# Patient Record
Sex: Female | Born: 1950
Health system: Southern US, Community
[De-identification: ages and names within clinical notes are randomized; demographics above are authoritative.]

## PROBLEM LIST (undated history)

## (undated) DIAGNOSIS — Z9289 Personal history of other medical treatment: Secondary | ICD-10-CM

## (undated) DIAGNOSIS — I10 Essential (primary) hypertension: Secondary | ICD-10-CM

## (undated) DIAGNOSIS — I82409 Acute embolism and thrombosis of unspecified deep veins of unspecified lower extremity: Principal | ICD-10-CM

## (undated) DIAGNOSIS — D6851 Activated protein C resistance: Secondary | ICD-10-CM

## (undated) DIAGNOSIS — K6389 Other specified diseases of intestine: Secondary | ICD-10-CM

## (undated) DIAGNOSIS — L500 Allergic urticaria: Secondary | ICD-10-CM

## (undated) DIAGNOSIS — IMO0002 Reserved for concepts with insufficient information to code with codable children: Secondary | ICD-10-CM

## (undated) DIAGNOSIS — L03116 Cellulitis of left lower limb: Secondary | ICD-10-CM

## (undated) DIAGNOSIS — D689 Coagulation defect, unspecified: Secondary | ICD-10-CM

## (undated) DIAGNOSIS — C49A0 Gastrointestinal stromal tumor, unspecified site: Secondary | ICD-10-CM

## (undated) DIAGNOSIS — E538 Deficiency of other specified B group vitamins: Secondary | ICD-10-CM

## (undated) DIAGNOSIS — R0600 Dyspnea, unspecified: Secondary | ICD-10-CM

## (undated) DIAGNOSIS — F419 Anxiety disorder, unspecified: Secondary | ICD-10-CM

## (undated) DIAGNOSIS — D510 Vitamin B12 deficiency anemia due to intrinsic factor deficiency: Secondary | ICD-10-CM

## (undated) DIAGNOSIS — I472 Ventricular tachycardia: Secondary | ICD-10-CM

## (undated) DIAGNOSIS — K219 Gastro-esophageal reflux disease without esophagitis: Secondary | ICD-10-CM

## (undated) HISTORY — DX: Coagulation defect, unspecified: D68.9

## (undated) HISTORY — DX: Deficiency of other specified B group vitamins: E53.8

## (undated) HISTORY — DX: Activated protein C resistance: D68.51

## (undated) HISTORY — DX: Gastrointestinal stromal tumor, unspecified site: C49.A0

## (undated) HISTORY — DX: Vitamin B12 deficiency anemia due to intrinsic factor deficiency: D51.0

## (undated) HISTORY — DX: Cellulitis of left lower limb: L03.116

## (undated) HISTORY — DX: Acute embolism and thrombosis of unspecified deep veins of unspecified lower extremity: I82.409

## (undated) HISTORY — DX: Reserved for concepts with insufficient information to code with codable children: IMO0002

---

## 1987-01-27 HISTORY — PX: ABDOMINAL HYSTERECTOMY: SHX81

## 2004-01-27 DIAGNOSIS — L03116 Cellulitis of left lower limb: Secondary | ICD-10-CM

## 2004-01-27 HISTORY — DX: Cellulitis of left lower limb: L03.116

## 2004-04-08 ENCOUNTER — Encounter: Admission: RE | Admit: 2004-04-08 | Discharge: 2004-04-08 | Payer: Self-pay | Admitting: Oncology

## 2004-04-08 ENCOUNTER — Encounter (HOSPITAL_COMMUNITY): Admission: RE | Admit: 2004-04-08 | Discharge: 2004-05-08 | Payer: Self-pay | Admitting: Oncology

## 2004-04-08 ENCOUNTER — Ambulatory Visit (HOSPITAL_COMMUNITY): Payer: Self-pay | Admitting: Oncology

## 2004-05-01 ENCOUNTER — Observation Stay (HOSPITAL_COMMUNITY): Admission: RE | Admit: 2004-05-01 | Discharge: 2004-05-02 | Payer: Self-pay | Admitting: Family Medicine

## 2004-05-16 ENCOUNTER — Encounter (HOSPITAL_COMMUNITY): Admission: RE | Admit: 2004-05-16 | Discharge: 2004-06-15 | Payer: Self-pay | Admitting: Oncology

## 2004-05-16 ENCOUNTER — Encounter: Admission: RE | Admit: 2004-05-16 | Discharge: 2004-05-16 | Payer: Self-pay | Admitting: Oncology

## 2004-05-30 ENCOUNTER — Ambulatory Visit (HOSPITAL_COMMUNITY): Payer: Self-pay | Admitting: Oncology

## 2004-06-19 ENCOUNTER — Encounter (HOSPITAL_COMMUNITY): Admission: RE | Admit: 2004-06-19 | Discharge: 2004-07-19 | Payer: Self-pay | Admitting: Oncology

## 2004-06-19 ENCOUNTER — Encounter: Admission: RE | Admit: 2004-06-19 | Discharge: 2004-06-19 | Payer: Self-pay | Admitting: Oncology

## 2004-07-15 ENCOUNTER — Emergency Department (HOSPITAL_COMMUNITY): Admission: EM | Admit: 2004-07-15 | Discharge: 2004-07-16 | Payer: Self-pay | Admitting: *Deleted

## 2004-07-23 ENCOUNTER — Encounter (HOSPITAL_COMMUNITY): Admission: RE | Admit: 2004-07-23 | Discharge: 2004-08-22 | Payer: Self-pay | Admitting: Oncology

## 2004-07-23 ENCOUNTER — Ambulatory Visit (HOSPITAL_COMMUNITY): Payer: Self-pay | Admitting: Oncology

## 2004-07-23 ENCOUNTER — Encounter: Admission: RE | Admit: 2004-07-23 | Discharge: 2004-07-23 | Payer: Self-pay | Admitting: Oncology

## 2004-08-27 ENCOUNTER — Encounter (HOSPITAL_COMMUNITY): Admission: RE | Admit: 2004-08-27 | Discharge: 2004-09-26 | Payer: Self-pay | Admitting: Oncology

## 2004-08-27 ENCOUNTER — Encounter: Admission: RE | Admit: 2004-08-27 | Discharge: 2004-08-27 | Payer: Self-pay | Admitting: Oncology

## 2004-09-09 ENCOUNTER — Ambulatory Visit (HOSPITAL_COMMUNITY): Payer: Self-pay | Admitting: Oncology

## 2004-10-01 ENCOUNTER — Encounter: Admission: RE | Admit: 2004-10-01 | Discharge: 2004-10-25 | Payer: Self-pay | Admitting: Oncology

## 2004-10-01 ENCOUNTER — Encounter (HOSPITAL_COMMUNITY): Admission: RE | Admit: 2004-10-01 | Discharge: 2004-10-25 | Payer: Self-pay | Admitting: Oncology

## 2004-10-27 ENCOUNTER — Emergency Department (HOSPITAL_COMMUNITY): Admission: EM | Admit: 2004-10-27 | Discharge: 2004-10-27 | Payer: Self-pay | Admitting: Emergency Medicine

## 2004-10-28 ENCOUNTER — Inpatient Hospital Stay (HOSPITAL_COMMUNITY): Admission: EM | Admit: 2004-10-28 | Discharge: 2004-11-01 | Payer: Self-pay | Admitting: Emergency Medicine

## 2004-11-07 ENCOUNTER — Ambulatory Visit (HOSPITAL_COMMUNITY): Payer: Self-pay | Admitting: Oncology

## 2004-11-07 ENCOUNTER — Encounter (HOSPITAL_COMMUNITY): Admission: RE | Admit: 2004-11-07 | Discharge: 2004-12-07 | Payer: Self-pay | Admitting: Oncology

## 2004-11-07 ENCOUNTER — Encounter: Admission: RE | Admit: 2004-11-07 | Discharge: 2004-11-07 | Payer: Self-pay | Admitting: Oncology

## 2004-12-08 ENCOUNTER — Encounter (HOSPITAL_COMMUNITY): Admission: RE | Admit: 2004-12-08 | Discharge: 2005-01-07 | Payer: Self-pay | Admitting: Oncology

## 2004-12-08 ENCOUNTER — Encounter: Admission: RE | Admit: 2004-12-08 | Discharge: 2004-12-08 | Payer: Self-pay | Admitting: Oncology

## 2004-12-31 ENCOUNTER — Ambulatory Visit (HOSPITAL_COMMUNITY): Payer: Self-pay | Admitting: Oncology

## 2005-01-30 ENCOUNTER — Encounter (HOSPITAL_COMMUNITY): Admission: RE | Admit: 2005-01-30 | Discharge: 2005-03-01 | Payer: Self-pay | Admitting: Oncology

## 2005-01-30 ENCOUNTER — Encounter: Admission: RE | Admit: 2005-01-30 | Discharge: 2005-01-30 | Payer: Self-pay | Admitting: Oncology

## 2005-03-05 ENCOUNTER — Ambulatory Visit (HOSPITAL_COMMUNITY): Payer: Self-pay | Admitting: Oncology

## 2005-03-05 ENCOUNTER — Encounter: Admission: RE | Admit: 2005-03-05 | Discharge: 2005-03-05 | Payer: Self-pay | Admitting: Oncology

## 2005-03-05 ENCOUNTER — Encounter (HOSPITAL_COMMUNITY): Admission: RE | Admit: 2005-03-05 | Discharge: 2005-04-04 | Payer: Self-pay | Admitting: Oncology

## 2005-05-18 ENCOUNTER — Encounter (HOSPITAL_COMMUNITY): Admission: RE | Admit: 2005-05-18 | Discharge: 2005-06-17 | Payer: Self-pay | Admitting: Oncology

## 2005-05-18 ENCOUNTER — Encounter: Admission: RE | Admit: 2005-05-18 | Discharge: 2005-05-18 | Payer: Self-pay | Admitting: Oncology

## 2005-05-25 ENCOUNTER — Ambulatory Visit (HOSPITAL_COMMUNITY): Payer: Self-pay | Admitting: Oncology

## 2005-07-06 ENCOUNTER — Encounter: Admission: RE | Admit: 2005-07-06 | Discharge: 2005-07-06 | Payer: Self-pay | Admitting: Oncology

## 2005-07-06 ENCOUNTER — Encounter (HOSPITAL_COMMUNITY): Admission: RE | Admit: 2005-07-06 | Discharge: 2005-08-05 | Payer: Self-pay | Admitting: Oncology

## 2005-08-07 ENCOUNTER — Encounter (HOSPITAL_COMMUNITY): Admission: RE | Admit: 2005-08-07 | Discharge: 2005-09-06 | Payer: Self-pay | Admitting: Oncology

## 2005-08-07 ENCOUNTER — Encounter: Admission: RE | Admit: 2005-08-07 | Discharge: 2005-08-07 | Payer: Self-pay | Admitting: Oncology

## 2005-08-07 ENCOUNTER — Ambulatory Visit (HOSPITAL_COMMUNITY): Payer: Self-pay | Admitting: Oncology

## 2005-09-04 ENCOUNTER — Ambulatory Visit (HOSPITAL_COMMUNITY): Payer: Self-pay | Admitting: Oncology

## 2005-09-08 ENCOUNTER — Encounter: Admission: RE | Admit: 2005-09-08 | Discharge: 2005-09-08 | Payer: Self-pay | Admitting: Oncology

## 2005-09-08 ENCOUNTER — Encounter (HOSPITAL_COMMUNITY): Admission: RE | Admit: 2005-09-08 | Discharge: 2005-10-08 | Payer: Self-pay | Admitting: Oncology

## 2005-11-04 ENCOUNTER — Encounter: Admission: RE | Admit: 2005-11-04 | Discharge: 2005-11-04 | Payer: Self-pay | Admitting: Oncology

## 2005-11-04 ENCOUNTER — Ambulatory Visit (HOSPITAL_COMMUNITY): Payer: Self-pay | Admitting: Oncology

## 2005-11-04 ENCOUNTER — Encounter (HOSPITAL_COMMUNITY): Admission: RE | Admit: 2005-11-04 | Discharge: 2005-12-04 | Payer: Self-pay | Admitting: Oncology

## 2006-02-26 ENCOUNTER — Ambulatory Visit (HOSPITAL_COMMUNITY): Payer: Self-pay | Admitting: Oncology

## 2006-02-26 ENCOUNTER — Encounter (HOSPITAL_COMMUNITY): Admission: RE | Admit: 2006-02-26 | Discharge: 2006-03-28 | Payer: Self-pay | Admitting: Oncology

## 2006-08-25 ENCOUNTER — Ambulatory Visit (HOSPITAL_COMMUNITY): Payer: Self-pay | Admitting: Oncology

## 2006-08-25 ENCOUNTER — Encounter (HOSPITAL_COMMUNITY): Admission: RE | Admit: 2006-08-25 | Discharge: 2006-09-24 | Payer: Self-pay | Admitting: Oncology

## 2006-10-05 ENCOUNTER — Encounter (HOSPITAL_COMMUNITY): Admission: RE | Admit: 2006-10-05 | Discharge: 2006-10-26 | Payer: Self-pay | Admitting: Oncology

## 2006-10-18 ENCOUNTER — Ambulatory Visit (HOSPITAL_COMMUNITY): Payer: Self-pay | Admitting: Oncology

## 2006-11-05 ENCOUNTER — Encounter (HOSPITAL_COMMUNITY): Admission: RE | Admit: 2006-11-05 | Discharge: 2006-12-05 | Payer: Self-pay | Admitting: Oncology

## 2006-12-07 ENCOUNTER — Ambulatory Visit (HOSPITAL_COMMUNITY): Payer: Self-pay | Admitting: Oncology

## 2006-12-07 ENCOUNTER — Encounter (HOSPITAL_COMMUNITY): Admission: RE | Admit: 2006-12-07 | Discharge: 2007-01-06 | Payer: Self-pay | Admitting: Oncology

## 2007-05-20 ENCOUNTER — Emergency Department (HOSPITAL_COMMUNITY): Admission: EM | Admit: 2007-05-20 | Discharge: 2007-05-20 | Payer: Self-pay | Admitting: Emergency Medicine

## 2009-05-20 ENCOUNTER — Encounter (HOSPITAL_COMMUNITY): Admission: RE | Admit: 2009-05-20 | Discharge: 2009-06-19 | Payer: Self-pay | Admitting: Oncology

## 2009-05-20 ENCOUNTER — Ambulatory Visit (HOSPITAL_COMMUNITY): Payer: Self-pay | Admitting: Oncology

## 2009-05-26 DIAGNOSIS — IMO0002 Reserved for concepts with insufficient information to code with codable children: Secondary | ICD-10-CM

## 2009-05-26 HISTORY — DX: Reserved for concepts with insufficient information to code with codable children: IMO0002

## 2009-06-07 ENCOUNTER — Ambulatory Visit: Payer: Self-pay | Admitting: Internal Medicine

## 2009-06-07 ENCOUNTER — Inpatient Hospital Stay (HOSPITAL_COMMUNITY): Admission: EM | Admit: 2009-06-07 | Discharge: 2009-06-10 | Payer: Self-pay | Admitting: Emergency Medicine

## 2009-06-08 ENCOUNTER — Ambulatory Visit: Payer: Self-pay | Admitting: Internal Medicine

## 2009-06-11 ENCOUNTER — Telehealth (INDEPENDENT_AMBULATORY_CARE_PROVIDER_SITE_OTHER): Payer: Self-pay

## 2009-06-11 ENCOUNTER — Encounter: Payer: Self-pay | Admitting: Internal Medicine

## 2009-06-11 DIAGNOSIS — K922 Gastrointestinal hemorrhage, unspecified: Secondary | ICD-10-CM | POA: Insufficient documentation

## 2009-06-12 ENCOUNTER — Encounter: Payer: Self-pay | Admitting: Internal Medicine

## 2009-06-12 ENCOUNTER — Telehealth (INDEPENDENT_AMBULATORY_CARE_PROVIDER_SITE_OTHER): Payer: Self-pay

## 2009-06-13 ENCOUNTER — Encounter (INDEPENDENT_AMBULATORY_CARE_PROVIDER_SITE_OTHER): Payer: Self-pay

## 2009-06-17 ENCOUNTER — Telehealth (INDEPENDENT_AMBULATORY_CARE_PROVIDER_SITE_OTHER): Payer: Self-pay

## 2009-06-18 ENCOUNTER — Encounter: Payer: Self-pay | Admitting: Internal Medicine

## 2009-06-18 ENCOUNTER — Telehealth (INDEPENDENT_AMBULATORY_CARE_PROVIDER_SITE_OTHER): Payer: Self-pay

## 2009-06-19 ENCOUNTER — Encounter: Payer: Self-pay | Admitting: Internal Medicine

## 2009-06-20 ENCOUNTER — Encounter: Payer: Self-pay | Admitting: Internal Medicine

## 2009-06-20 LAB — CONVERTED CEMR LAB
Basophils Absolute: 0 10*3/uL (ref 0.0–0.1)
Basophils Relative: 0 % (ref 0–1)
Eosinophils Absolute: 0.2 10*3/uL (ref 0.0–0.7)
Eosinophils Relative: 3 % (ref 0–5)
HCT: 31.3 % — ABNORMAL LOW (ref 36.0–46.0)
Hemoglobin: 9.5 g/dL — ABNORMAL LOW (ref 12.0–15.0)
Lymphocytes Relative: 34 % (ref 12–46)
Lymphs Abs: 2.2 10*3/uL (ref 0.7–4.0)
MCHC: 30.4 g/dL (ref 30.0–36.0)
MCV: 86.7 fL (ref 78.0–100.0)
Monocytes Absolute: 0.6 10*3/uL (ref 0.1–1.0)
Monocytes Relative: 9 % (ref 3–12)
Neutro Abs: 3.4 10*3/uL (ref 1.7–7.7)
Neutrophils Relative %: 53 % (ref 43–77)
Platelets: 407 10*3/uL — ABNORMAL HIGH (ref 150–400)
RBC: 3.61 M/uL — ABNORMAL LOW (ref 3.87–5.11)
RDW: 14.1 % (ref 11.5–15.5)
WBC: 6.3 10*3/uL (ref 4.0–10.5)

## 2009-06-27 ENCOUNTER — Encounter (INDEPENDENT_AMBULATORY_CARE_PROVIDER_SITE_OTHER): Payer: Self-pay

## 2009-07-02 ENCOUNTER — Encounter (HOSPITAL_COMMUNITY): Admission: RE | Admit: 2009-07-02 | Discharge: 2009-08-01 | Payer: Self-pay | Admitting: Oncology

## 2009-07-05 ENCOUNTER — Ambulatory Visit: Payer: Self-pay | Admitting: Internal Medicine

## 2009-07-05 DIAGNOSIS — K210 Gastro-esophageal reflux disease with esophagitis: Secondary | ICD-10-CM

## 2009-07-08 ENCOUNTER — Ambulatory Visit: Payer: Self-pay | Admitting: Internal Medicine

## 2009-07-12 ENCOUNTER — Ambulatory Visit (HOSPITAL_COMMUNITY): Payer: Self-pay | Admitting: Oncology

## 2009-07-15 ENCOUNTER — Encounter: Payer: Self-pay | Admitting: Internal Medicine

## 2009-07-16 ENCOUNTER — Encounter: Payer: Self-pay | Admitting: Internal Medicine

## 2009-07-18 ENCOUNTER — Encounter (INDEPENDENT_AMBULATORY_CARE_PROVIDER_SITE_OTHER): Payer: Self-pay

## 2009-08-06 ENCOUNTER — Encounter: Payer: Self-pay | Admitting: Internal Medicine

## 2009-08-15 ENCOUNTER — Encounter (HOSPITAL_COMMUNITY): Admission: RE | Admit: 2009-08-15 | Discharge: 2009-09-14 | Payer: Self-pay | Admitting: Oncology

## 2009-08-27 ENCOUNTER — Ambulatory Visit: Payer: Self-pay | Admitting: Internal Medicine

## 2009-08-27 DIAGNOSIS — R635 Abnormal weight gain: Secondary | ICD-10-CM | POA: Insufficient documentation

## 2009-08-27 DIAGNOSIS — D682 Hereditary deficiency of other clotting factors: Secondary | ICD-10-CM

## 2009-08-27 DIAGNOSIS — D509 Iron deficiency anemia, unspecified: Secondary | ICD-10-CM

## 2009-09-02 ENCOUNTER — Ambulatory Visit (HOSPITAL_COMMUNITY): Payer: Self-pay | Admitting: Oncology

## 2009-09-02 LAB — CONVERTED CEMR LAB
HCT: 37.8 % (ref 36.0–46.0)
Hemoglobin: 11.5 g/dL — ABNORMAL LOW (ref 12.0–15.0)
TSH: 1.138 microintl units/mL (ref 0.350–4.500)
Tissue Transglutaminase Ab, IgA: 23.6 units — ABNORMAL HIGH (ref <20)

## 2009-09-16 ENCOUNTER — Encounter (HOSPITAL_COMMUNITY): Admission: RE | Admit: 2009-09-16 | Discharge: 2009-10-16 | Payer: Self-pay | Admitting: Oncology

## 2009-09-20 ENCOUNTER — Telehealth: Payer: Self-pay | Admitting: Internal Medicine

## 2009-09-20 ENCOUNTER — Encounter: Payer: Self-pay | Admitting: Internal Medicine

## 2009-10-17 ENCOUNTER — Encounter (HOSPITAL_COMMUNITY): Admission: RE | Admit: 2009-10-17 | Discharge: 2009-10-25 | Payer: Self-pay | Admitting: Oncology

## 2009-10-30 ENCOUNTER — Ambulatory Visit (HOSPITAL_COMMUNITY): Payer: Self-pay | Admitting: Oncology

## 2009-10-30 ENCOUNTER — Encounter (HOSPITAL_COMMUNITY)
Admission: RE | Admit: 2009-10-30 | Discharge: 2009-11-29 | Payer: Self-pay | Source: Home / Self Care | Admitting: Oncology

## 2009-12-10 ENCOUNTER — Encounter (HOSPITAL_COMMUNITY)
Admission: RE | Admit: 2009-12-10 | Discharge: 2010-01-09 | Payer: Self-pay | Source: Home / Self Care | Attending: Oncology | Admitting: Oncology

## 2009-12-24 ENCOUNTER — Ambulatory Visit (HOSPITAL_COMMUNITY): Payer: Self-pay | Admitting: Oncology

## 2010-01-15 ENCOUNTER — Encounter (HOSPITAL_COMMUNITY)
Admission: RE | Admit: 2010-01-15 | Discharge: 2010-02-14 | Payer: Self-pay | Source: Home / Self Care | Attending: Oncology | Admitting: Oncology

## 2010-02-06 ENCOUNTER — Encounter: Payer: Self-pay | Admitting: Orthopedic Surgery

## 2010-02-10 LAB — PROTIME-INR
INR: 2.1 — ABNORMAL HIGH (ref 0.00–1.49)
Prothrombin Time: 23.7 seconds — ABNORMAL HIGH (ref 11.6–15.2)

## 2010-02-16 ENCOUNTER — Encounter (HOSPITAL_COMMUNITY): Payer: Self-pay | Admitting: Oncology

## 2010-02-19 ENCOUNTER — Emergency Department (HOSPITAL_COMMUNITY)
Admission: EM | Admit: 2010-02-19 | Discharge: 2010-02-19 | Payer: Self-pay | Source: Home / Self Care | Admitting: Emergency Medicine

## 2010-02-25 ENCOUNTER — Encounter: Payer: Self-pay | Admitting: Orthopedic Surgery

## 2010-02-25 ENCOUNTER — Ambulatory Visit (HOSPITAL_COMMUNITY)
Admission: RE | Admit: 2010-02-25 | Discharge: 2010-02-25 | Payer: Self-pay | Source: Home / Self Care | Attending: Oncology | Admitting: Oncology

## 2010-02-25 ENCOUNTER — Ambulatory Visit
Admission: RE | Admit: 2010-02-25 | Discharge: 2010-02-25 | Payer: Self-pay | Source: Home / Self Care | Attending: Orthopedic Surgery | Admitting: Orthopedic Surgery

## 2010-02-25 DIAGNOSIS — S40029A Contusion of unspecified upper arm, initial encounter: Secondary | ICD-10-CM | POA: Insufficient documentation

## 2010-02-25 LAB — PROTIME-INR
INR: 2.62 — ABNORMAL HIGH (ref 0.00–1.49)
Prothrombin Time: 28.1 seconds — ABNORMAL HIGH (ref 11.6–15.2)

## 2010-02-25 NOTE — Miscellaneous (Signed)
Summary: Orders Update  Clinical Lists Changes  Orders: Added new Test order of T-CBC w/Diff ([id number]) - Signed 

## 2010-02-25 NOTE — Progress Notes (Signed)
----   Converted from flag ---- ---- 09/20/2009 2:58 PM, Diana Eves wrote: Dr Thornton Papas office had [facility] for [place] to cancel Naja Trentham's TCS for Monday and that Dr Mariel Sleet needed to talk with you ASAP regarding patient. You can reach him at [phone] ------------------------------

## 2010-02-25 NOTE — Progress Notes (Signed)
Summary: pt weak and tired  Phone Note Call from Patient Call back at [facility] Phone [phone]   Caller: Patient Summary of Call: call from Dr. Arnell Asal office- pt called them c/o feeling weak and tired. Called pt- she restarted coumadin on Saturday. She has not noticed any blood or black stools, no pain, no fever. pt stated she gets up and after about 2 hours has to lay down again. pt is worried that hemoglobin is low. please advise. ( FYI-So far she is doing great with pepcid, no allergic reaction) Initial call taken by: Hendricks Limes LPN,  Jun 18, 2009 11:06 AM     Appended Document: pt weak and tired symptoms nonspecific; would be reasonable to do a cbc today  Appended Document: pt weak and tired tried to call pt- #busy. lab order faxed to lab  Appended Document: pt weak and tired called pt- LMOM  Appended Document: pt weak and tired tried to call pt- #busy  Appended Document: pt weak and tired pt aware. cbc in emr
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
## 2010-02-25 NOTE — Assessment & Plan Note (Signed)
Summary: DROPPED OFF STOOL/SS  pt returned ifobt and it was negative  Allergies: 1)  ! Sulfa 2)  ! Penicillin 3)  ! Keflex 4)  ! * Latex 5)  ! * Adhesive 6)  ! Pantoprazole Sodium 7)  ! * Dexilant  Other Orders: Immuno-chemical Fecal Occult (16109)

## 2010-02-25 NOTE — Consult Note (Signed)
Summary: Consultation Report  Consultation Report   Imported By: Peggyann Shoals 06/12/2009 11:17:39  _____________________________________________________________________  External Attachment:    Type:   Image     Comment:   External Document

## 2010-02-25 NOTE — Letter (Signed)
Summary: TCS/EGD ORDERS  TCS/EGD ORDERS   Imported By: Rosine Beat 09/20/2009 12:45:03  _____________________________________________________________________  External Attachment:    Type:   Image     Comment:   External Document

## 2010-02-25 NOTE — Assessment & Plan Note (Signed)
Summary: follow up from EGD done in hospital- cdg   Visit Type:  Follow-up Visit Primary Care Provider:  Belva Agee  Chief Complaint:  F/U EGD.  History of Present Illness: 60 year old lady recently admitted with a GI bleed manifested as melena. She had reflux esophagitis with an ulcer in distal esophagus felt to be at least, in part, responsible for the bleed. Hemoglobin has been slow to rebound on Niferex. I understand was 9.4 through  Dr. Thornton Papas office recently. She is not having any more melena or hematochezia; she is chronically fatigued. She had a 6 unit bleed in [place], [place] 2006 underwent multiple colonoscopies without a lesion being found. She is back on Coumadin.  She is now craving ice.  Interestingly, she has developed an allergic reaction to multiple PPI  given to treat reflux. PPIs are basically off limits now. She is on Pepcid 20 mg orally twice daily; she is not having any reflux symptoms. She had a prominent Schatzki's ring which warrantts dilation at some point in the future although she's not having any dysphagia currently.    Of note, her serum IgA level came back normal while she was in the hospital. I do not have the celiac panel available for review at this time.    Current Medications (verified): 1)  Niferex 150 Mg .... Take 1 Tablet By Mouth Two Times A Day 2)  Coumadin 7.5 Mg Tabs (Warfarin Sodium) .... One Tablet Daily Mon-Fri 3)  Coumadin 5 Mg Tabs (Warfarin Sodium) .... One Tablet On Sat and Sun 4)  Folic Acid 1 Mg Tabs (Folic Acid) .... Take 1 Tablet By Mouth Once A Day 5)  Alprazolam 0.25 Mg Tabs (Alprazolam) .... Take 1 Tablet By Mouth Once A Day 6)  B12 Injection .... Once Monthly 7)  Pepcid 20 Mg Tabs (Famotidine) .... Take 1 Tablet By Mouth Two Times A Day  Allergies (verified): 1)  ! Sulfa 2)  ! Penicillin 3)  ! Keflex 4)  ! * Latex 5)  ! * Adhesive 6)  ! Pantoprazole Sodium 7)  ! * Dexilant  Past History:  Family History: Last  updated: 07/05/2009 Father: Living age 30  healthy Mother: Deceased age 10  Parkinson's Siblings: 4      healthy  Social History: Last updated: 07/05/2009 Marital Status: Married Children: 3 Occupation: Art gallery manager  Past Medical History: Blood clots in left leg (when had hysterectomy) Factor Five Anxiety Reflux Hiatal Hernia  Past Surgical History: Hysterectomy  Family History: Father: Living age 70  healthy Mother: Deceased age 8  Parkinson's Siblings: 4      healthy  Social History: Marital Status: Married Children: 3 Occupation: Art gallery manager  Vital Signs:  Patient profile:   59 year old female Height:      67.5 inches Weight:      242 pounds BMI:     37.48 Temp:     97.8 degrees F oral Pulse rate:   72 / minute BP sitting:   142 / 82  (left arm) Cuff size:   regular  Vitals Entered By: Cloria Spring LPN (July 05, 2009 2:00 PM)  Physical Exam  General:  alert pleasant lady in no acute distress Eyes:  and gingiva paleness no scleral icterus Lungs:  clear to auscultation Heart:  regular rate and rhythm without murmur gallop rub Abdomen:  non-distended, soft, nontender without mass or hepatosplenomegaly  Impression & Recommendations: Impression: Pleasant 60 year old lady chronically anticoagulated because of factor 5 Leiden deficiency  admitted with GI bleed recently. She had ulcerative reflux esophagitis. Her hemoglobin has been slow to rebound. It's been 5 years since she had her colon imaged. She's never had her small bowel imaged. Of note, she was taking nonsteroidal agents up until the time of her bleed recently which may have been a contributing factor. She could have a lesion in her colon or small bowel contributing to the clinical picture.  Recommendations: Continue Pepcid 20 mg orally twice daily indefinitely  We'll send her home with one immunofecal occult blood test kit; if stool is  positive, we will pursue colonoscopy right away. It's  negative we'll see her back and see how her hemoglobin is doing over the next one month. Frankly, I have a low threshold for checking her colon since his been 5 years since that was done. She may ultimately end of the capsule study of her small bowel as well.  Review celiac panel.  Appended Document: follow up from EGD done in hospital- cdg stool negative for blood now; pt needs cbc and ov w extender in about 1 month from now to set up tcs  Appended Document: follow up from EGD done in hospital- cdg pt aware, lab order on file  Appended Document: Orders Update    Clinical Lists Changes  Problems: Added new problem of REFLUX ESOPHAGITIS (ICD-530.11) Orders: Added new Service order of Est. Patient Level III (16109) - Signed      Appended Document: follow up from EGD done in hospital- cdg reminder in computer

## 2010-02-25 NOTE — Miscellaneous (Signed)
Summary: op note  Clinical Lists Changes NAMESHLOKA, [name]               ACCOUNT NO.:  [id number]      MEDICAL RECORD NO.:  [id number]          PATIENT TYPE:  INP      LOCATION:  A304                          FACILITY:  [facility]      PHYSICIAN:  R. Roetta Sessions, M.D. DATE OF BIRTH:  08-Jul-1950      DATE OF PROCEDURE:  06/07/2009   DATE OF DISCHARGE:                                  OPERATIVE REPORT      PROCEDURE:  Diagnostic esophagogastroduodenoscopy.      INDICATIONS FOR PROCEDURE:  This is a very pleasant 60 year old lady   admitted to the hospital with melena and a precipitous drop in   hemoglobin in the setting of Advil use and anticoagulation with   Coumadin.  Overnight, she has remained hemodynamically stable.      Hemoglobin dropped from 10.8 to 9.4 overnight; it was 13.1 on May 20, 2009.  She has remained hemodynamically stable.  She was given FFP last   night and developed some problems felt to be a transfusion reaction and   the transfusion was stopped after approximately one-half of 1 unit was   administered this morning.  However, INR is at 1.48.  She remains   hemodynamically stable.  It is notable that this lady has a long history   of intermittent esophageal dysphagia to solids.  I told this nice lady I   may or may not be able to dilate her esophagus today.  The risks,   benefits, alternatives, limitations and imponderables have been   discussed and questions answered.  Please see the documentation in the   medical record.      PROCEDURE NOTE:  O2 saturation, blood pressure, pulse and respirations   were monitored throughout the entire procedure.      CONSCIOUS SEDATION:  Versed 3 mg IV and Demerol 50 mg IV in divided   doses.      INSTRUMENT:  Pentax video chip system.      Cetacaine spray for topical pharyngeal anesthesia.      FINDINGS:  Examination of the tubular esophagus revealed a single,   large, V-shaped erosion coming up from the GE junction.   This was   superimposed on a beefy Schatzki's ring.      In the central portion of the area of erosion, there was ulceration and   excavation of the mucosa, consistent with a small ulcer straddling the   GE junction directly at the level of the ring.  The ulcer had a clean   base.  Please see photos.  The EG junction was easily traversed, noting   the stomach .  Gastric cavity was empty and insufflated well with air.   A thorough examination of the gastric mucosa including retroflexion of   the proximal stomach and esophagogastric junction demonstrated a   moderate-sized hiatal hernia.  Again, the stomach was empty.  The mucosa   was well seen and it appeared otherwise normal.  The pylorus was patent   and easily traversed .  Examination of the bulb second and third portion   revealed no abnormalities.      Therapeutic/diagnostic maneuver performed:  None.      The patient tolerated the procedure well and was reactive after   endoscopy.      IMPRESSION:   1. A relatively large area of distal esophageal erosion with a central       area of ulceration, consistent with ulcerative/erosive reflux       esophagitis (this area could have bled to the point of producing       melena recently).   2. A thick, beefy Schatzki's ring, not manipulated today.   3. A moderate-sized hiatal hernia, empty stomach, normal gastric       mucosa patent pylorus, normal D1-D3.      RECOMMENDATIONS:   1. A clear liquid diet, advance to a soft diet over the next 24 hours.   2. Continue on a b.i.d. proton pump inhibitor.   3. I would avoid all NSAIDs from here on out.   4. Hold Coumadin for 7 days and then after 7 days, I would cautiously       resume this agent as the benefits would outweigh the risk at that       point.   5. We will follow H and H over the next 24 hours.   6. I have recommended that this nice lady come back at a later date       for an elective EGD with dilation of her Schatzki's ring  (with       Coumadin therapy interrupted at that time).               Jonathon Bellows, M.D.            RMR/MEDQ  D:  06/08/2009  T:  06/08/2009  Job:  [id number]      cc:   [facility]   [place], [place]      Minerva Areola S. Mariel Sleet, MD   Fax: [phone]      Hospitalist Service

## 2010-02-25 NOTE — Progress Notes (Signed)
----   Converted from flag ---- ---- 06/11/2009 10:02 AM, Diana Eves wrote:   ---- 06/11/2009 9:43 AM, Jonathon Bellows MD, Caleen Essex wrote: lets get a cbc end of next week - and if getting better, can cancel 6/10 and go w 3 mos  ---- 06/11/2009 9:30 AM, Diana Eves wrote: pt has appt on 6/10 @3pm  w/RMR...should I cancel this and make it in 3 months w/extender?  ---- 06/11/2009 7:35 AM, R. Roetta Sessions MD, Caleen Essex wrote: needs 3 mo f/u w extender to f/u dysphagia ------------------------------  Appended Document:  Pt informed. Lab order mailed.

## 2010-02-25 NOTE — Miscellaneous (Signed)
Summary: Orders Update  Clinical Lists Changes  Orders: Added new Test order of T-Celiac Disease Ab Evaluation (8002) - Signed 

## 2010-02-25 NOTE — Miscellaneous (Signed)
Summary: Orders Update  Clinical Lists Changes  Problems: Added new problem of GI BLEEDING (ICD-578.9) Orders: Added new Test order of T-CBC w/Diff [phone]) - Signed

## 2010-02-25 NOTE — Letter (Signed)
Summary: Recall, Labs Needed  [facility]  [address]   [place], [place] [address]   Phone: [phone]  Fax: [phone]    July 18, 2009  [name] [address] [place], [place]  [address] [birth year]   Dear [name],   Our records indicate it is time to repeat your blood work.  You can take the enclosed form to the lab on or near the date indicated.  Please make note of the new location of the lab:   [address], 2nd floor   [facility]  Our office will call you within a week to ten business days with the results.  If you do not hear from [place] in 10 business days, you should call the office.  If you have any questions regarding this, call the office at [phone], and ask for the nurse.  Labs are due on 08/21/2009.   Sincerely,    Hendricks Limes LPN  [facility] Ph: [phone]   Fax: [phone]

## 2010-02-25 NOTE — Progress Notes (Signed)
Summary: phone note/ ??allergic reaction to Pantoprazole  Phone Note Call from Patient   Caller: PT'S DAUGHTER IN LAW/ AMY Summary of Call: Pt's daughter-in-law called and said Dr. Jena Gauss saw pt in hospital and prescribed Pantoprazole 40 mg two times a day. She feels pt is having allergic reaction to the medication, she is itching all over and red above her eye lids. This is the only new medication she is on. Please advise. (Uses [facility] in [place]). Initial call taken by: Cloria Spring LPN,  Jun 12, 2009 3:32 PM     Appended Document: phone note/ ??allergic reaction to Pantoprazole stop pantoprazole and begin Prevacid 30 mg daily (disp 30) w 3 refills; use benadryl as needed itching.; call if no better in next 24 hours  Appended Document: phone note/ ??allergic reaction to Pantoprazole Pt informed.  Appended Document: phone note/ ??allergic reaction to Pantoprazole Rx called to [facility], to [facility].  Appended Document: phone note/ ??allergic reaction to Pantoprazole Pt called back and said the pharmacist said the Prevacid is in the same family as the  Pantoprazole, and she is afraid to take it since she had such a bad time last night  with itching. Please advise!  Appended Document: phone note/ ??allergic reaction to Pantoprazole Per Crystal, pt called and was very upset that she has not received a call back about her medication, and that she has an appt here in June but why would she want to come and  see someone who cares nothing for her health, that she has told us what she is allergic to.  Crystal was going to transfer call to me and pt hung up. Please advise on the above info.  Appended Document: phone note/ ??allergic reaction to Pantoprazole spoke w our pharmacist, benny, allergic reacions can occur w ppi's; however, changing to another agent in same class often ok; he recommended Dexilant 60 mg once daily - let give her 2 weeks worth of free samples - warn she  may have a reaction but may well be Ok - this calass of meds is best optiion for her problem=- lets call her in 2 days to check on her progress.  Appended Document: phone note/ ??allergic reaction to Pantoprazole Pt informed. Said she could not get here to pick up samples, she would like it called in to [facility] in [place]. Rx called to C-C at the pharmacy. (Pt did say that her itching is some better, her eyes are swollen but not quiet as bad, she said  she is almost afriad to take anything, but she wanted Rx called in. She should be at work on Monday if she is not at home, work number is [phone].)

## 2010-02-25 NOTE — Letter (Signed)
Summary: labs/dr. Mariel Sleet  labs/dr. neijstrom   Imported By: Rosine Beat 07/15/2009 10:50:24  _____________________________________________________________________  External Attachment:    Type:   Image     Comment:   External Document

## 2010-02-25 NOTE — Progress Notes (Signed)
Summary: pt reacted to the Dexilant also  Phone Note Other Incoming   Caller: Doris Summary of Call: Called pt to see how she is doing today. She picked up  Dexilant on Friday evening. Took it [place] and Sat. She woke  up Sunday AM with swollen eyes and itching all over, so she didn't take it Sunday or today. Please advise! Initial call taken by: Cloria Spring LPN,  Jun 17, 2009 1:58 PM

## 2010-02-25 NOTE — Progress Notes (Signed)
----   Converted from flag ---- ---- 09/20/2009 3:57 PM, R. Roetta Sessions MD, Caleen Essex wrote: I spoke with Dr. Mariel Sleet. Patient has recurrent phlebitis and is on Lovenox for now. He wants to hold off on her colonoscopy. I told him okay but he would give [place] the greenlight when ready to proceed. Will need an EGD as well because of a positive celiac antibody she will need a small bowel biopsy.  ---- 09/20/2009 2:58 PM, Diana Eves wrote: Dr Thornton Papas office had [facility] for [place] to cancel Maxie Maiers's TCS for Monday and that Dr Mariel Sleet needed to talk with you ASAP regarding patient. You can reach him at [phone] ------------------------------

## 2010-02-25 NOTE — Letter (Signed)
Summary: Recall, Labs Needed  [facility]  [address]   [place], [place] [address]   Phone: [phone]  Fax: [phone]    June 27, 2009  [name] [address] [place], [place]  [address] 1950-12-14   Dear [name],   Our records indicate it is time to repeat your blood work.  You can take the enclosed form to the lab on or near the date indicated.  Please make note of the new location of the lab:   [address], 2nd floor   [facility]  Our office will call you within a week to ten business days with the results.  If you do not hear from [place] in 10 business days, you should call the office.  If you have any questions regarding this, call the office at [phone], and ask for the nurse.  Labs are due on 07/22/2009  Sincerely,    Hendricks Limes LPN  [facility] Ph: [phone]   Fax: [phone]

## 2010-02-26 ENCOUNTER — Encounter (HOSPITAL_COMMUNITY): Admission: RE | Admit: 2010-02-26 | Payer: Self-pay | Source: Home / Self Care | Admitting: Oncology

## 2010-03-05 NOTE — Letter (Signed)
Summary: History form  History form   Imported By: Jacklynn Ganong 02/27/2010 10:06:08  _____________________________________________________________________  External Attachment:    Type:   Image     Comment:   External Document

## 2010-03-05 NOTE — Letter (Signed)
Summary: Out of Work  [facility]  [address]  [place], [place] [address]   Phone: [phone]  Fax: [phone]    February 25, 2010   Employee:  DAWNITA MOLNER    To Whom It May Concern:   For Medical reasons, please excuse the above named employee from work for the following dates:  Start:   02-24-2010  End:   May return 03-03-2010  If you need additional information, please feel free to contact our office.         Sincerely,    Dr. Terrance Mass
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
## 2010-03-25 ENCOUNTER — Other Ambulatory Visit (HOSPITAL_COMMUNITY): Payer: Medicare Other

## 2010-03-25 ENCOUNTER — Encounter (HOSPITAL_COMMUNITY): Payer: Medicare Other | Attending: Oncology

## 2010-03-25 DIAGNOSIS — Z86718 Personal history of other venous thrombosis and embolism: Secondary | ICD-10-CM | POA: Insufficient documentation

## 2010-03-25 DIAGNOSIS — I82409 Acute embolism and thrombosis of unspecified deep veins of unspecified lower extremity: Secondary | ICD-10-CM

## 2010-03-25 DIAGNOSIS — D6859 Other primary thrombophilia: Secondary | ICD-10-CM | POA: Insufficient documentation

## 2010-03-25 DIAGNOSIS — Z7901 Long term (current) use of anticoagulants: Secondary | ICD-10-CM | POA: Insufficient documentation

## 2010-04-07 LAB — PROTIME-INR
INR: 2.08 — ABNORMAL HIGH (ref 0.00–1.49)
INR: 2.59 — ABNORMAL HIGH (ref 0.00–1.49)
INR: 1.59 — ABNORMAL HIGH (ref 0.00–1.49)
Prothrombin Time: 23.5 seconds — ABNORMAL HIGH (ref 11.6–15.2)
Prothrombin Time: 27.9 seconds — ABNORMAL HIGH (ref 11.6–15.2)
Prothrombin Time: 19.1 seconds — ABNORMAL HIGH (ref 11.6–15.2)

## 2010-04-08 LAB — PROTIME-INR
INR: 1.7 — ABNORMAL HIGH (ref 0.00–1.49)
INR: 2.16 — ABNORMAL HIGH (ref 0.00–1.49)
Prothrombin Time: 20.2 seconds — ABNORMAL HIGH (ref 11.6–15.2)
Prothrombin Time: 24.2 seconds — ABNORMAL HIGH (ref 11.6–15.2)

## 2010-04-09 LAB — CBC
HCT: 35.3 % — ABNORMAL LOW (ref 36.0–46.0)
Hemoglobin: 11.5 g/dL — ABNORMAL LOW (ref 12.0–15.0)
MCH: 25.3 pg — ABNORMAL LOW (ref 26.0–34.0)
MCHC: 32.4 g/dL (ref 30.0–36.0)
MCV: 78 fL (ref 78.0–100.0)
Platelets: 303 10*3/uL (ref 150–400)
RBC: 4.53 MIL/uL (ref 3.87–5.11)
RDW: 21.2 % — ABNORMAL HIGH (ref 11.5–15.5)
WBC: 5.1 10*3/uL (ref 4.0–10.5)

## 2010-04-09 LAB — PROTIME-INR
INR: 2 — ABNORMAL HIGH (ref 0.00–1.49)
INR: 2.57 — ABNORMAL HIGH (ref 0.00–1.49)
Prothrombin Time: 22.8 seconds — ABNORMAL HIGH (ref 11.6–15.2)
Prothrombin Time: 27.7 seconds — ABNORMAL HIGH (ref 11.6–15.2)

## 2010-04-09 LAB — FERRITIN: Ferritin: 16 ng/mL (ref 10–291)

## 2010-04-09 LAB — IRON AND TIBC
Iron: 57 ug/dL (ref 42–135)
Saturation Ratios: 16 % — ABNORMAL LOW (ref 20–55)
TIBC: 348 ug/dL (ref 250–470)
UIBC: 291 ug/dL

## 2010-04-10 LAB — PROTIME-INR
INR: 2.18 — ABNORMAL HIGH (ref 0.00–1.49)
INR: 2.45 — ABNORMAL HIGH (ref 0.00–1.49)
Prothrombin Time: 24.4 seconds — ABNORMAL HIGH (ref 11.6–15.2)
Prothrombin Time: 26.7 seconds — ABNORMAL HIGH (ref 11.6–15.2)

## 2010-04-10 LAB — CBC
HCT: 35.7 % — ABNORMAL LOW (ref 36.0–46.0)
Hemoglobin: 11.3 g/dL — ABNORMAL LOW (ref 12.0–15.0)
MCV: 75.5 fL — ABNORMAL LOW (ref 78.0–100.0)
RBC: 4.73 MIL/uL (ref 3.87–5.11)
RDW: 19.7 % — ABNORMAL HIGH (ref 11.5–15.5)
WBC: 4.4 10*3/uL (ref 4.0–10.5)

## 2010-04-11 LAB — PROTIME-INR
INR: 1.87 — ABNORMAL HIGH (ref 0.00–1.49)
Prothrombin Time: 21.7 seconds — ABNORMAL HIGH (ref 11.6–15.2)

## 2010-04-12 LAB — IRON AND TIBC
Iron: 38 ug/dL — ABNORMAL LOW (ref 42–135)
Saturation Ratios: 10 % — ABNORMAL LOW (ref 20–55)
TIBC: 394 ug/dL (ref 250–470)

## 2010-04-13 LAB — CBC
HCT: 32.8 % — ABNORMAL LOW (ref 36.0–46.0)
Hemoglobin: 10.4 g/dL — ABNORMAL LOW (ref 12.0–15.0)
MCH: 24 pg — ABNORMAL LOW (ref 26.0–34.0)
MCHC: 31.6 g/dL (ref 30.0–36.0)
MCV: 76.1 fL — ABNORMAL LOW (ref 78.0–100.0)
RBC: 4.32 MIL/uL (ref 3.87–5.11)

## 2010-04-13 LAB — PROTIME-INR
INR: 2.23 — ABNORMAL HIGH (ref 0.00–1.49)
INR: 2.37 — ABNORMAL HIGH (ref 0.00–1.49)
Prothrombin Time: 25.7 seconds — ABNORMAL HIGH (ref 11.6–15.2)

## 2010-04-14 LAB — CARDIAC PANEL(CRET KIN+CKTOT+MB+TROPI)
CK, MB: 0.8 ng/mL (ref 0.3–4.0)
Relative Index: INVALID (ref 0.0–2.5)
Total CK: 72 U/L (ref 7–177)
Troponin I: 0.01 ng/mL (ref 0.00–0.06)

## 2010-04-14 LAB — DIFFERENTIAL
Basophils Absolute: 0 10*3/uL (ref 0.0–0.1)
Basophils Absolute: 0 10*3/uL (ref 0.0–0.1)
Basophils Relative: 0 % (ref 0–1)
Basophils Relative: 0 % (ref 0–1)
Eosinophils Absolute: 0.2 10*3/uL (ref 0.0–0.7)
Eosinophils Absolute: 0.2 10*3/uL (ref 0.0–0.7)
Eosinophils Relative: 4 % (ref 0–5)
Eosinophils Relative: 5 % (ref 0–5)
Lymphocytes Relative: 34 % (ref 12–46)
Lymphocytes Relative: 44 % (ref 12–46)
Lymphs Abs: 1.8 10*3/uL (ref 0.7–4.0)
Lymphs Abs: 1.9 10*3/uL (ref 0.7–4.0)
Monocytes Absolute: 0.4 10*3/uL (ref 0.1–1.0)
Monocytes Absolute: 0.6 10*3/uL (ref 0.1–1.0)
Monocytes Relative: 10 % (ref 3–12)
Monocytes Relative: 11 % (ref 3–12)
Neutro Abs: 1.6 10*3/uL — ABNORMAL LOW (ref 1.7–7.7)
Neutro Abs: 2.8 10*3/uL (ref 1.7–7.7)
Neutrophils Relative %: 40 % — ABNORMAL LOW (ref 43–77)
Neutrophils Relative %: 50 % (ref 43–77)

## 2010-04-14 LAB — CBC
HCT: 24.9 % — ABNORMAL LOW (ref 36.0–46.0)
HCT: 26.1 % — ABNORMAL LOW (ref 36.0–46.0)
Hemoglobin: 9.4 g/dL — ABNORMAL LOW (ref 12.0–15.0)
Hemoglobin: 8.6 g/dL — ABNORMAL LOW (ref 12.0–15.0)
Hemoglobin: 8.9 g/dL — ABNORMAL LOW (ref 12.0–15.0)
MCHC: 34.1 g/dL (ref 30.0–36.0)
MCHC: 34.5 g/dL (ref 30.0–36.0)
MCV: 82.8 fL (ref 78.0–100.0)
MCV: 82.8 fL (ref 78.0–100.0)
Platelets: 255 10*3/uL (ref 150–400)
Platelets: 306 10*3/uL (ref 150–400)
RBC: 3.7 MIL/uL — ABNORMAL LOW (ref 3.87–5.11)
RBC: 3.01 MIL/uL — ABNORMAL LOW (ref 3.87–5.11)
RBC: 3.16 MIL/uL — ABNORMAL LOW (ref 3.87–5.11)
RDW: 15.8 % — ABNORMAL HIGH (ref 11.5–15.5)
RDW: 14 % (ref 11.5–15.5)
RDW: 14.2 % (ref 11.5–15.5)
WBC: 4 10*3/uL (ref 4.0–10.5)
WBC: 5.6 10*3/uL (ref 4.0–10.5)

## 2010-04-14 LAB — BASIC METABOLIC PANEL
BUN: 9 mg/dL (ref 6–23)
CO2: 25 mEq/L (ref 19–32)
Calcium: 8.2 mg/dL — ABNORMAL LOW (ref 8.4–10.5)
Chloride: 112 mEq/L (ref 96–112)
Creatinine, Ser: 0.67 mg/dL (ref 0.4–1.2)
GFR calc Af Amer: >60 mL/min (ref >60)
GFR calc non Af Amer: >60 mL/min (ref >60)
Glucose, Bld: 100 mg/dL — ABNORMAL HIGH (ref 70–99)
Potassium: 3.6 mEq/L (ref 3.5–5.1)
Sodium: 140 mEq/L (ref 135–145)

## 2010-04-14 LAB — HEMOGLOBIN AND HEMATOCRIT, BLOOD
HCT: 26.1 % — ABNORMAL LOW (ref 36.0–46.0)
Hemoglobin: 8.8 g/dL — ABNORMAL LOW (ref 12.0–15.0)

## 2010-04-14 LAB — PROTIME-INR
INR: 2.01 — ABNORMAL HIGH (ref 0.00–1.49)
Prothrombin Time: 22.6 seconds — ABNORMAL HIGH (ref 11.6–15.2)

## 2010-04-15 LAB — PROTIME-INR
INR: 1.55 — ABNORMAL HIGH (ref 0.00–1.49)
Prothrombin Time: 17.8 seconds — ABNORMAL HIGH (ref 11.6–15.2)
Prothrombin Time: 18.9 seconds — ABNORMAL HIGH (ref 11.6–15.2)
Prothrombin Time: 28.8 seconds — ABNORMAL HIGH (ref 11.6–15.2)
Prothrombin Time: 20.7 seconds — ABNORMAL HIGH (ref 11.6–15.2)

## 2010-04-15 LAB — CROSSMATCH: ABO/RH(D): A POS

## 2010-04-15 LAB — BASIC METABOLIC PANEL
BUN: 13 mg/dL (ref 6–23)
BUN: 10 mg/dL (ref 6–23)
CO2: 23 mEq/L (ref 19–32)
CO2: 24 mEq/L (ref 19–32)
Calcium: 9 mg/dL (ref 8.4–10.5)
Chloride: 113 mEq/L — ABNORMAL HIGH (ref 96–112)
Creatinine, Ser: 0.58 mg/dL (ref 0.4–1.2)
Creatinine, Ser: 0.65 mg/dL (ref 0.4–1.2)
Creatinine, Ser: 0.71 mg/dL (ref 0.4–1.2)
GFR calc Af Amer: >60 mL/min (ref >60)
GFR calc non Af Amer: >60 mL/min (ref >60)

## 2010-04-15 LAB — CBC
HCT: 31.7 % — ABNORMAL LOW (ref 36.0–46.0)
Platelets: 290 10*3/uL (ref 150–400)
Platelets: 298 10*3/uL (ref 150–400)
RDW: 14.2 % (ref 11.5–15.5)
WBC: 5.4 10*3/uL (ref 4.0–10.5)
WBC: 5.9 10*3/uL (ref 4.0–10.5)

## 2010-04-15 LAB — VITAMIN B12: Vitamin B-12: 313 pg/mL (ref 211–911)

## 2010-04-15 LAB — IRON AND TIBC: UIBC: 336 ug/dL

## 2010-04-15 LAB — RETICULOCYTES
RBC.: 3.81 MIL/uL — ABNORMAL LOW (ref 3.87–5.11)
Retic Ct Pct: 2.3 % (ref 0.4–3.1)

## 2010-04-15 LAB — HEMOGLOBIN AND HEMATOCRIT, BLOOD
HCT: 24.1 % — ABNORMAL LOW (ref 36.0–46.0)
HCT: 24.1 % — ABNORMAL LOW (ref 36.0–46.0)
HCT: 26.5 % — ABNORMAL LOW (ref 36.0–46.0)
HCT: 27.6 % — ABNORMAL LOW (ref 36.0–46.0)
Hemoglobin: 8.3 g/dL — ABNORMAL LOW (ref 12.0–15.0)
Hemoglobin: 8.3 g/dL — ABNORMAL LOW (ref 12.0–15.0)

## 2010-04-15 LAB — PREPARE FRESH FROZEN PLASMA

## 2010-04-22 ENCOUNTER — Encounter (HOSPITAL_COMMUNITY): Payer: Medicare Other | Attending: Oncology

## 2010-04-22 ENCOUNTER — Other Ambulatory Visit (HOSPITAL_COMMUNITY): Payer: Medicare Other

## 2010-04-22 DIAGNOSIS — Z7901 Long term (current) use of anticoagulants: Secondary | ICD-10-CM | POA: Insufficient documentation

## 2010-04-22 DIAGNOSIS — Z86718 Personal history of other venous thrombosis and embolism: Secondary | ICD-10-CM | POA: Insufficient documentation

## 2010-04-22 DIAGNOSIS — D6859 Other primary thrombophilia: Secondary | ICD-10-CM | POA: Insufficient documentation

## 2010-04-22 DIAGNOSIS — I82409 Acute embolism and thrombosis of unspecified deep veins of unspecified lower extremity: Secondary | ICD-10-CM

## 2010-05-20 ENCOUNTER — Encounter (HOSPITAL_COMMUNITY): Payer: Medicare Other | Attending: Oncology

## 2010-05-20 DIAGNOSIS — I82409 Acute embolism and thrombosis of unspecified deep veins of unspecified lower extremity: Secondary | ICD-10-CM

## 2010-05-20 DIAGNOSIS — Z7901 Long term (current) use of anticoagulants: Secondary | ICD-10-CM | POA: Insufficient documentation

## 2010-05-20 DIAGNOSIS — Z86718 Personal history of other venous thrombosis and embolism: Secondary | ICD-10-CM | POA: Insufficient documentation

## 2010-05-20 DIAGNOSIS — D6859 Other primary thrombophilia: Secondary | ICD-10-CM | POA: Insufficient documentation

## 2010-06-13 NOTE — H&P (Signed)
[name], [name]               ACCOUNT NO.:  [id number]   MEDICAL RECORD NO.:  [id number]          PATIENT TYPE:  INP   LOCATION:  A311                          FACILITY:  [facility]   PHYSICIAN:  Vania Rea, M.D. DATE OF BIRTH:  [birth year]   DATE OF ADMISSION:  10/28/2004  DATE OF DISCHARGE:  LH                                HISTORY & PHYSICAL   PRIMARY CARE PHYSICIAN:  Molly Maduro Day, M.D.   CHIEF COMPLAINT:  Pain and swelling of the left leg since 3 days.   HISTORY OF PRESENT ILLNESS:  This is a 60 year old, Caucasian lady with a  history of factor V Leiden chronic thrombophlebitis, on chronic Coumadin  therapy, and B12 deficiency who was treated at this facility 6 months ago  for cellulitis of the left lower extremity.  This cellulitis cleared but for  no clear reason, she started to develop pain and swelling again in the left  lower extremity 3 days ago.  It has gotten progressively worse and has  spread to involve her groin. Her leg is extremely painful.  Her pain is  described as 10/10.  She has not been having fevers.  There is no history of  trauma to the leg.   PAST MEDICAL HISTORY:  1.  Recent cellulitis to the left lower extremity.  2.  Chronic venous stasis of the left lower extremity.  3.  Extensive superficial phlebitis of the left lower extremity.  4.  History of DVT of the left lower extremity about 17 years ago.  5.  History of severe B12 deficiency.  6.  History of factor V Leiden.  7.  History of adrenal incidentaloma in January 2006 with no biochemical      abnormalities found by investigation.   MEDICATIONS:  1.  Vitamin B12 injections twice weekly.  2.  Folic acid daily.  3.  Coumadin 7.5 mg alternating with 8.5 mg last night.   ALLERGIES:  Keflex and sulfa.   SOCIAL HISTORY:  Married for 35 years, denies tobacco or alcohol or illicit  drug use.   FAMILY HISTORY:  Significant for an 83 year old father with no medical  problems.  Her mother had  mental problems and Parkinson's, and also a  history of recurrent blood clots.  She birthed 3 grown children, 1 son has  kidney stones.  She has a brother with thyroid disease.   REVIEW OF SYSTEMS:  Other than noted in the patient review of systems is  negative.   PHYSICAL EXAMINATION:  This is a pleasant, middle-aged, Caucasian lady lying  in bed.  VITAL SIGNS:  Temperature 97, pulse 77, respirations 20, blood pressure  105/67.  Her pain is described as 10/10 before receiving pain medications.  HEENT:  Her pupils are round, equal, and reactive.  Mucous membranes are  pink and anicteric.  She has no cervical or lymphadenopathy.  CHEST:  Is clear to auscultation bilaterally.  CARDIOVASCULAR:  Regular rhythm.  ABDOMEN:  Obese, soft, and non-tender.  EXTREMITIES:  She has extensive cellulitis from the instep up to the calf.  The area is very tender and  tense but because of vascular disturbance, it is  not very swollen and although the inflamed areas are warm, the toes are  cold.  She has a tender inguinal and femoral lymphadenopathy on the left  side.  Her right lower extremity is unremarkable.  CENTRAL NERVOUS SYSTEM:  No focal neurologic deficit.   LABORATORY DATA:  Her white count is 13.2.  It was 15 in the ER yesterday.  Hemoglobin is 12.6, platelets 240.  Absolute neutrophil count is 10.6.  Her  INR is 4.1.  Her sodium is 133, potassium 3.1, chloride 101, CO2 24, glucose  109, BUN 7, creatinine 0.8, calcium 8.4.  Her liver function tests yesterday  were significant for AST of 85, ALT 80, total protein 7.6, and albumin 3.4.   ASSESSMENT:  1.  Acute left lower extremity cellulitis.  2.  Hypokalemia.  3.  Factor V Leiden with chronic superficial phlebitis on chronic      anticoagulation with supra-therapeutic Coumadin level.  4.  Abnormal liver function tests.   PLAN:  As per orders.      Vania Rea, M.D.  Electronically Signed     LC/MEDQ  D:  10/28/2004  T:   10/28/2004  Job:  [id number]   cc:   Alfredia Client, M.D.

## 2010-06-13 NOTE — Discharge Summary (Signed)
[name], [name]               ACCOUNT NO.:  [id number]   MEDICAL RECORD NO.:  [id number]          PATIENT TYPE:  INP   LOCATION:  A311                          FACILITY:  [facility]   PHYSICIAN:  Vania Rea, M.D. DATE OF BIRTH:  1950/03/05   DATE OF ADMISSION:  10/28/2004  DATE OF DISCHARGE:  10/07/2006LH                                 DISCHARGE SUMMARY   Primary care physician, Dr. Molly Maduro Day.  Hematologist, Ladona Horns. Mariel Sleet,  M.D.   DISCHARGE DIAGNOSES:  1.  Severe cellulitis of the left lower extremity, mildly improved.  2.  Chronic venous stasis of the left lower extremity.  3.  History of extensive superficial thrombophlebitis of the left lower      extremity.  4.  History of heterozygous factor V Leiden on chronic Coumadin therapy.  5.  Supertherapeutic INR.  6.  History of adrenal adenoma, stable.  7.  History of severe B12 deficiency.   DISPOSITION:  Discharged to home.   DISCHARGE CONDITION:  Stable.   DISCHARGE MEDICATIONS:  1.  Vancomycin 1500 mg IV twice daily.  2.  Levaquin 500 mg IV daily.  Duration to be determined by primary      physicians.  3.  Flora-Q one capsule daily for 10 days.  4.  Folic acid 1 mg daily.  5.  Cyanocobalamin 2000 mg p.o. daily.  6.  Percocet 5/325 mg one to two tablets every six hours as needed, 30      tablets prescribed.   SPECIAL INVESTIGATIONS THIS ADMISSION:  1.  Ultrasound of the left lower extremity revealed:  (1) No acute      thrombosis, suspect chronic changes of the left common femoral and      superficial femoral veins from previous DVT.  (2) Left lower extremity      varicosities without active thrombophlebitis.  (3) Multiple lymph nodes      in the thigh and popliteal region related to ongoing left lower      extremity cellulitis.  2.  CT scan of the abdomen revealed left adrenal adenoma, now 3.0 x 2.9 cm      size, slightly larger than the previous study done at [facility], where it      was measured at 2.7 x  2.4 cm.  It remains low-attenuation and most      consistent with an adenoma.  Small hiatal hernia.  Nonspecific haziness      of the small bowel mesentery.   HOSPITAL COURSE:  Please refer to admission history and physical.  This is a  60 year old Caucasian lady with a history of chronic thrombophlebitis, a  remote history of DVT related to a factor V Leiden heterozygosity, who  presented with a history of worsening pain and swelling in the left leg and  groin for three days prior to admission and was found to have severe  cellulitis of the left lower extremity with associated lymphadenitis.  The  patient was admitted, started on intravenous antibiotics but got worse over  the succeeding days before she started to get better.  Blood cultures have  been  consistently negative, but the patient has responded slowly to  intravenous antibiotics.  Because of her slow response, it is considered  best to send her off on a combination of vancomycin and Levaquin since her  blood cultures have been persistently negative.   The patient's admission INR was 4 and without Coumadin, it rose to 5.3 and  then later 6.3.  The patient required a PICC line for discharge, and she  received one unit of FFP at my discretion for placement of a PICC line.  Her  INR this morning is 3.0 but is expected to rise.  She has been advised not  to restart Coumadin until her INR is checked on Monday.   The patient has a history of severe B12 deficiency anemia and she has been  recommended to take daily oral high-dose B12 to avoid frequent injections  and a state of anticoagulation because of chronic Coumadin therapy.   The patient has a history of adrenal tumor and a repeat CT was done to  assess the progress, and there has been no significant enlargement.   LABS:  wbc: 7.7, Hb: 11.4, Plts: 382,  INR: 3.0, Na 138, K 3.9, Bun 3, Creat 0.6,  Vanc trough 11.1   FOLLOW-UP:  The patient is to follow up with her primary  care physician, Dr.  Molly Maduro Day, to monitor her cellulitis and determine when she can be switched  to oral medication.  We recommend at least 10 days of intravenous therapy.  She is to follow up with Dr. Mariel Sleet for monitoring of her INR and B12  deficiency.  We have asked the home health nurse to check her INR Monday and  forward the results to Dr. Mariel Sleet.   SPECIAL INSTRUCTIONS:  In view of ongoing severe pain in the left lower  extremity, she has been prescribed Percocet but has been advised to take  fall precautions since she is anticoagulated.      Vania Rea, M.D.  Electronically Signed     LC/MEDQ  D:  11/01/2004  T:  11/01/2004  Job:  [id number]   cc:   [name], M.D.   Ladona Horns. Mariel Sleet, MD  Fax: [phone]

## 2010-06-17 ENCOUNTER — Other Ambulatory Visit (HOSPITAL_COMMUNITY): Payer: Self-pay | Admitting: Oncology

## 2010-06-17 ENCOUNTER — Encounter (HOSPITAL_COMMUNITY): Payer: Medicare Other | Attending: Oncology

## 2010-06-17 DIAGNOSIS — Z7901 Long term (current) use of anticoagulants: Secondary | ICD-10-CM | POA: Insufficient documentation

## 2010-06-17 DIAGNOSIS — Z86718 Personal history of other venous thrombosis and embolism: Secondary | ICD-10-CM | POA: Insufficient documentation

## 2010-06-17 DIAGNOSIS — D6859 Other primary thrombophilia: Secondary | ICD-10-CM | POA: Insufficient documentation

## 2010-06-17 DIAGNOSIS — I82409 Acute embolism and thrombosis of unspecified deep veins of unspecified lower extremity: Secondary | ICD-10-CM

## 2010-06-17 LAB — PROTIME-INR
INR: 2.44 — ABNORMAL HIGH (ref 0.00–1.49)
Prothrombin Time: 26.6 seconds — ABNORMAL HIGH (ref 11.6–15.2)

## 2010-07-08 ENCOUNTER — Encounter (HOSPITAL_COMMUNITY): Payer: Self-pay | Admitting: *Deleted

## 2010-07-10 ENCOUNTER — Encounter (HOSPITAL_COMMUNITY): Payer: Self-pay | Admitting: Oncology

## 2010-07-10 ENCOUNTER — Other Ambulatory Visit (HOSPITAL_COMMUNITY): Payer: Self-pay | Admitting: Oncology

## 2010-07-10 DIAGNOSIS — I82409 Acute embolism and thrombosis of unspecified deep veins of unspecified lower extremity: Secondary | ICD-10-CM

## 2010-07-10 DIAGNOSIS — D510 Vitamin B12 deficiency anemia due to intrinsic factor deficiency: Secondary | ICD-10-CM

## 2010-07-10 HISTORY — DX: Vitamin B12 deficiency anemia due to intrinsic factor deficiency: D51.0

## 2010-07-10 HISTORY — DX: Acute embolism and thrombosis of unspecified deep veins of unspecified lower extremity: I82.409

## 2010-07-23 ENCOUNTER — Other Ambulatory Visit (HOSPITAL_COMMUNITY): Payer: Self-pay | Admitting: Oncology

## 2010-07-23 ENCOUNTER — Encounter (HOSPITAL_COMMUNITY): Payer: Medicare Other | Attending: Oncology

## 2010-07-23 DIAGNOSIS — Z7901 Long term (current) use of anticoagulants: Secondary | ICD-10-CM | POA: Insufficient documentation

## 2010-07-23 DIAGNOSIS — Z86718 Personal history of other venous thrombosis and embolism: Secondary | ICD-10-CM | POA: Insufficient documentation

## 2010-07-23 DIAGNOSIS — I82409 Acute embolism and thrombosis of unspecified deep veins of unspecified lower extremity: Secondary | ICD-10-CM

## 2010-07-23 DIAGNOSIS — D6859 Other primary thrombophilia: Secondary | ICD-10-CM | POA: Insufficient documentation

## 2010-07-28 ENCOUNTER — Other Ambulatory Visit (HOSPITAL_COMMUNITY): Payer: Medicare Other

## 2010-07-31 ENCOUNTER — Encounter (HOSPITAL_COMMUNITY): Payer: Medicare Other | Attending: Oncology

## 2010-07-31 ENCOUNTER — Other Ambulatory Visit (HOSPITAL_COMMUNITY): Payer: Self-pay | Admitting: Oncology

## 2010-07-31 DIAGNOSIS — D6859 Other primary thrombophilia: Secondary | ICD-10-CM | POA: Insufficient documentation

## 2010-07-31 DIAGNOSIS — Z86718 Personal history of other venous thrombosis and embolism: Secondary | ICD-10-CM | POA: Insufficient documentation

## 2010-07-31 DIAGNOSIS — I82409 Acute embolism and thrombosis of unspecified deep veins of unspecified lower extremity: Secondary | ICD-10-CM

## 2010-07-31 DIAGNOSIS — Z7901 Long term (current) use of anticoagulants: Secondary | ICD-10-CM | POA: Insufficient documentation

## 2010-07-31 LAB — CBC
Hemoglobin: 11.9 g/dL — ABNORMAL LOW (ref 12.0–15.0)
MCH: 27.3 pg (ref 26.0–34.0)
MCHC: 32.2 g/dL (ref 30.0–36.0)
MCV: 84.9 fL (ref 78.0–100.0)

## 2010-07-31 LAB — DIFFERENTIAL
Basophils Relative: 0 % (ref 0–1)
Basophils Relative: 0 % (ref 0–1)
Eosinophils Absolute: 0.1 10*3/uL (ref 0.0–0.7)
Eosinophils Absolute: 0.1 10*3/uL (ref 0.0–0.7)
Eosinophils Relative: 3 % (ref 0–5)
Lymphs Abs: 1.8 10*3/uL (ref 0.7–4.0)
Monocytes Absolute: 0.5 10*3/uL (ref 0.1–1.0)
Monocytes Relative: 12 % (ref 3–12)
Monocytes Relative: 12 % (ref 3–12)
Neutro Abs: 1.7 10*3/uL (ref 1.7–7.7)
Neutrophils Relative %: 41 % — ABNORMAL LOW (ref 43–77)

## 2010-07-31 LAB — IRON AND TIBC
Saturation Ratios: 16 % — ABNORMAL LOW (ref 20–55)
TIBC: 354 ug/dL (ref 250–470)

## 2010-07-31 LAB — FERRITIN: Ferritin: 13 ng/mL (ref 10–291)

## 2010-07-31 LAB — PROTIME-INR
INR: 2.71 — ABNORMAL HIGH (ref 0.00–1.49)
Prothrombin Time: 29.2 seconds — ABNORMAL HIGH (ref 11.6–15.2)

## 2010-08-05 ENCOUNTER — Encounter (HOSPITAL_BASED_OUTPATIENT_CLINIC_OR_DEPARTMENT_OTHER): Payer: Medicare Other | Admitting: Oncology

## 2010-08-05 DIAGNOSIS — I82409 Acute embolism and thrombosis of unspecified deep veins of unspecified lower extremity: Secondary | ICD-10-CM

## 2010-08-05 DIAGNOSIS — D6859 Other primary thrombophilia: Secondary | ICD-10-CM

## 2010-08-05 DIAGNOSIS — Z7901 Long term (current) use of anticoagulants: Secondary | ICD-10-CM

## 2010-08-05 DIAGNOSIS — D509 Iron deficiency anemia, unspecified: Secondary | ICD-10-CM

## 2010-08-05 DIAGNOSIS — D682 Hereditary deficiency of other clotting factors: Secondary | ICD-10-CM

## 2010-08-05 MED ORDER — POLYSACCHARIDE IRON COMPLEX 150 MG PO CAPS: 150.0000 mg | ORAL_CAPSULE | Freq: Every day | ORAL | Status: DC

## 2010-08-05 NOTE — Patient Instructions (Signed)
[facility]  Discharge Instructions  RECOMMENDATIONS MADE BY THE CONSULTANT AND ANY TEST RESULTS WILL BE SENT TO YOUR REFERRING DOCTOR.   EXAM FINDINGS BY MD TODAY AND SIGNS AND SYMPTOMS TO REPORT TO CLINIC OR PRIMARY MD: NEED TO KEEP LEG ELEVATED AS MUCH POSSIBLE  MEDICATIONS PRESCRIBED: IRON PILL PRESCRIPTION FAXED TO PHARMACY - TAKE 1 DAILY     SPECIAL INSTRUCTIONS/FOLLOW-UP: WILL MAKE REFERRAL TO VASCULAR SURGEON FOR PROBLEMS WITH LEFT LEG.  NEXT PT LEVEL 7/26 & MD FOLLOW-UP 12/18 AT 910.    I acknowledge that I have been informed and understand all the instructions given to me and received a copy. I do not have any more questions at this time, but understand that I may call the Specialty Clinic at [facility] at [phone] during business hours should I have any further questions or need assistance in obtaining follow-up care.    __________________________________________  _____________  __________ Signature of Patient or Authorized Representative            Date                   Time    __________________________________________ Nurse's Signature

## 2010-08-05 NOTE — Progress Notes (Signed)
This office note has been dictated.

## 2010-08-08 ENCOUNTER — Telehealth (HOSPITAL_COMMUNITY): Payer: Self-pay

## 2010-08-08 NOTE — Telephone Encounter (Signed)
Referral made to Dr. Gretta Began.  Records faxed and their office will contact patient with appointment date, time and instructions.  Will also call us with date and time of appointment.

## 2010-08-12 ENCOUNTER — Encounter: Payer: Medicare Other | Admitting: Vascular Surgery

## 2010-08-18 ENCOUNTER — Encounter: Payer: Self-pay | Admitting: Vascular Surgery

## 2010-08-21 ENCOUNTER — Other Ambulatory Visit (HOSPITAL_COMMUNITY): Payer: Self-pay | Admitting: Oncology

## 2010-08-21 ENCOUNTER — Telehealth (HOSPITAL_COMMUNITY): Payer: Self-pay | Admitting: *Deleted

## 2010-08-21 ENCOUNTER — Encounter (HOSPITAL_BASED_OUTPATIENT_CLINIC_OR_DEPARTMENT_OTHER): Payer: Medicare Other

## 2010-08-21 DIAGNOSIS — Z7901 Long term (current) use of anticoagulants: Secondary | ICD-10-CM

## 2010-08-21 DIAGNOSIS — I82409 Acute embolism and thrombosis of unspecified deep veins of unspecified lower extremity: Secondary | ICD-10-CM

## 2010-08-21 LAB — PROTIME-INR
INR: 2.94 — ABNORMAL HIGH (ref 0.00–1.49)
Prothrombin Time: 31.1 seconds — ABNORMAL HIGH (ref 11.6–15.2)

## 2010-08-21 NOTE — Progress Notes (Signed)
Instructed pt to continue same dose coumadin. She will get with Amy Fedie regarding next inr appt.

## 2010-08-21 NOTE — Progress Notes (Signed)
Labs drawn today for pt.  Coud dose 7.5 mg . Call pt at [phone]

## 2010-08-24 ENCOUNTER — Emergency Department (HOSPITAL_COMMUNITY): Payer: Medicare Other

## 2010-08-24 ENCOUNTER — Emergency Department (HOSPITAL_COMMUNITY)
Admission: EM | Admit: 2010-08-24 | Discharge: 2010-08-24 | Disposition: A | Discharge status: Home / Self Care | Payer: Medicare Other | Attending: Emergency Medicine | Admitting: Emergency Medicine

## 2010-08-24 DIAGNOSIS — D6859 Other primary thrombophilia: Secondary | ICD-10-CM | POA: Insufficient documentation

## 2010-08-24 DIAGNOSIS — R110 Nausea: Secondary | ICD-10-CM | POA: Insufficient documentation

## 2010-08-24 DIAGNOSIS — R0789 Other chest pain: Secondary | ICD-10-CM | POA: Insufficient documentation

## 2010-08-24 DIAGNOSIS — M79609 Pain in unspecified limb: Secondary | ICD-10-CM | POA: Insufficient documentation

## 2010-08-24 DIAGNOSIS — R42 Dizziness and giddiness: Secondary | ICD-10-CM | POA: Insufficient documentation

## 2010-08-24 DIAGNOSIS — R51 Headache: Secondary | ICD-10-CM | POA: Insufficient documentation

## 2010-08-24 LAB — DIFFERENTIAL
Basophils Absolute: 0 10*3/uL (ref 0.0–0.1)
Basophils Relative: 0 % (ref 0–1)
Eosinophils Absolute: 0.2 10*3/uL (ref 0.0–0.7)
Monocytes Relative: 11 % (ref 3–12)
Neutrophils Relative %: 49 % (ref 43–77)

## 2010-08-24 LAB — CBC
MCH: 26.1 pg (ref 26.0–34.0)
Platelets: 285 10*3/uL (ref 150–400)
RBC: 4.9 MIL/uL (ref 3.87–5.11)
WBC: 5 10*3/uL (ref 4.0–10.5)

## 2010-08-24 LAB — COMPREHENSIVE METABOLIC PANEL
ALT: 31 U/L (ref 0–35)
Alkaline Phosphatase: 98 U/L (ref 39–117)
GFR calc Af Amer: >60 mL/min (ref >60)
Glucose, Bld: 108 mg/dL — ABNORMAL HIGH (ref 70–99)
Potassium: 4.4 mEq/L (ref 3.5–5.1)
Sodium: 141 mEq/L (ref 135–145)
Total Protein: 7.3 g/dL (ref 6.0–8.3)

## 2010-08-24 LAB — CK TOTAL AND CKMB (NOT AT ARMC): CK, MB: 3.7 ng/mL (ref 0.3–4.0)

## 2010-08-28 NOTE — Telephone Encounter (Signed)
Pt instructed to continue same coumadin dose and to return in 10 days for pt/inr. she said she would get with Amy Mule in regards to getting her appt scheduled.    By Lennice Sites

## 2010-09-01 ENCOUNTER — Other Ambulatory Visit (HOSPITAL_COMMUNITY): Payer: Self-pay | Admitting: Oncology

## 2010-09-01 ENCOUNTER — Encounter (HOSPITAL_COMMUNITY): Payer: Medicare Other | Attending: Oncology

## 2010-09-01 DIAGNOSIS — D682 Hereditary deficiency of other clotting factors: Secondary | ICD-10-CM | POA: Insufficient documentation

## 2010-09-01 DIAGNOSIS — I82409 Acute embolism and thrombosis of unspecified deep veins of unspecified lower extremity: Secondary | ICD-10-CM

## 2010-09-01 NOTE — Progress Notes (Signed)
Patient called to inform of Coumadin dose and when to return to the clinic for PT/INR.  Patient verbalized understanding.

## 2010-09-01 NOTE — Progress Notes (Signed)
Labs drawn today for pt .  7.5 mg coud qd.  Call patient with results at [phone]

## 2010-09-02 ENCOUNTER — Encounter: Payer: Medicare Other | Admitting: Vascular Surgery

## 2010-09-02 ENCOUNTER — Other Ambulatory Visit (HOSPITAL_COMMUNITY): Payer: Medicare Other

## 2010-09-15 ENCOUNTER — Other Ambulatory Visit (HOSPITAL_COMMUNITY): Payer: Medicare Other

## 2010-09-23 ENCOUNTER — Encounter (HOSPITAL_COMMUNITY): Payer: Medicare Other

## 2010-09-23 ENCOUNTER — Other Ambulatory Visit (HOSPITAL_COMMUNITY): Payer: Self-pay | Admitting: Oncology

## 2010-09-23 ENCOUNTER — Telehealth (HOSPITAL_COMMUNITY): Payer: Self-pay | Admitting: Oncology

## 2010-09-23 DIAGNOSIS — D682 Hereditary deficiency of other clotting factors: Secondary | ICD-10-CM

## 2010-09-23 LAB — PROTIME-INR: INR: 3.33 — ABNORMAL HIGH (ref 0.00–1.49)

## 2010-09-23 NOTE — Progress Notes (Signed)
Labs drawn today for pt.  Patient on 7.5mg  coud a day.  Call patient at [phone]

## 2010-09-23 NOTE — Telephone Encounter (Signed)
Telephone encounter:  Patient asked to stay on same dose of Coumadin.  PT/INR on Friday

## 2010-09-23 NOTE — Progress Notes (Signed)
Addended by: Ellouise Newer III on: 09/23/2010 12:28 PM   Modules accepted: Orders

## 2010-09-26 ENCOUNTER — Encounter (HOSPITAL_BASED_OUTPATIENT_CLINIC_OR_DEPARTMENT_OTHER): Payer: Medicare Other

## 2010-09-26 ENCOUNTER — Other Ambulatory Visit (HOSPITAL_COMMUNITY): Payer: Self-pay | Admitting: Oncology

## 2010-09-26 ENCOUNTER — Telehealth (HOSPITAL_COMMUNITY): Payer: Self-pay | Admitting: *Deleted

## 2010-09-26 DIAGNOSIS — D682 Hereditary deficiency of other clotting factors: Secondary | ICD-10-CM

## 2010-09-26 DIAGNOSIS — K922 Gastrointestinal hemorrhage, unspecified: Secondary | ICD-10-CM

## 2010-09-26 LAB — PROTIME-INR: Prothrombin Time: 39.5 seconds — ABNORMAL HIGH (ref 11.6–15.2)

## 2010-09-26 NOTE — Progress Notes (Signed)
Labs drawn today for pt. Patient on 7.5mg  .  Call patient at [phone]

## 2010-09-26 NOTE — Telephone Encounter (Signed)
Message left on answering machine as below. 

## 2010-09-26 NOTE — Telephone Encounter (Signed)
Message copied by Dennie Maizes on Fri Sep 26, 2010  4:45 PM ------      Message from: Ellouise Newer III      Created: Fri Sep 26, 2010 11:33 AM       Hold coumadin x 2 days            Then re-start at 7.5 mg daily.            PT/INR on Wednesday

## 2010-10-01 ENCOUNTER — Other Ambulatory Visit (HOSPITAL_COMMUNITY): Payer: Self-pay | Admitting: Oncology

## 2010-10-01 ENCOUNTER — Encounter (HOSPITAL_COMMUNITY): Payer: Medicare Other | Attending: Oncology

## 2010-10-01 DIAGNOSIS — K921 Melena: Secondary | ICD-10-CM

## 2010-10-01 DIAGNOSIS — R195 Other fecal abnormalities: Secondary | ICD-10-CM | POA: Insufficient documentation

## 2010-10-01 DIAGNOSIS — D649 Anemia, unspecified: Secondary | ICD-10-CM | POA: Insufficient documentation

## 2010-10-01 DIAGNOSIS — D682 Hereditary deficiency of other clotting factors: Secondary | ICD-10-CM | POA: Insufficient documentation

## 2010-10-01 DIAGNOSIS — K922 Gastrointestinal hemorrhage, unspecified: Secondary | ICD-10-CM

## 2010-10-01 LAB — DIFFERENTIAL
Lymphocytes Relative: 39 % (ref 12–46)
Lymphs Abs: 2.1 10*3/uL (ref 0.7–4.0)
Neutro Abs: 2.6 10*3/uL (ref 1.7–7.7)
Neutrophils Relative %: 48 % (ref 43–77)

## 2010-10-01 LAB — CBC
MCV: 87.5 fL (ref 78.0–100.0)
Platelets: 376 10*3/uL (ref 150–400)
RBC: 3.19 MIL/uL — ABNORMAL LOW (ref 3.87–5.11)
WBC: 5.5 10*3/uL (ref 4.0–10.5)

## 2010-10-01 NOTE — Progress Notes (Signed)
[name] presented for labwork. Labs per MD order drawn via Peripheral Line 25 gauge needle inserted in lt arm  Good blood return present. Procedure without incident.  Needle removed intact. Patient tolerated procedure well.  Held coumadin x 2 days--Sat and Sun---started back on coumadin 7.5mg  daily on Monday night.

## 2010-10-02 ENCOUNTER — Other Ambulatory Visit (HOSPITAL_COMMUNITY): Payer: Self-pay | Admitting: Oncology

## 2010-10-02 LAB — IRON AND TIBC
Iron: 17 ug/dL — ABNORMAL LOW (ref 42–135)
TIBC: 348 ug/dL (ref 250–470)
UIBC: 331 ug/dL (ref 125–400)

## 2010-10-02 LAB — FERRITIN: Ferritin: 12 ng/mL (ref 10–291)

## 2010-10-06 ENCOUNTER — Encounter (HOSPITAL_BASED_OUTPATIENT_CLINIC_OR_DEPARTMENT_OTHER): Payer: Medicare Other

## 2010-10-06 DIAGNOSIS — K921 Melena: Secondary | ICD-10-CM

## 2010-10-06 DIAGNOSIS — D649 Anemia, unspecified: Secondary | ICD-10-CM

## 2010-10-06 MED ORDER — FERUMOXYTOL INJECTION 510 MG/17 ML
1020.0000 mg | Freq: Once | INTRAVENOUS | Status: AC
  Administered 2010-10-06: 1020 mg via INTRAVENOUS
  Filled 2010-10-06: qty 34

## 2010-10-06 MED ORDER — SODIUM CHLORIDE 0.9 % IV SOLN
INTRAVENOUS | Status: DC
  Administered 2010-10-06: 10:00:00 via INTRAVENOUS

## 2010-10-08 ENCOUNTER — Encounter (HOSPITAL_BASED_OUTPATIENT_CLINIC_OR_DEPARTMENT_OTHER): Payer: Medicare Other

## 2010-10-08 ENCOUNTER — Other Ambulatory Visit (HOSPITAL_COMMUNITY): Payer: Self-pay | Admitting: Oncology

## 2010-10-08 DIAGNOSIS — D682 Hereditary deficiency of other clotting factors: Secondary | ICD-10-CM

## 2010-10-08 LAB — PROTIME-INR: INR: 2.47 — ABNORMAL HIGH (ref 0.00–1.49)

## 2010-10-08 NOTE — Progress Notes (Addendum)
Notified to continue same dosage of coumadin and to RTC 9/19 for repeat PT/INR.  Is willing to see Dr. Karilyn Cota if needed.

## 2010-10-08 NOTE — Progress Notes (Signed)
Patient had pt drawn today.  Patient on 7.5mg  of coud.  Call patient at [phone]

## 2010-10-08 NOTE — Progress Notes (Signed)
Call from Amy that her Mom looks really bad today. Pale, nailbeds white, wonders if you wanted to bring her back to recheck hgb or do you need to see her. In answer to your question about GI doc, it has been along time. They wanted to do colonoscopy but she had allergy to lovenox and so she was not able to come off coumadin and have it done.

## 2010-10-09 ENCOUNTER — Encounter (HOSPITAL_COMMUNITY): Payer: Self-pay | Admitting: Oncology

## 2010-10-09 ENCOUNTER — Encounter (HOSPITAL_BASED_OUTPATIENT_CLINIC_OR_DEPARTMENT_OTHER): Payer: Medicare Other | Admitting: Oncology

## 2010-10-09 ENCOUNTER — Other Ambulatory Visit (HOSPITAL_COMMUNITY): Payer: Self-pay | Admitting: Oncology

## 2010-10-09 ENCOUNTER — Encounter (HOSPITAL_BASED_OUTPATIENT_CLINIC_OR_DEPARTMENT_OTHER): Payer: Medicare Other

## 2010-10-09 DIAGNOSIS — D6859 Other primary thrombophilia: Secondary | ICD-10-CM

## 2010-10-09 DIAGNOSIS — D510 Vitamin B12 deficiency anemia due to intrinsic factor deficiency: Secondary | ICD-10-CM

## 2010-10-09 DIAGNOSIS — D509 Iron deficiency anemia, unspecified: Secondary | ICD-10-CM

## 2010-10-09 DIAGNOSIS — D6851 Activated protein C resistance: Secondary | ICD-10-CM | POA: Insufficient documentation

## 2010-10-09 DIAGNOSIS — I82409 Acute embolism and thrombosis of unspecified deep veins of unspecified lower extremity: Secondary | ICD-10-CM

## 2010-10-09 DIAGNOSIS — D649 Anemia, unspecified: Secondary | ICD-10-CM

## 2010-10-09 LAB — COMPREHENSIVE METABOLIC PANEL
ALT: 21 U/L (ref 0–35)
Albumin: 3.6 g/dL (ref 3.5–5.2)
Alkaline Phosphatase: 97 U/L (ref 39–117)
BUN: 9 mg/dL (ref 6–23)
Chloride: 108 mEq/L (ref 96–112)
Glucose, Bld: 88 mg/dL (ref 70–99)
Potassium: 4.4 mEq/L (ref 3.5–5.1)
Sodium: 141 mEq/L (ref 135–145)
Total Bilirubin: 0.2 mg/dL — ABNORMAL LOW (ref 0.3–1.2)

## 2010-10-09 LAB — DIFFERENTIAL
Basophils Relative: 0 % (ref 0–1)
Eosinophils Absolute: 0.2 10*3/uL (ref 0.0–0.7)
Eosinophils Relative: 3 % (ref 0–5)
Monocytes Relative: 10 % (ref 3–12)
Neutrophils Relative %: 50 % (ref 43–77)

## 2010-10-09 LAB — CBC
HCT: 31.7 % — ABNORMAL LOW (ref 36.0–46.0)
Hemoglobin: 9.8 g/dL — ABNORMAL LOW (ref 12.0–15.0)
RDW: 16.2 % — ABNORMAL HIGH (ref 11.5–15.5)
WBC: 6.2 10*3/uL (ref 4.0–10.5)

## 2010-10-09 NOTE — Patient Instructions (Signed)
[facility]  Discharge Instructions  RECOMMENDATIONS MADE BY THE CONSULTANT AND ANY TEST RESULTS WILL BE SENT TO YOUR REFERRING DOCTOR.   EXAM FINDINGS BY MD TODAY AND SIGNS AND SYMPTOMS TO REPORT TO CLINIC OR PRIMARY MD: Hemoglobin is 9.8 so you don't need any blood right now.  We will make a referral to Dr. Karilyn Cota  MEDICATIONS PRESCRIBED:  None   SPECIAL INSTRUCTIONS/FOLLOW-UP: Lab work Needed on Wednesday 9/19 at 9:00am  and Other (Referral/Appointments) will make referral to Dr. Karilyn Cota.    I acknowledge that I have been informed and understand all the instructions given to me and received a copy. I do not have any more questions at this time, but understand that I may call the Specialty Clinic at [facility] at [phone] during business hours should I have any further questions or need assistance in obtaining follow-up care.    __________________________________________  _____________  __________ Signature of Patient or Authorized Representative            Date                   Time    __________________________________________ Nurse's Signature

## 2010-10-09 NOTE — Progress Notes (Signed)
Labs drawn today for cbc,diff,cmp 

## 2010-10-09 NOTE — Progress Notes (Signed)
Monica Becton, MD [address] [place] [address]  1. Factor V Leiden    2. Anemia  CBC, Ambulatory referral to Gastroenterology  3. Pernicious anemia    4. DVT (deep venous thrombosis)    5. ANEMIA, IRON DEFICIENCY, CHRONIC      CURRENT THERAPY:IV Iron infusion PRN  INTERVAL HISTORY: [name] 60 y.o. female returns for  regular  visit for followup of Iron deficiency anemia.  The patient reports that she has been extremely fatigued lately.  She recently had an IV Feraheme infusion on 10/06/10.   The patient reports that's she continues to has decreased energy.  She denies any bleeding.  She denies any epistaxis, gingival bleeding, hematuria, blood in stool, black tarry stool, and vaginal bleeding.  She does report black tarry stool in the past, but that has resolved.  She is S/P 3 hemoccult stool cards which were negative.  She reports that these were negative despite having black tarry stools during that time frame.    The patient denies any pain or discomfort.  She is simply fatigued.  The patient reports having esophageal ulcers in the past diagnosed by Dr. Jena Gauss.    Past Medical History  Diagnosis Date  . Vitamin B12 deficiency     vit b12 1000 mcg monthly  . Cellulitis of left leg 2006  . Ulcer 05/2009    esophageal  . Clotting disorder     heterozygosity from factor v leiden  . Pernicious anemia 07/10/2010  . DVT (deep venous thrombosis) 07/10/2010  . Factor V Leiden     has ANEMIA, IRON DEFICIENCY, CHRONIC; FACTOR V DEFICIENCY; REFLUX ESOPHAGITIS; GI BLEEDING; WEIGHT GAIN; CONTUSION, ARM; DVT (deep venous thrombosis); Pernicious anemia; and Factor V Leiden on her problem list.     is allergic to cephalexin; dexlansoprazole; latex; pantoprazole sodium; penicillins; lovenox; nylon; and sulfonamide derivatives.  Ms. Rosetti had no medications administered during this visit.  Past Surgical History  Procedure Date  . Abdominal  hysterectomy 1989    Denies any headaches, dizziness, double vision, fevers, chills, night sweats, nausea, vomiting, diarrhea, constipation, chest pain, heart palpitations, shortness of breath, blood in stool, black tarry stool, urinary pain, urinary burning, urinary frequency, hematuria.   PHYSICAL EXAMINATION  ECOG PERFORMANCE STATUS: 1 - Symptomatic but completely ambulatory  Filed Vitals:   10/09/10 0941  BP: 132/72  Pulse: 71  Temp: 97.8 F (36.6 C)    GENERAL:alert, no distress, well nourished, well developed, comfortable, cooperative and pale SKIN: pale HEAD: Normocephalic EYES: pale conjunctivae EARS: External ears normal OROPHARYNX:pale mucous membranes  NECK: trachea midline LYMPH:  not examined BREAST:not examined LUNGS: clear to auscultation and percussion HEART: regular rate & rhythm, no murmurs, no gallops, S1 normal and S2 normal ABDOMEN:abdomen soft, non-tender, obese and normal bowel sounds BACK: not examined EXTREMITIES:less then 2 second capillary refill, no joint deformities, effusion, or inflammation, no edema, no skin discoloration, no clubbing, no cyanosis, left LE reveals a wrapped ulcer near the ankle  NEURO: alert & oriented x 3 with fluent speech, no focal motor/sensory deficits, gait normal    LABORATORY DATA: CBC    Component Value Date/Time   WBC 6.2 10/09/2010 0939   RBC 3.64* 10/09/2010 0939   HGB 9.8* 10/09/2010 0939   HCT 31.7* 10/09/2010 0939   PLT 403* 10/09/2010 0939   MCV 87.1 10/09/2010 0939   MCH 26.9 10/09/2010 0939   MCHC 30.9 10/09/2010 0939   RDW 16.2* 10/09/2010 1308  LYMPHSABS 2.2 10/09/2010 0939   MONOABS 0.6 10/09/2010 0939   EOSABS 0.2 10/09/2010 0939   BASOSABS 0.0 10/09/2010 0939    Lab Results  Component Value Date   OCCULTBLD NEGATIVE 10/06/2010   OCCULTBLD NEGATIVE 10/06/2010   OCCULTBLD NEGATIVE 10/06/2010      ASSESSMENT:  1. Anemia 2. Iron deficiency anemia 3. H/O Black tarry stools 4. Fatigue   PLAN:  1.  Referral to Dr. Karilyn Cota, per patient request 2. CBC with next PT/INR on 10/15/10 3. Return as scheduled. 4. I personally reviewed and went over laboratory results with the patient.   All questions were answered. The patient knows to call the clinic with any problems, questions or concerns. We can certainly see the patient much sooner if necessary.   KEFALAS,THOMAS

## 2010-10-15 ENCOUNTER — Other Ambulatory Visit (HOSPITAL_COMMUNITY): Payer: Self-pay | Admitting: Oncology

## 2010-10-15 ENCOUNTER — Telehealth (HOSPITAL_COMMUNITY): Payer: Self-pay | Admitting: *Deleted

## 2010-10-15 ENCOUNTER — Encounter (HOSPITAL_COMMUNITY): Payer: Medicare Other

## 2010-10-15 DIAGNOSIS — D682 Hereditary deficiency of other clotting factors: Secondary | ICD-10-CM

## 2010-10-15 DIAGNOSIS — D649 Anemia, unspecified: Secondary | ICD-10-CM

## 2010-10-15 LAB — CBC
HCT: 33 % — ABNORMAL LOW (ref 36.0–46.0)
MCV: 89.4 fL (ref 78.0–100.0)
RBC: 3.69 MIL/uL — ABNORMAL LOW (ref 3.87–5.11)
RDW: 19.3 % — ABNORMAL HIGH (ref 11.5–15.5)
WBC: 5.2 10*3/uL (ref 4.0–10.5)

## 2010-10-15 NOTE — Progress Notes (Signed)
Patient had cbc and pt drawn today.  Patient on 7.5mg  qd.  Call patient at [phone]

## 2010-10-15 NOTE — Telephone Encounter (Signed)
Spoke with pt. Instruct as below. Pt verbalizes understanding.

## 2010-10-15 NOTE — Telephone Encounter (Signed)
Message copied by Dennie Maizes on Wed Oct 15, 2010 11:37 AM ------      Message from: Ellouise Newer III      Created: Wed Oct 15, 2010 10:51 AM       Hold one day.  Restart same dose.            PT/INR on monday

## 2010-10-17 ENCOUNTER — Encounter (INDEPENDENT_AMBULATORY_CARE_PROVIDER_SITE_OTHER): Payer: Self-pay | Admitting: *Deleted

## 2010-10-20 ENCOUNTER — Other Ambulatory Visit (HOSPITAL_COMMUNITY): Payer: Self-pay | Admitting: Oncology

## 2010-10-20 ENCOUNTER — Encounter (HOSPITAL_BASED_OUTPATIENT_CLINIC_OR_DEPARTMENT_OTHER): Payer: Medicare Other

## 2010-10-20 ENCOUNTER — Telehealth (HOSPITAL_COMMUNITY): Payer: Self-pay | Admitting: *Deleted

## 2010-10-20 DIAGNOSIS — D682 Hereditary deficiency of other clotting factors: Secondary | ICD-10-CM

## 2010-10-20 LAB — PROTIME-INR
INR: 2.87 — ABNORMAL HIGH (ref 0.00–1.49)
Prothrombin Time: 30.5 seconds — ABNORMAL HIGH (ref 11.6–15.2)

## 2010-10-20 NOTE — Telephone Encounter (Signed)
Message copied by Dennie Maizes on Mon Oct 20, 2010 11:52 AM ------      Message from: Ellouise Newer III      Created: Mon Oct 20, 2010  9:56 AM       Same dose.            PT/INR on Friday

## 2010-10-20 NOTE — Progress Notes (Signed)
Labs drawn today for pt.  Patient on 7.5mg qd.  Call patient at [phone] 

## 2010-10-20 NOTE — Telephone Encounter (Signed)
Spoke with pt's husband, instructions as below. Verbalizes understanding and will relay information to pt.

## 2010-10-22 ENCOUNTER — Other Ambulatory Visit (HOSPITAL_COMMUNITY): Payer: Medicare Other

## 2010-10-24 ENCOUNTER — Other Ambulatory Visit (HOSPITAL_COMMUNITY): Payer: Medicare Other

## 2010-10-28 ENCOUNTER — Other Ambulatory Visit (HOSPITAL_COMMUNITY): Payer: Medicare Other

## 2010-10-29 ENCOUNTER — Telehealth (INDEPENDENT_AMBULATORY_CARE_PROVIDER_SITE_OTHER): Payer: Self-pay | Admitting: *Deleted

## 2010-10-29 ENCOUNTER — Encounter (HOSPITAL_COMMUNITY): Payer: Medicare Other | Attending: Oncology

## 2010-10-29 ENCOUNTER — Ambulatory Visit (INDEPENDENT_AMBULATORY_CARE_PROVIDER_SITE_OTHER): Payer: Medicare Other | Admitting: Internal Medicine

## 2010-10-29 ENCOUNTER — Encounter (INDEPENDENT_AMBULATORY_CARE_PROVIDER_SITE_OTHER): Payer: Self-pay | Admitting: *Deleted

## 2010-10-29 ENCOUNTER — Other Ambulatory Visit (HOSPITAL_COMMUNITY): Payer: Self-pay | Admitting: Oncology

## 2010-10-29 ENCOUNTER — Other Ambulatory Visit (INDEPENDENT_AMBULATORY_CARE_PROVIDER_SITE_OTHER): Payer: Self-pay | Admitting: *Deleted

## 2010-10-29 ENCOUNTER — Telehealth (HOSPITAL_COMMUNITY): Payer: Self-pay | Admitting: *Deleted

## 2010-10-29 ENCOUNTER — Encounter (INDEPENDENT_AMBULATORY_CARE_PROVIDER_SITE_OTHER): Payer: Self-pay | Admitting: Internal Medicine

## 2010-10-29 VITALS — BP 142/68 | HR 72 | Temp 98.0°F | Ht 67.5 in | Wt 231.5 lb

## 2010-10-29 DIAGNOSIS — D682 Hereditary deficiency of other clotting factors: Secondary | ICD-10-CM | POA: Insufficient documentation

## 2010-10-29 DIAGNOSIS — D649 Anemia, unspecified: Secondary | ICD-10-CM

## 2010-10-29 DIAGNOSIS — K222 Esophageal obstruction: Secondary | ICD-10-CM

## 2010-10-29 DIAGNOSIS — K219 Gastro-esophageal reflux disease without esophagitis: Secondary | ICD-10-CM

## 2010-10-29 LAB — PROTIME-INR
INR: 3.63 — ABNORMAL HIGH (ref 0.00–1.49)
Prothrombin Time: 36.7 seconds — ABNORMAL HIGH (ref 11.6–15.2)

## 2010-10-29 MED ORDER — PEG-KCL-NACL-NASULF-NA ASC-C 100 G PO SOLR: 1.0000 | Freq: Once | ORAL | Status: DC

## 2010-10-29 NOTE — Telephone Encounter (Signed)
Notified to hold coumadin tonight then start 7 mg daily. Coumadin 2 mg tabs called to [facility]. PT level Monday AM.

## 2010-10-29 NOTE — Progress Notes (Signed)
Subjective:     Patient ID: [name], female   DOB: Jul 01, 1950, 60 y.o.   MRN: [id number]  HPI  [name] is a 60 yr old female referred to our office by Neijstrom for anemia/black stools. .     .  She tells me that she has always been anemic.  In 2006 she was anemic and received 6 units of blood at [facility].  She underwent a colonoscopy in 2006  by Dr. Linna Darner and it was normal.   She has received one iron infusion at [facility] in [place].  .  She had been taking  Advil.  She tells me that her stools are brown.  She tells me that her stools were black the last of August but stool samples were negative for blood x 3.  Her appetite is good. No weight loss. She does have acid reflux at night.  No abdominal pain. She has not been on NSAIDs recently.   She underwent an EGD 06/07/2009 by Dr. Jena Gauss for melena and a drop in her hemoglobin in the setting of Advil use and Coumadin. Findings: A relatively large area of distal esophageal erosion with a central area of ulceration, consistent with ulcerative,erosive reflux esophagitis (this area could have bled to the point of producing melena recently).  A thick beefy Schatzki's ring, not manipulated. A moderate sized hiatal hernia, empty stomach, normal gastric mucosa, patent pylorus, normal D1-D3.  Dr. Jena Gauss also recommended she come back at a later date for an elective EGD/ED with dilatation of of her Schatzki's ring.  10/15/2010 H and H 10.4 and 33. MCV 89.  10/01/2010 Iron 17, TIBC 348, Sat 5, Ferritin 12. Review of Systems see hpi Current Outpatient Prescriptions  Medication Sig Dispense Refill  . acetaminophen (TYLENOL) 325 MG tablet Take 650 mg by mouth every 6 (six) hours as needed.        . ALPRAZolam (XANAX) 0.5 MG tablet Take 0.5 mg by mouth daily as needed.        . CYANOCOBALAMIN IJ Inject 1,000 mcg as directed every 30 (thirty) days.        . folic acid (FOLVITE) 1 MG tablet Take 1 mg by mouth daily.        . iron polysaccharides (NIFEREX)  150 MG capsule Take 150 mg by mouth daily. Last dose was Monday       . loratadine (CLARITIN) 10 MG tablet Take 10 mg by mouth daily.        Marland Kitchen omeprazole (PRILOSEC) 40 MG capsule Take 40 mg by mouth daily.        Marland Kitchen warfarin (COUMADIN) 7.5 MG tablet Take 7.5 mg by mouth daily.        Past Surgical History  Procedure Date  . Abdominal hysterectomy 1989   Past Medical History  Diagnosis Date  . Vitamin B12 deficiency     vit b12 1000 mcg monthly  . Cellulitis of left leg 2006  . Ulcer 05/2009    esophageal  . Clotting disorder     heterozygosity from factor v leiden  . Pernicious anemia 07/10/2010  . DVT (deep venous thrombosis) 07/10/2010  . Factor V Leiden   . History of blood clots     lower legs   History reviewed. No pertinent family history. History   Social History  . Marital Status: Married    Spouse Name: N/A    Number of Children: N/A  . Years of Education: N/A   Occupational History  .  Not on file.   Social History Main Topics  . Smoking status: Never Smoker   . Smokeless tobacco: Not on file  . Alcohol Use: No  . Drug Use: No  . Sexually Active: Not on file   Other Topics Concern  . Not on file   Social History Narrative  . No narrative on file   History   Social History Narrative  . No narrative on file   History reviewed. No pertinent family history. Family Status  Relation Status Death Age  . Mother Deceased     Parkinson  . Father Alive     Hip fx, Nursing Home  . Sister Alive     good health  . Brother Alive     good health  . Child      One deceased from MVC. One in good health.       Objective:   Physical Exam Filed Vitals:   10/29/10 1433  BP: 142/68  Pulse: 72  Temp: 98 F (36.7 C)  Height: 5' 7.5" (1.715 m)  Weight: 231 lb 8 oz (105.008 kg)    Alert and oriented. Skin warm and dry. Oral mucosa is moist. Natural teeth in good condition. Sclera anicteric, conjunctivae is pink. Thyroid not enlarged. No cervical  lymphadenopathy. Lungs clear. Heart regular rate and rhythm.  Abdomen is soft. Bowel sounds are positive. No hepatomegaly. No abdominal masses felt. No tenderness.  Very small amt of stool and guaiac negative.   Venous insufficiency to her left lower leg.  Drainage noted to dressing on left lower leg. . Patient is alert and oriented.      Assessment:    Anemia.  PUD needs to be ruled given prior hx of melena.  Colonic carcinoma also needs to be ruled out.  Schatzki's ring noted on EGD in May of 2011.     Plan:    Will schedule and EGD/ED and colonoscopy with Dr. Karilyn Cota.

## 2010-10-29 NOTE — Progress Notes (Signed)
Labs drawn today for pt.  Patient on 7.5mg  qd.  Call patient at [phone]

## 2010-11-03 ENCOUNTER — Other Ambulatory Visit (HOSPITAL_COMMUNITY): Payer: Medicare Other

## 2010-11-03 LAB — PROTIME-INR
INR: 2.6 — ABNORMAL HIGH
Prothrombin Time: 29.5 — ABNORMAL HIGH

## 2010-11-03 NOTE — Telephone Encounter (Signed)
Patient need movi prep 

## 2010-11-04 LAB — PROTIME-INR
INR: 3.2 — ABNORMAL HIGH
Prothrombin Time: 34.4 — ABNORMAL HIGH

## 2010-11-06 LAB — PROTIME-INR
INR: 2.4 — ABNORMAL HIGH
Prothrombin Time: 27 — ABNORMAL HIGH

## 2010-11-06 LAB — CBC
Platelets: 377
RBC: 4.33
WBC: 5.4

## 2010-11-06 LAB — VITAMIN B12: Vitamin B-12: 475 (ref 211–911)

## 2010-11-07 LAB — DIFFERENTIAL
Basophils Relative: 1
Lymphocytes Relative: 45
Lymphs Abs: 2.3
Monocytes Relative: 10
Neutro Abs: 2.2
Neutrophils Relative %: 42 — ABNORMAL LOW

## 2010-11-07 LAB — CBC
MCHC: 33.4
RBC: 4.09
WBC: 5.2

## 2010-11-07 LAB — PROTIME-INR: INR: 3.2 — ABNORMAL HIGH

## 2010-11-10 LAB — PROTIME-INR
INR: 2.9 — ABNORMAL HIGH
INR: 2.4 — ABNORMAL HIGH
Prothrombin Time: 32 — ABNORMAL HIGH

## 2010-11-11 ENCOUNTER — Telehealth (HOSPITAL_COMMUNITY): Payer: Self-pay

## 2010-11-11 ENCOUNTER — Other Ambulatory Visit (HOSPITAL_COMMUNITY): Payer: Self-pay | Admitting: Oncology

## 2010-11-11 ENCOUNTER — Encounter (HOSPITAL_BASED_OUTPATIENT_CLINIC_OR_DEPARTMENT_OTHER): Payer: Medicare Other

## 2010-11-11 DIAGNOSIS — D682 Hereditary deficiency of other clotting factors: Secondary | ICD-10-CM

## 2010-11-11 NOTE — Progress Notes (Signed)
Labs drawn today for pt.  Patient on 7mg  of coud.  Call patient at [phone].

## 2010-11-11 NOTE — Telephone Encounter (Signed)
Notes Recorded by Dellis Anes, PA on 11/11/2010 at 11:11 AM Same dose  PT/INR in 10 days   1634 Patient notified to continue coumadin 7 mg daily and to RTC on 10/26 for next PT level.Terrance Mass

## 2010-11-21 ENCOUNTER — Telehealth (HOSPITAL_COMMUNITY): Payer: Self-pay | Admitting: *Deleted

## 2010-11-21 ENCOUNTER — Other Ambulatory Visit (HOSPITAL_COMMUNITY): Payer: Self-pay | Admitting: Oncology

## 2010-11-21 ENCOUNTER — Encounter (HOSPITAL_COMMUNITY): Payer: Medicare Other

## 2010-11-21 DIAGNOSIS — D682 Hereditary deficiency of other clotting factors: Secondary | ICD-10-CM

## 2010-11-21 DIAGNOSIS — I82409 Acute embolism and thrombosis of unspecified deep veins of unspecified lower extremity: Secondary | ICD-10-CM

## 2010-11-21 DIAGNOSIS — D6851 Activated protein C resistance: Secondary | ICD-10-CM

## 2010-11-21 LAB — PROTIME-INR: INR: 2.53 — ABNORMAL HIGH (ref 0.00–1.49)

## 2010-11-21 NOTE — Telephone Encounter (Signed)
Message left on answering machine as below. 

## 2010-11-21 NOTE — Telephone Encounter (Signed)
Message copied by Dennie Maizes on Fri Nov 21, 2010  3:40 PM ------      Message from: Ellouise Newer III      Created: Fri Nov 21, 2010 12:31 PM       Same dose Coumadin            PT/INR in 2 weeks

## 2010-11-21 NOTE — Progress Notes (Signed)
Labs drawn today for pt.  Patient on 7mg  qd.  Call patient at [phone]

## 2010-12-01 ENCOUNTER — Encounter (HOSPITAL_COMMUNITY): Payer: Self-pay | Admitting: Pharmacy Technician

## 2010-12-03 ENCOUNTER — Telehealth (HOSPITAL_COMMUNITY): Payer: Self-pay | Admitting: *Deleted

## 2010-12-03 DIAGNOSIS — D682 Hereditary deficiency of other clotting factors: Secondary | ICD-10-CM

## 2010-12-03 NOTE — Telephone Encounter (Signed)
Pt notified to call clinic to set up PT level 10 days after starting coumadin

## 2010-12-10 MED ORDER — SODIUM CHLORIDE 0.45 % IV SOLN
Freq: Once | INTRAVENOUS | Status: AC
  Administered 2010-12-11: 1000 mL via INTRAVENOUS

## 2010-12-11 ENCOUNTER — Ambulatory Visit (HOSPITAL_COMMUNITY)
Admission: RE | Admit: 2010-12-11 | Discharge: 2010-12-11 | Disposition: A | Discharge status: Home / Self Care | Payer: Medicare Other | Source: Ambulatory Visit | Attending: Internal Medicine | Admitting: Internal Medicine

## 2010-12-11 ENCOUNTER — Encounter (HOSPITAL_COMMUNITY)
Admission: RE | Disposition: A | Discharge status: Home / Self Care | Payer: Self-pay | Source: Ambulatory Visit | Attending: Internal Medicine

## 2010-12-11 ENCOUNTER — Other Ambulatory Visit (HOSPITAL_COMMUNITY): Payer: Self-pay | Admitting: *Deleted

## 2010-12-11 ENCOUNTER — Encounter (HOSPITAL_COMMUNITY): Payer: Self-pay | Admitting: *Deleted

## 2010-12-11 DIAGNOSIS — K2970 Gastritis, unspecified, without bleeding: Secondary | ICD-10-CM

## 2010-12-11 DIAGNOSIS — K2990 Gastroduodenitis, unspecified, without bleeding: Secondary | ICD-10-CM

## 2010-12-11 DIAGNOSIS — K210 Gastro-esophageal reflux disease with esophagitis: Secondary | ICD-10-CM

## 2010-12-11 DIAGNOSIS — D509 Iron deficiency anemia, unspecified: Secondary | ICD-10-CM | POA: Insufficient documentation

## 2010-12-11 DIAGNOSIS — Z7901 Long term (current) use of anticoagulants: Secondary | ICD-10-CM | POA: Insufficient documentation

## 2010-12-11 DIAGNOSIS — K219 Gastro-esophageal reflux disease without esophagitis: Secondary | ICD-10-CM

## 2010-12-11 DIAGNOSIS — D649 Anemia, unspecified: Secondary | ICD-10-CM

## 2010-12-11 DIAGNOSIS — K5730 Diverticulosis of large intestine without perforation or abscess without bleeding: Secondary | ICD-10-CM | POA: Insufficient documentation

## 2010-12-11 DIAGNOSIS — R1310 Dysphagia, unspecified: Secondary | ICD-10-CM

## 2010-12-11 DIAGNOSIS — K2210 Ulcer of esophagus without bleeding: Secondary | ICD-10-CM

## 2010-12-11 DIAGNOSIS — K222 Esophageal obstruction: Secondary | ICD-10-CM | POA: Insufficient documentation

## 2010-12-11 HISTORY — DX: Anxiety disorder, unspecified: F41.9

## 2010-12-11 HISTORY — PX: BALLOON DILATION: SHX5330

## 2010-12-11 SURGERY — COLONOSCOPY WITH ESOPHAGOGASTRODUODENOSCOPY (EGD)
Anesthesia: Moderate Sedation

## 2010-12-11 MED ORDER — MEPERIDINE HCL 50 MG/ML IJ SOLN
INTRAMUSCULAR | Status: AC
  Filled 2010-12-11: qty 1

## 2010-12-11 MED ORDER — MIDAZOLAM HCL 5 MG/5ML IJ SOLN
INTRAMUSCULAR | Status: DC | PRN
  Administered 2010-12-11: 2 mg via INTRAVENOUS
  Administered 2010-12-11: 1 mg via INTRAVENOUS
  Administered 2010-12-11 (×7): 2 mg via INTRAVENOUS

## 2010-12-11 MED ORDER — OMEPRAZOLE 40 MG PO CPDR: 40.0000 mg | DELAYED_RELEASE_CAPSULE | Freq: Two times a day (BID) | ORAL | Status: DC

## 2010-12-11 MED ORDER — ACETAMINOPHEN 325 MG PO TABS: 650.0000 mg | ORAL_TABLET | Freq: Once | ORAL | Status: DC

## 2010-12-11 MED ORDER — BUTAMBEN-TETRACAINE-BENZOCAINE 2-2-14 % EX AERO
INHALATION_SPRAY | CUTANEOUS | Status: DC | PRN
  Administered 2010-12-11: 2 via TOPICAL

## 2010-12-11 MED ORDER — STERILE WATER FOR IRRIGATION IR SOLN
Status: DC | PRN
  Administered 2010-12-11: 12:00:00

## 2010-12-11 MED ORDER — MIDAZOLAM HCL 5 MG/5ML IJ SOLN
INTRAMUSCULAR | Status: AC
  Filled 2010-12-11: qty 10

## 2010-12-11 MED ORDER — MIDAZOLAM HCL 5 MG/5ML IJ SOLN
INTRAMUSCULAR | Status: AC
  Filled 2010-12-11: qty 5

## 2010-12-11 MED ORDER — MEPERIDINE HCL 50 MG/ML IJ SOLN
INTRAMUSCULAR | Status: DC | PRN
Start: 2010-12-11 — End: 2010-12-11
  Administered 2010-12-11 (×4): 25 mg

## 2010-12-11 MED ORDER — DIPHENHYDRAMINE HCL 25 MG PO TABS: 25.0000 mg | ORAL_TABLET | Freq: Once | ORAL | Status: DC

## 2010-12-11 NOTE — OR Nursing (Signed)
Dr Karilyn Cota notified Hgb 6.5, Hct 20.8.  Dr Karilyn Cota to contact patient to set up blood transfusion.  No orders given.

## 2010-12-11 NOTE — Op Note (Signed)
ESOPHAGOGASTRODUODENOSCOPY  AND COLONOSCOPY   PROCEDURE REPORT  PATIENT:  [name]  MR#:  [id number] Birthdate:  10-27-1950, 60 y.o., female Endoscopist:  Dr. Malissa Hippo, MD Referred By:  Dr. Glenford Peers, MD. Procedure Date: 12/11/2010  Procedure:   EGD & Colonoscopy  Indications:  Patient is 60 year old Caucasian female with history of GI bleed and iron deficiency anemia. She is chronically anticoagulated because of history of DVT and factor V deficiency. She is been having intermittent melena and solid food dysphagia. About 18 months ago she was diagnosed with ulcerative esophagitis and Schatzki's ring. She's been maintained on omeprazole denies frequent heartburn. Her initial endoscopic evaluation was 6 years ago when she had 6 unit bleed workup was negative. At that time she was evaluated at [facility] in [facility].            Informed Consent:  Both the procedures and risks were reviewed with the patient and informed consent was obtained Medications:  Demerol 100 mg IV Versed 17 mg IV Cetacaine spray topically for oropharyngeal anesthesia  EGD  Description of procedure:  The endoscope was introduced through the mouth and advanced to the second portion of the duodenum without difficulty or limitations. The mucosal surfaces were surveyed very carefully during advancement of the scope and upon withdrawal.  Findings:  Esophagus:  Mucosa of the proximal and middle segment was normal. In the distal 4-5 cm she had scattered erosions and a small frondular ulcer at GE junction with prominent Schatzki's ring. GEJ:  32 cm Hiatus:  36 cm Stomach:  Stomach was empty and distended very well with insufflation. Folds in the proximal stomach were normal. Examination of mucosa at body was normal. She had focal edema erythema and two erosions in the prepyloric region. Angularis fundus and cardia were examined by retroflexing the scope and were normal. Duodenum:  Normal bulbar and post  bulbar  Therapeutic/Diagnostic Maneuvers Performed:  Esophageal ring was disrupted using a balloon dilator which was insufflated to a diameter of 18 mm.  COLONOSCOPY Description of procedure:  After a digital rectal exam was performed, that colonoscope was advanced from the anus through the rectum and colon to the area of the cecum, ileocecal valve and appendiceal orifice. The cecum was deeply intubated. These structures were well-seen and photographed for the record. From the level of the cecum and ileocecal valve, the scope was slowly and cautiously withdrawn. The mucosal surfaces were carefully surveyed utilizing scope tip to flexion to facilitate fold flattening as needed. The scope was pulled down into the rectum where a thorough exam including retroflexion was performed.  Findings:   Prep satisfactory. Redundant colon with few diverticula at sigmoid colon. No polyps or other mucosal abnormalities were noted. Rectal mucosa was normal. Anorectal junction unremarkable.  Therapeutic/Diagnostic Maneuvers Performed:  None  Complications:  None  Cecal Withdrawal Time:  10 minutes  Impression:  Erosive/ulcerative reflux esophagitis with high grade ring at GE junction which was dilated disrupted with a balloon to 18 mm. Esophagitis is virtually unchanged since her previous EGD of may 2011. Erosive antral gastritis. Redundant colon with few diverticula at sigmoid colon. Suspect she may be losing blood from ulcer at GE junction in the setting of chronic anticoagulation.  Recommendations:  Continue anti-reflux measures and try to lose some weight. Increase omeprazole to 40 mg by mouth twice daily. We will check her CBC and H. pylori serology today. Patient will resume her Coumadin at usual dose and get her INR checked in  10-14 days. I will be contacting patient with results of lab studies.  [name]  12/11/2010 1:30 PM  CC: Dr. Glenford Peers, MD.         Dr. Monica Becton, MD, MD &

## 2010-12-11 NOTE — H&P (Signed)
[name] is an 60 y.o. female.   Chief Complaint: Patient is here for EGD ED and colonoscopy. HPI: Patient is 60 year old Caucasian female with history of GI bleed and iron deficiency anemia. Her initial episode occurred in 2006 when she had 6 unit bleed. She was cared for at [facility] in [facility] and her workup was negative. She had EGD and a colonoscopy. This year she has had intermittent tarry stools. Last episode was in August. Since then she's had for Hemoccults and have been negative. She was documented to have iron deficiency anemia by Dr. Laurie Panda and has received iron infusion. Her hemoglobin on 10/15/2010 was 10.4. He denies abdominal pain or chronic diarrhea. Patient is chronically anticoagulated because of history of left DVT and factor V deficiency.  Past Medical History  Diagnosis Date  . Vitamin B12 deficiency     vit b12 1000 mcg monthly  . Cellulitis of left leg 2006  . Ulcer 05/2009    esophageal  . Clotting disorder     heterozygosity from factor v leiden  . Pernicious anemia 07/10/2010  . DVT (deep venous thrombosis) 07/10/2010  . Factor V Leiden   . History of blood clots     lower legs  . Blood dyscrasia   . Anxiety     Past Surgical History  Procedure Date  . Abdominal hysterectomy 1989    History reviewed. No pertinent family history. Social History:  reports that she has never smoked. She does not have any smokeless tobacco history on file. She reports that she does not drink alcohol or use illicit drugs.  Allergies:  Allergies  Allergen Reactions  . Cephalexin Swelling  . Dexlansoprazole Swelling  . Latex Itching  . Other Swelling    Patient states that Pecans cause her mouth to swell.  . Pantoprazole Sodium Swelling  . Penicillins Swelling  . Lovenox Itching, Swelling and Palpitations  . Nylon Rash  . Sulfonamide Derivatives Rash    Medications Prior to Admission  Medication Dose Route Frequency Provider Last Rate Last  Dose  . 0.45 % sodium chloride infusion   Intravenous Once Malissa Hippo, MD 50 mL/hr at 12/11/10 1101 1,000 mL at 12/11/10 1101  . meperidine (DEMEROL) 50 MG/ML injection           . midazolam (VERSED) 5 MG/5ML injection           . simethicone susp in sterile water 1000 mL irrigation    PRN Malissa Hippo, MD       Medications Prior to Admission  Medication Sig Dispense Refill  . acetaminophen (TYLENOL) 325 MG tablet Take 650 mg by mouth every 6 (six) hours as needed. Pain      . ALPRAZolam (XANAX) 0.5 MG tablet Take 0.5 mg by mouth daily.       . CYANOCOBALAMIN IJ Inject 1,000 mcg as directed every 30 (thirty) days.        . folic acid (FOLVITE) 1 MG tablet Take 1 mg by mouth daily.        . iron polysaccharides (NIFEREX) 150 MG capsule Take 150 mg by mouth daily.         No results found for this or any previous visit (from the past 48 hour(s)). No results found.  Review of Systems  Constitutional: Negative for weight loss.  Gastrointestinal: Positive for melena. Negative for abdominal pain, diarrhea, constipation and blood in stool.       Intermittent solid food dysphagia and  known Schatzki's ring  Genitourinary: Negative for hematuria.    Blood pressure 130/76, pulse 69, temperature 97 F (36.1 C), temperature source Oral, resp. rate 20, height 5\' 7"  (1.702 m), weight 231 lb (104.781 kg), SpO2 100.00%. Physical Exam  Constitutional: She appears well-developed and well-nourished.  HENT:  Mouth/Throat: Oropharynx is clear and moist.  Eyes: Conjunctivae are normal. No scleral icterus.  Neck: No thyromegaly present.  Cardiovascular: Normal rate, regular rhythm and normal heart sounds.   No murmur heard. Respiratory: Effort normal and breath sounds normal.  GI: Soft. She exhibits no distension and no mass. There is no tenderness.  Musculoskeletal: Edema: edema stasis dermatitis and ulcer at left leg.  Lymphadenopathy:    She has no cervical adenopathy.  Neurological: She  is alert.  Skin: Skin is warm and dry.     Assessment/Plan Iron deficiency anemia. Dysphagia. EGD possible ED and colonoscopy.  Deionte Spivack U 12/11/2010, 12:05 PM

## 2010-12-12 ENCOUNTER — Encounter (HOSPITAL_COMMUNITY): Payer: Medicare Other

## 2010-12-12 ENCOUNTER — Other Ambulatory Visit (INDEPENDENT_AMBULATORY_CARE_PROVIDER_SITE_OTHER): Payer: Self-pay | Admitting: Internal Medicine

## 2010-12-12 ENCOUNTER — Encounter (HOSPITAL_COMMUNITY): Payer: Medicare Other | Attending: Internal Medicine

## 2010-12-12 DIAGNOSIS — D649 Anemia, unspecified: Secondary | ICD-10-CM

## 2010-12-12 DIAGNOSIS — D509 Iron deficiency anemia, unspecified: Secondary | ICD-10-CM

## 2010-12-12 DIAGNOSIS — D682 Hereditary deficiency of other clotting factors: Secondary | ICD-10-CM | POA: Insufficient documentation

## 2010-12-12 LAB — HEMOGLOBIN AND HEMATOCRIT, BLOOD: Hemoglobin: 12.6 g/dL (ref 12.0–15.0)

## 2010-12-12 LAB — H. PYLORI ANTIBODY, IGG: H Pylori IgG: <0.4 {ISR}

## 2010-12-12 LAB — CBC

## 2010-12-12 NOTE — Progress Notes (Signed)
Labs drawn today for type and cross 

## 2010-12-12 NOTE — Progress Notes (Signed)
Report has been sent to Dr. Mariel Sleet.

## 2010-12-15 ENCOUNTER — Encounter (HOSPITAL_COMMUNITY): Payer: Medicare Other

## 2010-12-15 NOTE — Progress Notes (Signed)
Notes were sent to Dr. Mariel Sleet on 12/11/10.

## 2010-12-15 NOTE — Progress Notes (Signed)
Apt made for 03/17/11 at 10:45 am.

## 2010-12-16 ENCOUNTER — Other Ambulatory Visit (HOSPITAL_COMMUNITY): Payer: Medicare Other

## 2010-12-16 ENCOUNTER — Encounter (INDEPENDENT_AMBULATORY_CARE_PROVIDER_SITE_OTHER): Payer: Self-pay | Admitting: *Deleted

## 2010-12-16 ENCOUNTER — Telehealth (INDEPENDENT_AMBULATORY_CARE_PROVIDER_SITE_OTHER): Payer: Self-pay | Admitting: Internal Medicine

## 2010-12-16 NOTE — Telephone Encounter (Signed)
Will try Ceri on Aciphex. Stop the Prilosec for now.

## 2010-12-16 NOTE — Telephone Encounter (Signed)
[name] said the Omeprazole is breaking her out in hives. Patient is taking benadryl. Please return the call to [phone].

## 2010-12-22 ENCOUNTER — Encounter (HOSPITAL_COMMUNITY): Payer: Self-pay | Admitting: Internal Medicine

## 2010-12-25 ENCOUNTER — Telehealth (HOSPITAL_COMMUNITY): Payer: Self-pay | Admitting: *Deleted

## 2010-12-25 ENCOUNTER — Other Ambulatory Visit (HOSPITAL_COMMUNITY): Payer: Self-pay | Admitting: Oncology

## 2010-12-25 ENCOUNTER — Encounter (HOSPITAL_BASED_OUTPATIENT_CLINIC_OR_DEPARTMENT_OTHER): Payer: Medicare Other

## 2010-12-25 DIAGNOSIS — D682 Hereditary deficiency of other clotting factors: Secondary | ICD-10-CM

## 2010-12-25 DIAGNOSIS — D6859 Other primary thrombophilia: Secondary | ICD-10-CM

## 2010-12-25 LAB — PROTIME-INR
INR: 2.52 — ABNORMAL HIGH (ref 0.00–1.49)
Prothrombin Time: 27.6 seconds — ABNORMAL HIGH (ref 11.6–15.2)

## 2010-12-25 NOTE — Progress Notes (Signed)
Pt to continue same dose of coumadin. Return in 4 weeks for pt/inr 12/28 @ 9:40.

## 2010-12-25 NOTE — Progress Notes (Signed)
Labs drawn today for pt.  Patient on 7mg .  Call patient at [phone]

## 2010-12-26 ENCOUNTER — Ambulatory Visit (HOSPITAL_COMMUNITY): Payer: Medicare Other | Admitting: Oncology

## 2011-01-01 ENCOUNTER — Encounter (HOSPITAL_COMMUNITY): Payer: Self-pay | Admitting: *Deleted

## 2011-01-01 ENCOUNTER — Other Ambulatory Visit: Payer: Self-pay

## 2011-01-01 ENCOUNTER — Inpatient Hospital Stay (HOSPITAL_COMMUNITY)
Admission: EM | Admit: 2011-01-01 | Discharge: 2011-01-11 | DRG: 982 | Disposition: A | Discharge status: Home / Self Care | Payer: Medicare Other | Attending: General Surgery | Admitting: General Surgery

## 2011-01-01 ENCOUNTER — Emergency Department (HOSPITAL_COMMUNITY): Payer: Medicare Other

## 2011-01-01 DIAGNOSIS — L97909 Non-pressure chronic ulcer of unspecified part of unspecified lower leg with unspecified severity: Secondary | ICD-10-CM | POA: Diagnosis present

## 2011-01-01 DIAGNOSIS — K2100 Gastro-esophageal reflux disease with esophagitis, without bleeding: Secondary | ICD-10-CM | POA: Diagnosis present

## 2011-01-01 DIAGNOSIS — S0181XA Laceration without foreign body of other part of head, initial encounter: Secondary | ICD-10-CM | POA: Diagnosis present

## 2011-01-01 DIAGNOSIS — D689 Coagulation defect, unspecified: Secondary | ICD-10-CM

## 2011-01-01 DIAGNOSIS — D481 Neoplasm of uncertain behavior of connective and other soft tissue: Principal | ICD-10-CM | POA: Diagnosis present

## 2011-01-01 DIAGNOSIS — D500 Iron deficiency anemia secondary to blood loss (chronic): Secondary | ICD-10-CM | POA: Diagnosis present

## 2011-01-01 DIAGNOSIS — D510 Vitamin B12 deficiency anemia due to intrinsic factor deficiency: Secondary | ICD-10-CM | POA: Diagnosis present

## 2011-01-01 DIAGNOSIS — D6851 Activated protein C resistance: Secondary | ICD-10-CM | POA: Diagnosis present

## 2011-01-01 DIAGNOSIS — D509 Iron deficiency anemia, unspecified: Secondary | ICD-10-CM

## 2011-01-01 DIAGNOSIS — E876 Hypokalemia: Secondary | ICD-10-CM | POA: Diagnosis not present

## 2011-01-01 DIAGNOSIS — R918 Other nonspecific abnormal finding of lung field: Secondary | ICD-10-CM | POA: Diagnosis present

## 2011-01-01 DIAGNOSIS — D4819 Other specified neoplasm of uncertain behavior of connective and other soft tissue: Principal | ICD-10-CM | POA: Diagnosis present

## 2011-01-01 DIAGNOSIS — L97929 Non-pressure chronic ulcer of unspecified part of left lower leg with unspecified severity: Secondary | ICD-10-CM | POA: Diagnosis present

## 2011-01-01 DIAGNOSIS — R9389 Abnormal findings on diagnostic imaging of other specified body structures: Secondary | ICD-10-CM | POA: Diagnosis present

## 2011-01-01 DIAGNOSIS — I82409 Acute embolism and thrombosis of unspecified deep veins of unspecified lower extremity: Secondary | ICD-10-CM | POA: Diagnosis present

## 2011-01-01 DIAGNOSIS — Z7901 Long term (current) use of anticoagulants: Secondary | ICD-10-CM

## 2011-01-01 DIAGNOSIS — C49A0 Gastrointestinal stromal tumor, unspecified site: Secondary | ICD-10-CM | POA: Diagnosis present

## 2011-01-01 DIAGNOSIS — S0180XA Unspecified open wound of other part of head, initial encounter: Secondary | ICD-10-CM | POA: Diagnosis present

## 2011-01-01 DIAGNOSIS — K922 Gastrointestinal hemorrhage, unspecified: Secondary | ICD-10-CM | POA: Diagnosis present

## 2011-01-01 DIAGNOSIS — D62 Acute posthemorrhagic anemia: Secondary | ICD-10-CM | POA: Diagnosis present

## 2011-01-01 DIAGNOSIS — R220 Localized swelling, mass and lump, head: Secondary | ICD-10-CM | POA: Diagnosis not present

## 2011-01-01 DIAGNOSIS — Z86718 Personal history of other venous thrombosis and embolism: Secondary | ICD-10-CM

## 2011-01-01 DIAGNOSIS — W19XXXA Unspecified fall, initial encounter: Secondary | ICD-10-CM | POA: Diagnosis present

## 2011-01-01 DIAGNOSIS — L500 Allergic urticaria: Secondary | ICD-10-CM | POA: Diagnosis not present

## 2011-01-01 DIAGNOSIS — D6859 Other primary thrombophilia: Secondary | ICD-10-CM | POA: Diagnosis present

## 2011-01-01 DIAGNOSIS — R55 Syncope and collapse: Secondary | ICD-10-CM | POA: Diagnosis present

## 2011-01-01 DIAGNOSIS — D682 Hereditary deficiency of other clotting factors: Secondary | ICD-10-CM

## 2011-01-01 HISTORY — DX: Allergic urticaria: L50.0

## 2011-01-01 HISTORY — DX: Other specified diseases of intestine: K63.89

## 2011-01-01 LAB — MRSA PCR SCREENING: MRSA by PCR: NEGATIVE

## 2011-01-01 LAB — DIFFERENTIAL
Basophils Absolute: 0 10*3/uL (ref 0.0–0.1)
Basophils Relative: 0 % (ref 0–1)
Eosinophils Absolute: 0.1 10*3/uL (ref 0.0–0.7)
Monocytes Absolute: 0.6 10*3/uL (ref 0.1–1.0)
Neutro Abs: 4.8 10*3/uL (ref 1.7–7.7)

## 2011-01-01 LAB — CBC
HCT: 28.7 % — ABNORMAL LOW (ref 36.0–46.0)
MCH: 27.6 pg (ref 26.0–34.0)
MCHC: 31.7 g/dL (ref 30.0–36.0)
RDW: 15 % (ref 11.5–15.5)

## 2011-01-01 LAB — BASIC METABOLIC PANEL
BUN: 21 mg/dL (ref 6–23)
Calcium: 8 mg/dL — ABNORMAL LOW (ref 8.4–10.5)
Chloride: 113 mEq/L — ABNORMAL HIGH (ref 96–112)
Creatinine, Ser: 0.68 mg/dL (ref 0.50–1.10)
GFR calc Af Amer: >90 mL/min (ref >90)
GFR calc non Af Amer: >90 mL/min (ref >90)

## 2011-01-01 LAB — PREPARE RBC (CROSSMATCH)

## 2011-01-01 LAB — PROTIME-INR: Prothrombin Time: 37.1 seconds — ABNORMAL HIGH (ref 11.6–15.2)

## 2011-01-01 LAB — HEMOGLOBIN AND HEMATOCRIT, BLOOD
HCT: 27.3 % — ABNORMAL LOW (ref 36.0–46.0)
Hemoglobin: 8.1 g/dL — ABNORMAL LOW (ref 12.0–15.0)

## 2011-01-01 MED ORDER — PANTOPRAZOLE SODIUM 40 MG IV SOLR: 40.0000 mg | Freq: Once | INTRAVENOUS | Status: DC

## 2011-01-01 MED ORDER — DIPHENHYDRAMINE HCL 25 MG PO CAPS
25.0000 mg | ORAL_CAPSULE | Freq: Once | ORAL | Status: AC
  Administered 2011-01-01: 25 mg via ORAL
  Filled 2011-01-01: qty 1

## 2011-01-01 MED ORDER — HYDROMORPHONE HCL PF 1 MG/ML IJ SOLN
0.5000 mg | INTRAMUSCULAR | Status: DC | PRN
  Administered 2011-01-02 (×2): 0.5 mg via INTRAVENOUS
  Filled 2011-01-01 (×2): qty 1

## 2011-01-01 MED ORDER — ACETAMINOPHEN 325 MG PO TABS
650.0000 mg | ORAL_TABLET | Freq: Once | ORAL | Status: AC
  Administered 2011-01-01: 650 mg via ORAL
  Filled 2011-01-01: qty 2

## 2011-01-01 MED ORDER — ALPRAZOLAM 0.5 MG PO TABS
0.5000 mg | ORAL_TABLET | Freq: Every day | ORAL | Status: DC
  Administered 2011-01-01 – 2011-01-10 (×10): 0.5 mg via ORAL
  Filled 2011-01-01 (×10): qty 1

## 2011-01-01 MED ORDER — SODIUM CHLORIDE 0.9 % IV SOLN
INTRAVENOUS | Status: DC
  Administered 2011-01-01: 200 mL/h via INTRAVENOUS
  Administered 2011-01-01 – 2011-01-02 (×2): via INTRAVENOUS

## 2011-01-01 MED ORDER — FAMOTIDINE IN NACL 20-0.9 MG/50ML-% IV SOLN
20.0000 mg | Freq: Two times a day (BID) | INTRAVENOUS | Status: DC
  Administered 2011-01-01 – 2011-01-02 (×3): 20 mg via INTRAVENOUS
  Filled 2011-01-01 (×10): qty 50

## 2011-01-01 MED ORDER — ONDANSETRON HCL 4 MG PO TABS: 4.0000 mg | ORAL_TABLET | Freq: Four times a day (QID) | ORAL | Status: DC | PRN

## 2011-01-01 MED ORDER — ONDANSETRON HCL 4 MG/2ML IJ SOLN
4.0000 mg | Freq: Once | INTRAMUSCULAR | Status: AC
  Administered 2011-01-01: 4 mg via INTRAVENOUS
  Filled 2011-01-01: qty 2

## 2011-01-01 MED ORDER — LEVOFLOXACIN IN D5W 750 MG/150ML IV SOLN
750.0000 mg | Freq: Once | INTRAVENOUS | Status: AC
  Administered 2011-01-01: 750 mg via INTRAVENOUS
  Filled 2011-01-01: qty 150

## 2011-01-01 MED ORDER — SODIUM CHLORIDE 0.9 % IV SOLN
Freq: Once | INTRAVENOUS | Status: AC
  Administered 2011-01-01: 09:00:00 via INTRAVENOUS

## 2011-01-01 MED ORDER — ACETAMINOPHEN 325 MG PO TABS: 650.0000 mg | ORAL_TABLET | Freq: Four times a day (QID) | ORAL | Status: DC | PRN

## 2011-01-01 MED ORDER — FAMOTIDINE IN NACL 20-0.9 MG/50ML-% IV SOLN
20.0000 mg | Freq: Once | INTRAVENOUS | Status: AC
  Filled 2011-01-01: qty 50

## 2011-01-01 MED ORDER — VITAMIN K1 10 MG/ML IJ SOLN
10.0000 mg | Freq: Once | INTRAVENOUS | Status: AC
  Administered 2011-01-01: 10 mg via INTRAVENOUS
  Filled 2011-01-01: qty 1

## 2011-01-01 MED ORDER — TRAZODONE HCL 50 MG PO TABS: 50.0000 mg | ORAL_TABLET | Freq: Every evening | ORAL | Status: DC | PRN

## 2011-01-01 MED ORDER — VANCOMYCIN HCL IN DEXTROSE 1-5 GM/200ML-% IV SOLN: 1000.0000 mg | Freq: Once | INTRAVENOUS | Status: DC

## 2011-01-01 MED ORDER — ONDANSETRON HCL 4 MG/2ML IJ SOLN
4.0000 mg | Freq: Four times a day (QID) | INTRAMUSCULAR | Status: DC | PRN
  Administered 2011-01-02: 4 mg via INTRAVENOUS
  Filled 2011-01-01: qty 2

## 2011-01-01 NOTE — ED Provider Notes (Signed)
This chart was scribed for Donnetta Hutching, MD by Wallis Mart. The patient was seen in room APA18/AP[facility]18 and the patient's care was started at 8:10AM.   CSN: [id number] Arrival date & time: No admission date for patient encounter.   None     Chief Complaint  Patient presents with  . Rectal Bleeding    (Consider location/radiation/quality/duration/timing/severity/associated sxs/prior treatment) Patient is a 60 y.o. female presenting with hematochezia. The history is limited by the condition of the patient.  Rectal Bleeding   A Level 5 Caveat applies due to urgent need for intervention. BRANDALYN HARTING is a 60 y.o. female who presents to the Emergency Department via EMS complaining of sudden onset rectal bleeding.   Pt had a syncopal spell 6 hours ago. Per EMS, Pt was hypotensive at her house when they arrived (80/50). Pt denies abdominal pain.  Pt has a h/o Factor 5 Leiden and experienced a similar episode in 2006. Pt had an EGD, colonoscopy and esophageal diliation performed  the week before Thanksgiving. Patient also reports she is currently on Coumadin to treat h/o DVTs.   Past Medical History  Diagnosis Date  . Vitamin B12 deficiency     vit b12 1000 mcg monthly  . Cellulitis of left leg 2006  . Ulcer 05/2009    esophageal  . Clotting disorder     heterozygosity from factor v leiden  . Pernicious anemia 07/10/2010  . DVT (deep venous thrombosis) 07/10/2010  . Factor V Leiden   . History of blood clots     lower legs  . Blood dyscrasia   . Anxiety     Past Surgical History  Procedure Date  . Abdominal hysterectomy 1989  . Balloon dilation 12/11/2010    Procedure: BALLOON DILATION;  Surgeon: Malissa Hippo, MD;  Location: [facility];  Service: Endoscopy;  Laterality: N/A;    No family history on file.  History  Substance Use Topics  . Smoking status: Never Smoker   . Smokeless tobacco: Not on file  . Alcohol Use: No    OB History    Grav Para Term Preterm  Abortions TAB SAB Ect Mult Living                  Review of Systems  Gastrointestinal: Positive for hematochezia.  10 Systems reviewed and are negative for acute change except as noted in the HPI.   Allergies  Cephalexin; Dexlansoprazole; Latex; Other; Pantoprazole sodium; Penicillins; Lovenox; Nylon; and Sulfonamide derivatives  Home Medications   Current Outpatient Rx  Name Route Sig Dispense Refill  . ACETAMINOPHEN 325 MG PO TABS Oral Take 650 mg by mouth every 6 (six) hours as needed. Pain    . ALPRAZOLAM 0.5 MG PO TABS Oral Take 0.5 mg by mouth daily.     . CYANOCOBALAMIN IJ Injection Inject 1,000 mcg as directed every 30 (thirty) days.      Marland Kitchen DIPHENHYDRAMINE HCL 25 MG PO CAPS Oral Take 25 mg by mouth every 6 (six) hours as needed. Itching.     Marland Kitchen FOLIC ACID 1 MG PO TABS Oral Take 1 mg by mouth daily.      Marland Kitchen POLYSACCHARIDE IRON COMPLEX 150 MG PO CAPS Oral Take 150 mg by mouth daily.     Marland Kitchen OMEPRAZOLE 40 MG PO CPDR Oral Take 1 capsule (40 mg total) by mouth 2 (two) times daily before a meal. 60 capsule 5  . WARFARIN SODIUM 2 MG PO TABS Oral Take 2 mg by  mouth daily. Takes along with the 5 mg to create 7 mg daily.    . WARFARIN SODIUM 5 MG PO TABS Oral Take 5 mg by mouth daily. Takes along with the 2 mg to create 7 mg.      There were no vitals taken for this visit.  Physical Exam  Nursing note and vitals reviewed. Constitutional: She is oriented to person, place, and time. She appears well-developed and well-nourished. No distress.  HENT:  Head: Normocephalic and atraumatic.  Eyes: EOM are normal. Pupils are equal, round, and reactive to light.  Neck: Neck supple. No tracheal deviation present.  Cardiovascular: Normal rate.   Pulmonary/Chest: Effort normal. No respiratory distress.  Abdominal: She exhibits no distension.          Genitourinary:       Chaperone present for exam Rectum: grossly heme positive Maroon stool  Musculoskeletal: Normal range of motion. She  exhibits no edema.  Neurological: She is alert and oriented to person, place, and time. No sensory deficit.  Skin: Skin is warm and dry.  Psychiatric: She has a normal mood and affect. Her behavior is normal.    ED Course  Procedures (including critical care time) CRITICAL CARE Performed by: Donnetta Hutching, MD  Total critical care time:40  Critical care time was exclusive of separately billable procedures and treating other patients.  Critical care was necessary to treat or prevent imminent or life-threatening deterioration.  Critical care was time spent personally by me on the following activities: development of treatment plan with patient and/or surrogate as well as nursing, discussions with consultants, evaluation of patient's response to treatment, examination of patient, obtaining history from patient or surrogate, ordering and performing treatments and interventions, ordering and review of laboratory studies, ordering and review of radiographic studies, pulse oximetry and re-evaluation of patient's condition.  DIAGNOSTIC STUDIES: Oxygen Saturation is 100% on [place], normal by my interpretation.    COORDINATION OF CARE:  Results for orders placed during the hospital encounter of 01/01/11  CBC      Component Value Range   WBC 7.2  4.0 - 10.5 (K/uL)   RBC 3.30 (*) 3.87 - 5.11 (MIL/uL)   Hemoglobin 9.1 (*) 12.0 - 15.0 (g/dL)   HCT 60.4 (*) 54.0 - 46.0 (%)   MCV 87.0  78.0 - 100.0 (fL)   MCH 27.6  26.0 - 34.0 (pg)   MCHC 31.7  30.0 - 36.0 (g/dL)   RDW 98.1  19.1 - 47.8 (%)   Platelets 239  150 - 400 (K/uL)  DIFFERENTIAL      Component Value Range   Neutrophils Relative 66  43 - 77 (%)   Neutro Abs 4.8  1.7 - 7.7 (K/uL)   Lymphocytes Relative 25  12 - 46 (%)   Lymphs Abs 1.8  0.7 - 4.0 (K/uL)   Monocytes Relative 8  3 - 12 (%)   Monocytes Absolute 0.6  0.1 - 1.0 (K/uL)   Eosinophils Relative 1  0 - 5 (%)   Eosinophils Absolute 0.1  0.0 - 0.7 (K/uL)   Basophils Relative 0  0 - 1  (%)   Basophils Absolute 0.0  0.0 - 0.1 (K/uL)  BASIC METABOLIC PANEL      Component Value Range   Sodium 143  135 - 145 (mEq/L)   Potassium 4.1  3.5 - 5.1 (mEq/L)   Chloride 113 (*) 96 - 112 (mEq/L)   CO2 24  19 - 32 (mEq/L)   Glucose, Bld 125 (*)  70 - 99 (mg/dL)   BUN 21  6 - 23 (mg/dL)   Creatinine, Ser 1.61  0.50 - 1.10 (mg/dL)   Calcium 8.0 (*) 8.4 - 10.5 (mg/dL)   GFR calc non Af Amer >90  >90 (mL/min)   GFR calc Af Amer >90  >90 (mL/min)  PROTIME-INR      Component Value Range   Prothrombin Time 37.1 (*) 11.6 - 15.2 (seconds)   INR 3.68 (*) 0.00 - 1.49   APTT      Component Value Range   aPTT 41 (*) 24 - 37 (seconds)  TYPE AND SCREEN      Component Value Range   ABO/RH(D) A POS     Antibody Screen NEG     Sample Expiration 01/04/2011       Labs Reviewed  CBC  DIFFERENTIAL  BASIC METABOLIC PANEL  PROTIME-INR  APTT  TYPE AND SCREEN   No results found.   No diagnosis found.    MDM  Patient has known clotting disorder. Muscle on Coumadin for history of DVTs. Became syncopal in the middle the night after passing blood per rectum.  Blood pressure is improved from 82 greater than 100 systolic. She is pale but alert. Abdomen is soft. Rectal grossly heme positive. This x-ray shows possible pneumonia in left side. Will start vancomycin and levofloxacin secondary to cephalosporin allergy.   IV fluids started.  Discuss with the gastroenterologist and internal medicine. Admit     I personally performed the services described in this documentation, which was scribed in my presence. The recorded information has been reviewed and considered.        Donnetta Hutching, MD 01/01/11 [phone]

## 2011-01-01 NOTE — Progress Notes (Signed)
Called [facility]) and left message for consult.

## 2011-01-01 NOTE — ED Notes (Signed)
Pt c/o chills. Warm blanket given. CCM showing NSR. IV infusing with no edema or redness. Family at bedside.

## 2011-01-01 NOTE — Consult Note (Signed)
Reason for consultation; recurrent GI bleed and anemia in a patient with clotting disorder who is chronically anticoagulated. History of present illness; patient is 60 year old Caucasian female who was factor V deficiency and chronically anticoagulated who has history of GI bleed and iron deficiency anemia which is reviewed under under past medical history in detail who presented to the emergency room this morning with acute onset of rectal bleeding and syncopal episode. Patient underwent EGD with ED and colonoscopy by me on 12/11/2010. She was found to have erosive/ulcerative reflux esophagitis with high-grade ring at GE junction which was dilated with a balloon to 18 mm with resolution of her dysphagia. Colonoscopy was normal other than few diverticula at sigmoid colon. On that visit she had hemoglobin of 12 g. Patient was doing fine until 2 AM this morning when she woke up sick. She felt lightheaded but she thought it was because she jumped out of bed rapidly. She vomited food and fluid and felt better and went back to sleep. She woke up around 6 AM and went to the bathroom and passed large amount of dark or burgundy blood and before her husband could help her she passed out and fell. She was brought to the emergency room where she was evaluated by Dr. Adriana Simas and noted to have hemoglobin of 9.1 g. Blood pressure returned to normal with IV fluids. She was seen by Dr. Lendell Caprice and admitted to ICU for further evaluation. At the present time she feels fine. She has not experienced dysphagia since her esophageal dilation over 3 weeks ago. She denies abdominal pain. She has a good appetite and has not lost any weight. She states she has been feeling quite well lately and actually worked 6 hours yesterday at [employer]. She does not take any NSAIDs. Patient states that she could not tolerate AcipHex because she developed a rash and lip swelling. She experience similar side effects with Prilosec, pantoprazole and  Dexilant. She had chest x-ray in emergency room and felt to have pneumonia therefore begun on Levaquin. She denies chest pain or shortness of breath Home meds. Folic acid 1 mg by mouth daily Niferex 150 mg by mouth daily. B12 1 mg IM/SQ every 30 days. Alprazolam 0.5 mg by mouth daily. Benadryl 25 mg by mouth every 6 when necessary. Tylenol 650 mg by mouth every 6 when necessary. Warfarin 7.5 mg by mouth daily. Current medications; Famotidine 20 mg IV every 12 hours. Alprazolam oh 0.5 mg by mouth at bedtime. Hydromorphone oh 0.5 mg IV every 4 when necessary Zofran 4 mg IV every 6 when necessary. Trazodone 50 mg by mouth each bedtime when necessary. Levaquin 750 mg IV every 24 hours. Vitamin K 10 mg IV x1 given on admission Past medical history. Volume disorder secondary to factor V deficiency. History of recurrent DVT with stasis dermatitis and also involving left leg. This is healing. B12 deficiency. History of recurrent GI bleed and iron deficiency anemia. She had 6 unit GI bleed back in 2006 and was evaluated at [facility] [id number] [facility] with negative EGD and colonoscopy. She had 2 or 3 EGDs in 2 or 3 colonoscopies. She was admitted to [facility] in may 2011 with melena and anemia and seen by Dr. Jena Gauss. EGD revealed ulcerative reflux esophagitis Schatzki's ring and a moderate size sliding hiatal hernia. She was advised to return for elective EGD with ED but she could not until her evaluation last month by me when she had EGD and a colonoscopy on  12/11/2010 as above. Since her hemoglobin was normal decided not to pursue with given capsule study. She had hysterectomy in 1989. Allergies; Cephalexin, penicillin, Lovenox, latex, sulfa. She also experienced a rash itching and lip swelling with Prilosec, pantoprazole, Dexlansoprazole and rabeprazole. Family history; Negative for colon carcinoma or clotting disorders. Social history. She is married and and 2 children living; one  child killed in an auto accident 3 years ago. She is retired but works part-time at [employer]. She does not smoke cigarettes or drink alcohol. Physical examination; BP 109/54  Pulse 83  Temp(Src) 98 F (36.7 C) (Oral)  Resp 17  Ht 5' 7.5" (1.715 m)  Wt 233 lb 14.5 oz (106.1 kg)  BMI 36.09 kg/m2  SpO2 97% Patient is alert and oriented to place person and time. She appears pale. She has cut over forehead. Conjunctiva is pale. Sclera is nonicteric. Oropharyngeal mucosa is normal. No neck masses or thyromegaly noted. Cardiac exam with regular rhythm normal S1 and S2. No murmur or gallop noted. Lungs are clear to auscultation. Abdomen is full. Bowel sounds are normal. On palpation is soft and nontender without organomegaly or masses. Rectal exam is deferred as she had one in emergency room. No clubbing or koilonychia noted. This is dermatitis and superficial ulcers involving medial aspect of left leg above ankle appears to be healing. No LE edema noted Lab data. WBC 7.2, hemoglobin 9.1, hematocrit 28.7, MCV 87.0, and platelet count 239K. Serum sodium 143, potassium 4.1, chloride 113, CO2 24, glucose 125, BUN 21, creatinine oh 0.68, calcium 8.0. INR 3.68 and PT 37.1. INR was 2.52 on  12/25/2010. Assessment; Patient is 60 year old Caucasian female who presents with acute GI bleed. She passed dark burgundy blood and had syncopal episode at home her hemoglobin on admission was 9.1 where as it is was 12.6  g 3 weeks ago. Her GI bleed this morning was preceded by emesis of food debris 4 hours earlier. She is chronically anticoagulated secondary to factor V deficiency and her INR is high and she has received a dose of vitamin K. She just had EGD and a colonoscopy 3 weeks ago and most significant finding was one of ulcerative esophagitis which is chronic and persistent since she's been intolerant of PPIs. It is possible that she has bled from her upper GI tract although she did not have hematemesis.  Therefore we may need to focus on her small bowel with given capsule study. It is possible that GI bleed is the result of vomiting resulting in Mallory-Weiss tear. Her BUN however is not elevated. Agree with using IV H2Bfor now. Recommendations; Serial H&H's as you are doing. He possibly will need to be transfused will wait until her hemoglobin drops to 8 g or so. Will reevaluate patient in a.m. and determine whether we should proceed with EGD small bowel study. When patient is ready to be discharged will consider treating her with lansoprazole.

## 2011-01-01 NOTE — Progress Notes (Signed)
Dr. Karilyn Cota called to check on the pt.  Reported H/H 8.1/25.3 on last lab draw. Order given to transfuse pt with one unit of PRBCs and to premedicate with Tylenol and Benadryl.  Will text page the hospitalist to make them aware.

## 2011-01-01 NOTE — ED Notes (Signed)
Pt denies pain at this time. Small scratch noted to forehead. Pt states that she fell this am off the commode. Pt also has healing wound to her left lower leg with no drainage. Pt states that she vomited x 1 last night but is not sure if there is blood in it. CCM showing NSR.

## 2011-01-01 NOTE — ED Notes (Signed)
Pt c/o rectal bleeding and syncope since 0230. Pt found on commode by EMS diaphoretic was hypotensive with B/P 80/50. Pt was given 1200 ml bolus NS by EMS and placed in Trendelenberg. Pt alert and oriented at this time. Pt has small amt. Maroon colored stool at rectum. Pt is pale but warm and dry.

## 2011-01-01 NOTE — H&P (Addendum)
Hospital Admission Note Date: 01/01/2011  Patient name: [name] Medical record number: [id number] Date of birth: 1950/06/23 Age: 60 y.o. Gender: female PCP: Monica Becton, MD, MD  Attending physician: Christiane Ha  Chief Complaint: Rectal bleeding with fainting spell  History of Present Illness:  [name] is an 60 y.o. female with a history of previous GI bleed and chronic anticoagulation who was brought to the hospital after having had a syncopal episode and rectal bleeding. She noticed last night that she felt nauseated and vomited. The emesis was reportedly nonbloody or non-coffee ground. This morning, she woke up and had a melanotic stool. She felt dizzy with standing and had a syncopal episode while on the commode. She sustained a laceration to her left for head. When her husband heard her fall and came to help, she was noted to be lethargic and in and out of consciousness. When EMS arrived, her blood pressure was reportedly 80. Since getting IV fluids, her blood pressure is currently above 100. She still feels dizzy with sitting up. She has had no further bleeding. She had upper and lower endoscopy by Dr. Dionicia Abler a month ago. The EGD showed persistent erosive reflux esophagitis, erosive antral gastritis and high grade ring at the GE junction which was dilated and disrupted with a balloon. this was virtually unchanged from May 2011. Colonoscopy showed redundant colon and a few diverticula at the sigmoid. Patient remains on Coumadin for recurrent DVTs and factor V Leiden deficiency. Her INR today is 3.68. Hemoglobin is 9.1. BUN is 21. Dr. Dionicia Abler has been called by the ED physician and he will consult. He has asked the hospitalist to admit. The patient denies any abdominal pain. She has multiple drug allergies including multiple allergies to various proton pump inhibitors. Most recently, she's been on AcipHex.  She had a chest x-ray which showed infiltrates and was given  levofloxacin in the emergency room. However, the patient denies cough fevers chills or other symptoms of pneumonia.  Past Medical History  Diagnosis Date  . Vitamin B12 deficiency     vit b12 1000 mcg monthly  . Cellulitis of left leg 2006  . Ulcer 05/2009    esophageal  . Clotting disorder     heterozygosity from factor v leiden  . Pernicious anemia 07/10/2010  . DVT (deep venous thrombosis) 07/10/2010  . Factor V Leiden   . History of blood clots     lower legs  . Blood dyscrasia   . Anxiety     Meds: Medications Prior to Admission  Medication Dose Route Frequency Provider Last Rate Last Dose  . 0.9 %  sodium chloride infusion   Intravenous Once Donnetta Hutching, MD      . 0.9 %  sodium chloride infusion   Intravenous Once Donnetta Hutching, MD 250 mL/hr at 01/01/11 [phone]    . famotidine (PEPCID) IVPB 20 mg  20 mg Intravenous Once Christiane Ha      . Levofloxacin (LEVAQUIN) IVPB 750 mg  750 mg Intravenous Once Donnetta Hutching, MD   750 mg at 01/01/11 0944  . ondansetron (ZOFRAN) injection 4 mg  4 mg Intravenous Once Donnetta Hutching, MD   4 mg at 01/01/11 0818  . phytonadione (VITAMIN K) 10 mg in dextrose 5 % 50 mL IVPB  10 mg Intravenous Once Christiane Ha      . DISCONTD: pantoprazole (PROTONIX) injection 40 mg  40 mg Intravenous Once Donnetta Hutching, MD      .  DISCONTD: vancomycin (VANCOCIN) IVPB 1000 mg/200 mL premix  1,000 mg Intravenous Once Donnetta Hutching, MD       Medications Prior to Admission  Medication Sig Dispense Refill  . acetaminophen (TYLENOL) 325 MG tablet Take 650 mg by mouth every 6 (six) hours as needed. Pain      . ALPRAZolam (XANAX) 0.5 MG tablet Take 0.5 mg by mouth daily.       . CYANOCOBALAMIN IJ Inject 1,000 mcg as directed every 30 (thirty) days.        . diphenhydrAMINE (BENADRYL) 25 mg capsule Take 25 mg by mouth every 6 (six) hours as needed. Itching or allergies      . folic acid (FOLVITE) 1 MG tablet Take 1 mg by mouth daily.        . iron polysaccharides (NIFEREX) 150  MG capsule Take 150 mg by mouth every morning.       . warfarin (COUMADIN) 2 MG tablet Take 2 mg by mouth daily. Takes along with the 5 mg to create 7 mg daily.      Marland Kitchen warfarin (COUMADIN) 5 MG tablet Take 5 mg by mouth daily. Takes along with the 2 mg to create 7 mg.       Allergies: Cephalexin; Dexlansoprazole; Latex; Other; Pantoprazole sodium; Penicillins; Lovenox; Nylon; Omeprazole; and Sulfonamide derivatives History   Social History  . Marital Status: Married    Spouse Name: N/A    Number of Children: N/A  . Years of Education: N/A   Occupational History  . Not on file.   Social History Main Topics  . Smoking status: Never Smoker   . Smokeless tobacco: Not on file  . Alcohol Use: No  . Drug Use: No  . Sexually Active: Not on file   Other Topics Concern  . Not on file   Social History Narrative  . No narrative on file   History reviewed. No pertinent family history. Past Surgical History  Procedure Date  . Abdominal hysterectomy 1989  . Balloon dilation 12/11/2010    Procedure: BALLOON DILATION;  Surgeon: Malissa Hippo, MD;  Location: [facility];  Service: Endoscopy;  Laterality: N/A;    Review of Systems: She has been sad over the recent death of her father, and she is frustrated with her chronic health problems requiring recurrent admissions to the hospital. Otherwise, Systems reviewed and as per HPI, otherwise negative.  Physical Exam: Blood pressure 109/47, pulse 60, temperature 97.6 F (36.4 C), temperature source Oral, resp. rate 15, height 5\' 8"  (1.727 m), weight 104.327 kg (230 lb), SpO2 100.00%. BP 109/47  Pulse 60  Temp(Src) 97.6 F (36.4 C) (Oral)  Resp 15  Ht 5\' 8"  (1.727 m)  Wt 104.327 kg (230 lb)  BMI 34.97 kg/m2  SpO2 100%  General Appearance:    Alert, cooperative, tearful   Head:    left fore head with a 1 cm shallow laceration   Eyes:    PERRL, conjunctiva pale, EOM's intact, fundi    benign, both eyes     Nose:   Nares normal,  septum midline, mucosa normal, no drainage    or sinus tenderness  Throat:   Lips, mucosa, and tongue normal; teeth and gums normal  Neck:   Supple, symmetrical, trachea midline, no adenopathy;    thyroid:  no enlargement/tenderness/nodules; no carotid   bruit or JVD  Back:     Symmetric, kyphotic, ROM normal, no CVA tenderness  Lungs:     Clear to auscultation  bilaterally, respirations unlabored  Chest Wall:    No tenderness or deformity   Heart:    Regular rate and rhythm, S1 and S2 normal, no murmur, rub   or gallop  Breast Exam:    deferred   Abdomen:     Soft, non-tender, bowel sounds active all four quadrants,    no masses, no organomegaly  Genitalia:    deferred   Rectal:    per ED physician showed murmur range heme positive stool   Extremities:   chronic hyperpigmentation and 2-3 cm ulcer in the left pretibial area which has no evidence of drainage or infection   Pulses:   2+ and symmetric all extremities  Skin:   Skin color, texture, turgor normal, no rashes or lesions  Lymph nodes:   Cervical, supraclavicular, and axillary nodes normal  Neurologic:   CNII-XII intact, normal strength, sensation and reflexes    Throughout     Psychiatric: Tearful cooperative  Lab results: Basic Metabolic Panel:  Basename 01/01/11 0823  NA 143  K 4.1  CL 113*  CO2 24  GLUCOSE 125*  BUN 21  CREATININE 0.68  CALCIUM 8.0*  MG --  PHOS --   Liver Function Tests: No results found for this basename: AST:2,ALT:2,ALKPHOS:2,BILITOT:2,PROT:2,ALBUMIN:2 in the last 72 hours No results found for this basename: LIPASE:2,AMYLASE:2 in the last 72 hours No results found for this basename: AMMONIA:2 in the last 72 hours CBC:  Basename 01/01/11 0823  WBC 7.2  NEUTROABS 4.8  HGB 9.1*  HCT 28.7*  MCV 87.0  PLT 239   EKG shows normal sinus rhythm with age-indeterminate anterior infarct and a PVC  Imaging results:  Dg Chest Portable 1 View  01/01/2011  *RADIOLOGY REPORT*  Clinical Data: Rectal  bleeding, shortness of breath  PORTABLE CHEST - 1 VIEW  Comparison: Chest x-ray of 08/24/2010  Findings: The lungs are not well aerated.  There is patchy airspace disease in the upper lobes right greater than right most consistent with pneumonia.  The patient is somewhat rotated and there is prominence of the right paratracheal soft tissues.  Attention to this area on follow-up chest x-ray is recommended.  There is mild cardiomegaly present.  No bony abnormality is seen.  IMPRESSION:  1.  Poor aeration with bilateral airspace disease in the upper lobes left greater than right most consistent with pneumonia. 2.  Rotated film.  Recommend attention to the right paratracheal region on follow-up chest x-ray.  Original Report Authenticated By: Juline Patch, M.D.    Assessment & Plan: Principal Problem:  *GI BLEEDING: Sounds like this may be an upper GI bleed again. She has multiple allergies to proton pump inhibitors including dexlansoprazole, pantoprazole, omeprazole. I will give her IV Pepcid, consult Dr. Dionicia Abler, admit her to the step down unit and get serial hemoglobins. Her INR is supratherapeutic today. She reports having had severe chest pain after getting FFP previously and therefore would like to avoid transfusion of FFP. Instead, I will give IV vitamin K to reverse her Coumadin in the setting of acute bleeding and hypotension. Transfuse as needed. This has been a recurrent problem with her and I will consult hematology. Consider Lovenox, or other anticoagulants eventually long-term.   Reflux esophagitis: May be the culprit of her continued bleeding. See above.  Acute blood loss anemia: See above  DVT (deep venous thrombosis): Sequential compression devices and TED hose for now.  Pernicious anemia  factor V Leiden deficiency  Syncope secondary to hypotension: See above  Chronic anticoagulation  Abnormal CXR: No evidence of pneumonia clinically. Hold off on any further antibiotics unless she develops  signs of pneumonia.  ANEMIA, IRON DEFICIENCY, CHRONIC: She gets IV iron periodically with Dr. Laurie Panda  Laceration of forehead: This is rather small and shallow. I've asked the nurse to place Steri-Strips.  Chronic ulcer of left leg   Jourdain Guay L 01/01/2011, 10:37 AM

## 2011-01-02 ENCOUNTER — Inpatient Hospital Stay (HOSPITAL_COMMUNITY): Payer: Medicare Other

## 2011-01-02 ENCOUNTER — Encounter (HOSPITAL_COMMUNITY)
Admission: EM | Disposition: A | Discharge status: Home / Self Care | Payer: Self-pay | Source: Home / Self Care | Attending: General Surgery

## 2011-01-02 DIAGNOSIS — D481 Neoplasm of uncertain behavior of connective and other soft tissue: Secondary | ICD-10-CM

## 2011-01-02 HISTORY — PX: GIVENS CAPSULE STUDY: SHX5432

## 2011-01-02 LAB — TYPE AND SCREEN
ABO/RH(D): A POS
Antibody Screen: NEGATIVE
Unit division: 0

## 2011-01-02 LAB — CARDIAC PANEL(CRET KIN+CKTOT+MB+TROPI)
CK, MB: 2 ng/mL (ref 0.3–4.0)
Relative Index: INVALID (ref 0.0–2.5)
Total CK: 65 U/L (ref 7–177)
Troponin I: <0.3 ng/mL

## 2011-01-02 LAB — BASIC METABOLIC PANEL WITH GFR
BUN: 12 mg/dL (ref 6–23)
CO2: 24 meq/L (ref 19–32)
Calcium: 8.3 mg/dL — ABNORMAL LOW (ref 8.4–10.5)
Chloride: 111 meq/L (ref 96–112)
Creatinine, Ser: 0.67 mg/dL (ref 0.50–1.10)
GFR calc Af Amer: >90 mL/min
GFR calc non Af Amer: >90 mL/min
Glucose, Bld: 100 mg/dL — ABNORMAL HIGH (ref 70–99)
Potassium: 3.9 meq/L (ref 3.5–5.1)
Sodium: 139 meq/L (ref 135–145)

## 2011-01-02 LAB — HEMOGLOBIN AND HEMATOCRIT, BLOOD
HCT: 27.4 % — ABNORMAL LOW (ref 36.0–46.0)
HCT: 27.3 % — ABNORMAL LOW (ref 36.0–46.0)
Hemoglobin: 8.7 g/dL — ABNORMAL LOW (ref 12.0–15.0)
Hemoglobin: 8.6 g/dL — ABNORMAL LOW (ref 12.0–15.0)

## 2011-01-02 SURGERY — IMAGING PROCEDURE, GI TRACT, INTRALUMINAL, VIA CAPSULE

## 2011-01-02 MED ORDER — IOHEXOL 300 MG/ML  SOLN
100.0000 mL | Freq: Once | INTRAMUSCULAR | Status: AC | PRN
  Administered 2011-01-02: 100 mL via INTRAVENOUS

## 2011-01-02 MED ORDER — FAMOTIDINE 20 MG PO TABS
20.0000 mg | ORAL_TABLET | Freq: Two times a day (BID) | ORAL | Status: DC
  Administered 2011-01-02 – 2011-01-11 (×17): 20 mg via ORAL
  Filled 2011-01-02 (×17): qty 1

## 2011-01-02 MED ORDER — FAMOTIDINE 20 MG PO TABS: 20.0000 mg | ORAL_TABLET | Freq: Two times a day (BID) | ORAL | Status: DC

## 2011-01-02 NOTE — Progress Notes (Signed)
Patient has not experience any more episodes of rectal bleeding. She denies abdominal pain nausea or vomiting. Her hemoglobin last night was 8.1 g and she was given a unit of PRBCs. Her hemoglobin this morning was 8.6 g. Small bowel given capsule study reveals ulcerated mass in mid small bowel. No active bleeding noted there is a coffee-ground distal to it as well as lots in the colon. I have reviewed findings with the patient and her daughter-in-law Ms. Amy Gahan. I also went over the images with Dr. Lendell Caprice. Recommendations Abdomino- pelvic CT with contrast in a.m. Surgical consultation with Dr. Lovell Sheehan. Dr. Darrick Penna assisting with GI issues over the weekend.

## 2011-01-02 NOTE — Progress Notes (Signed)
Stage II Noted on anterior right lower leg surrounded by redness with odor noted. Dr. Lendell Caprice aware.  Wound consult ordered.

## 2011-01-02 NOTE — Progress Notes (Signed)
Initial visit offering emotional and spiritual support. Patient was resting with her husband present.  Discussed her support system.  She is also experiencing grief from the recent death of her father and a sister-in-law.  She also shared they had a son who died within the past three years.  Brought her grief support materials and will continue to offer support. Prayer also.

## 2011-01-02 NOTE — Op Note (Signed)
[name], [name]               ACCOUNT NO.:  [id number]  MEDICAL RECORD NO.:  [id number]  LOCATION:  A328                          FACILITY:  [facility]  PHYSICIAN:  Lionel December, M.D.    DATE OF BIRTH:  1950/10/15  DATE OF PROCEDURE:  01/02/2011 DATE OF DISCHARGE:                              OPERATIVE REPORT   PROCEDURE:  Small-bowel given capsule study.  INDICATION:  [name] is a 60 year old Caucasian female with history of factor 5 deficiency who was chronically anticoagulated who has had problems with GI bleed.  She was recently evaluated by me with EGD and a colonoscopy 3 weeks ago.  She was noted to have ulcerative esophagitis and a moderate-sized hiatal hernia.  I felt that she probably has been losing blood from her esophagus in the setting of chronic anticoagulation and the fact that she is intolerant or allergic to PPIs. She now presents with acute GI bleed and drop in her hemoglobin of 3.5 g.  She is, therefore, undergoing small-bowel given capsule study.  PROCEDURE:  Risks were reviewed with the patient and informed consent was obtained.  FINDINGS:  The patient was able to swallow given capsule without any difficulty.  Only 1 esophageal image is available for review.  No abnormality noted.  Capsule reaches bowel in 1 hour, 4 minutes and 51 seconds in the ileocecal region in 4 hours and 37 minutes.  In mid small bowel seen on multiple images, this large mass involving half of the circumference with bluish discoloration and central ulceration.  There is streaks of blood at the distal end, but no active bleeding noted.  There is coffee-ground material distal to this mass and few clots mixed with stool in the colon.  FINAL DIAGNOSIS:  Limited view of esophagus and no abnormality noted.  There is a large ulcerated mass in mid small bowel initially seen on image at 2 hours, 46 minutes and 29 seconds and even better seen on sequential images.  No active bleeding noted,  but there was a coffee- ground material distal to it, implying this is the source of patient's GI blood loss.  RECOMMENDATIONS:  Abdominopelvic CT with contrast.  Surgical consultation with Dr. Lovell Sheehan.  I have gone over study findings with Dr. Lendell Caprice.  Dr. Darrick Penna will be kindly covering for me over the weekend.          ______________________________ Lionel December, M.D.     NR/MEDQ  D:  01/02/2011  T:  01/02/2011  Job:  [id number]

## 2011-01-02 NOTE — Progress Notes (Signed)
UR Chart Review Completed  

## 2011-01-02 NOTE — Op Note (Signed)
Please see dictated note; Ulcerated mass at mid small bowel with stigmata of bleed.

## 2011-01-02 NOTE — Progress Notes (Signed)
Discussed with Dr. Karilyn Cota yesterday. Patient currently getting capsule endoscopy.  Subjective: Has had no further stools. No vomiting. He felt a little nauseated earlier. No dizziness.  Objective: Vital signs in last 24 hours: Filed Vitals:   01/02/11 0802 01/02/11 0900 01/02/11 1100 01/02/11 1200  BP:  118/53 116/62 117/59  Pulse:  57 66 63  Temp:    97.8 F (36.6 C)  TempSrc:    Oral  Resp:  13 19 17   Height: 5\' 7"  (1.702 m)     Weight: 104.327 kg (230 lb)     SpO2:  100% 97% 100%   Weight change:   Intake/Output Summary (Last 24 hours) at 01/02/11 1241 Last data filed at 01/02/11 0800  Gross per 24 hour  Intake 4303.75 ml  Output   2000 ml  Net 2303.75 ml   Physical Exam: Exam unchanged from 01/01/2011  Lab Results: Basic Metabolic Panel:  Lab 01/02/11 5409 01/01/11 0823  NA 139 143  K 3.9 4.1  CL 111 113*  CO2 24 24  GLUCOSE 100* 125*  BUN 12 21  CREATININE 0.67 0.68  CALCIUM 8.3* 8.0*  MG -- --  PHOS -- --   Liver Function Tests: No results found for this basename: AST:2,ALT:2,ALKPHOS:2,BILITOT:2,PROT:2,ALBUMIN:2 in the last 168 hours No results found for this basename: LIPASE:2,AMYLASE:2 in the last 168 hours No results found for this basename: AMMONIA:2 in the last 168 hours CBC:  Lab 01/02/11 0930 01/02/11 0258 01/01/11 0823  WBC -- -- 7.2  NEUTROABS -- -- 4.8  HGB 8.6* 8.7* --  HCT 27.3* 27.4* --  MCV -- -- 87.0  PLT -- -- 239   Cardiac Enzymes:  Lab 01/02/11 0510  CKTOTAL 65  CKMB 2.0  CKMBINDEX --  TROPONINI <0.30   BNP: No results found for this basename: POCBNP:3 in the last 168 hours D-Dimer: No results found for this basename: DDIMER:2 in the last 168 hours CBG: No results found for this basename: GLUCAP:6 in the last 168 hours Hemoglobin A1C: No results found for this basename: HGBA1C in the last 168 hours Fasting Lipid Panel: No results found for this basename: CHOL,HDL,LDLCALC,TRIG,CHOLHDL,LDLDIRECT in the last 811  hours Thyroid Function Tests: No results found for this basename: TSH,T4TOTAL,FREET4,T3FREE,THYROIDAB in the last 168 hours Coagulation:  Lab 01/02/11 0258 01/01/11 0823  LABPROT 18.3* 37.1*  INR 1.49 3.68*    Micro Results: Recent Results (from the past 240 hour(s))  CULTURE, BLOOD (ROUTINE X 2)     Status: Normal (Preliminary result)   Collection Time   01/01/11  9:10 AM      Component Value Range Status Comment   Specimen Description Blood RIGHT HAND   Final    Special Requests BOTTLES DRAWN AEROBIC ONLY 6 CC   Final    Culture NO GROWTH <24 HRS   Final    Report Status PENDING   Incomplete   CULTURE, BLOOD (ROUTINE X 2)     Status: Normal (Preliminary result)   Collection Time   01/01/11  9:17 AM      Component Value Range Status Comment   Specimen Description Blood RIGHT ARM   Final    Special Requests BOTTLES DRAWN AEROBIC AND ANAEROBIC 10 CC EACH   Final    Culture NO GROWTH <24 HRS   Final    Report Status PENDING   Incomplete   MRSA PCR SCREENING     Status: Normal   Collection Time   01/01/11  1:17 PM  Component Value Range Status Comment   MRSA by PCR NEGATIVE  NEGATIVE  Final    Studies/Results: Dg Chest Portable 1 View  01/01/2011  *RADIOLOGY REPORT*  Clinical Data: Rectal bleeding, shortness of breath  PORTABLE CHEST - 1 VIEW  Comparison: Chest x-ray of 08/24/2010  Findings: The lungs are not well aerated.  There is patchy airspace disease in the upper lobes right greater than right most consistent with pneumonia.  The patient is somewhat rotated and there is prominence of the right paratracheal soft tissues.  Attention to this area on follow-up chest x-ray is recommended.  There is mild cardiomegaly present.  No bony abnormality is seen.  IMPRESSION:  1.  Poor aeration with bilateral airspace disease in the upper lobes left greater than right most consistent with pneumonia. 2.  Rotated film.  Recommend attention to the right paratracheal region on follow-up chest  x-ray.  Original Report Authenticated By: Juline Patch, M.D.   Scheduled Meds:   . acetaminophen  650 mg Oral Once  . ALPRAZolam  0.5 mg Oral QHS  . diphenhydrAMINE  25 mg Oral Once  . famotidine (PEPCID) IV  20 mg Intravenous Q12H  . famotidine (PEPCID) IV  20 mg Intravenous Once  . phytonadione (VITAMIN K) IV  10 mg Intravenous Once   Continuous Infusions:   . sodium chloride 200 mL/hr at 01/02/11 0700   PRN Meds:.acetaminophen, HYDROmorphone, ondansetron (ZOFRAN) IV, ondansetron, traZODone  Assessment/Plan: Principal Problem:  *GI BLEEDING Active Problems:  Reflux esophagitis  Acute blood loss anemia  DVT (deep venous thrombosis)  Pernicious anemia  Factor V Leiden  Syncope  Chronic anticoagulation  Abnormal CXR  ANEMIA, IRON DEFICIENCY, CHRONIC  Laceration of forehead  Chronic ulcer of left leg  Patient's blood pressure has stabilized. She's had no further bleeding. Her hemoglobin remained stable. She can transferred to the floor. Her Coumadin has been reversed with IV vitamin K and her INR is down. Patient has had bleeding 3 times on Coumadin. Her INR was high on admission. She reports an allergic reaction to Lovenox with rash and swelling. I spoke with Jenita Seashore, PA for Dr. Laurie Panda. Consider switching to Arixtra eventually once safe from a GI standpoint.  LOS: 1 day   Cason Dabney L 01/02/2011, 12:41 PM

## 2011-01-02 NOTE — Progress Notes (Signed)
D- At approximately 0540 Pt complained of chest pain 8/10 that was reproducible with external pressure to mid sternal and epigastric area.  Vitals signs were stable. Pt denies diaphoresis or radiation of pain.    A- Dr. Onalee Hua notified via phone of the above findings.  New orders were given for cardiac enzymes x1 and a 12-lead EKG and orders were carried out.   Dilaudid 0.5mg  given per orders for pain.    R-12-Lead showed NSR with sinus arrhythmia, Normal ECG and Cardiac Enzymes are WDL per lab reference range.  Pt reports pain is improved after dilaudid and denies complaints at this time.  Nursing staff to continue to monitor.

## 2011-01-03 ENCOUNTER — Encounter (HOSPITAL_COMMUNITY): Payer: Self-pay | Admitting: Internal Medicine

## 2011-01-03 DIAGNOSIS — C49A0 Gastrointestinal stromal tumor, unspecified site: Secondary | ICD-10-CM

## 2011-01-03 DIAGNOSIS — R220 Localized swelling, mass and lump, head: Secondary | ICD-10-CM | POA: Diagnosis not present

## 2011-01-03 DIAGNOSIS — K922 Gastrointestinal hemorrhage, unspecified: Secondary | ICD-10-CM

## 2011-01-03 DIAGNOSIS — K6389 Other specified diseases of intestine: Secondary | ICD-10-CM

## 2011-01-03 HISTORY — DX: Other specified diseases of intestine: K63.89

## 2011-01-03 HISTORY — DX: Gastrointestinal stromal tumor, unspecified site: C49.A0

## 2011-01-03 LAB — CBC
MCH: 27.5 pg (ref 26.0–34.0)
MCHC: 32 g/dL (ref 30.0–36.0)
Platelets: 229 10*3/uL (ref 150–400)
RBC: 3.31 MIL/uL — ABNORMAL LOW (ref 3.87–5.11)

## 2011-01-03 LAB — BASIC METABOLIC PANEL
CO2: 26 mEq/L (ref 19–32)
Calcium: 8.8 mg/dL (ref 8.4–10.5)
GFR calc non Af Amer: >90 mL/min (ref >90)
Potassium: 3.4 mEq/L — ABNORMAL LOW (ref 3.5–5.1)
Sodium: 141 mEq/L (ref 135–145)

## 2011-01-03 MED ORDER — POTASSIUM CHLORIDE CRYS ER 20 MEQ PO TBCR
20.0000 meq | EXTENDED_RELEASE_TABLET | Freq: Every day | ORAL | Status: AC
  Administered 2011-01-03 – 2011-01-04 (×2): 20 meq via ORAL
  Filled 2011-01-03 (×2): qty 1

## 2011-01-03 MED ORDER — KCL IN DEXTROSE-NACL 40-5-0.45 MEQ/L-%-% IV SOLN
INTRAVENOUS | Status: DC
  Administered 2011-01-03: 950 mL via INTRAVENOUS
  Administered 2011-01-04: 1000 mL via INTRAVENOUS
  Administered 2011-01-05 – 2011-01-06 (×2): via INTRAVENOUS

## 2011-01-03 NOTE — Progress Notes (Signed)
Subjective: The patient says that her top lip began to swell after she drank a cup of coffee and ate vanilla pudding. The swelling has since resolved. She has no difficulty breathing and no throat swelling. She denies abdominal pain. She has had no further rectal bleeding over the past 24-48 hours.  Objective: Vital signs in last 24 hours: Filed Vitals:   01/02/11 1845 01/02/11 2207 01/03/11 0208 01/03/11 0621  BP: 177/81 114/62 105/65 125/77  Pulse: 93 70 76 75  Temp: 98.1 F (36.7 C) 98.2 F (36.8 C) 98.2 F (36.8 C) 97.9 F (36.6 C)  TempSrc: Oral Oral Oral Oral  Resp: 20 20 16 16   Height:      Weight:      SpO2: 99% 91% 94% 94%    Intake/Output Summary (Last 24 hours) at 01/03/11 1236 Last data filed at 01/02/11 1900  Gross per 24 hour  Intake    320 ml  Output      0 ml  Net    320 ml    Weight change: 0 kg (0 lb)  Exam: Oropharynx: No oral pharyngeal or lip edema or erythema. Lungs: Occasional crackles in the bases, breathing nonlabored. Heart: S1, S2, with a soft systolic murmur. Abdomen: Obese, positive bowel sounds, nontender, nondistended. Extremities: No pedal edema.  Lab Results: Basic Metabolic Panel:  Basename 01/03/11 0449 01/02/11 0258  NA 141 139  K 3.4* 3.9  CL 109 111  CO2 26 24  GLUCOSE 98 100*  BUN 8 12  CREATININE 0.63 0.67  CALCIUM 8.8 8.3*  MG -- --  PHOS -- --   Liver Function Tests: No results found for this basename: AST:2,ALT:2,ALKPHOS:2,BILITOT:2,PROT:2,ALBUMIN:2 in the last 72 hours No results found for this basename: LIPASE:2,AMYLASE:2 in the last 72 hours No results found for this basename: AMMONIA:2 in the last 72 hours CBC:  Basename 01/03/11 0449 01/02/11 0930 01/01/11 0823  WBC 5.5 -- 7.2  NEUTROABS -- -- 4.8  HGB 9.1* 8.6* --  HCT 28.4* 27.3* --  MCV 85.8 -- 87.0  PLT 229 -- 239   Cardiac Enzymes:  Basename 01/02/11 0510  CKTOTAL 65  CKMB 2.0  CKMBINDEX --  TROPONINI <0.30   BNP: No results found for this  basename: POCBNP:3 in the last 72 hours D-Dimer: No results found for this basename: DDIMER:2 in the last 72 hours CBG: No results found for this basename: GLUCAP:6 in the last 72 hours Hemoglobin A1C: No results found for this basename: HGBA1C in the last 72 hours Fasting Lipid Panel: No results found for this basename: CHOL,HDL,LDLCALC,TRIG,CHOLHDL,LDLDIRECT in the last 72 hours Thyroid Function Tests: No results found for this basename: TSH,T4TOTAL,FREET4,T3FREE,THYROIDAB in the last 72 hours Anemia Panel: No results found for this basename: VITAMINB12,FOLATE,FERRITIN,TIBC,IRON,RETICCTPCT in the last 72 hours Coagulation:  Basename 01/02/11 0258 01/01/11 0823  LABPROT 18.3* 37.1*  INR 1.49 3.68*   Urine Drug Screen: Drugs of Abuse  No results found for this basename: labopia, cocainscrnur, labbenz, amphetmu, thcu, labbarb    Alcohol Level: No results found for this basename: ETH:2 in the last 72 hours   Micro: Recent Results (from the past 240 hour(s))  CULTURE, BLOOD (ROUTINE X 2)     Status: Normal (Preliminary result)   Collection Time   01/01/11  9:10 AM      Component Value Range Status Comment   Specimen Description BLOOD RIGHT HAND   Final    Special Requests BOTTLES DRAWN AEROBIC ONLY 6 CC   Final  Culture NO GROWTH 2 DAYS   Final    Report Status PENDING   Incomplete   CULTURE, BLOOD (ROUTINE X 2)     Status: Normal (Preliminary result)   Collection Time   01/01/11  9:17 AM      Component Value Range Status Comment   Specimen Description BLOOD RIGHT ARM   Final    Special Requests BOTTLES DRAWN AEROBIC AND ANAEROBIC 10 CC EACH   Final    Culture NO GROWTH 2 DAYS   Final    Report Status PENDING   Incomplete   MRSA PCR SCREENING     Status: Normal   Collection Time   01/01/11  1:17 PM      Component Value Range Status Comment   MRSA by PCR NEGATIVE  NEGATIVE  Final     Studies/Results: Ct Abdomen Pelvis W Contrast  01/03/2011  *RADIOLOGY REPORT*   Clinical Data: Gastrointestinal bleeding  CT ABDOMEN AND PELVIS WITH CONTRAST  Technique:  Multidetector CT imaging of the abdomen and pelvis was performed following the standard protocol during bolus administration of intravenous contrast.  Contrast: OMNIPAQUE IOHEXOL 300 MG/ML IV SOLN  Comparison: 10/29/2004  Findings: Bibasilar atelectasis or scar.  Increased AP diameter of the chest, a feature of COPD.  Hiatal hernia.  Oral contrast in the esophagus.  The liver, gallbladder, spleen, pancreas, right adrenal gland within normal limits.  Stable kidneys.  Stable left adrenal adenoma.  5.0 x 4.0 x 4.2 cm soft tissue mass is associated with small bowel in the lower abdomen.  Sagittal images demonstrate central induration.  This finding is typically seen with gastrointestinal stromal tumor.  Malignant transformation is not excluded.  No free fluid.  No abnormal adenopathy.  Bladder is within normal limits.  Uterus is absent.  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 Variceal collaterals extend across the inguinal regions.  Severe L5-S1 degenerative disc disease.  IMPRESSION: 5.0 cm soft tissue mass associated with distal small bowel. Findings characteristic of gastrointestinal stromal tumor. Adenocarcinoma or malignant transformation are not excluded.  Stable left adrenal adenoma.  Original Report Authenticated By: Donavan Burnet, M.D.    Medications: I have reviewed the patient's current medications.  Assessment: Principal Problem:  *GI BLEEDING Active Problems:  ANEMIA, IRON DEFICIENCY, CHRONIC  Reflux esophagitis  DVT (deep venous thrombosis)  Pernicious anemia  Factor V Leiden  Syncope  Acute blood loss anemia  Laceration of forehead  Chronic ulcer of left leg  Chronic anticoagulation  Abnormal CXR  Lip swelling  1. Ulcerated small bowel mass seen via capsule endoscopy and on the recent CT scan of the abdomen and pelvis. General surgeon Dr. Lovell Sheehan was consulted and his note  and assessment acknowledged and appreciated. Plan operation for Monday.  Factor V deficiency and history of DVT. Dr. Lovell Sheehan noted that a heparin drip will be started. The patient apparently has an allergy to Lovenox. Arixtra should be considered. Anti-coagulation should be restarted per the recommendations of GI and general surgery.  Acute blood loss anemia. Status post transfusions. Her hemoglobin today is 9.1.  Mild hypokalemia. She is receiving potassium in the IV fluids. Will order by mouth potassium as well.  Chest x-ray on admission was suggestive of pneumonia. Clinically, the patient does not appear to have pneumonia.   Transient lip edema. It may have been secondary to something that she ate in the pudding. She was advised to avoid eating vanilla pudding.  Plan:  Continue current management. Consider starting Arixtra  as anticoagulation but will discuss with GI and general surgery.  Replete potassium chloride orally.   LOS: 2 days   [name] 01/03/2011, 12:36 PM

## 2011-01-03 NOTE — Consult Note (Signed)
Reason for Consult:**Small bowel neoplasm* Referring Physician: Hospitalist, Dr. Terrill Mohr is an 60 y.o. Macdonald.  HPI: Patient is a [name] who presents with chronic anemia and recurrent gastrointestinal bleeding. Workup by Dr. Karilyn Cota of gastroenterology reveals a distal small bowel neoplasm. Was confirmed by CT scan of the abdomen as well as a small bowel capsule study. 2 significant for factor V deficiency.  Past Medical History  Diagnosis Date  . Vitamin B12 deficiency     vit b12 1000 mcg monthly  . Cellulitis of left leg 2006  . Ulcer 05/2009    esophageal  . Clotting disorder     heterozygosity from factor v leiden  . Pernicious anemia 07/10/2010  . DVT (deep venous thrombosis) 07/10/2010  . Factor V Leiden   . History of blood clots     lower legs  . Blood dyscrasia   . Anxiety     Past Surgical History  Procedure Date  . Abdominal hysterectomy 1989  . Balloon dilation 12/11/2010    Procedure: BALLOON DILATION;  Surgeon: Malissa Hippo, MD;  Location: [facility];  Service: Endoscopy;  Laterality: N/A;    History reviewed. No pertinent family history.  Social History:  reports that she has never smoked. She does not have any smokeless tobacco history on file. She reports that she does not drink alcohol or use illicit drugs.  Allergies:  Allergies  Allergen Reactions  . Cephalexin Swelling  . Dexlansoprazole Swelling  . Latex Itching  . Other Swelling    Patient states that Pecans cause her mouth to swell.  . Pantoprazole Sodium Swelling  . Penicillins Swelling  . Tape Itching  . Lovenox Itching, Swelling and Palpitations  . Nylon Rash  . Omeprazole Itching and Rash  . Sulfonamide Derivatives Rash    Medications: I have reviewed the patient's current medications.  Results for orders placed during the hospital encounter of 01/01/11 (from the past 48 hour(s))  HEMOGLOBIN AND HEMATOCRIT, BLOOD     Status: Abnormal   Collection  Time   01/01/11  1:04 PM      Component Value Range Comment   Hemoglobin 8.6 (*) 12.0 - 15.0 (g/dL)    HCT 16.1 (*) 09.6 - 46.0 (%)   MRSA PCR SCREENING     Status: Normal   Collection Time   01/01/11  1:17 PM      Component Value Range Comment   MRSA by PCR NEGATIVE  NEGATIVE    HEMOGLOBIN AND HEMATOCRIT, BLOOD     Status: Abnormal   Collection Time   01/01/11  6:36 PM      Component Value Range Comment   Hemoglobin 8.1 (*) 12.0 - 15.0 (g/dL)    HCT 04.5 (*) 40.9 - 46.0 (%)   PREPARE RBC (CROSSMATCH)     Status: Normal   Collection Time   01/01/11  9:24 PM      Component Value Range Comment   Order Confirmation ORDER PROCESSED BY BLOOD BANK     HEMOGLOBIN AND HEMATOCRIT, BLOOD     Status: Abnormal   Collection Time   01/02/11  2:58 AM      Component Value Range Comment   Hemoglobin 8.7 (*) 12.0 - 15.0 (g/dL)    HCT 81.1 (*) 91.4 - 46.0 (%)   PROTIME-INR     Status: Abnormal   Collection Time   01/02/11  2:58 AM      Component Value Range Comment   Prothrombin  Time 18.3 (*) 11.6 - 15.2 (seconds)    INR 1.Denise  0.00 - 1.Denise    BASIC METABOLIC PANEL     Status: Abnormal   Collection Time   01/02/11  2:58 AM      Component Value Range Comment   Sodium 139  135 - 145 (mEq/L)    Potassium 3.9  3.5 - 5.1 (mEq/L)    Chloride 111  96 - 112 (mEq/L)    CO2 24  19 - 32 (mEq/L)    Glucose, Bld 100 (*) 70 - 99 (mg/dL)    BUN 12  6 - 23 (mg/dL)    Creatinine, Ser 1.91  0.50 - 1.10 (mg/dL)    Calcium 8.3 (*) 8.4 - 10.5 (mg/dL)    GFR calc non Af Amer >90  >90 (mL/min)    GFR calc Af Amer >90  >90 (mL/min)   CARDIAC PANEL(CRET KIN+CKTOT+MB+TROPI)     Status: Normal   Collection Time   01/02/11  5:10 AM      Component Value Range Comment   Total CK 65  7 - 177 (U/L)    CK, MB 2.0  0.3 - 4.0 (ng/mL)    Troponin I <0.30  <0.30 (ng/mL)    Relative Index RELATIVE INDEX IS INVALID  0.0 - 2.5    HEMOGLOBIN AND HEMATOCRIT, BLOOD     Status: Abnormal   Collection Time   01/02/11  9:30 AM       Component Value Range Comment   Hemoglobin 8.6 (*) 12.0 - 15.0 (g/dL)    HCT 47.8 (*) 29.5 - 46.0 (%)   CBC     Status: Abnormal   Collection Time   01/03/11  4:Denise AM      Component Value Range Comment   WBC 5.5  4.0 - 10.5 (K/uL)    RBC 3.31 (*) 3.87 - 5.11 (MIL/uL)    Hemoglobin 9.1 (*) 12.0 - 15.0 (g/dL)    HCT 62.1 (*) 30.8 - 46.0 (%)    MCV 85.8  78.0 - 100.0 (fL)    MCH 27.5  26.0 - 34.0 (pg)    MCHC 32.0  30.0 - 36.0 (g/dL)    RDW 65.7  84.6 - 96.2 (%)    Platelets 229  150 - 400 (K/uL)   BASIC METABOLIC PANEL     Status: Abnormal   Collection Time   01/03/11  4:Denise AM      Component Value Range Comment   Sodium 141  135 - 145 (mEq/L)    Potassium 3.4 (*) 3.5 - 5.1 (mEq/L)    Chloride 109  96 - 112 (mEq/L)    CO2 26  19 - 32 (mEq/L)    Glucose, Bld 98  70 - 99 (mg/dL)    BUN 8  6 - 23 (mg/dL)    Creatinine, Ser 9.52  0.50 - 1.10 (mg/dL)    Calcium 8.8  8.4 - 10.5 (mg/dL)    GFR calc non Af Amer >90  >90 (mL/min)    GFR calc Af Amer >90  >90 (mL/min)     Ct Abdomen Pelvis W Contrast  01/03/2011  *RADIOLOGY REPORT*  Clinical Data: Gastrointestinal bleeding  CT ABDOMEN AND PELVIS WITH CONTRAST  Technique:  Multidetector CT imaging of the abdomen and pelvis was performed following the standard protocol during bolus administration of intravenous contrast.  Contrast: OMNIPAQUE IOHEXOL 300 MG/ML IV SOLN  Comparison: 10/29/2004  Findings: Bibasilar atelectasis or scar.  Increased AP diameter of the  chest, a feature of COPD.  Hiatal hernia.  Oral contrast in the esophagus.  The liver, gallbladder, spleen, pancreas, right adrenal gland within normal limits.  Stable kidneys.  Stable left adrenal adenoma.  5.0 x 4.0 x 4.2 cm soft tissue mass is associated with small bowel in the lower abdomen.  Sagittal images demonstrate central induration.  This finding is typically seen with gastrointestinal stromal tumor.  Malignant transformation is not excluded.  No free fluid.  No abnormal  adenopathy.  Bladder is within normal limits.  Uterus is absent.  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 Variceal collaterals extend across the inguinal regions.  Severe L5-S1 degenerative disc disease.  IMPRESSION: 5.0 cm soft tissue mass associated with distal small bowel. Findings characteristic of gastrointestinal stromal tumor. Adenocarcinoma or malignant transformation are not excluded.  Stable left adrenal adenoma.  Original Report Authenticated By: Donavan Burnet, M.D.    ROS: see chart  Blood pressure 125/77, pulse 75, temperature 97.9 F (36.6 C), temperature source Oral, resp. rate 16, height 5\' 7"  (1.702 m), weight 104.327 kg (230 lb), SpO2 94.00%. Physical Exam: Mildly obese white Macdonald in no acute distress. Lungs clear to auscultation with equal breath sounds bilaterally. Heart examination reveals a regular rate and rhythm Abdomen soft, nontender, nondistended. No hernias noted.  Assessment/Plan: Impression: Small bowel neoplasm, factor V deficiency Plan: Patient will need an exploratory laparotomy with partial small bowel resection. The risks and benefits of the surgery including bleeding, infection, cardiopulmonary difficulties, pulmonary embolus, and stroke were fully explained to the patient, gave informed consent. At this point, she is scheduled for Monday. As soon as she is able, I will start her on heparin drip. Will repeat her labs in the morning.  Kamar Callender A 01/03/2011, 10:10 AM

## 2011-01-04 ENCOUNTER — Encounter (HOSPITAL_COMMUNITY): Payer: Self-pay | Admitting: Internal Medicine

## 2011-01-04 DIAGNOSIS — L500 Allergic urticaria: Secondary | ICD-10-CM | POA: Diagnosis not present

## 2011-01-04 HISTORY — DX: Allergic urticaria: L50.0

## 2011-01-04 LAB — CBC
Hemoglobin: 9.7 g/dL — ABNORMAL LOW (ref 12.0–15.0)
MCH: 28 pg (ref 26.0–34.0)
MCHC: 32.7 g/dL (ref 30.0–36.0)
Platelets: 263 10*3/uL (ref 150–400)
RBC: 3.53 MIL/uL — ABNORMAL LOW (ref 3.87–5.11)
RDW: 15.3 % (ref 11.5–15.5)
WBC: 4.4 10*3/uL (ref 4.0–10.5)

## 2011-01-04 LAB — DIFFERENTIAL
Basophils Absolute: 0 10*3/uL (ref 0.0–0.1)
Basophils Relative: 0 % (ref 0–1)
Eosinophils Absolute: 0.2 10*3/uL (ref 0.0–0.7)
Monocytes Absolute: 0.9 10*3/uL (ref 0.1–1.0)
Monocytes Relative: 14 % — ABNORMAL HIGH (ref 3–12)
Neutro Abs: 3.3 10*3/uL (ref 1.7–7.7)
Neutrophils Relative %: 51 % (ref 43–77)

## 2011-01-04 LAB — BASIC METABOLIC PANEL
BUN: 5 mg/dL — ABNORMAL LOW (ref 6–23)
CO2: 25 mEq/L (ref 19–32)
Calcium: 9.2 mg/dL (ref 8.4–10.5)
Chloride: 108 mEq/L (ref 96–112)
Creatinine, Ser: 0.62 mg/dL (ref 0.50–1.10)
GFR calc Af Amer: >90 mL/min (ref >90)
GFR calc non Af Amer: >90 mL/min (ref >90)
Glucose, Bld: 129 mg/dL — ABNORMAL HIGH (ref 70–99)
Sodium: 140 mEq/L (ref 135–145)

## 2011-01-04 LAB — PHOSPHORUS: Phosphorus: 3.9 mg/dL (ref 2.3–4.6)

## 2011-01-04 LAB — MAGNESIUM: Magnesium: 2.1 mg/dL (ref 1.5–2.5)

## 2011-01-04 MED ORDER — HEPARIN SODIUM (PORCINE) 5000 UNIT/ML IJ SOLN
5000.0000 [IU] | Freq: Three times a day (TID) | INTRAMUSCULAR | Status: DC
  Administered 2011-01-04 – 2011-01-05 (×3): 5000 [IU] via SUBCUTANEOUS
  Filled 2011-01-04 (×3): qty 1

## 2011-01-04 MED ORDER — DIPHENHYDRAMINE HCL 25 MG PO CAPS
25.0000 mg | ORAL_CAPSULE | Freq: Once | ORAL | Status: AC
  Administered 2011-01-04: 25 mg via ORAL
  Filled 2011-01-04: qty 1

## 2011-01-04 MED ORDER — HYDROCORTISONE 1 % EX CREA
TOPICAL_CREAM | Freq: Two times a day (BID) | CUTANEOUS | Status: AC
  Administered 2011-01-04 – 2011-01-06 (×5): 1 via TOPICAL
  Filled 2011-01-04 (×6): qty 1.5

## 2011-01-04 MED ORDER — HEPARIN SODIUM (PORCINE) 5000 UNIT/ML IJ SOLN
5000.0000 [IU] | Freq: Once | INTRAMUSCULAR | Status: AC
  Administered 2011-01-05: 5000 [IU] via SUBCUTANEOUS
  Filled 2011-01-04: qty 1

## 2011-01-04 MED ORDER — SODIUM CHLORIDE 0.9 % IJ SOLN
INTRAMUSCULAR | Status: AC
  Administered 2011-01-04: 3 mL
  Filled 2011-01-04: qty 3

## 2011-01-04 MED ORDER — VANCOMYCIN HCL 1000 MG IV SOLR
1500.0000 mg | INTRAVENOUS | Status: AC
  Administered 2011-01-05: 1500 mg via INTRAVENOUS
  Filled 2011-01-04: qty 1500

## 2011-01-04 NOTE — Progress Notes (Signed)
2 Days Post-Op  Subjective: No new complaints.  Objective: Vital signs in last 24 hours: Temp:  [97.4 F (36.3 C)-98.4 F (36.9 C)] 97.8 F (36.6 C) (12/09 0600) Pulse Rate:  [65-81] 65  (12/09 0600) Resp:  [16-20] 16  (12/09 0600) BP: (115-147)/(69-83) 147/83 mmHg (12/09 0600) SpO2:  [95 %-98 %] 96 % (12/09 0600) Last BM Date: 12/31/10  Intake/Output from previous day: 12/08 0701 - 12/09 0700 In: 360 [P.O.:360] Out: 0  Intake/Output this shift:    General appearance: alert, cooperative and no distress GI: soft, non-tender; bowel sounds normal; no masses,  no organomegaly  Lab Results:   [facility] 01/04/11 0421 01/03/11 0449  WBC 4.4 5.5  HGB 9.7* 9.1*  HCT 30.3* 28.4*  PLT 263 229   BMET  Basename 01/04/11 0421 01/03/11 0449  NA 140 141  K 3.5 3.4*  CL 108 109  CO2 25 26  GLUCOSE 103* 98  BUN 6 8  CREATININE 0.61 0.63  CALCIUM 9.2 8.8   PT/INR  Basename 01/02/11 0258  LABPROT 18.3*  INR 1.49    Studies/Results: Ct Abdomen Pelvis W Contrast  01/03/2011  *RADIOLOGY REPORT*  Clinical Data: Gastrointestinal bleeding  CT ABDOMEN AND PELVIS WITH CONTRAST  Technique:  Multidetector CT imaging of the abdomen and pelvis was performed following the standard protocol during bolus administration of intravenous contrast.  Contrast: OMNIPAQUE IOHEXOL 300 MG/ML IV SOLN  Comparison: 10/29/2004  Findings: Bibasilar atelectasis or scar.  Increased AP diameter of the chest, a feature of COPD.  Hiatal hernia.  Oral contrast in the esophagus.  The liver, gallbladder, spleen, pancreas, right adrenal gland within normal limits.  Stable kidneys.  Stable left adrenal adenoma.  5.0 x 4.0 x 4.2 cm soft tissue mass is associated with small bowel in the lower abdomen.  Sagittal images demonstrate central induration.  This finding is typically seen with gastrointestinal stromal tumor.  Malignant transformation is not excluded.  No free fluid.  No abnormal adenopathy.  Bladder is within  normal limits.  Uterus is absent.  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 Variceal collaterals extend across the inguinal regions.  Severe L5-S1 degenerative disc disease.  IMPRESSION: 5.0 cm soft tissue mass associated with distal small bowel. Findings characteristic of gastrointestinal stromal tumor. Adenocarcinoma or malignant transformation are not excluded.  Stable left adrenal adenoma.  Original Report Authenticated By: Donavan Burnet, M.D.    Anti-infectives: Anti-infectives     Start     Dose/Rate Route Frequency Ordered Stop   01/01/11 0915   Levofloxacin (LEVAQUIN) IVPB 750 mg        750 mg 100 mL/hr over 90 Minutes Intravenous  Once 01/01/11 0909 01/01/11 1114   01/01/11 0915   vancomycin (VANCOCIN) IVPB 1000 mg/200 mL premix  Status:  Discontinued        1,000 mg 200 mL/hr over 60 Minutes Intravenous  Once 01/01/11 0909 01/01/11 1029          Assessment/Plan: s/p Procedure(s): GIVENS CAPSULE STUDY Impression: Small bowel neoplasm Plan: Scheduled for exploratory laparotomy, partial small bowel resection tomorrow. The risks and benefits of the procedure including bleeding, infection, pulmonary embolus, and stroke were fully explained to the patient, gave informed consent. Preoperative orders written.  LOS: 3 days    [name] A 01/04/2011

## 2011-01-04 NOTE — Progress Notes (Signed)
Subjective: Since I last evaluated the patient SHE HAS BEEN SEEN AND EVALUATED BY SURGERY. She has no questions or concerns.  Objective: Vital signs in last 24 hours: Temp:  [97.6 F (36.4 C)-98.4 F (36.9 C)] 97.6 F (36.4 C) (12/09 1000) Pulse Rate:  [65-72] 72  (12/09 1000) Resp:  [16-20] 20  (12/09 1000) BP: (115-147)/(69-83) 144/79 mmHg (12/09 1000) SpO2:  [95 %-97 %] 97 % (12/09 1000) Last BM Date: 12/31/10  Intake/Output from previous day: 12/08 0701 - 12/09 0700 In: 360 [P.O.:360] Out: 0  Intake/Output this shift:    General appearance: alert, cooperative and no distress GI: soft, non-tender; bowel sounds normal; no masses,  no organomegaly  Lab Results:  Basename 01/04/11 0421 01/03/11 0449 01/02/11 0930  WBC 4.4 5.5 --  HGB 9.7* 9.1* 8.6*  HCT 30.3* 28.4* 27.3*  PLT 263 229 --   BMET  Basename 01/04/11 0421 01/03/11 0449 01/02/11 0258  NA 140 141 139  K 3.5 3.4* 3.9  CL 108 109 111  CO2 25 26 24   GLUCOSE 103* 98 100*  BUN 6 8 12   CREATININE 0.61 0.63 0.67  CALCIUM 9.2 8.8 8.3*   LFT No results found for this basename: PROT,ALBUMIN,AST,ALT,ALKPHOS,BILITOT,BILIDIR,IBILI in the last 72 hours PT/INR  Basename 01/02/11 0258  LABPROT 18.3*  INR 1.49   Studies/Results: Ct Abdomen Pelvis W Contrast  01/03/2011  *RADIOLOGY REPORT*  Clinical Data: Gastrointestinal bleeding  CT ABDOMEN AND PELVIS WITH CONTRAST  Technique:  Multidetector CT imaging of the abdomen and pelvis was performed following the standard protocol during bolus administration of intravenous contrast.  Contrast: OMNIPAQUE IOHEXOL 300 MG/ML IV SOLN  Comparison: 10/29/2004  Findings: Bibasilar atelectasis or scar.  Increased AP diameter of the chest, a feature of COPD.  Hiatal hernia.  Oral contrast in the esophagus.  The liver, gallbladder, spleen, pancreas, right adrenal gland within normal limits.  Stable kidneys.  Stable left adrenal adenoma.  5.0 x 4.0 x 4.2 cm soft tissue mass is  associated with small bowel in the lower abdomen.  Sagittal images demonstrate central induration.  This finding is typically seen with gastrointestinal stromal tumor.  Malignant transformation is not excluded.  No free fluid.  No abnormal adenopathy.  Bladder is within normal limits.  Uterus is absent.  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 Variceal collaterals extend across the inguinal regions.  Severe L5-S1 degenerative disc disease.  IMPRESSION: 5.0 cm soft tissue mass associated with distal small bowel. Findings characteristic of gastrointestinal stromal tumor. Adenocarcinoma or malignant transformation are not excluded.  Stable left adrenal adenoma.  Original Report Authenticated By: Donavan Burnet, M.D.    Medications: I have reviewed the patient's current medications.  Assessment/Plan: LGIB 2O TO GIST  PLAN: SUPPORTIVE CARE DEFER TO DR. Lovell Sheehan FOR SUBSEQUENT MANAGEMENT   LOS: 3 days   [name] 01/04/2011, 4:19 PM

## 2011-01-04 NOTE — Progress Notes (Signed)
Subjective: The patient developed a rash on the volar surface of her right arm after tape was placed. Otherwise, she has no complaints. She is somewhat anxious about the upcoming operation tomorrow.  Objective: Vital signs in last 24 hours: Filed Vitals:   01/03/11 2139 01/04/11 0200 01/04/11 0600 01/04/11 1000  BP: 118/69 115/73 147/83 144/79  Pulse: 70 68 65 72  Temp: 98.4 F (36.9 C) 97.9 F (36.6 C) 97.8 F (36.6 C) 97.6 F (36.4 C)  TempSrc: Oral Oral Oral   Resp: 20 18 16 20   Height:      Weight:      SpO2: 96% 95% 96% 97%    Intake/Output Summary (Last 24 hours) at 01/04/11 1330 Last data filed at 01/04/11 0500  Gross per 24 hour  Intake    360 ml  Output      0 ml  Net    360 ml    Weight change:   Exam: Oropharynx: No oral pharyngeal or lip edema or erythema. Lungs: Occasional crackles in the bases, breathing nonlabored. Heart: S1, S2, with a soft systolic murmur. Abdomen: Obese, positive bowel sounds, nontender, nondistended. Extremities: No pedal edema. Skin: Mild urticarial type rash on the volar surface of her left forearm.  Lab Results: Basic Metabolic Panel:  Basename 01/04/11 0421 01/03/11 0449  NA 140 141  K 3.5 3.4*  CL 108 109  CO2 25 26  GLUCOSE 103* 98  BUN 6 8  CREATININE 0.61 0.63  CALCIUM 9.2 8.8  MG 2.1 --  PHOS 3.9 --   Liver Function Tests: No results found for this basename: AST:2,ALT:2,ALKPHOS:2,BILITOT:2,PROT:2,ALBUMIN:2 in the last 72 hours No results found for this basename: LIPASE:2,AMYLASE:2 in the last 72 hours No results found for this basename: AMMONIA:2 in the last 72 hours CBC:  Basename 01/04/11 0421 01/03/11 0449  WBC 4.4 5.5  NEUTROABS -- --  HGB 9.7* 9.1*  HCT 30.3* 28.4*  MCV 85.8 85.8  PLT 263 229   Cardiac Enzymes:  Basename 01/02/11 0510  CKTOTAL 65  CKMB 2.0  CKMBINDEX --  TROPONINI <0.30   BNP: No results found for this basename: POCBNP:3 in the last 72 hours D-Dimer: No results found for  this basename: DDIMER:2 in the last 72 hours CBG: No results found for this basename: GLUCAP:6 in the last 72 hours Hemoglobin A1C: No results found for this basename: HGBA1C in the last 72 hours Fasting Lipid Panel: No results found for this basename: CHOL,HDL,LDLCALC,TRIG,CHOLHDL,LDLDIRECT in the last 72 hours Thyroid Function Tests: No results found for this basename: TSH,T4TOTAL,FREET4,T3FREE,THYROIDAB in the last 72 hours Anemia Panel: No results found for this basename: VITAMINB12,FOLATE,FERRITIN,TIBC,IRON,RETICCTPCT in the last 72 hours Coagulation:  Basename 01/02/11 0258  LABPROT 18.3*  INR 1.49   Urine Drug Screen: Drugs of Abuse  No results found for this basename: labopia,  cocainscrnur,  labbenz,  amphetmu,  thcu,  labbarb    Alcohol Level: No results found for this basename: ETH:2 in the last 72 hours   Micro: Recent Results (from the past 240 hour(s))  CULTURE, BLOOD (ROUTINE X 2)     Status: Normal (Preliminary result)   Collection Time   01/01/11  9:10 AM      Component Value Range Status Comment   Specimen Description BLOOD RIGHT HAND   Final    Special Requests BOTTLES DRAWN AEROBIC ONLY 6 CC   Final    Culture NO GROWTH 2 DAYS   Final    Report Status PENDING   Incomplete  CULTURE, BLOOD (ROUTINE X 2)     Status: Normal (Preliminary result)   Collection Time   01/01/11  9:17 AM      Component Value Range Status Comment   Specimen Description BLOOD RIGHT ARM   Final    Special Requests BOTTLES DRAWN AEROBIC AND ANAEROBIC 10 CC EACH   Final    Culture NO GROWTH 2 DAYS   Final    Report Status PENDING   Incomplete   MRSA PCR SCREENING     Status: Normal   Collection Time   01/01/11  1:17 PM      Component Value Range Status Comment   MRSA by PCR NEGATIVE  NEGATIVE  Final     Studies/Results: Ct Abdomen Pelvis W Contrast  01/03/2011  *RADIOLOGY REPORT*  Clinical Data: Gastrointestinal bleeding  CT ABDOMEN AND PELVIS WITH CONTRAST  Technique:   Multidetector CT imaging of the abdomen and pelvis was performed following the standard protocol during bolus administration of intravenous contrast.  Contrast: OMNIPAQUE IOHEXOL 300 MG/ML IV SOLN  Comparison: 10/29/2004  Findings: Bibasilar atelectasis or scar.  Increased AP diameter of the chest, a feature of COPD.  Hiatal hernia.  Oral contrast in the esophagus.  The liver, gallbladder, spleen, pancreas, right adrenal gland within normal limits.  Stable kidneys.  Stable left adrenal adenoma.  5.0 x 4.0 x 4.2 cm soft tissue mass is associated with small bowel in the lower abdomen.  Sagittal images demonstrate central induration.  This finding is typically seen with gastrointestinal stromal tumor.  Malignant transformation is not excluded.  No free fluid.  No abnormal adenopathy.  Bladder is within normal limits.  Uterus is absent.  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 Variceal collaterals extend across the inguinal regions.  Severe L5-S1 degenerative disc disease.  IMPRESSION: 5.0 cm soft tissue mass associated with distal small bowel. Findings characteristic of gastrointestinal stromal tumor. Adenocarcinoma or malignant transformation are not excluded.  Stable left adrenal adenoma.  Original Report Authenticated By: Donavan Burnet, M.D.    Medications: I have reviewed the patient's current medications.  Assessment: Principal Problem:  *GI BLEEDING Active Problems:  ANEMIA, IRON DEFICIENCY, CHRONIC  Reflux esophagitis  DVT (deep venous thrombosis)  Pernicious anemia  Factor V Leiden  Syncope  Acute blood loss anemia  Laceration of forehead  Chronic ulcer of left leg  Chronic anticoagulation  Abnormal CXR  Lip swelling  Small bowel mass  Allergic urticaria  1. Ulcerated small bowel mass seen via capsule endoscopy and on the recent CT scan of the abdomen and pelvis.  Planned operation tomorrow by Dr. Lovell Sheehan.  Factor V deficiency and history of DVT. Dr. Lovell Sheehan noted  that a heparin drip will be started. After the surgery. In the meantime, subcutaneous prophylactic heparin will be started today. The patient apparently has an allergy to Lovenox.  Acute blood loss anemia. Status post transfusions. Her hemoglobin today is 9.1.  Multiple allergies. She has an allergic urticarial rash on the left forearm from previous tape. Will order hydrocortisone cream twice a day for 3 days. Her lip edema has resolved. She believes it may have been secondary to the canal in pudding yesterday.  Mild hypokalemia. She is receiving potassium in the IV fluids and orally.  Chest x-ray on admission was suggestive of pneumonia. Clinically, the patient does not appear to have pneumonia.     Plan:  Continue current management.   Replete potassium chloride orally.   LOS: 3 days  Norely Schlick 01/04/2011, 1:30 PM

## 2011-01-05 ENCOUNTER — Encounter (HOSPITAL_COMMUNITY): Payer: Self-pay | Admitting: Anesthesiology

## 2011-01-05 ENCOUNTER — Encounter (HOSPITAL_COMMUNITY)
Admission: EM | Disposition: A | Discharge status: Home / Self Care | Payer: Self-pay | Source: Home / Self Care | Attending: General Surgery

## 2011-01-05 ENCOUNTER — Encounter (HOSPITAL_COMMUNITY): Payer: Self-pay | Admitting: *Deleted

## 2011-01-05 ENCOUNTER — Other Ambulatory Visit: Payer: Self-pay | Admitting: General Surgery

## 2011-01-05 ENCOUNTER — Inpatient Hospital Stay (HOSPITAL_COMMUNITY): Payer: Medicare Other | Admitting: Anesthesiology

## 2011-01-05 HISTORY — PX: LAPAROTOMY: SHX154

## 2011-01-05 HISTORY — PX: BOWEL RESECTION: SHX1257

## 2011-01-05 LAB — BASIC METABOLIC PANEL
BUN: 5 mg/dL — ABNORMAL LOW (ref 6–23)
Calcium: 9.5 mg/dL (ref 8.4–10.5)
Creatinine, Ser: 0.62 mg/dL (ref 0.50–1.10)
GFR calc non Af Amer: >90 mL/min (ref >90)
Glucose, Bld: 105 mg/dL — ABNORMAL HIGH (ref 70–99)
Sodium: 140 mEq/L (ref 135–145)

## 2011-01-05 LAB — CBC
MCH: 28.3 pg (ref 26.0–34.0)
MCHC: 32.9 g/dL (ref 30.0–36.0)
MCV: 85.9 fL (ref 78.0–100.0)
Platelets: 297 10*3/uL (ref 150–400)
RBC: 3.61 MIL/uL — ABNORMAL LOW (ref 3.87–5.11)
RDW: 15.3 % (ref 11.5–15.5)

## 2011-01-05 LAB — PROTIME-INR: Prothrombin Time: 15.2 seconds (ref 11.6–15.2)

## 2011-01-05 SURGERY — LAPAROTOMY, EXPLORATORY
Anesthesia: General | Site: Abdomen | Wound class: Clean Contaminated

## 2011-01-05 MED ORDER — MIDAZOLAM HCL 5 MG/5ML IJ SOLN
INTRAMUSCULAR | Status: AC
  Filled 2011-01-05: qty 5

## 2011-01-05 MED ORDER — ROCURONIUM BROMIDE 100 MG/10ML IV SOLN
INTRAVENOUS | Status: DC | PRN
  Administered 2011-01-05: 5 mg via INTRAVENOUS
  Administered 2011-01-05: 10 mg via INTRAVENOUS
  Administered 2011-01-05: 35 mg via INTRAVENOUS
  Administered 2011-01-05: 10 mg via INTRAVENOUS

## 2011-01-05 MED ORDER — POVIDONE-IODINE 10 % EX OINT
TOPICAL_OINTMENT | CUTANEOUS | Status: AC
  Filled 2011-01-05: qty 2

## 2011-01-05 MED ORDER — HEPARIN SODIUM (PORCINE) 5000 UNIT/ML IJ SOLN
5000.0000 [IU] | Freq: Three times a day (TID) | INTRAMUSCULAR | Status: DC
  Administered 2011-01-05 – 2011-01-06 (×2): 5000 [IU] via SUBCUTANEOUS
  Filled 2011-01-05 (×2): qty 1

## 2011-01-05 MED ORDER — SODIUM CHLORIDE 0.9 % IV SOLN
INTRAVENOUS | Status: DC
  Administered 2011-01-05: 14:00:00 via INTRAVENOUS

## 2011-01-05 MED ORDER — MIDAZOLAM HCL 5 MG/5ML IJ SOLN
INTRAMUSCULAR | Status: DC | PRN
  Administered 2011-01-05: 2 mg via INTRAVENOUS

## 2011-01-05 MED ORDER — ACETAMINOPHEN 10 MG/ML IV SOLN
1000.0000 mg | Freq: Four times a day (QID) | INTRAVENOUS | Status: AC
  Administered 2011-01-05 – 2011-01-06 (×5): 1000 mg via INTRAVENOUS
  Filled 2011-01-05 (×4): qty 100

## 2011-01-05 MED ORDER — LIDOCAINE HCL (CARDIAC) 10 MG/ML IV SOLN
INTRAVENOUS | Status: DC | PRN
  Administered 2011-01-05: 10 mg via INTRAVENOUS

## 2011-01-05 MED ORDER — SODIUM CHLORIDE 0.9 % IR SOLN
Status: DC | PRN
  Administered 2011-01-05: 1000 mL

## 2011-01-05 MED ORDER — GLYCOPYRROLATE 0.2 MG/ML IJ SOLN
INTRAMUSCULAR | Status: AC
  Filled 2011-01-05: qty 1

## 2011-01-05 MED ORDER — PROPOFOL 10 MG/ML IV EMUL
INTRAVENOUS | Status: DC | PRN
  Administered 2011-01-05 (×2): 20 mg via INTRAVENOUS
  Administered 2011-01-05: 130 mg via INTRAVENOUS

## 2011-01-05 MED ORDER — HYDROMORPHONE HCL PF 1 MG/ML IJ SOLN
1.0000 mg | INTRAMUSCULAR | Status: DC | PRN
  Administered 2011-01-05 – 2011-01-07 (×8): 1 mg via INTRAVENOUS
  Filled 2011-01-05 (×10): qty 1

## 2011-01-05 MED ORDER — LABETALOL HCL 5 MG/ML IV SOLN
INTRAVENOUS | Status: AC
  Filled 2011-01-05: qty 4

## 2011-01-05 MED ORDER — FENTANYL CITRATE 0.05 MG/ML IJ SOLN
25.0000 ug | INTRAMUSCULAR | Status: DC | PRN
  Administered 2011-01-05 (×3): 50 ug via INTRAVENOUS

## 2011-01-05 MED ORDER — LABETALOL HCL 5 MG/ML IV SOLN
INTRAVENOUS | Status: DC | PRN
  Administered 2011-01-05: 5 mg via INTRAVENOUS

## 2011-01-05 MED ORDER — GLYCOPYRROLATE 0.2 MG/ML IJ SOLN
INTRAMUSCULAR | Status: DC | PRN
  Administered 2011-01-05: .6 mg via INTRAVENOUS

## 2011-01-05 MED ORDER — FENTANYL CITRATE 0.05 MG/ML IJ SOLN
INTRAMUSCULAR | Status: AC
  Administered 2011-01-05: 50 ug via INTRAVENOUS
  Filled 2011-01-05: qty 2

## 2011-01-05 MED ORDER — ROCURONIUM BROMIDE 50 MG/5ML IV SOLN
INTRAVENOUS | Status: AC
  Filled 2011-01-05: qty 1

## 2011-01-05 MED ORDER — POVIDONE-IODINE 10 % EX OINT
TOPICAL_OINTMENT | CUTANEOUS | Status: DC | PRN
  Administered 2011-01-05: 1 via TOPICAL

## 2011-01-05 MED ORDER — FENTANYL CITRATE 0.05 MG/ML IJ SOLN
INTRAMUSCULAR | Status: AC
  Filled 2011-01-05: qty 2

## 2011-01-05 MED ORDER — ONDANSETRON HCL 4 MG/2ML IJ SOLN: 4.0000 mg | Freq: Once | INTRAMUSCULAR | Status: DC | PRN

## 2011-01-05 MED ORDER — ONDANSETRON HCL 4 MG PO TABS: 4.0000 mg | ORAL_TABLET | Freq: Four times a day (QID) | ORAL | Status: DC | PRN

## 2011-01-05 MED ORDER — LIDOCAINE HCL (PF) 1 % IJ SOLN
INTRAMUSCULAR | Status: AC
  Filled 2011-01-05: qty 5

## 2011-01-05 MED ORDER — ACETAMINOPHEN 10 MG/ML IV SOLN
INTRAVENOUS | Status: AC
  Administered 2011-01-05: 1000 mg via INTRAVENOUS
  Filled 2011-01-05: qty 100

## 2011-01-05 MED ORDER — ONDANSETRON HCL 4 MG/2ML IJ SOLN
4.0000 mg | Freq: Four times a day (QID) | INTRAMUSCULAR | Status: DC | PRN
  Administered 2011-01-06 – 2011-01-08 (×3): 4 mg via INTRAVENOUS
  Filled 2011-01-05 (×3): qty 2

## 2011-01-05 MED ORDER — NEOSTIGMINE METHYLSULFATE 1 MG/ML IJ SOLN
INTRAMUSCULAR | Status: DC | PRN
  Administered 2011-01-05: 3 mg via INTRAVENOUS

## 2011-01-05 MED ORDER — FENTANYL CITRATE 0.05 MG/ML IJ SOLN
INTRAMUSCULAR | Status: AC
  Administered 2011-01-05: 50 ug via INTRAVENOUS
  Filled 2011-01-05: qty 5

## 2011-01-05 MED ORDER — LACTATED RINGERS IV SOLN
INTRAVENOUS | Status: DC
  Administered 2011-01-05: 14:00:00 via INTRAVENOUS

## 2011-01-05 MED ORDER — LACTATED RINGERS IV SOLN
INTRAVENOUS | Status: DC
  Administered 2011-01-05 – 2011-01-06 (×2): via INTRAVENOUS

## 2011-01-05 MED ORDER — MIDAZOLAM HCL 2 MG/2ML IJ SOLN
1.0000 mg | INTRAMUSCULAR | Status: AC | PRN
  Administered 2011-01-05 (×2): 2 mg via INTRAVENOUS
  Administered 2011-01-05: 1 mg via INTRAVENOUS

## 2011-01-05 MED ORDER — MIDAZOLAM HCL 2 MG/2ML IJ SOLN
INTRAMUSCULAR | Status: AC
  Filled 2011-01-05: qty 2

## 2011-01-05 MED ORDER — PROPOFOL 10 MG/ML IV EMUL
INTRAVENOUS | Status: AC
  Filled 2011-01-05: qty 20

## 2011-01-05 MED ORDER — FENTANYL CITRATE 0.05 MG/ML IJ SOLN
INTRAMUSCULAR | Status: DC | PRN
  Administered 2011-01-05 (×5): 50 ug via INTRAVENOUS

## 2011-01-05 SURGICAL SUPPLY — 69 items
APPLIER CLIP 11 MED OPEN (CLIP)
APPLIER CLIP 13 LRG OPEN (CLIP)
APR CLP LRG 13 20 CLIP (CLIP)
APR CLP MED 11 20 MLT OPN (CLIP)
BAG HAMPER (MISCELLANEOUS) ×3 IMPLANT
BARRIER SKIN 2 3/4 (OSTOMY) IMPLANT
BARRIER SKIN OD2.25 2 3/4 FLNG (OSTOMY) IMPLANT
BRR SKN FLT 2.75X2.25 2 PC (OSTOMY)
CLAMP POUCH DRAINAGE QUIET (OSTOMY) IMPLANT
CLIP APPLIE 11 MED OPEN (CLIP) IMPLANT
CLIP APPLIE 13 LRG OPEN (CLIP) IMPLANT
CLOTH BEACON ORANGE TIMEOUT ST (SAFETY) ×3 IMPLANT
COVER LIGHT HANDLE STERIS (MISCELLANEOUS) ×6 IMPLANT
DRAPE WARM FLUID 44X44 (DRAPE) ×3 IMPLANT
DURAPREP 26ML APPLICATOR (WOUND CARE) ×3 IMPLANT
ELECT BLADE 6 FLAT ULTRCLN (ELECTRODE) IMPLANT
ELECT REM PT RETURN 9FT ADLT (ELECTROSURGICAL) ×3
ELECTRODE REM PT RTRN 9FT ADLT (ELECTROSURGICAL) ×2 IMPLANT
GAUZE SPONGE 4X4 12PLY STRL LF (GAUZE/BANDAGES/DRESSINGS) ×1 IMPLANT
GLOVE BIO SURGEON STRL SZ7.5 (GLOVE) ×4 IMPLANT
GLOVE BIOGEL PI IND STRL 7.0 (GLOVE) IMPLANT
GLOVE BIOGEL PI INDICATOR 7.0 (GLOVE) ×1
GLOVE SKINSENSE NS SZ6.5 (GLOVE) ×2
GLOVE SKINSENSE NS SZ7.5 (GLOVE) ×1
GLOVE SKINSENSE STRL SZ6.5 (GLOVE) IMPLANT
GLOVE SKINSENSE STRL SZ7.5 (GLOVE) IMPLANT
GOWN STRL REIN XL XLG (GOWN DISPOSABLE) ×9 IMPLANT
HARMONIC SHEARS 14CM COAG (MISCELLANEOUS) IMPLANT
INST SET MAJOR GENERAL (KITS) ×3 IMPLANT
KIT REMOVER STAPLE SKIN (MISCELLANEOUS) IMPLANT
KIT ROOM TURNOVER APOR (KITS) ×3 IMPLANT
LIGASURE IMPACT 36 18CM CVD LR (INSTRUMENTS) IMPLANT
MANIFOLD NEPTUNE II (INSTRUMENTS) ×3 IMPLANT
NS IRRIG 1000ML POUR BTL (IV SOLUTION) ×4 IMPLANT
PACK ABDOMINAL MAJOR (CUSTOM PROCEDURE TRAY) ×3 IMPLANT
PAD ARMBOARD 7.5X6 YLW CONV (MISCELLANEOUS) ×3 IMPLANT
POUCH OSTOMY 2 3/4  H 3804 (WOUND CARE)
POUCH OSTOMY 2 3/4 H 3804 (WOUND CARE)
POUCH OSTOMY 2 PC DRNBL 2.75 (WOUND CARE) IMPLANT
RELOAD LINEAR CUT PROX 55 BLUE (ENDOMECHANICALS) ×6 IMPLANT
RELOAD PROXIMATE 75MM BLUE (ENDOMECHANICALS) IMPLANT
RELOAD STAPLE 55 3.8 BLU REG (ENDOMECHANICALS) IMPLANT
RELOAD STAPLE 75 3.8 BLU REG (ENDOMECHANICALS) IMPLANT
RETRACTOR WND ALEXIS 25 LRG (MISCELLANEOUS) IMPLANT
RETRACTOR WOUND ALXS 34CM XLRG (MISCELLANEOUS) IMPLANT
RTRCTR WOUND ALEXIS 25CM LRG (MISCELLANEOUS) ×3
RTRCTR WOUND ALEXIS 34CM XLRG (MISCELLANEOUS)
SEALER TISSUE X1 CVD JAW (INSTRUMENTS) ×1 IMPLANT
SET BASIN LINEN APH (SET/KITS/TRAYS/PACK) ×3 IMPLANT
SHEARS HARMONIC 23CM COAG (MISCELLANEOUS) IMPLANT
SPONGE GAUZE 4X4 12PLY (GAUZE/BANDAGES/DRESSINGS) ×3 IMPLANT
SPONGE LAP 18X18 X RAY DECT (DISPOSABLE) ×2 IMPLANT
STAPLER GUN LINEAR PROX 60 (STAPLE) ×1 IMPLANT
STAPLER PROXIMATE 55 BLUE (STAPLE) ×1 IMPLANT
STAPLER PROXIMATE 75MM BLUE (STAPLE) IMPLANT
STAPLER VISISTAT (STAPLE) ×3 IMPLANT
SUCTION POOLE TIP (SUCTIONS) ×3 IMPLANT
SUT CHROMIC 0 SH (SUTURE) ×2 IMPLANT
SUT CHROMIC 2 0 SH (SUTURE) ×3 IMPLANT
SUT NOVA NAB GS-26 0 60 (SUTURE) ×4 IMPLANT
SUT PDS AB 0 CTX 60 (SUTURE) ×2 IMPLANT
SUT SILK 2 0 (SUTURE)
SUT SILK 2 0 REEL (SUTURE) ×2 IMPLANT
SUT SILK 2-0 18XBRD TIE 12 (SUTURE) ×2 IMPLANT
SUT SILK 3 0 SH CR/8 (SUTURE) ×5 IMPLANT
TAPE PAPER 3X10 WHT MICROPORE (GAUZE/BANDAGES/DRESSINGS) ×1 IMPLANT
TOWEL BLUE STERILE X RAY DET (MISCELLANEOUS) ×2 IMPLANT
TRAY FOLEY BAG SILVER LF 14FR (CATHETERS) ×1 IMPLANT
TRAY FOLEY CATH 14FR (SET/KITS/TRAYS/PACK) ×2 IMPLANT

## 2011-01-05 NOTE — Anesthesia Preprocedure Evaluation (Addendum)
Anesthesia Evaluation  Patient identified by MRN, date of birth, ID band Patient awake    Reviewed: Allergy & Precautions, H&P , NPO status , Patient's Chart, lab work & pertinent test results  History of Anesthesia Complications Negative for: history of anesthetic complications  Airway Mallampati: I      Dental  (+) Teeth Intact   Pulmonary  Hx DVT's clear to auscultation        Cardiovascular Regular Normal Factor V Leiden w/ hx of DVT's. Coumadin stopped 5 days ago.   Neuro/Psych PSYCHIATRIC DISORDERS Anxiety    GI/Hepatic GERD-  Medicated and Controlled,  Endo/Other    Renal/GU      Musculoskeletal   Abdominal   Peds  Hematology   Anesthesia Other Findings   Reproductive/Obstetrics                           Anesthesia Physical Anesthesia Plan  ASA: III  Anesthesia Plan: General   Post-op Pain Management:    Induction: Intravenous, Rapid sequence and Cricoid pressure planned  Airway Management Planned: Oral ETT  Additional Equipment:   Intra-op Plan:   Post-operative Plan: Extubation in OR  Informed Consent: I have reviewed the patients History and Physical, chart, labs and discussed the procedure including the risks, benefits and alternatives for the proposed anesthesia with the patient or authorized representative who has indicated his/her understanding and acceptance.     Plan Discussed with:   Anesthesia Plan Comments:         Anesthesia Quick Evaluation

## 2011-01-05 NOTE — Anesthesia Procedure Notes (Signed)
Procedure Name: Intubation Date/Time: 01/05/2011 3:13 PM Performed by: Minerva Areola Pre-anesthesia Checklist: Patient identified, Patient being monitored, Timeout performed, Emergency Drugs available and Suction available Patient Re-evaluated:Patient Re-evaluated prior to inductionOxygen Delivery Method: Circle System Utilized Preoxygenation: Pre-oxygenation with 100% oxygen Intubation Type: Rapid sequence and Circoid Pressure applied Ventilation: Mask ventilation without difficulty Laryngoscope Size: Miller and 2 Grade View: Grade II Tube type: Oral Tube size: 7.0 mm Number of attempts: 1 Airway Equipment and Method: stylet Placement Confirmation: ETT inserted through vocal cords under direct vision,  positive ETCO2 and breath sounds checked- equal and bilateral Secured at: 21 cm Tube secured with: Tape Dental Injury: Teeth and Oropharynx as per pre-operative assessment

## 2011-01-05 NOTE — Op Note (Signed)
Patient:  [name]  DOB:  [birth year]  MRN:  [id number]   Preop Diagnosis:  Small bowel neoplasm  Postop Diagnosis:  Same  Procedure:  Exploratory laparotomy, partial small bowel resection  Surgeon:  Franky Macho, M.D.  Anes:  General endotracheal  Indications:  Patient is a 60 year old white female with a long-standing history of idiopathic gastrointestinal bleeding and anemia of chronic disease who was recently found on Givens capsule study as well CT scan of the abdomen pelvis to have a small bowel neoplasm in the mid portion of the small bowel tract. The patient comes the operative for expiratory laparotomy, partial small bowel resection. The risks and benefits of the procedure including bleeding, infection, cardiopulmonary difficulties, and the possibility of DVT or pulmonary embolism do to her history of factor V Leiden deficiency were fully explained to the patient, gave informed consent.  Procedure note:  Patient is placed the supine position. After induction of general endotracheal anesthesia, the abdomen was prepped and draped using usual sterile technique with DuraPrep. Surgical site confirmation was performed.  A lower midline incision was made from the umbilicus to the suprapubic region. The peritoneal cavity was entered into without difficulty. The liver was inspected and noted within normal limits. The gallbladder, stomach, and proximal small bowel were all well within normal limits. In the midportion of the small bowel, a tumorous mass was 6 pending from the antimesenteric port or of the small intestine and had attached to the omentum and sigmoid colon adipose tissue. This was freed away the rest of the distal small intestine down to the terminal ileum was within normal limits. The colon was within normal limits. It was elected to proceed with a partial small bowel resection. Approximately 10 inches of small intestine was excised. This was done in a V-shaped manner to include  the mesentery. No lymphadenopathy was noted. A GIA stapler was placed proximally distally across the small intestine and fired. The mesenteric mesentery was then divided using the appendiceal. The specimen sent to pathology further examination. A side to side enteroenterostomy was done using a GIA 55 stapler. The enterotomy was closed using a TA 60 stapler. The stapler was posterior using 3-0 silk Lembert sutures. The mesenteric defect was closed using a 2-0 chromic gut running suture. The bowel was returned to the abdominal cavity in an orderly fashion. The abdomen was copious irrigated normal saline. All fluid was then evacuated from the abdominal cavity no abnormal bleeding was noted the end of the procedure. The fascia was reapproximated using a looped 0 PDS suture. The septations there is irrigated normal saline and the skin was closed using staples. Betadine ointment dorsal dressings were applied.  All tape and needle counts were correct at the end of the procedure. Patient was extubated in the operating room and went back to recovery room awake in stable condition.  Complications:  None  EBL:  50 cc  Specimen:  Partial small bowel

## 2011-01-05 NOTE — Progress Notes (Signed)
Abdominopelvic CT reviewed. It shows 5 x 4 x 4.2 cm soft tissue mass involving small bowel corresponding to findings on given capsule study. Patient to have all bowel resection later today. Patient's questions answered.

## 2011-01-05 NOTE — Progress Notes (Signed)
Subjective: Other than being anxious about the surgery this afternoon, she has no new complaints.  Objective: Vital signs in last 24 hours: Filed Vitals:   01/04/11 1000 01/04/11 2154 01/05/11 0152 01/05/11 0610  BP: 144/79 127/76 106/68 126/78  Pulse: 72 76 73 73  Temp: 97.6 F (36.4 C) 98.3 F (36.8 C) 97.8 F (36.6 C) 97.7 F (36.5 C)  TempSrc:  Oral Oral Oral  Resp: 20 20 20 20   Height:      Weight:      SpO2: 97% 95% 93% 97%    Intake/Output Summary (Last 24 hours) at 01/05/11 0942 Last data filed at 01/05/11 0600  Gross per 24 hour  Intake   1403 ml  Output      0 ml  Net   1403 ml    Weight change:   Exam:  Lungs: Occasional crackles in the bases, breathing nonlabored. Heart: S1, S2, with a soft systolic murmur. Abdomen: Obese, positive bowel sounds, nontender, nondistended. Extremities: No pedal edema. Skin: Mild urticarial type rash on the volar surface of her left forearm.  Lab Results: Basic Metabolic Panel:  Basename 01/05/11 0507 01/04/11 1920 01/04/11 0421  NA 140 140 --  K 4.0 3.6 --  CL 109 109 --  CO2 22 24 --  GLUCOSE 105* 129* --  BUN 5* 5* --  CREATININE 0.62 0.62 --  CALCIUM 9.5 9.2 --  MG -- -- 2.1  PHOS -- -- 3.9   Liver Function Tests: No results found for this basename: AST:2,ALT:2,ALKPHOS:2,BILITOT:2,PROT:2,ALBUMIN:2 in the last 72 hours No results found for this basename: LIPASE:2,AMYLASE:2 in the last 72 hours No results found for this basename: AMMONIA:2 in the last 72 hours CBC:  Basename 01/05/11 0507 01/04/11 1920  WBC 4.8 6.3  NEUTROABS -- 3.3  HGB 10.2* 10.4*  HCT 31.0* 31.8*  MCV 85.9 85.7  PLT 297 299   Cardiac Enzymes: No results found for this basename: CKTOTAL:3,CKMB:3,CKMBINDEX:3,TROPONINI:3 in the last 72 hours BNP: No results found for this basename: POCBNP:3 in the last 72 hours D-Dimer: No results found for this basename: DDIMER:2 in the last 72 hours CBG: No results found for this basename:  GLUCAP:6 in the last 72 hours Hemoglobin A1C: No results found for this basename: HGBA1C in the last 72 hours Fasting Lipid Panel: No results found for this basename: CHOL,HDL,LDLCALC,TRIG,CHOLHDL,LDLDIRECT in the last 72 hours Thyroid Function Tests: No results found for this basename: TSH,T4TOTAL,FREET4,T3FREE,THYROIDAB in the last 72 hours Anemia Panel: No results found for this basename: VITAMINB12,FOLATE,FERRITIN,TIBC,IRON,RETICCTPCT in the last 72 hours Coagulation:  Basename 01/05/11 0507  LABPROT 15.2  INR 1.18   Urine Drug Screen: Drugs of Abuse  No results found for this basename: labopia,  cocainscrnur,  labbenz,  amphetmu,  thcu,  labbarb    Alcohol Level: No results found for this basename: ETH:2 in the last 72 hours   Micro: Recent Results (from the past 240 hour(s))  CULTURE, BLOOD (ROUTINE X 2)     Status: Normal (Preliminary result)   Collection Time   01/01/11  9:10 AM      Component Value Range Status Comment   Specimen Description BLOOD RIGHT HAND   Final    Special Requests BOTTLES DRAWN AEROBIC ONLY 6 CC   Final    Culture NO GROWTH 2 DAYS   Final    Report Status PENDING   Incomplete   CULTURE, BLOOD (ROUTINE X 2)     Status: Normal (Preliminary result)   Collection Time  01/01/11  9:17 AM      Component Value Range Status Comment   Specimen Description BLOOD RIGHT ARM   Final    Special Requests BOTTLES DRAWN AEROBIC AND ANAEROBIC 10 CC EACH   Final    Culture NO GROWTH 2 DAYS   Final    Report Status PENDING   Incomplete   MRSA PCR SCREENING     Status: Normal   Collection Time   01/01/11  1:17 PM      Component Value Range Status Comment   MRSA by PCR NEGATIVE  NEGATIVE  Final     Studies/Results: No results found.  Medications: I have reviewed the patient's current medications.  Assessment: Principal Problem:  *GI BLEEDING Active Problems:  ANEMIA, IRON DEFICIENCY, CHRONIC  Reflux esophagitis  DVT (deep venous thrombosis)   Pernicious anemia  Factor V Leiden  Syncope  Acute blood loss anemia  Laceration of forehead  Chronic ulcer of left leg  Chronic anticoagulation  Abnormal CXR  Lip swelling  Small bowel mass  Allergic urticaria  1. Ulcerated small bowel mass seen via capsule endoscopy and on the recent CT scan of the abdomen and pelvis.  Planned operation today by Dr. Lovell Sheehan.  Factor V deficiency and history of DVT. Dr. Lovell Sheehan noted that a heparin drip will be started. After the surgery. In the meantime, subcutaneous prophylactic heparin was started. The patient apparently has an allergy to Lovenox.  Acute blood loss anemia. Status post transfusions. Her hemoglobin is actually improving.  Multiple allergies. She has an allergic urticarial rash on the left forearm from previous tape. Ordered hydrocortisone cream twice a day for 3 days. Her lip edema has resolved. She believes it may have been secondary to the vanilla in pudding.  Mild hypokalemia. She is receiving potassium in the IV fluids and orally.  Chest x-ray on admission was suggestive of pneumonia. Clinically, the patient does not appear to have pneumonia.     Plan:  Continue current management.  Exploratory laparotomy and partial small bowel resection today per Dr. Lovell Sheehan.     LOS: 4 days   Kourtnie Sachs 01/05/2011, 9:42 AM

## 2011-01-05 NOTE — Anesthesia Postprocedure Evaluation (Addendum)
Anesthesia Post Note  Patient: [name]  Procedure(s) Performed:  EXPLORATORY LAPAROTOMY; SMALL BOWEL RESECTION - Partial Small Bowel Resection  Anesthesia type: General  Patient location: PACU  Post pain: Pain level moderate  Post assessment: Post-op Vital signs reviewed, Patient's Cardiovascular Status Stable, Respiratory Function Stable, Patent Airway, No signs of Nausea or vomiting and Pain level controlled  Last Vitals:  Filed Vitals:   01/05/11 1617  BP: 121/57  Pulse: 67  Temp: 36.7 C  Resp: 20    Post vital signs: Reviewed and stable  Level of consciousness: awake and alert   Complications: No apparent anesthesia complications  01/06/11  Patient doing well, VSS.  No apparent anesthesia complications.

## 2011-01-05 NOTE — Transfer of Care (Signed)
Immediate Anesthesia Transfer of Care Note  Patient: [name]  Procedure(s) Performed:  EXPLORATORY LAPAROTOMY; SMALL BOWEL RESECTION - Partial Small Bowel Resection  Patient Location: PACU  Anesthesia Type: General  Level of Consciousness: awake  Airway & Oxygen Therapy: Patient Spontanous Breathing and non-rebreather face mask  Post-op Assessment: Report given to PACU RN, Post -op Vital signs reviewed and stable and Patient moving all extremities  Post vital signs: Reviewed and stable  Complications: No apparent anesthesia complications

## 2011-01-06 ENCOUNTER — Encounter (HOSPITAL_COMMUNITY): Payer: Medicare Other

## 2011-01-06 ENCOUNTER — Inpatient Hospital Stay (HOSPITAL_COMMUNITY): Payer: Medicare Other

## 2011-01-06 LAB — CBC
HCT: 28.3 % — ABNORMAL LOW (ref 36.0–46.0)
MCH: 28.1 pg (ref 26.0–34.0)
MCV: 87.3 fL (ref 78.0–100.0)
Platelets: 294 10*3/uL (ref 150–400)
RBC: 3.24 MIL/uL — ABNORMAL LOW (ref 3.87–5.11)
RDW: 15.6 % — ABNORMAL HIGH (ref 11.5–15.5)

## 2011-01-06 LAB — CULTURE, BLOOD (ROUTINE X 2): Culture: NO GROWTH

## 2011-01-06 LAB — BASIC METABOLIC PANEL
BUN: 9 mg/dL (ref 6–23)
Creatinine, Ser: 0.6 mg/dL (ref 0.50–1.10)
GFR calc Af Amer: >90 mL/min (ref >90)
GFR calc non Af Amer: >90 mL/min (ref >90)

## 2011-01-06 LAB — HEPARIN LEVEL (UNFRACTIONATED): Heparin Unfractionated: 0.97 IU/mL — ABNORMAL HIGH (ref 0.30–0.70)

## 2011-01-06 MED ORDER — SODIUM CHLORIDE 0.9 % IJ SOLN
10.0000 mL | Freq: Two times a day (BID) | INTRAMUSCULAR | Status: DC
  Administered 2011-01-06 – 2011-01-11 (×7): 10 mL
  Filled 2011-01-06 (×8): qty 3

## 2011-01-06 MED ORDER — WARFARIN SODIUM 7.5 MG PO TABS
7.5000 mg | ORAL_TABLET | Freq: Once | ORAL | Status: AC
  Administered 2011-01-06: 7.5 mg via ORAL
  Filled 2011-01-06: qty 1

## 2011-01-06 MED ORDER — SODIUM CHLORIDE 0.9 % IJ SOLN
10.0000 mL | INTRAMUSCULAR | Status: DC | PRN
  Administered 2011-01-08 (×2): 10 mL
  Filled 2011-01-06 (×6): qty 3

## 2011-01-06 MED ORDER — SODIUM CHLORIDE 0.9 % IJ SOLN
INTRAMUSCULAR | Status: AC
  Administered 2011-01-06: 17:00:00
  Filled 2011-01-06: qty 3

## 2011-01-06 MED ORDER — HEPARIN SOD (PORCINE) IN D5W 100 UNIT/ML IV SOLN
16.0000 [IU]/kg/h | INTRAVENOUS | Status: DC
  Administered 2011-01-06: 16 [IU]/kg/h via INTRAVENOUS
  Filled 2011-01-06 (×2): qty 250

## 2011-01-06 MED ORDER — HEPARIN SOD (PORCINE) IN D5W 100 UNIT/ML IV SOLN: 600.0000 [IU]/h | INTRAVENOUS | Status: DC

## 2011-01-06 NOTE — Progress Notes (Signed)
Present with patient and husband for support. Follow-up after surgery.

## 2011-01-06 NOTE — Addendum Note (Signed)
Addendum  created 01/06/11 0815 by Glynn Octave   Modules edited:Notes Section

## 2011-01-06 NOTE — Progress Notes (Signed)
ANTICOAGULATION CONSULT NOTE - Initial Consult  Pharmacy Consult for Heparin and Warfarin Indication:  Resume chronic anticoagulation for Factor V deficiency. Bridging Heparin and Warfarin.  Allergies  Allergen Reactions  . Cephalexin Swelling  . Dexlansoprazole Swelling  . Latex Itching  . Other Swelling    Patient states that Pecans cause her mouth to swell.  . Pantoprazole Sodium Swelling  . Penicillins Swelling  . Tape Itching  . Lovenox Itching, Swelling and Palpitations  . Nylon Rash  . Omeprazole Itching and Rash  . Sulfonamide Derivatives Rash  . Vanilla Swelling    Lips swell with vanilla pudding    Patient Measurements: Height: 5\' 7"  (170.2 cm) Weight: 230 lb (104.327 kg) IBW/kg (Calculated) : 61.6  Adjusted Body Weight: 87 kg Vital Signs: Temp: 98.1 F (36.7 C) (12/11 0616) Temp src: Oral (12/11 0616) BP: 106/68 mmHg (12/11 0616) Pulse Rate: 97  (12/11 0616)  Labs:  Basename 01/06/11 0554 01/05/11 0507 01/04/11 1920  HGB 9.1* 10.2* --  HCT 28.3* 31.0* 31.8*  PLT 294 297 299  APTT -- -- --  LABPROT 15.1 15.2 --  INR 1.17 1.18 --  HEPARINUNFRC -- -- --  CREATININE 0.60 0.62 0.62  CKTOTAL -- -- --  CKMB -- -- --  TROPONINI -- -- --   Estimated Creatinine Clearance: 94.1 ml/min (by C-G formula based on Cr of 0.6).  Medical History: Past Medical History  Diagnosis Date  . Vitamin B12 deficiency     vit b12 1000 mcg monthly  . Cellulitis of left leg 2006  . Ulcer 05/2009    esophageal  . Clotting disorder     heterozygosity from factor v leiden  . Pernicious anemia 07/10/2010  . DVT (deep venous thrombosis) 07/10/2010  . Factor V Leiden   . History of blood clots     lower legs  . Blood dyscrasia   . Anxiety   . Small bowel mass 01/03/2011  . Allergic urticaria 01/04/2011    Rash from tape.    Medications:  Scheduled:    . acetaminophen  1,000 mg Intravenous Q6H  . ALPRAZolam  0.5 mg Oral QHS  . famotidine  20 mg Oral BID  . fentaNYL       . glycopyrrolate      . glycopyrrolate      . heparin  5,000 Units Subcutaneous Once  . hydrocortisone cream   Topical BID  . labetalol      . lidocaine      . midazolam      . midazolam      . propofol      . rocuronium      . rocuronium      . sodium chloride  10 mL Intracatheter Q12H  . vancomycin  1,500 mg Intravenous 120 min pre-op  . warfarin  7.5 mg Oral ONCE-1800  . DISCONTD: heparin subcutaneous  5,000 Units Subcutaneous Q8H  . DISCONTD: heparin  5,000 Units Subcutaneous Q8H    Assessment: Resume chronic anticoagulation for Factor V deficiency. Bridging Heparin and Warfarin. Home dose 7 mg Warfarin daily. Surgery has requested no bolus on Heparin. Enoxaparin allergy listed but patient has tolerated sub cutaneous Heparin. Goal of Therapy:  Heparin level 0.3-0.7 units/ml INR 2 -3  Plan:  Heparin drip at 16 unis per kg per hour. Heparin levels accordingly. Warfarin 7.5 mg today. Daily INR  Gilman Buttner, [place] J 01/06/2011,10:08 AM

## 2011-01-06 NOTE — Progress Notes (Signed)
1 Day Post-Op  Subjective: Mild incisional pain, otherwise doing well.  Objective: Vital signs in last 24 hours: Temp:  [97.3 F (36.3 C)-98.1 F (36.7 C)] 98.1 F (36.7 C) (12/11 0616) Pulse Rate:  [52-97] 97  (12/11 0616) Resp:  [12-30] 20  (12/11 0616) BP: (106-151)/(49-91) 106/68 mmHg (12/11 0616) SpO2:  [94 %-100 %] 94 % (12/11 0616) Last BM Date: 01/05/11  Intake/Output from previous day: 12/10 0701 - 12/11 0700 In: 2413.3 [P.O.:30; I.V.:2383.3] Out: 680 [Urine:630; Blood:50] Intake/Output this shift:    General appearance: alert, cooperative and no distress Resp: clear to auscultation bilaterally Cardio: regular rate and rhythm, S1, S2 normal, no murmur, click, rub or gallop GI: Abdomen soft, dressings dry and intact. Minimal bowel sounds heard.  Lab Results:   [facility] 01/06/11 0554 01/05/11 0507  WBC 7.4 4.8  HGB 9.1* 10.2*  HCT 28.3* 31.0*  PLT 294 297   BMET  Basename 01/06/11 0554 01/05/11 0507  NA 139 140  K 4.1 4.0  CL 108 109  CO2 25 22  GLUCOSE 101* 105*  BUN 9 5*  CREATININE 0.60 0.62  CALCIUM 9.1 9.5   PT/INR  Basename 01/06/11 0554 01/05/11 0507  LABPROT 15.1 15.2  INR 1.17 1.18    Studies/Results: Chest Portable 1 View Post Insertion To Confirm Placement As Interpreted By Radiologist  01/06/2011  *RADIOLOGY REPORT*  Clinical Data: Right PICC line placement.  PORTABLE CHEST - 1 VIEW  Comparison: Chest x-ray 01/01/2011.  Findings: The right PICC line tip is in the proximal SVC.  This could be advanced 4 cm.  The lungs show improved aeration. Bibasilar atelectasis is noted.  IMPRESSION:  1.  The right PICC line tip is in the proximal SVC and could be advanced 4 cm. 2.  Slight overall improved lung aeration.  Original Report Authenticated By: P. Loralie Champagne, M.D.    Anti-infectives: Anti-infectives     Start     Dose/Rate Route Frequency Ordered Stop   01/04/11 1854   vancomycin (VANCOCIN) 1,500 mg in sodium chloride 0.9 % 500 mL IVPB      Comments: To be given in Holding area.      1,500 mg 250 mL/hr over 120 Minutes Intravenous 120 min pre-op 01/04/11 1855 01/05/11 1637   01/01/11 0915   Levofloxacin (LEVAQUIN) IVPB 750 mg        750 mg 100 mL/hr over 90 Minutes Intravenous  Once 01/01/11 0909 01/01/11 1114   01/01/11 0915   vancomycin (VANCOCIN) IVPB 1000 mg/200 mL premix  Status:  Discontinued        1,000 mg 200 mL/hr over 60 Minutes Intravenous  Once 01/01/11 0909 01/01/11 1029          Assessment/Plan: s/p Procedure(s): EXPLORATORY LAPAROTOMY SMALL BOWEL RESECTION Impression: Stable postoperatively Plan: We'll start heparin drip and by mouth Coumadin. Will advance diet once bowel function improves. Awaiting final pathology report.  LOS: 5 days    Welton Bord A 01/06/2011

## 2011-01-06 NOTE — Progress Notes (Signed)
ANTICOAGULATION CONSULT NOTE   Pharmacy Consult for Heparin Indication:  Resume chronic anticoagulation for Factor V deficiency. Bridging Heparin and Warfarin.  Allergies  Allergen Reactions  . Cephalexin Swelling  . Dexlansoprazole Swelling  . Latex Itching  . Other Swelling    Patient states that Pecans cause her mouth to swell.  . Pantoprazole Sodium Swelling  . Penicillins Swelling  . Tape Itching  . Lovenox Itching, Swelling and Palpitations  . Nylon Rash  . Omeprazole Itching and Rash  . Sulfonamide Derivatives Rash  . Vanilla Swelling    Lips swell with vanilla pudding    Patient Measurements: Height: 5\' 7"  (170.2 cm) Weight: 230 lb (104.327 kg) IBW/kg (Calculated) : 61.6  Adjusted Body Weight: 87 kg Vital Signs: Temp: 98.3 F (36.8 C) (12/11 1300) Temp src: Oral (12/11 1300) BP: 120/78 mmHg (12/11 1300) Pulse Rate: 68  (12/11 1300)  Labs:  Basename 01/06/11 1650 01/06/11 0554 01/05/11 0507 01/04/11 1920  HGB -- 9.1* 10.2* --  HCT -- 28.3* 31.0* 31.8*  PLT -- 294 297 299  APTT -- -- -- --  LABPROT -- 15.1 15.2 --  INR -- 1.17 1.18 --  HEPARINUNFRC 0.97* -- -- --  CREATININE -- 0.60 0.62 0.62  CKTOTAL -- -- -- --  CKMB -- -- -- --  TROPONINI -- -- -- --   Estimated Creatinine Clearance: 94.1 ml/min (by C-G formula based on Cr of 0.6).  Medical History: Past Medical History  Diagnosis Date  . Vitamin B12 deficiency     vit b12 1000 mcg monthly  . Cellulitis of left leg 2006  . Ulcer 05/2009    esophageal  . Clotting disorder     heterozygosity from factor v leiden  . Pernicious anemia 07/10/2010  . DVT (deep venous thrombosis) 07/10/2010  . Factor V Leiden   . History of blood clots     lower legs  . Blood dyscrasia   . Anxiety   . Small bowel mass 01/03/2011  . Allergic urticaria 01/04/2011    Rash from tape.    Medications:  Scheduled:     . acetaminophen  1,000 mg Intravenous Q6H  . ALPRAZolam  0.5 mg Oral QHS  . famotidine  20 mg  Oral BID  . fentaNYL      . glycopyrrolate      . glycopyrrolate      . hydrocortisone cream   Topical BID  . labetalol      . lidocaine      . midazolam      . midazolam      . propofol      . rocuronium      . rocuronium      . sodium chloride  10 mL Intracatheter Q12H  . sodium chloride      . warfarin  7.5 mg Oral ONCE-1800  . DISCONTD: heparin  5,000 Units Subcutaneous Q8H    Assessment: Heparin level 0.97 . Goal of Therapy:  Heparin level 0.3-0.7 units/ml INR 2 -3  Plan:  Heparin decreased to 1050 Units/hr (10.5 ml/hr) Heparin level at 2 AM 01/07/11 Monitor level and adjust rate accordingly  [name], Sade Mehlhoff Bennett 01/06/2011,6:50 PM

## 2011-01-07 LAB — BASIC METABOLIC PANEL
BUN: 7 mg/dL (ref 6–23)
Calcium: 8.2 mg/dL — ABNORMAL LOW (ref 8.4–10.5)
Chloride: 105 mEq/L (ref 96–112)
Creatinine, Ser: 0.57 mg/dL (ref 0.50–1.10)
GFR calc Af Amer: >90 mL/min (ref >90)
GFR calc non Af Amer: >90 mL/min (ref >90)

## 2011-01-07 LAB — CBC
HCT: 26.5 % — ABNORMAL LOW (ref 36.0–46.0)
Hemoglobin: 8.3 g/dL — ABNORMAL LOW (ref 12.0–15.0)
MCH: 27.5 pg (ref 26.0–34.0)
MCHC: 31.3 g/dL (ref 30.0–36.0)
MCV: 87.7 fL (ref 78.0–100.0)
RDW: 15.5 % (ref 11.5–15.5)

## 2011-01-07 LAB — PHOSPHORUS: Phosphorus: 3.7 mg/dL (ref 2.3–4.6)

## 2011-01-07 MED ORDER — HYDROCODONE-ACETAMINOPHEN 5-325 MG PO TABS
1.0000 | ORAL_TABLET | ORAL | Status: DC | PRN
  Administered 2011-01-07 – 2011-01-10 (×3): 1 via ORAL
  Administered 2011-01-11: 2 via ORAL
  Filled 2011-01-07 (×2): qty 1
  Filled 2011-01-07: qty 2
  Filled 2011-01-07: qty 1

## 2011-01-07 MED ORDER — HEPARIN SOD (PORCINE) IN D5W 100 UNIT/ML IV SOLN
800.0000 [IU]/h | INTRAVENOUS | Status: DC
  Administered 2011-01-07: 800 [IU]/h via INTRAVENOUS
  Filled 2011-01-07 (×2): qty 250

## 2011-01-07 MED ORDER — WARFARIN SODIUM 6 MG PO TABS
6.0000 mg | ORAL_TABLET | Freq: Once | ORAL | Status: AC
  Administered 2011-01-07: 6 mg via ORAL
  Filled 2011-01-07: qty 1

## 2011-01-07 MED ORDER — SODIUM CHLORIDE 0.9 % IJ SOLN
INTRAMUSCULAR | Status: AC
  Administered 2011-01-07: 10 mL
  Filled 2011-01-07: qty 3

## 2011-01-07 NOTE — Progress Notes (Signed)
ANTICOAGULATION CONSULT NOTE   Pharmacy Consult for Heparin Indication:  Resume chronic anticoagulation for Factor V deficiency. Bridging Heparin and Warfarin.  Allergies  Allergen Reactions  . Cephalexin Swelling  . Dexlansoprazole Swelling  . Latex Itching  . Other Swelling    Patient states that Pecans cause her mouth to swell.  . Pantoprazole Sodium Swelling  . Penicillins Swelling  . Tape Itching  . Lovenox Itching, Swelling and Palpitations  . Nylon Rash  . Omeprazole Itching and Rash  . Sulfonamide Derivatives Rash  . Vanilla Swelling    Lips swell with vanilla pudding   Patient Measurements: Height: 5\' 7"  (170.2 cm) Weight: 230 lb (104.327 kg) IBW/kg (Calculated) : 61.6  Adjusted Body Weight: 87 kg Vital Signs: Temp: 98.7 F (37.1 C) (12/12 2058) Temp src: Oral (12/12 2058) BP: 120/69 mmHg (12/12 2058) Pulse Rate: 55  (12/12 2058)  Labs:  Basename 01/07/11 2201 01/07/11 1553 01/07/11 0242 01/07/11 0241 01/06/11 0554 01/05/11 0507  HGB -- -- 8.3* -- 9.1* --  HCT -- -- 26.5* -- 28.3* 31.0*  PLT -- -- 241 -- 294 297  APTT -- -- -- -- -- --  LABPROT -- -- 15.9* -- 15.1 15.2  INR -- -- 1.24 -- 1.17 1.18  HEPARINUNFRC <0.10* <0.10* -- 0.92* -- --  CREATININE -- -- 0.57 -- 0.60 0.62  CKTOTAL -- -- -- -- -- --  CKMB -- -- -- -- -- --  TROPONINI -- -- -- -- -- --   Estimated Creatinine Clearance: 94.1 ml/min (by C-G formula based on Cr of 0.57).  Medical History: Past Medical History  Diagnosis Date  . Vitamin B12 deficiency     vit b12 1000 mcg monthly  . Cellulitis of left leg 2006  . Ulcer 05/2009    esophageal  . Clotting disorder     heterozygosity from factor v leiden  . Pernicious anemia 07/10/2010  . DVT (deep venous thrombosis) 07/10/2010  . Factor V Leiden   . History of blood clots     lower legs  . Blood dyscrasia   . Anxiety   . Small bowel mass 01/03/2011  . Allergic urticaria 01/04/2011    Rash from tape.   Medications:  Scheduled:     . ALPRAZolam  0.5 mg Oral QHS  . famotidine  20 mg Oral BID  . hydrocortisone cream   Topical BID  . sodium chloride  10 mL Intracatheter Q12H  . sodium chloride      . warfarin  6 mg Oral ONCE-1800   Assessment: Heparin level below goal . Goal of Therapy:  Heparin level 0.3-0.7 units/ml INR 2 -3   Plan:  Increase heparin rate increased by 300 units/hr to 1100 units/hr Repeat heparin level at 7 AM CBC daily for now (platelets)  Raquel James, Mearl Olver Bennett 01/07/2011,10:49 PM

## 2011-01-07 NOTE — Plan of Care (Signed)
Problem: Phase II Progression Outcomes Goal: Progress activity as tolerated unless otherwise ordered Outcome: Progressing Ambulating in hallway

## 2011-01-07 NOTE — Progress Notes (Signed)
ANTICOAGULATION CONSULT NOTE   Pharmacy Consult for Heparin Indication:  Resume chronic anticoagulation for Factor V deficiency. Bridging Heparin and Warfarin.  Allergies  Allergen Reactions  . Cephalexin Swelling  . Dexlansoprazole Swelling  . Latex Itching  . Other Swelling    Patient states that Pecans cause her mouth to swell.  . Pantoprazole Sodium Swelling  . Penicillins Swelling  . Tape Itching  . Lovenox Itching, Swelling and Palpitations  . Nylon Rash  . Omeprazole Itching and Rash  . Sulfonamide Derivatives Rash  . Vanilla Swelling    Lips swell with vanilla pudding   Patient Measurements: Height: 5\' 7"  (170.2 cm) Weight: 230 lb (104.327 kg) IBW/kg (Calculated) : 61.6  Adjusted Body Weight: 87 kg Vital Signs: Temp: 98.1 F (36.7 C) (12/12 0600) BP: 115/72 mmHg (12/12 0600) Pulse Rate: 73  (12/12 0600)  Labs:  Basename 01/07/11 0242 01/07/11 0241 01/06/11 1650 01/06/11 0554 01/05/11 0507  HGB 8.3* -- -- 9.1* --  HCT 26.5* -- -- 28.3* 31.0*  PLT 241 -- -- 294 297  APTT -- -- -- -- --  LABPROT 15.9* -- -- 15.1 15.2  INR 1.24 -- -- 1.17 1.18  HEPARINUNFRC -- 0.92* 0.97* -- --  CREATININE 0.57 -- -- 0.60 0.62  CKTOTAL -- -- -- -- --  CKMB -- -- -- -- --  TROPONINI -- -- -- -- --   Estimated Creatinine Clearance: 94.1 ml/min (by C-G formula based on Cr of 0.57).  Medical History: Past Medical History  Diagnosis Date  . Vitamin B12 deficiency     vit b12 1000 mcg monthly  . Cellulitis of left leg 2006  . Ulcer 05/2009    esophageal  . Clotting disorder     heterozygosity from factor v leiden  . Pernicious anemia 07/10/2010  . DVT (deep venous thrombosis) 07/10/2010  . Factor V Leiden   . History of blood clots     lower legs  . Blood dyscrasia   . Anxiety   . Small bowel mass 01/03/2011  . Allergic urticaria 01/04/2011    Rash from tape.   Medications:  Scheduled:     . acetaminophen  1,000 mg Intravenous Q6H  . ALPRAZolam  0.5 mg Oral QHS  .  famotidine  20 mg Oral BID  . hydrocortisone cream   Topical BID  . sodium chloride  10 mL Intracatheter Q12H  . sodium chloride      . warfarin  7.5 mg Oral ONCE-1800  . DISCONTD: heparin  5,000 Units Subcutaneous Q8H   Assessment: Heparin level above goal . Goal of Therapy:  Heparin level 0.3-0.7 units/ml INR 2 -3  Plan:  Decrease heparin rate by 200 units/hr Repeat heparin level in 6 hours CBC daily for now (platelets)  Margo Aye, Chistina Roston A 01/07/2011,9:22 AM

## 2011-01-07 NOTE — Progress Notes (Signed)
2 Days Post-Op  Subjective: No complaints. No significant bowel movement or flatus yet.  Objective: Vital signs in last 24 hours: Temp:  [97.9 F (36.6 C)-98.3 F (36.8 C)] 98.1 F (36.7 C) (12/12 0600) Pulse Rate:  [62-77] 73  (12/12 0600) Resp:  [20-22] 20  (12/12 0600) BP: (110-120)/(70-78) 115/72 mmHg (12/12 0600) SpO2:  [92 %-97 %] 97 % (12/12 0600) Last BM Date: 01/05/11  Intake/Output from previous day: 12/11 0701 - 12/12 0700 In: 2736.4 [P.O.:600; I.V.:2136.4] Out: -  Intake/Output this shift:    General appearance: alert, cooperative and no distress Resp: clear to auscultation bilaterally Cardio: regular rate and rhythm, S1, S2 normal, no murmur, click, rub or gallop GI: Soft, flat. Incision healing well. Occasional bowel sounds heard.  Lab Results:   [facility] 01/07/11 0242 01/06/11 0554  WBC 6.4 7.4  HGB 8.3* 9.1*  HCT 26.5* 28.3*  PLT 241 294   BMET  Basename 01/07/11 0242 01/06/11 0554  NA 136 139  K 3.8 4.1  CL 105 108  CO2 24 25  GLUCOSE 127* 101*  BUN 7 9  CREATININE 0.57 0.60  CALCIUM 8.2* 9.1   PT/INR  Basename 01/07/11 0242 01/06/11 0554  LABPROT 15.9* 15.1  INR 1.24 1.17    Studies/Results: Chest Portable 1 View Post Insertion To Confirm Placement As Interpreted By Radiologist  01/06/2011  *RADIOLOGY REPORT*  Clinical Data: Right PICC line placement.  PORTABLE CHEST - 1 VIEW  Comparison: Chest x-ray 01/01/2011.  Findings: The right PICC line tip is in the proximal SVC.  This could be advanced 4 cm.  The lungs show improved aeration. Bibasilar atelectasis is noted.  IMPRESSION:  1.  The right PICC line tip is in the proximal SVC and could be advanced 4 cm. 2.  Slight overall improved lung aeration.  Original Report Authenticated By: P. Loralie Champagne, M.D.    Anti-infectives: Anti-infectives     Start     Dose/Rate Route Frequency Ordered Stop   01/04/11 1854   vancomycin (VANCOCIN) 1,500 mg in sodium chloride 0.9 % 500 mL IVPB     Comments: To be given in Holding area.      1,500 mg 250 mL/hr over 120 Minutes Intravenous 120 min pre-op 01/04/11 1855 01/05/11 1637   01/01/11 0915   Levofloxacin (LEVAQUIN) IVPB 750 mg        750 mg 100 mL/hr over 90 Minutes Intravenous  Once 01/01/11 0909 01/01/11 1114   01/01/11 0915   vancomycin (VANCOCIN) IVPB 1000 mg/200 mL premix  Status:  Discontinued        1,000 mg 200 mL/hr over 60 Minutes Intravenous  Once 01/01/11 0909 01/01/11 1029          Assessment/Plan: s/p Procedure(s): EXPLORATORY LAPAROTOMY SMALL BOWEL RESECTION Impression: Progressing well. Awaiting return of bowel function. Patient is already started bridging with heparin and Coumadin. Hematocrit stable at this point. Plan: Advance to full liquid diet. Patient may ambulate. Final pathology pending. Continue to monitor CBC.  LOS: 6 days    Haeley Fordham A 01/07/2011

## 2011-01-07 NOTE — Progress Notes (Signed)
ANTICOAGULATION CONSULT NOTE   Pharmacy Consult for Heparin Indication:  Resume chronic anticoagulation for Factor V deficiency. Bridging Heparin and Warfarin.  Allergies  Allergen Reactions  . Cephalexin Swelling  . Dexlansoprazole Swelling  . Latex Itching  . Other Swelling    Patient states that Pecans cause her mouth to swell.  . Pantoprazole Sodium Swelling  . Penicillins Swelling  . Tape Itching  . Lovenox Itching, Swelling and Palpitations  . Nylon Rash  . Omeprazole Itching and Rash  . Sulfonamide Derivatives Rash  . Vanilla Swelling    Lips swell with vanilla pudding   Patient Measurements: Height: 5\' 7"  (170.2 cm) Weight: 230 lb (104.327 kg) IBW/kg (Calculated) : 61.6  Adjusted Body Weight: 87 kg Vital Signs: Temp: 98.1 F (36.7 C) (12/12 1020) Temp src: Oral (12/12 1020) BP: 150/70 mmHg (12/12 1020) Pulse Rate: 78  (12/12 1020)  Labs:  Basename 01/07/11 1553 01/07/11 0242 01/07/11 0241 01/06/11 1650 01/06/11 0554 01/05/11 0507  HGB -- 8.3* -- -- 9.1* --  HCT -- 26.5* -- -- 28.3* 31.0*  PLT -- 241 -- -- 294 297  APTT -- -- -- -- -- --  LABPROT -- 15.9* -- -- 15.1 15.2  INR -- 1.24 -- -- 1.17 1.18  HEPARINUNFRC <0.10* -- 0.92* 0.97* -- --  CREATININE -- 0.57 -- -- 0.60 0.62  CKTOTAL -- -- -- -- -- --  CKMB -- -- -- -- -- --  TROPONINI -- -- -- -- -- --   Estimated Creatinine Clearance: 94.1 ml/min (by C-G formula based on Cr of 0.57).  Medical History: Past Medical History  Diagnosis Date  . Vitamin B12 deficiency     vit b12 1000 mcg monthly  . Cellulitis of left leg 2006  . Ulcer 05/2009    esophageal  . Clotting disorder     heterozygosity from factor v leiden  . Pernicious anemia 07/10/2010  . DVT (deep venous thrombosis) 07/10/2010  . Factor V Leiden   . History of blood clots     lower legs  . Blood dyscrasia   . Anxiety   . Small bowel mass 01/03/2011  . Allergic urticaria 01/04/2011    Rash from tape.   Medications:  Scheduled:       . acetaminophen  1,000 mg Intravenous Q6H  . ALPRAZolam  0.5 mg Oral QHS  . famotidine  20 mg Oral BID  . hydrocortisone cream   Topical BID  . sodium chloride  10 mL Intracatheter Q12H  . sodium chloride      . sodium chloride      . warfarin  6 mg Oral ONCE-1800  . warfarin  7.5 mg Oral ONCE-1800   Assessment: Heparin level below goal . Goal of Therapy:  Heparin level 0.3-0.7 units/ml INR 2 -3  Plan:  Increase heparin rate by 200 units/hr Repeat heparin level 10 PM tonight CBC daily for now (platelets)  Raquel James, Larren Copes Bennett 01/07/2011,4:53 PM

## 2011-01-08 LAB — CBC
HCT: 30 % — ABNORMAL LOW (ref 36.0–46.0)
MCV: 89 fL (ref 78.0–100.0)
RBC: 3.37 MIL/uL — ABNORMAL LOW (ref 3.87–5.11)
RDW: 15.1 % (ref 11.5–15.5)
WBC: 6 10*3/uL (ref 4.0–10.5)

## 2011-01-08 LAB — HEPARIN LEVEL (UNFRACTIONATED)
Heparin Unfractionated: 0.92 IU/mL — ABNORMAL HIGH (ref 0.30–0.70)
Heparin Unfractionated: <0.1 IU/mL — ABNORMAL LOW (ref 0.30–0.70)

## 2011-01-08 LAB — PROTIME-INR: INR: 1.29 (ref 0.00–1.49)

## 2011-01-08 MED ORDER — WARFARIN SODIUM 7.5 MG PO TABS
7.5000 mg | ORAL_TABLET | Freq: Once | ORAL | Status: AC
  Administered 2011-01-08: 7.5 mg via ORAL
  Filled 2011-01-08: qty 1

## 2011-01-08 NOTE — Progress Notes (Signed)
3 Days Post-Op  Subjective: No change from yesterday. No significant incisional pain. No bowel movement or flatus yet.  Objective: Vital signs in last 24 hours: Temp:  [98.1 F (36.7 C)-98.7 F (37.1 C)] 98.3 F (36.8 C) (12/13 0503) Pulse Rate:  [55-78] 67  (12/13 0503) Resp:  [16-20] 16  (12/13 0503) BP: (115-150)/(69-78) 137/78 mmHg (12/13 0503) SpO2:  [90 %-96 %] 92 % (12/13 0503) Weight:  [104.6 kg (230 lb 9.6 oz)] 230 lb 9.6 oz (104.6 kg) (12/13 0503) Last BM Date: 01/05/11  Intake/Output from previous day: 12/12 0701 - 12/13 0700 In: 532 [P.O.:480; I.V.:52] Out: -  Intake/Output this shift:    General appearance: alert, cooperative and no distress Resp: clear to auscultation bilaterally Cardio: regular rate and rhythm, S1, S2 normal, no murmur, click, rub or gallop GI: Soft, flat. Occasional bowel sounds heard. Incision healing well.  Lab Results:   [facility] 01/08/11 0633 01/07/11 0242  WBC 6.0 6.4  HGB 9.1* 8.3*  HCT 30.0* 26.5*  PLT 269 241   BMET  Basename 01/07/11 0242 01/06/11 0554  NA 136 139  K 3.8 4.1  CL 105 108  CO2 24 25  GLUCOSE 127* 101*  BUN 7 9  CREATININE 0.57 0.60  CALCIUM 8.2* 9.1   PT/INR  Basename 01/08/11 0633 01/07/11 0242  LABPROT 16.3* 15.9*  INR 1.29 1.24    Studies/Results: No results found.  Anti-infectives: Anti-infectives     Start     Dose/Rate Route Frequency Ordered Stop   01/04/11 1854   vancomycin (VANCOCIN) 1,500 mg in sodium chloride 0.9 % 500 mL IVPB     Comments: To be given in Holding area.      1,500 mg 250 mL/hr over 120 Minutes Intravenous 120 min pre-op 01/04/11 1855 01/05/11 1637   01/01/11 0915   Levofloxacin (LEVAQUIN) IVPB 750 mg        750 mg 100 mL/hr over 90 Minutes Intravenous  Once 01/01/11 0909 01/01/11 1114   01/01/11 0915   vancomycin (VANCOCIN) IVPB 1000 mg/200 mL premix  Status:  Discontinued        1,000 mg 200 mL/hr over 60 Minutes Intravenous  Once 01/01/11 0909 01/01/11 1029            Assessment/Plan: s/p Procedure(s): EXPLORATORY LAPAROTOMY SMALL BOWEL RESECTION Impression: Recovering well from surgery. Awaiting full effect of Coumadin. Awaiting final pathology report. Awaiting return of bowel function. Plan: Will advance diet once patient has bowel movement. Heparin and Coumadin bridging per pharmacy.  LOS: 7 days    Walter Min A 01/08/2011

## 2011-01-08 NOTE — Progress Notes (Signed)
ANTICOAGULATION CONSULT NOTE   Pharmacy Consult for Heparin Indication:  Resume chronic anticoagulation for Factor V deficiency. Bridging Heparin and Warfarin.  Allergies  Allergen Reactions  . Cephalexin Swelling  . Dexlansoprazole Swelling  . Latex Itching  . Other Swelling    Patient states that Pecans cause her mouth to swell.  . Pantoprazole Sodium Swelling  . Penicillins Swelling  . Tape Itching  . Lovenox Itching, Swelling and Palpitations  . Nylon Rash  . Omeprazole Itching and Rash  . Sulfonamide Derivatives Rash  . Vanilla Swelling    Lips swell with vanilla pudding   Patient Measurements: Height: 5\' 7"  (170.2 cm) Weight: 230 lb 9.6 oz (104.6 kg) IBW/kg (Calculated) : 61.6  Adjusted Body Weight: 87 kg Vital Signs: Temp: 98.3 F (36.8 C) (12/13 0503) Temp src: Oral (12/13 0503) BP: 137/78 mmHg (12/13 0503) Pulse Rate: 67  (12/13 0503)  Labs:  Basename 01/08/11 4782 01/07/11 2201 01/07/11 1553 01/07/11 0242 01/06/11 0554  HGB 9.1* -- -- 8.3* --  HCT 30.0* -- -- 26.5* 28.3*  PLT 269 -- -- 241 294  APTT -- -- -- -- --  LABPROT 16.3* -- -- 15.9* 15.1  INR 1.29 -- -- 1.24 1.17  HEPARINUNFRC 0.76* <0.10* <0.10* -- --  CREATININE -- -- -- 0.57 0.60  CKTOTAL -- -- -- -- --  CKMB -- -- -- -- --  TROPONINI -- -- -- -- --   Estimated Creatinine Clearance: 94.2 ml/min (by C-G formula based on Cr of 0.57).  Medical History: Past Medical History  Diagnosis Date  . Vitamin B12 deficiency     vit b12 1000 mcg monthly  . Cellulitis of left leg 2006  . Ulcer 05/2009    esophageal  . Clotting disorder     heterozygosity from factor v leiden  . Pernicious anemia 07/10/2010  . DVT (deep venous thrombosis) 07/10/2010  . Factor V Leiden   . History of blood clots     lower legs  . Blood dyscrasia   . Anxiety   . Small bowel mass 01/03/2011  . Allergic urticaria 01/04/2011    Rash from tape.   Medications:  Scheduled:     . ALPRAZolam  0.5 mg Oral QHS  .  famotidine  20 mg Oral BID  . hydrocortisone cream   Topical BID  . sodium chloride  10 mL Intracatheter Q12H  . sodium chloride      . warfarin  6 mg Oral ONCE-1800   Assessment: Heparin level above goal . Goal of Therapy:  Heparin level 0.3-0.7 units/ml INR 2 -3  Plan:  Change heparin rate to 1000 units/hr Repeat heparin level in 6 hours CBC daily for now (platelets) Warfarin 7.5mg  PO x 1.    Lamonte Richer R 01/08/2011,9:32 AM

## 2011-01-08 NOTE — Progress Notes (Signed)
ANTICOAGULATION CONSULT NOTE   Pharmacy Consult for Heparin Indication:  Resume chronic anticoagulation for Factor V deficiency. Bridging Heparin and Warfarin.  Allergies  Allergen Reactions  . Cephalexin Swelling  . Dexlansoprazole Swelling  . Latex Itching  . Other Swelling    Patient states that Pecans cause her mouth to swell.  . Pantoprazole Sodium Swelling  . Penicillins Swelling  . Tape Itching  . Lovenox Itching, Swelling and Palpitations  . Nylon Rash  . Omeprazole Itching and Rash  . Sulfonamide Derivatives Rash  . Vanilla Swelling    Lips swell with vanilla pudding   Patient Measurements: Height: 5\' 7"  (170.2 cm) Weight: 230 lb 9.6 oz (104.6 kg) IBW/kg (Calculated) : 61.6  Adjusted Body Weight: 87 kg Vital Signs: Temp: 98.1 F (36.7 C) (12/13 1339) Temp src: Oral (12/13 1339) BP: 138/72 mmHg (12/13 1339) Pulse Rate: 91  (12/13 1339)  Labs:  Basename 01/08/11 1619 01/08/11 0454 01/07/11 2201 01/07/11 0242 01/06/11 0554  HGB -- 9.1* -- 8.3* --  HCT -- 30.0* -- 26.5* 28.3*  PLT -- 269 -- 241 294  APTT -- -- -- -- --  LABPROT -- 16.3* -- 15.9* 15.1  INR -- 1.29 -- 1.24 1.17  HEPARINUNFRC 0.92* 0.76* <0.10* -- --  CREATININE -- -- -- 0.57 0.60  CKTOTAL -- -- -- -- --  CKMB -- -- -- -- --  TROPONINI -- -- -- -- --   Estimated Creatinine Clearance: 94.2 ml/min (by C-G formula based on Cr of 0.57).  Medical History: Past Medical History  Diagnosis Date  . Vitamin B12 deficiency     vit b12 1000 mcg monthly  . Cellulitis of left leg 2006  . Ulcer 05/2009    esophageal  . Clotting disorder     heterozygosity from factor v leiden  . Pernicious anemia 07/10/2010  . DVT (deep venous thrombosis) 07/10/2010  . Factor V Leiden   . History of blood clots     lower legs  . Blood dyscrasia   . Anxiety   . Small bowel mass 01/03/2011  . Allergic urticaria 01/04/2011    Rash from tape.   Medications:  Scheduled:     . ALPRAZolam  0.5 mg Oral QHS  .  famotidine  20 mg Oral BID  . sodium chloride  10 mL Intracatheter Q12H  . warfarin  6 mg Oral ONCE-1800  . warfarin  7.5 mg Oral ONCE-1800   Assessment: Heparin level above goal . Goal of Therapy:  Heparin level 0.3-0.7 units/ml INR 2 -3  Plan:  Change heparin rate to 800 units/hr Repeat heparin level in 6 hours CBC daily for now (platelets) Warfarin 7.5mg  PO x 1.    Raquel James, Andy Allende [place] 01/08/2011,5:12 PM

## 2011-01-09 DIAGNOSIS — C179 Malignant neoplasm of small intestine, unspecified: Secondary | ICD-10-CM

## 2011-01-09 DIAGNOSIS — Z86718 Personal history of other venous thrombosis and embolism: Secondary | ICD-10-CM

## 2011-01-09 DIAGNOSIS — D649 Anemia, unspecified: Secondary | ICD-10-CM

## 2011-01-09 DIAGNOSIS — D6859 Other primary thrombophilia: Secondary | ICD-10-CM

## 2011-01-09 LAB — HEPARIN LEVEL (UNFRACTIONATED)
Heparin Unfractionated: <0.1 IU/mL — ABNORMAL LOW (ref 0.30–0.70)
Heparin Unfractionated: 0.1 IU/mL — ABNORMAL LOW (ref 0.30–0.70)

## 2011-01-09 LAB — TYPE AND SCREEN
Antibody Screen: NEGATIVE
Unit division: 0

## 2011-01-09 LAB — CBC
Hemoglobin: 9.1 g/dL — ABNORMAL LOW (ref 12.0–15.0)
MCH: 27.6 pg (ref 26.0–34.0)
MCHC: 31.7 g/dL (ref 30.0–36.0)
Platelets: 242 10*3/uL (ref 150–400)
RBC: 3.3 MIL/uL — ABNORMAL LOW (ref 3.87–5.11)

## 2011-01-09 LAB — PROTIME-INR
INR: 1.56 — ABNORMAL HIGH (ref 0.00–1.49)
Prothrombin Time: 19 seconds — ABNORMAL HIGH (ref 11.6–15.2)

## 2011-01-09 MED ORDER — HEPARIN SOD (PORCINE) IN D5W 100 UNIT/ML IV SOLN
16.0000 [IU]/kg/h | INTRAVENOUS | Status: DC
  Administered 2011-01-09: 16 [IU]/kg/h via INTRAVENOUS
  Filled 2011-01-09: qty 250

## 2011-01-09 MED ORDER — MAGNESIUM HYDROXIDE 400 MG/5ML PO SUSP: 30.0000 mL | Freq: Two times a day (BID) | ORAL | Status: DC | PRN

## 2011-01-09 MED ORDER — HEPARIN SOD (PORCINE) IN D5W 100 UNIT/ML IV SOLN: 12.0000 [IU]/kg/h | INTRAVENOUS | Status: DC

## 2011-01-09 MED ORDER — WARFARIN SODIUM 7.5 MG PO TABS
7.5000 mg | ORAL_TABLET | Freq: Once | ORAL | Status: AC
  Administered 2011-01-09: 7.5 mg via ORAL
  Filled 2011-01-09: qty 1

## 2011-01-09 MED ORDER — MAGNESIUM HYDROXIDE 400 MG/5ML PO SUSP
30.0000 mL | ORAL | Status: DC
  Administered 2011-01-09 – 2011-01-10 (×2): 30 mL via ORAL
  Filled 2011-01-09 (×3): qty 30

## 2011-01-09 NOTE — Progress Notes (Signed)
4 Days Post-Op  Subjective: Is passing gas today. No nausea or vomiting noted.  Objective: Vital signs in last 24 hours: Temp:  [98.1 F (36.7 C)-99 F (37.2 C)] 98.4 F (36.9 C) (12/14 0553) Pulse Rate:  [70-91] 78  (12/14 0553) Resp:  [16-18] 18  (12/14 0553) BP: (107-142)/(69-77) 107/69 mmHg (12/14 0553) SpO2:  [91 %-98 %] 91 % (12/14 0553) Last BM Date: 01/05/11  Intake/Output from previous day: 12/13 0701 - 12/14 0700 In: 611.3 [P.O.:240; I.V.:365.3; IV Piggyback:6] Out: 2 [Urine:2] Intake/Output this shift:    General appearance: alert, cooperative and no distress GI: Soft, nontender, nondistended. Incision healing well. Bowel sounds heard.  Lab Results:   [facility] 01/09/11 0536 01/08/11 0633  WBC 5.5 6.0  HGB 9.1* 9.1*  HCT 28.7* 30.0*  PLT 242 269   BMET  Basename 01/07/11 0242  NA 136  K 3.8  CL 105  CO2 24  GLUCOSE 127*  BUN 7  CREATININE 0.57  CALCIUM 8.2*   PT/INR  Basename 01/09/11 0536 01/08/11 0633  LABPROT 19.0* 16.3*  INR 1.56* 1.29    Studies/Results: No results found.  Anti-infectives: Anti-infectives     Start     Dose/Rate Route Frequency Ordered Stop   01/04/11 1854   vancomycin (VANCOCIN) 1,500 mg in sodium chloride 0.9 % 500 mL IVPB     Comments: To be given in Holding area.      1,500 mg 250 mL/hr over 120 Minutes Intravenous 120 min pre-op 01/04/11 1855 01/05/11 1637   01/01/11 0915   Levofloxacin (LEVAQUIN) IVPB 750 mg        750 mg 100 mL/hr over 90 Minutes Intravenous  Once 01/01/11 0909 01/01/11 1114   01/01/11 0915   vancomycin (VANCOCIN) IVPB 1000 mg/200 mL premix  Status:  Discontinued        1,000 mg 200 mL/hr over 60 Minutes Intravenous  Once 01/01/11 0909 01/01/11 1029          Assessment/Plan: s/p Procedure(s): EXPLORATORY LAPAROTOMY SMALL BOWEL RESECTION Impression: Bowel function slowly returning. Pulmonary pathology reveals a gastrointestinal stromal tumor of the small bowel. Mitotic rate is low.  Patient has been notified of the pulmonary pathology report. Plan: We'll discharged once patient is having a bowel movement and her Coumadin is therapeutic.  LOS: 8 days    [name] A 01/09/2011

## 2011-01-09 NOTE — Consult Note (Signed)
[facility] Consultation Oncology  Name: [name]      MRN: [id number]    Location: W295/A213-08  Date: 01/09/2011 Time:2:33 PM   REFERRING PHYSICIAN:  Franky Macho  REASON FOR CONSULT:  GIST   DIAGNOSIS:  5.8 cm, mitotic rate 1, Intermediate grade GIST, S/P resection  HISTORY OF PRESENT ILLNESS:    This is a 60 year old woman who is well known to the [facility] who is on chronic anticoagulation managed at the clinic for H/O DVTs, and Factor V Leiden who presented to the ER with sudden onset of rectal bleeding and syncopal episode.  It was determined by Dr. Karilyn Cota that small bowel capsule study was required for this patient's work-up.  The capsule study revealed: in mid small bowel seen on multiple images, this large mass involving half of the circumference with bluish discoloration and central ulceration. There is streaks of blood at the distal end, but no active bleeding noted. There is coffee-ground material distal to this mass and few clots mixed with stool in the colon.  Therefore an Abd/Pelvis CT was performed and revealed: 5.0 cm soft tissue mass associated with distal small bowel. Findings characteristic of gastrointestinal stromal tumor.    Dr. Lovell Sheehan was then consulted.  He determined that an exploratory laparotomy and partial small bowel resection was required.  This was performed and the resected tissue was sent for pathology.  Pathology reveals a GIST of intermediate grade measuring 5.8 cm, and a low mitotic rate (1). We were asked to see her in consultation.  The patient reports that she presented to the ER with a "falling out" episode at home.  She is noted to have a healing wound of the forehead.  It appears clean and healing nicely.  Steri-strips in place.  She denies any fevers, chills, night sweats.  She does admit to flatus, but no bowel movement since surgery.  She denies any headaches and double vision.  She denies any abdominal pain or  urinary complaints.   We spent time going over her GIST diagnosis.  We went over the fact that this is an intermediate grade tumor, measuring 5.8 cm and has a low mitotic rate.  She understands that due to the size of the tumor, she will need to be treated with Gleevac for 3 years to decrease the risk of recurrence.  We went over side effects, benefits, and risks of this medication.  She should tolerate the medication well.    PAST MEDICAL HISTORY:   Past Medical History  Diagnosis Date  . Vitamin B12 deficiency     vit b12 1000 mcg monthly  . Cellulitis of left leg 2006  . Ulcer 05/2009    esophageal  . Clotting disorder     heterozygosity from factor v leiden  . Pernicious anemia 07/10/2010  . DVT (deep venous thrombosis) 07/10/2010  . Factor V Leiden   . History of blood clots     lower legs  . Blood dyscrasia   . Anxiety   . Small bowel mass 01/03/2011  . Allergic urticaria 01/04/2011    Rash from tape.    ALLERGIES: Allergies  Allergen Reactions  . Cephalexin Swelling  . Dexlansoprazole Swelling  . Latex Itching  . Other Swelling    Patient states that Pecans cause her mouth to swell.  . Pantoprazole Sodium Swelling  . Penicillins Swelling  . Tape Itching  . Lovenox Itching, Swelling and Palpitations  . Nylon Rash  .  Omeprazole Itching and Rash  . Sulfonamide Derivatives Rash  . Vanilla Swelling    Lips swell with vanilla pudding      MEDICATIONS: I have reviewed the patient's current medications.     PAST SURGICAL HISTORY Past Surgical History  Procedure Date  . Abdominal hysterectomy 1989  . Balloon dilation 12/11/2010    Procedure: BALLOON DILATION;  Surgeon: Malissa Hippo, MD;  Location: [facility];  Service: Endoscopy;  Laterality: N/A;    FAMILY HISTORY: History reviewed. No pertinent family history.  SOCIAL HISTORY:  reports that she has never smoked. She does not have any smokeless tobacco history on file. She reports that she does not drink  alcohol or use illicit drugs.  PERFORMANCE STATUS: The patient's performance status is 2 - Symptomatic, <50% confined to bed  PHYSICAL EXAM: Most Recent Vital Signs: Blood pressure 107/69, pulse 78, temperature 98.4 F (36.9 C), temperature source Oral, resp. rate 18, height 5\' 7"  (1.702 m), weight 230 lb 9.6 oz (104.6 kg), SpO2 97.00%. General appearance: alert, cooperative, appears stated age and no distress Neck: supple, symmetrical, trachea midline Back: symmetric, no curvature. ROM normal. No CVA tenderness. Lungs: rhonchi bibasilar Heart: regular rate and rhythm, S1, S2 normal, no murmur, click, rub or gallop Abdomen: abnormal findings:  healing midline incision inferior to umbilicus.  Clean without discharge.  No heat or erythema.  No signs of infection. Extremities: extremities normal, atraumatic, no cyanosis or edema Pulses: 2+ and symmetric Skin: Skin color, texture, turgor normal. No rashes or lesions Neurologic: Grossly normal  LABORATORY DATA:  Results for orders placed during the hospital encounter of 01/01/11 (from the past 48 hour(s))  HEPARIN LEVEL (UNFRACTIONATED)     Status: Abnormal   Collection Time   01/07/11  3:53 PM      Component Value Range Comment   Heparin Unfractionated <0.10 (*) 0.30 - 0.70 (IU/mL)   HEPARIN LEVEL (UNFRACTIONATED)     Status: Abnormal   Collection Time   01/07/11 10:01 PM      Component Value Range Comment   Heparin Unfractionated <0.10 (*) 0.30 - 0.70 (IU/mL)   PROTIME-INR     Status: Abnormal   Collection Time   01/08/11  6:33 AM      Component Value Range Comment   Prothrombin Time 16.3 (*) 11.6 - 15.2 (seconds)    INR 1.29  0.00 - 1.49    CBC     Status: Abnormal   Collection Time   01/08/11  6:33 AM      Component Value Range Comment   WBC 6.0  4.0 - 10.5 (K/uL)    RBC 3.37 (*) 3.87 - 5.11 (MIL/uL)    Hemoglobin 9.1 (*) 12.0 - 15.0 (g/dL)    HCT 96.0 (*) 45.4 - 46.0 (%)    MCV 89.0  78.0 - 100.0 (fL)    MCH 27.0  26.0 -  34.0 (pg)    MCHC 30.3  30.0 - 36.0 (g/dL)    RDW 09.8  11.9 - 14.7 (%)    Platelets 269  150 - 400 (K/uL)   HEPARIN LEVEL (UNFRACTIONATED)     Status: Abnormal   Collection Time   01/08/11  6:33 AM      Component Value Range Comment   Heparin Unfractionated 0.76 (*) 0.30 - 0.70 (IU/mL)   HEPARIN LEVEL (UNFRACTIONATED)     Status: Abnormal   Collection Time   01/08/11  4:19 PM      Component Value Range Comment  Heparin Unfractionated 0.92 (*) 0.30 - 0.70 (IU/mL)   HEPARIN LEVEL (UNFRACTIONATED)     Status: Abnormal   Collection Time   01/08/11 10:52 PM      Component Value Range Comment   Heparin Unfractionated <0.10 (*) 0.30 - 0.70 (IU/mL)   HEPARIN LEVEL (UNFRACTIONATED)     Status: Abnormal   Collection Time   01/09/11  1:12 AM      Component Value Range Comment   Heparin Unfractionated <0.10 (*) 0.30 - 0.70 (IU/mL)   PROTIME-INR     Status: Abnormal   Collection Time   01/09/11  5:36 AM      Component Value Range Comment   Prothrombin Time 19.0 (*) 11.6 - 15.2 (seconds)    INR 1.56 (*) 0.00 - 1.49    CBC     Status: Abnormal   Collection Time   01/09/11  5:36 AM      Component Value Range Comment   WBC 5.5  4.0 - 10.5 (K/uL)    RBC 3.30 (*) 3.87 - 5.11 (MIL/uL)    Hemoglobin 9.1 (*) 12.0 - 15.0 (g/dL)    HCT 16.1 (*) 09.6 - 46.0 (%)    MCV 87.0  78.0 - 100.0 (fL)    MCH 27.6  26.0 - 34.0 (pg)    MCHC 31.7  30.0 - 36.0 (g/dL)    RDW 04.5  40.9 - 81.1 (%)    Platelets 242  150 - 400 (K/uL)       RADIOGRAPHY:  01/06/11  *RADIOLOGY REPORT*  Clinical Data: Right PICC line placement.  PORTABLE CHEST - 1 VIEW  Comparison: Chest x-ray 01/01/2011.  Findings: The right PICC line tip is in the proximal SVC. This  could be advanced 4 cm. The lungs show improved aeration.  Bibasilar atelectasis is noted.  IMPRESSION:  1. The right PICC line tip is in the proximal SVC and could be  advanced 4 cm.  2. Slight overall improved lung aeration.  Original Report  Authenticated By: P. Loralie Champagne, M.D.  01/02/11  *RADIOLOGY REPORT*  Clinical Data: Gastrointestinal bleeding  CT ABDOMEN AND PELVIS WITH CONTRAST  Technique: Multidetector CT imaging of the abdomen and pelvis was  performed following the standard protocol during bolus  administration of intravenous contrast.  Contrast: OMNIPAQUE IOHEXOL 300 MG/ML IV SOLN  Comparison: 10/29/2004  Findings: Bibasilar atelectasis or scar. Increased AP diameter of  the chest, a feature of COPD.  Hiatal hernia. Oral contrast in the esophagus.  The liver, gallbladder, spleen, pancreas, right adrenal gland  within normal limits. Stable kidneys. Stable left adrenal  adenoma.  5.0 x 4.0 x 4.2 cm soft tissue mass is associated with small bowel  in the lower abdomen. Sagittal images demonstrate central  induration. This finding is typically seen with gastrointestinal  stromal tumor. Malignant transformation is not excluded.  No free fluid. No abnormal adenopathy.  Bladder is within normal limits. Uterus is absent.  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 Variceal  collaterals extend across the inguinal regions.  Severe L5-S1 degenerative disc disease.  IMPRESSION:  5.0 cm soft tissue mass associated with distal small bowel.  Findings characteristic of gastrointestinal stromal tumor.  Adenocarcinoma or malignant transformation are not excluded.  Stable left adrenal adenoma.  Original Report Authenticated By: Donavan Burnet, M.D.    PATHOLOGY:   01/05/11 Diagnosis Small intestine, resection for tumor - GASTROINTESTINAL STROMAL TUMOR (GIST), 5.8 CM. - EIGHT BENIGN LYMPH NODES (0/8). - MARGINS NOT INVOLVED. Microscopic  Comment GASTROINTESTINAL STROMAL TUMOR (GIST) Tumor size: 5.8 cm GIST subtype: Spindle cell Mitotic rate: 1 mitoses/ 50 HPFs Grade: Intermediate Risk assessment: Intermediate Margins: Free of tumor Distance to closest margin: 4 cm Lymph- vascular invasion:  No Lymph nodes: # examined 8; # positive 0 TNM pT3, pN0 Ancillary studies: Immunohistochemical studies Preresection treatment: N/A Treatment effect: No Comment: There is a 5.8 cm tumor mass attached to the wall of the segment of small intestine. The tumor is a spindle cell neoplasm characterized by fiscals and sheets of elongated tumor cells with foci of nuclear palisading. No necrosis is identified and there is mild to moderate nuclear atypia. Mitotic count shows 1 mitosis per 50 HPF with 200 HPFs counted. There is focal hyalinized fibrosis within the tumor. Immunohistochemistry is performed for and the tumor is strongly positive with CD-117 (c-Kit) and shows weak positivity with smooth muscle actin and is negative with CD-34, Desmin, muscle specific actin and S-100 consistent with gastrointestinal stromal tumor (GIST). Case discussed with Dr. Lovell Sheehan on 01/09/11. (JDP:mw 01-08-11) Jimmy Picket MD Pathologist, Electronic Signature (Case signed 01/09/2011)  ASSESSMENT/PLAN:  1. GIST, 5.8 cm, intermediate grade, low mitotic rate (1), S/P resection by Dr. Franky Macho on 01/05/11.  S/P camera capsul study which discovered the mass with radiographic verification via CT abd/pelvis.  Will require 3 years worth of Gleevac to reduce risk of recurrence.  Will allow the patient to heal from surgery and then initiate therapy.  She has a good prognosis. 2. Factor V Leiden, on lifelong anticoagulation, managed at the [facility].  Actively undergoing bridging to Coumadin under the care of Dr. Lovell Sheehan.  Will manage when patient is out of hospital. 3. H/O DVT secondary to #2. 4. Anemia, in past from GI bleed. 5. Will continue to follow while in the hospital, 6. Has follow-up appointment with Dr. Mariel Sleet on 01/27/10.  Will keep this appointment.  Will discuss further Gleevac treatment at that time.   All questions were answered. The patient knows to call the clinic with any problems,  questions or concerns. We can certainly see the patient much sooner if necessary.  The patient and plan discussed with Glenford Peers, MD and he is in agreement with the aforementioned.  Jonda Alanis

## 2011-01-09 NOTE — Progress Notes (Signed)
CARE MANAGEMENT NOTE 01/09/2011  Patient:  [name], [name]   Account Number:  [id number]  Date Initiated:  01/06/2011  Documentation initiated by:  Andi Devon Assessment:   60 yr old female s/p colon resection  lives  with her spouse     Action/Plan:   Anticipated DC Date:  01/11/2011   Anticipated DC Plan:  HOME/SELF CARE      DC Planning Services  CM consult      Choice offered to / List presented to:             Status of service:   Medicare Important Message given?   (If response is "NO", the following Medicare IM given date fields will be blank) Date Medicare IM given:   Date Additional Medicare IM given:    Discharge Disposition:    Per UR Regulation:    Comments:  01/09/11 1130 Jalyiah Shelley Leanord Hawking RN BSN CM Spoke with pt at bedside. No HH needs identified at this time.

## 2011-01-09 NOTE — Progress Notes (Addendum)
ANTICOAGULATION CONSULT NOTE   Pharmacy Consult for Heparin Indication:  Resume chronic anticoagulation for Factor V deficiency. Bridging Heparin and Warfarin.  Allergies  Allergen Reactions  . Cephalexin Swelling  . Dexlansoprazole Swelling  . Latex Itching  . Other Swelling    Patient states that Pecans cause her mouth to swell.  . Pantoprazole Sodium Swelling  . Penicillins Swelling  . Tape Itching  . Lovenox Itching, Swelling and Palpitations  . Nylon Rash  . Omeprazole Itching and Rash  . Sulfonamide Derivatives Rash  . Vanilla Swelling    Lips swell with vanilla pudding   Patient Measurements: Height: 5\' 7"  (170.2 cm) Weight: 230 lb 9.6 oz (104.6 kg) IBW/kg (Calculated) : 61.6  Adjusted Body Weight: 87 kg Vital Signs:    Labs:  Basename 01/09/11 1900 01/09/11 1405 01/09/11 0536 01/09/11 0112 01/08/11 0633 01/07/11 0242  HGB -- -- 9.1* -- 9.1* --  HCT -- -- 28.7* -- 30.0* 26.5*  PLT -- -- 242 -- 269 241  APTT -- -- -- -- -- --  LABPROT -- -- 19.0* -- 16.3* 15.9*  INR -- -- 1.56* -- 1.29 1.24  HEPARINUNFRC 0.10* >2.00* -- <0.10* -- --  CREATININE -- -- -- -- -- 0.57  CKTOTAL -- -- -- -- -- --  CKMB -- -- -- -- -- --  TROPONINI -- -- -- -- -- --   Estimated Creatinine Clearance: 94.2 ml/min (by C-G formula based on Cr of 0.57).  Medical History: Past Medical History  Diagnosis Date  . Vitamin B12 deficiency     vit b12 1000 mcg monthly  . Cellulitis of left leg 2006  . Ulcer 05/2009    esophageal  . Clotting disorder     heterozygosity from factor v leiden  . Pernicious anemia 07/10/2010  . DVT (deep venous thrombosis) 07/10/2010  . Factor V Leiden   . History of blood clots     lower legs  . Blood dyscrasia   . Anxiety   . Small bowel mass 01/03/2011  . Allergic urticaria 01/04/2011    Rash from tape.   Medications:  Scheduled:     . ALPRAZolam  0.5 mg Oral QHS  . famotidine  20 mg Oral BID  . magnesium hydroxide  30 mL Oral 1 day or 1 dose  .  sodium chloride  10 mL Intracatheter Q12H  . warfarin  7.5 mg Oral ONCE-1800   Assessment: Heparin level was  below goal in am. Heparin increased and still below goal 6 hours later. Goal of Therapy:  Heparin level 0.3-0.7 units/ml INR 2 -3  Plan:  Change heparin rate to 16 units per kg per hour (1650 units per hour) Repeat heparin level in am CBC daily for now (platelets)    [name] J 01/09/2011,7:39 PM

## 2011-01-10 LAB — CBC
HCT: 28.5 % — ABNORMAL LOW (ref 36.0–46.0)
Hemoglobin: 9.2 g/dL — ABNORMAL LOW (ref 12.0–15.0)
MCH: 28 pg (ref 26.0–34.0)
RBC: 3.29 MIL/uL — ABNORMAL LOW (ref 3.87–5.11)

## 2011-01-10 LAB — PROTIME-INR
INR: 1.68 — ABNORMAL HIGH (ref 0.00–1.49)
Prothrombin Time: 20.1 seconds — ABNORMAL HIGH (ref 11.6–15.2)

## 2011-01-10 LAB — HEPARIN LEVEL (UNFRACTIONATED): Heparin Unfractionated: 0.2 IU/mL — ABNORMAL LOW (ref 0.30–0.70)

## 2011-01-10 MED ORDER — WARFARIN SODIUM 10 MG PO TABS
10.0000 mg | ORAL_TABLET | Freq: Once | ORAL | Status: AC
  Administered 2011-01-10: 10 mg via ORAL
  Filled 2011-01-10: qty 1

## 2011-01-10 MED ORDER — HEPARIN SOD (PORCINE) IN D5W 100 UNIT/ML IV SOLN
19.0000 [IU]/kg/h | INTRAVENOUS | Status: DC
  Administered 2011-01-10 – 2011-01-11 (×3): 19 [IU]/kg/h via INTRAVENOUS
  Filled 2011-01-10 (×2): qty 250

## 2011-01-10 NOTE — Progress Notes (Signed)
5 Days Post-Op  Subjective: Comfortable. Tolerating a diet. Normal bowel function. No complaints of issues.  Objective: Vital signs in last 24 hours: Temp:  [98 F (36.7 C)-98.7 F (37.1 C)] 98 F (36.7 C) (12/15 1450) Pulse Rate:  [77-83] 83  (12/15 1450) Resp:  [16-20] 18  (12/15 1450) BP: (115-144)/(72-90) 115/72 mmHg (12/15 1450) SpO2:  [93 %-95 %] 95 % (12/15 1450) Last BM Date: 01/09/11  Intake/Output from previous day: 12/14 0701 - 12/15 0700 In: 1042.9 [P.O.:720; I.V.:322.9] Out: 100 [Urine:100] Intake/Output this shift: Total I/O In: 600 [P.O.:600] Out: -   General appearance: no distress GI: Soft, obese, expected mild tenderness. Incisions clean dry and intact.  Lab Results:   Basename 01/10/11 0300 01/09/11 0536  WBC 5.8 5.5  HGB 9.2* 9.1*  HCT 28.5* 28.7*  PLT 265 242   BMET No results found for this basename: NA:2,K:2,CL:2,CO2:2,GLUCOSE:2,BUN:2,CREATININE:2,CALCIUM:2 in the last 72 hours PT/INR  Basename 01/10/11 0300 01/09/11 0536  LABPROT 20.1* 19.0*  INR 1.68* 1.56*   ABG No results found for this basename: PHART:2,PCO2:2,PO2:2,HCO3:2 in the last 72 hours  Studies/Results: No results found.  Anti-infectives: Anti-infectives     Start     Dose/Rate Route Frequency Ordered Stop   01/04/11 1854   vancomycin (VANCOCIN) 1,500 mg in sodium chloride 0.9 % 500 mL IVPB     Comments: To be given in Holding area.      1,500 mg 250 mL/hr over 120 Minutes Intravenous 120 min pre-op 01/04/11 1855 01/05/11 1637   01/01/11 0915   Levofloxacin (LEVAQUIN) IVPB 750 mg        750 mg 100 mL/hr over 90 Minutes Intravenous  Once 01/01/11 0909 01/01/11 1114   01/01/11 0915   vancomycin (VANCOCIN) IVPB 1000 mg/200 mL premix  Status:  Discontinued        1,000 mg 200 mL/hr over 60 Minutes Intravenous  Once 01/01/11 0909 01/01/11 1029          Assessment/Plan: s/p Procedure(s): EXPLORATORY LAPAROTOMY SMALL BOWEL RESECTION Awaiting therapeutic Coumadin  levels. Otherwise patient is doing extremely well. Patient is not a candidate for home Lovenox and therefore we'll require continued inpatient monitoring until her Coumadin levels have the computer pubic  LOS: 9 days    Timera Windt C 01/10/2011

## 2011-01-10 NOTE — Progress Notes (Signed)
ANTICOAGULATION CONSULT NOTE   Pharmacy Consult for Heparin Indication:  Resume chronic anticoagulation for Factor V deficiency. Bridging Heparin and Warfarin.  Allergies  Allergen Reactions  . Cephalexin Swelling  . Dexlansoprazole Swelling  . Latex Itching  . Other Swelling    Patient states that Pecans cause her mouth to swell.  . Pantoprazole Sodium Swelling  . Penicillins Swelling  . Tape Itching  . Lovenox Itching, Swelling and Palpitations  . Nylon Rash  . Omeprazole Itching and Rash  . Sulfonamide Derivatives Rash  . Vanilla Swelling    Lips swell with vanilla pudding   Patient Measurements: Height: 5\' 7"  (170.2 cm) Weight: 230 lb 9.6 oz (104.6 kg) IBW/kg (Calculated) : 61.6  Adjusted Body Weight: 87 kg Vital Signs: Temp: 98 F (36.7 C) (12/15 0500) Temp src: Oral (12/15 0500) BP: 144/90 mmHg (12/15 0500) Pulse Rate: 77  (12/15 0500)  Labs:  Basename 01/10/11 1211 01/10/11 0300 01/09/11 1900 01/09/11 0536 01/08/11 0633  HGB -- 9.2* -- 9.1* --  HCT -- 28.5* -- 28.7* 30.0*  PLT -- 265 -- 242 269  APTT -- -- -- -- --  LABPROT -- 20.1* -- 19.0* 16.3*  INR -- 1.68* -- 1.56* 1.29  HEPARINUNFRC 0.55 0.20* 0.10* -- --  CREATININE -- -- -- -- --  CKTOTAL -- -- -- -- --  CKMB -- -- -- -- --  TROPONINI -- -- -- -- --   Estimated Creatinine Clearance: 94.2 ml/min (by C-G formula based on Cr of 0.57).  Medical History: Past Medical History  Diagnosis Date  . Vitamin B12 deficiency     vit b12 1000 mcg monthly  . Cellulitis of left leg 2006  . Ulcer 05/2009    esophageal  . Clotting disorder     heterozygosity from factor v leiden  . Pernicious anemia 07/10/2010  . DVT (deep venous thrombosis) 07/10/2010  . Factor V Leiden   . History of blood clots     lower legs  . Blood dyscrasia   . Anxiety   . Small bowel mass 01/03/2011  . Allergic urticaria 01/04/2011    Rash from tape.   Medications:  Scheduled:     . ALPRAZolam  0.5 mg Oral QHS  . famotidine   20 mg Oral BID  . magnesium hydroxide  30 mL Oral 1 day or 1 dose  . sodium chloride  10 mL Intracatheter Q12H  . warfarin  10 mg Oral ONCE-1800  . warfarin  7.5 mg Oral ONCE-1800   Assessment: Heparin level therapeutic. INR advancing to goal Goal of Therapy:  Heparin level 0.3-0.7 units/ml INR 2 -3  Plan:  Change heparin rate to 19 units per kg per hour (1900 units per hour) Repeat heparin level in am CBC daily for now (platelets) Warfarin 10 mg PO x 1.    Breylin Dom J 01/10/2011,12:51 PM

## 2011-01-11 LAB — CBC
MCHC: 31.3 g/dL (ref 30.0–36.0)
Platelets: 282 10*3/uL (ref 150–400)
RDW: 15.3 % (ref 11.5–15.5)
WBC: 4.9 10*3/uL (ref 4.0–10.5)

## 2011-01-11 LAB — PROTIME-INR
INR: 2.28 — ABNORMAL HIGH (ref 0.00–1.49)
Prothrombin Time: 25.5 seconds — ABNORMAL HIGH (ref 11.6–15.2)

## 2011-01-11 LAB — HEPARIN LEVEL (UNFRACTIONATED): Heparin Unfractionated: 0.73 IU/mL — ABNORMAL HIGH (ref 0.30–0.70)

## 2011-01-11 MED ORDER — HEPARIN SOD (PORCINE) IN D5W 100 UNIT/ML IV SOLN: 16.0000 [IU]/kg/h | INTRAVENOUS | Status: DC

## 2011-01-11 MED ORDER — WARFARIN SODIUM 5 MG PO TABS: 5.0000 mg | ORAL_TABLET | Freq: Once | ORAL | Status: DC

## 2011-01-11 MED ORDER — HYDROCODONE-ACETAMINOPHEN 5-325 MG PO TABS: 1.0000 | ORAL_TABLET | ORAL | Status: AC | PRN

## 2011-01-11 NOTE — Progress Notes (Signed)
Discharge instructions and prescriptions given, verbalized understanding, PICC line removed, pressure held x 3 minutes, vaseline gauze and 4x4's applied with hypafix tape, instructed patient to leave dressing in place x 24 hours and not to get dressing wet, verbalized understanding, out via w/c in stable condition with staff.

## 2011-01-11 NOTE — Discharge Summary (Signed)
Physician Discharge Summary  Patient ID: [name] MRN: [id number] DOB/AGE: 1950-10-11 60 y.o.  Admit date: 01/01/2011 Discharge date: 01/11/2011  Admission Diagnoses: GI bleed  Discharge Diagnoses: GIST tumor of small bowel Principal Problem:  *GI BLEEDING Active Problems:  ANEMIA, IRON DEFICIENCY, CHRONIC  Reflux esophagitis  DVT (deep venous thrombosis)  Pernicious anemia  Factor V Leiden  Syncope  Acute blood loss anemia  Laceration of forehead  Chronic ulcer of left leg  Chronic anticoagulation  Abnormal CXR  Lip swelling  Small bowel mass  Allergic urticaria   Discharged Condition: stable  Hospital Course: Patient was admitted for continued lower GI bleeding. Capsule endoscopy demonstrated a mass within the small bowel and patient was taken to the operating room per Dr. Lovell Sheehan for resection. She tolerated this well. Her recovery is active and unremarkable other than awaiting for her Coumadin levels to the return to therapeutic doses. She is tolerating a regular diet. She's been amber toward. And today her Coumadin levels have increased to a therapeutic INR.  Consults: Pharmacy  Significant Diagnostic Studies: Serial blood draws  Treatments: surgery: Exploratory laparotomy and small bowel resection of gist tumor  Discharge Exam: Blood pressure 132/73, pulse 70, temperature 97.8 F (36.6 C), temperature source Oral, resp. rate 18, height 5\' 7"  (1.702 m), weight 104.6 kg (230 lb 9.6 oz), SpO2 90.00%. General appearance: alert and no distress Resp: clear to auscultation bilaterally Cardio: regular rate and rhythm GI: Positive bowel sounds, soft, obese, mild expected tenderness. No diffuse peritoneal signs. Incision is clean dry and intact.  Disposition: Home or Self Care  Discharge Orders    Future Appointments: Provider: Department: Dept Phone: Center:   01/23/2011 9:40 AM [facility] [phone] None   01/28/2011 9:00 AM Randall An,  MD [facility] [phone] None   03/17/2011 10:45 AM Malissa Hippo, MD [facility] [phone] None     Future Orders Please Complete By Expires   Diet - low sodium heart healthy      Increase activity slowly      Discharge instructions      Comments:   Increase activity as tolerated.   Driving Restrictions      Comments:   No driving while on pain medications.    Lifting restrictions      Comments:   No lifting over 20lbs for 4-5 weeks post-op.    Discharge wound care:      Comments:   Soap and water.  No soaking.   Call MD for:  temperature >100.4      Call MD for:  persistant nausea and vomiting      Call MD for:  severe uncontrolled pain      Call MD for:  redness, tenderness, or signs of infection (pain, swelling, redness, odor or green/yellow discharge around incision site)        Current Discharge Medication List    START taking these medications   Details  HYDROcodone-acetaminophen (NORCO) 5-325 MG per tablet Take 1-2 tablets by mouth every 4 (four) hours as needed. Qty: 35 tablet, Refills: 0      CONTINUE these medications which have NOT CHANGED   Details  acetaminophen (TYLENOL) 325 MG tablet Take 650 mg by mouth every 6 (six) hours as needed. Pain    ALPRAZolam (XANAX) 0.5 MG tablet Take 0.5 mg by mouth daily.     CYANOCOBALAMIN IJ Inject 1,000 mcg as directed every 30 (thirty) days.      diphenhydrAMINE (BENADRYL)  25 mg capsule Take 25 mg by mouth every 6 (six) hours as needed. Itching or allergies    folic acid (FOLVITE) 1 MG tablet Take 1 mg by mouth daily.      iron polysaccharides (NIFEREX) 150 MG capsule Take 150 mg by mouth every morning.     !! warfarin (COUMADIN) 2 MG tablet Take 2 mg by mouth daily. Takes along with the 5 mg to create 7 mg daily.    !! warfarin (COUMADIN) 5 MG tablet Take 5 mg by mouth daily. Takes along with the 2 mg to create 7 mg.     !! - Potential duplicate medications found. Please discuss with  provider.    STOP taking these medications     RABEprazole (ACIPHEX) 20 MG tablet        Follow-up Information    Follow up with Randall An, MD in 1 week.   Contact information:   [address] [place] [address] [phone]       Follow up with Franky Macho A on 01/15/2011.   Contact information:   [address] [place] [address] [phone]          Signed: Fabio Bering 01/11/2011, 1:41 PM

## 2011-01-11 NOTE — Progress Notes (Signed)
ANTICOAGULATION CONSULT NOTE   Pharmacy Consult for Heparin and Warfarin Indication:  Resume chronic anticoagulation for Factor V deficiency. Bridging Heparin and Warfarin.  Allergies  Allergen Reactions  . Cephalexin Swelling  . Dexlansoprazole Swelling  . Latex Itching  . Other Swelling    Patient states that Pecans cause her mouth to swell.  . Pantoprazole Sodium Swelling  . Penicillins Swelling  . Tape Itching  . Lovenox Itching, Swelling and Palpitations  . Nylon Rash  . Omeprazole Itching and Rash  . Sulfonamide Derivatives Rash  . Vanilla Swelling    Lips swell with vanilla pudding   Patient Measurements: Height: 5\' 7"  (170.2 cm) Weight: 230 lb 9.6 oz (104.6 kg) IBW/kg (Calculated) : 61.6  Adjusted Body Weight: 87 kg Vital Signs: Temp: 97.8 F (36.6 C) (12/16 0509) Temp src: Oral (12/16 0509) BP: 132/73 mmHg (12/16 0509) Pulse Rate: 70  (12/16 0509)  Labs:  Basename 01/11/11 0630 01/11/11 0626 01/10/11 1211 01/10/11 0300 01/09/11 0536  HGB -- 9.2* -- 9.2* --  HCT -- 29.4* -- 28.5* 28.7*  PLT -- 282 -- 265 242  APTT -- -- -- -- --  LABPROT -- 25.5* -- 20.1* 19.0*  INR -- 2.28* -- 1.68* 1.56*  HEPARINUNFRC 0.73* -- 0.55 0.20* --  CREATININE -- -- -- -- --  CKTOTAL -- -- -- -- --  CKMB -- -- -- -- --  TROPONINI -- -- -- -- --   Estimated Creatinine Clearance: 94.2 ml/min (by C-G formula based on Cr of 0.57).  Medical History: Past Medical History  Diagnosis Date  . Vitamin B12 deficiency     vit b12 1000 mcg monthly  . Cellulitis of left leg 2006  . Ulcer 05/2009    esophageal  . Clotting disorder     heterozygosity from factor v leiden  . Pernicious anemia 07/10/2010  . DVT (deep venous thrombosis) 07/10/2010  . Factor V Leiden   . History of blood clots     lower legs  . Blood dyscrasia   . Anxiety   . Small bowel mass 01/03/2011  . Allergic urticaria 01/04/2011    Rash from tape.   Medications:  Scheduled:     . ALPRAZolam  0.5 mg Oral QHS   . famotidine  20 mg Oral BID  . magnesium hydroxide  30 mL Oral 1 day or 1 dose  . sodium chloride  10 mL Intracatheter Q12H  . warfarin  10 mg Oral ONCE-1800  . warfarin  5 mg Oral ONCE-1800   Assessment: Heparin level slightly supra- therapeutic. INR at goal Goal of Therapy:  Heparin level 0.3-0.7 units/ml INR 2 -3  Plan:  Change heparin rate to 16 units per kg per hour (1650 units per hour) Repeat heparin level in am. DC Heparin today? CBC daily for now (platelets) Warfarin 5 mg PO x 1.    Jeimy Bickert J 01/11/2011,12:19 PM

## 2011-01-12 ENCOUNTER — Encounter (HOSPITAL_COMMUNITY): Payer: Self-pay | Admitting: General Surgery

## 2011-01-13 ENCOUNTER — Ambulatory Visit (HOSPITAL_COMMUNITY): Payer: Medicare Other | Admitting: Oncology

## 2011-01-14 ENCOUNTER — Encounter (HOSPITAL_COMMUNITY): Payer: Self-pay | Admitting: Internal Medicine

## 2011-01-15 ENCOUNTER — Other Ambulatory Visit (HOSPITAL_COMMUNITY): Payer: Self-pay | Admitting: Oncology

## 2011-01-15 ENCOUNTER — Encounter (HOSPITAL_COMMUNITY): Payer: Medicare Other | Attending: Internal Medicine

## 2011-01-15 DIAGNOSIS — D682 Hereditary deficiency of other clotting factors: Secondary | ICD-10-CM | POA: Insufficient documentation

## 2011-01-15 DIAGNOSIS — I82409 Acute embolism and thrombosis of unspecified deep veins of unspecified lower extremity: Secondary | ICD-10-CM

## 2011-01-15 DIAGNOSIS — D6851 Activated protein C resistance: Secondary | ICD-10-CM

## 2011-01-15 DIAGNOSIS — D6859 Other primary thrombophilia: Secondary | ICD-10-CM | POA: Insufficient documentation

## 2011-01-15 LAB — PROTIME-INR
INR: 2.21 — ABNORMAL HIGH (ref 0.00–1.49)
Prothrombin Time: 24.9 seconds — ABNORMAL HIGH (ref 11.6–15.2)

## 2011-01-15 NOTE — Progress Notes (Signed)
Labs drawn today for pt.  Patient on 7mg of coud.  Call patient at [phone]. 

## 2011-01-22 ENCOUNTER — Encounter (HOSPITAL_BASED_OUTPATIENT_CLINIC_OR_DEPARTMENT_OTHER): Payer: Medicare Other

## 2011-01-22 ENCOUNTER — Other Ambulatory Visit (HOSPITAL_COMMUNITY): Payer: Self-pay | Admitting: Oncology

## 2011-01-22 DIAGNOSIS — I82409 Acute embolism and thrombosis of unspecified deep veins of unspecified lower extremity: Secondary | ICD-10-CM

## 2011-01-22 DIAGNOSIS — D682 Hereditary deficiency of other clotting factors: Secondary | ICD-10-CM

## 2011-01-22 DIAGNOSIS — D6851 Activated protein C resistance: Secondary | ICD-10-CM

## 2011-01-22 DIAGNOSIS — D6859 Other primary thrombophilia: Secondary | ICD-10-CM

## 2011-01-22 NOTE — Progress Notes (Signed)
Pt notified to continue same dose and pt to return jan 2

## 2011-01-22 NOTE — Progress Notes (Signed)
Labs drawn today for pt.  Patient on 7mg  on coud.  Call patient at [phone]

## 2011-01-23 ENCOUNTER — Other Ambulatory Visit (HOSPITAL_COMMUNITY): Payer: Medicare Other

## 2011-01-28 ENCOUNTER — Other Ambulatory Visit (HOSPITAL_COMMUNITY): Payer: Self-pay | Admitting: Oncology

## 2011-01-28 ENCOUNTER — Telehealth (HOSPITAL_COMMUNITY): Payer: Self-pay

## 2011-01-28 ENCOUNTER — Encounter (HOSPITAL_COMMUNITY): Payer: Medicare Other | Attending: Internal Medicine | Admitting: Oncology

## 2011-01-28 ENCOUNTER — Encounter (HOSPITAL_COMMUNITY): Payer: Medicare Other

## 2011-01-28 DIAGNOSIS — I82409 Acute embolism and thrombosis of unspecified deep veins of unspecified lower extremity: Secondary | ICD-10-CM | POA: Insufficient documentation

## 2011-01-28 DIAGNOSIS — C179 Malignant neoplasm of small intestine, unspecified: Secondary | ICD-10-CM

## 2011-01-28 DIAGNOSIS — D6851 Activated protein C resistance: Secondary | ICD-10-CM

## 2011-01-28 DIAGNOSIS — D6859 Other primary thrombophilia: Secondary | ICD-10-CM | POA: Insufficient documentation

## 2011-01-28 DIAGNOSIS — C494 Malignant neoplasm of connective and soft tissue of abdomen: Secondary | ICD-10-CM | POA: Insufficient documentation

## 2011-01-28 DIAGNOSIS — Z7901 Long term (current) use of anticoagulants: Secondary | ICD-10-CM

## 2011-01-28 DIAGNOSIS — Z86718 Personal history of other venous thrombosis and embolism: Secondary | ICD-10-CM

## 2011-01-28 DIAGNOSIS — C49A0 Gastrointestinal stromal tumor, unspecified site: Secondary | ICD-10-CM

## 2011-01-28 LAB — CBC
HCT: 33.5 % — ABNORMAL LOW (ref 36.0–46.0)
RBC: 4.07 MIL/uL (ref 3.87–5.11)
RDW: 15.4 % (ref 11.5–15.5)
WBC: 5.5 10*3/uL (ref 4.0–10.5)

## 2011-01-28 MED ORDER — IMATINIB MESYLATE 400 MG PO TABS: 400.0000 mg | ORAL_TABLET | Freq: Every day | ORAL | Status: DC

## 2011-01-28 NOTE — Progress Notes (Signed)
Labs drawn today for pt.  Patient on 7mg  on coud.  Call patient [phone]

## 2011-01-28 NOTE — Progress Notes (Signed)
DIAGNOSES: 1. New onset of a gastrointestinal stromal tumor of the small bowel     status, post resection by Dr. Franky Macho on January 06, 2011.     This was a 5.8 cm cancer, 8 nodes were negative, margins were     clear, and she had only 1 mitosis per 50 high-powered fields, but     it was felt to be an intermediate-grade tumor.  This was a pT3 pN0     lesion and because of the intermediate grade and the size, she is a     candidate for adjuvant Gleevec.  Her CT scan prior to her surgery     which revealed the mass did not reveal distant metastatic disease. 2. History of multiple deep vein thromboses, for which she is on     lifelong Coumadin.  She has factor V Leiden heterozygosity. 3. History of well-documented pernicious anemia, on B12 replacement     therapy. 4. History of cellulitis of the left leg with chronic leg changes and     some disability because of that. 5. Allergic urticaria from tape. 6. Chronic anxiety and mild depression over the loss of 1 of her sons. This is a very pleasant lady whose pathology of this GIST really is in need of adjuvant Gleevec.  She, I think, still carries a good prognosis,and I suspect she has had some of this symptomatology from this disease off and on for several years since she started having some problems with blacking-out spells since then anemia even in 2006.  She had had an EGD and colonoscopy in the past which was negative but the CT scan was done with her last episode of collapse and revealed the above mass.  We will see what a CBC shows today, but I have gone over the side effects to Gleevec with her and we are going to start that at 400 mg a day and at least continue that for 3 years.  If she cannot tolerate, we will have to find another drug that she can such as nilotinib.  She looks good today.  Her incision is well-healed.  Vital signs are very stable.  We will see her back in 8 weeks to see how she is tolerating the drug, and  will see what her INR is today.  It is not ready yet.  She is willing to proceed.  Her PT just came back and her INR is 2.28, so she is very therapeutic and she will stay on the same dose.    ______________________________ Ladona Horns. Mariel Sleet, MD ESN/MEDQ  D:  01/28/2011  T:  01/28/2011  Job:  [id number]

## 2011-01-28 NOTE — Telephone Encounter (Signed)
Patient called to inform of lab results and instructions on Coumadin dosage and follow up labs.  Patient verbalized understanding.

## 2011-01-28 NOTE — Patient Instructions (Signed)
[facility]  Discharge Instructions  RECOMMENDATIONS MADE BY THE CONSULTANT AND ANY TEST RESULTS WILL BE SENT TO YOUR REFERRING DOCTOR.   EXAM FINDINGS BY MD TODAY AND SIGNS AND SYMPTOMS TO REPORT TO CLINIC OR PRIMARY MD: Exam/discussion per MD. Gleevec has been ordered at your pharmacy. You are to start [facility] tomorrow. Return to clinic in 8 weeks to see MD. Return to clinic as scheduled for PT/INR checks.   I acknowledge that I have been informed and understand all the instructions given to me and received a copy. I do not have any more questions at this time, but understand that I may call the Specialty Clinic at [facility] at [phone] during business hours should I have any further questions or need assistance in obtaining follow-up care.    __________________________________________  _____________  __________ Signature of Patient or Authorized Representative            Date                   Time    __________________________________________ Nurse's Signature

## 2011-01-29 ENCOUNTER — Encounter (HOSPITAL_COMMUNITY): Payer: Self-pay | Admitting: Oncology

## 2011-02-03 ENCOUNTER — Telehealth (HOSPITAL_COMMUNITY): Payer: Self-pay | Admitting: Oncology

## 2011-02-04 ENCOUNTER — Other Ambulatory Visit (HOSPITAL_COMMUNITY): Payer: Medicare Other

## 2011-02-09 NOTE — Telephone Encounter (Signed)
i don't know why this encounter is asking for additional notes.

## 2011-02-11 ENCOUNTER — Telehealth (HOSPITAL_COMMUNITY): Payer: Self-pay | Admitting: *Deleted

## 2011-02-11 ENCOUNTER — Encounter (HOSPITAL_COMMUNITY): Payer: Medicare Other

## 2011-02-11 ENCOUNTER — Other Ambulatory Visit (HOSPITAL_COMMUNITY): Payer: Self-pay | Admitting: Oncology

## 2011-02-11 ENCOUNTER — Other Ambulatory Visit (HOSPITAL_COMMUNITY): Payer: Medicare Other

## 2011-02-11 DIAGNOSIS — D6851 Activated protein C resistance: Secondary | ICD-10-CM

## 2011-02-11 LAB — PROTIME-INR: INR: 1.8 — ABNORMAL HIGH (ref 0.00–1.49)

## 2011-02-11 NOTE — Telephone Encounter (Signed)
Message copied by Dennie Maizes on Wed Feb 11, 2011  1:27 PM ------      Message from: Ellouise Newer III      Created: Wed Feb 11, 2011 12:53 PM       Same dose            PT/INR in 1 weeks

## 2011-02-11 NOTE — Progress Notes (Signed)
Labs drawn today for pt.  Patient on 7mg of coud.  Call patient at [phone]. 

## 2011-02-11 NOTE — Telephone Encounter (Signed)
Message left on answering machine to continue Coumadin 7 mg daily and RTC in 1 week for labs.

## 2011-02-18 ENCOUNTER — Other Ambulatory Visit (HOSPITAL_COMMUNITY): Payer: Medicare Other

## 2011-02-20 ENCOUNTER — Encounter (HOSPITAL_BASED_OUTPATIENT_CLINIC_OR_DEPARTMENT_OTHER): Payer: Medicare Other

## 2011-02-20 DIAGNOSIS — Z86718 Personal history of other venous thrombosis and embolism: Secondary | ICD-10-CM

## 2011-02-20 DIAGNOSIS — D6851 Activated protein C resistance: Secondary | ICD-10-CM

## 2011-02-20 LAB — PROTIME-INR: INR: 1.74 — ABNORMAL HIGH (ref 0.00–1.49)

## 2011-02-20 NOTE — Progress Notes (Signed)
Labs drawn today for pt.  Patient on 7mg of coud.  Call patient at [phone]. 

## 2011-02-23 ENCOUNTER — Other Ambulatory Visit (HOSPITAL_COMMUNITY): Payer: Medicare Other

## 2011-02-25 ENCOUNTER — Encounter (HOSPITAL_BASED_OUTPATIENT_CLINIC_OR_DEPARTMENT_OTHER): Payer: Medicare Other

## 2011-02-25 DIAGNOSIS — I82409 Acute embolism and thrombosis of unspecified deep veins of unspecified lower extremity: Secondary | ICD-10-CM

## 2011-02-25 DIAGNOSIS — Z86718 Personal history of other venous thrombosis and embolism: Secondary | ICD-10-CM

## 2011-02-25 DIAGNOSIS — Z7901 Long term (current) use of anticoagulants: Secondary | ICD-10-CM

## 2011-02-25 NOTE — Progress Notes (Signed)
Labs drawn today for pt.  Patient on 7mg  of coud.  Call patient at [phone].  Patient on glevex.  Patien to come back on Mon 4th per Neijstrom

## 2011-02-26 ENCOUNTER — Telehealth (HOSPITAL_COMMUNITY): Payer: Self-pay | Admitting: *Deleted

## 2011-02-26 NOTE — Telephone Encounter (Signed)
Message copied by Dennie Maizes on Thu Feb 26, 2011  2:18 PM ------      Message from: Mariel Sleet, ERIC S      Created: Wed Feb 25, 2011 10:38 AM       Verify her dose-it is a low INR.

## 2011-02-26 NOTE — Telephone Encounter (Signed)
Left message with pt's husband for her to call us with current dose of Coumadin.

## 2011-02-27 ENCOUNTER — Other Ambulatory Visit (HOSPITAL_COMMUNITY): Payer: Medicare Other

## 2011-02-27 NOTE — Progress Notes (Signed)
Addended by: Corena Herter D on: 02/27/2011 12:48 PM   Modules accepted: Orders

## 2011-02-27 NOTE — Progress Notes (Signed)
Per Dr. Mariel Sleet, begin taking coumadin 7mg  alternating with 8mg .  Ladona Mow called and advised of same; patient verbalized understanding of dosing regimen.  No questions at this time per patient's report.  She is scheduled for repeat labs this upcoming week.

## 2011-03-02 ENCOUNTER — Other Ambulatory Visit (HOSPITAL_COMMUNITY): Payer: Medicare Other

## 2011-03-03 ENCOUNTER — Other Ambulatory Visit (HOSPITAL_COMMUNITY): Payer: Self-pay | Admitting: Oncology

## 2011-03-03 ENCOUNTER — Encounter (HOSPITAL_COMMUNITY): Payer: Medicare Other | Attending: Internal Medicine

## 2011-03-03 DIAGNOSIS — Z86718 Personal history of other venous thrombosis and embolism: Secondary | ICD-10-CM

## 2011-03-03 DIAGNOSIS — D6851 Activated protein C resistance: Secondary | ICD-10-CM

## 2011-03-03 DIAGNOSIS — D6859 Other primary thrombophilia: Secondary | ICD-10-CM | POA: Insufficient documentation

## 2011-03-03 DIAGNOSIS — Z7901 Long term (current) use of anticoagulants: Secondary | ICD-10-CM

## 2011-03-03 DIAGNOSIS — I82409 Acute embolism and thrombosis of unspecified deep veins of unspecified lower extremity: Secondary | ICD-10-CM

## 2011-03-03 DIAGNOSIS — R000 Tachycardia, unspecified: Secondary | ICD-10-CM | POA: Insufficient documentation

## 2011-03-03 DIAGNOSIS — D682 Hereditary deficiency of other clotting factors: Secondary | ICD-10-CM | POA: Insufficient documentation

## 2011-03-03 DIAGNOSIS — R231 Pallor: Secondary | ICD-10-CM | POA: Insufficient documentation

## 2011-03-03 DIAGNOSIS — D509 Iron deficiency anemia, unspecified: Secondary | ICD-10-CM | POA: Insufficient documentation

## 2011-03-03 LAB — PROTIME-INR
INR: 2.16 — ABNORMAL HIGH (ref 0.00–1.49)
Prothrombin Time: 24.5 seconds — ABNORMAL HIGH (ref 11.6–15.2)

## 2011-03-03 NOTE — Progress Notes (Signed)
Labs drawn today for pt.  Patient on 7 and 8 mg alternating.  Call patient at [phone] 

## 2011-03-06 ENCOUNTER — Other Ambulatory Visit (HOSPITAL_COMMUNITY): Payer: Medicare Other

## 2011-03-06 ENCOUNTER — Ambulatory Visit (HOSPITAL_COMMUNITY): Payer: Medicare Other

## 2011-03-13 ENCOUNTER — Encounter (HOSPITAL_BASED_OUTPATIENT_CLINIC_OR_DEPARTMENT_OTHER): Payer: Medicare Other

## 2011-03-13 ENCOUNTER — Ambulatory Visit (HOSPITAL_COMMUNITY)
Admission: RE | Admit: 2011-03-13 | Discharge: 2011-03-13 | Disposition: A | Discharge status: Home / Self Care | Payer: Medicare Other | Source: Ambulatory Visit | Attending: Oncology | Admitting: Oncology

## 2011-03-13 ENCOUNTER — Other Ambulatory Visit (HOSPITAL_COMMUNITY): Payer: Self-pay | Admitting: Oncology

## 2011-03-13 DIAGNOSIS — D6851 Activated protein C resistance: Secondary | ICD-10-CM

## 2011-03-13 DIAGNOSIS — I82409 Acute embolism and thrombosis of unspecified deep veins of unspecified lower extremity: Secondary | ICD-10-CM

## 2011-03-13 DIAGNOSIS — D682 Hereditary deficiency of other clotting factors: Secondary | ICD-10-CM

## 2011-03-13 DIAGNOSIS — M7989 Other specified soft tissue disorders: Secondary | ICD-10-CM

## 2011-03-13 DIAGNOSIS — Z86718 Personal history of other venous thrombosis and embolism: Secondary | ICD-10-CM

## 2011-03-13 DIAGNOSIS — I824Y9 Acute embolism and thrombosis of unspecified deep veins of unspecified proximal lower extremity: Secondary | ICD-10-CM | POA: Insufficient documentation

## 2011-03-13 LAB — PROTIME-INR: INR: 2.22 — ABNORMAL HIGH (ref 0.00–1.49)

## 2011-03-13 MED ORDER — HYDROCODONE-ACETAMINOPHEN 5-325 MG PO TABS: 1.0000 | ORAL_TABLET | Freq: Four times a day (QID) | ORAL | Status: AC | PRN

## 2011-03-13 MED ORDER — FONDAPARINUX SODIUM 10 MG/0.8ML ~~LOC~~ SOLN
10.0000 mg | Freq: Once | SUBCUTANEOUS | Status: AC
  Administered 2011-03-13: 10 mg via SUBCUTANEOUS
  Filled 2011-03-13: qty 0.8

## 2011-03-13 MED ORDER — FONDAPARINUX SODIUM 10 MG/0.8ML ~~LOC~~ SOLN: 10.0000 mg | SUBCUTANEOUS | Status: AC

## 2011-03-13 NOTE — Progress Notes (Signed)
Ultrasound of left LE performed revealing a blood clot.  03/13/2011 *RADIOLOGY REPORT*  Clinical Data: Left leg swelling, rule out DVT  LEFT LOWER EXTREMITY VENOUS DUPLEX ULTRASOUND  Technique: Gray-scale sonography with graded compression, as well  as color Doppler and duplex ultrasound, were performed to evaluate  the deep venous system of the lower extremity from the level of the  common femoral vein through the popliteal and proximal calf veins.  Spectral Doppler was utilized to evaluate flow at rest and with  distal augmentation maneuvers.  Comparison: 10/27/2004  Findings: Ultrasound examination of the left lower extremity was  performed. Nonobstructive thrombus noted in the left common  femoral vein.  There is a obstructive thrombus in the femoral vein extending in  popliteal vein, posterior tibial vein and peroneal vein with  decreased compressibility. Findings are consistent with deep vein  thrombosis.  IMPRESSION:  Deep vein thrombosis with occlusive thrombus femoral vein extending  in the left popliteal left peroneal and left posterior tibial vein.  Nonocclusive thrombus in the left common femoral vein.  This was made a call report.  Original Report Authenticated By: Natasha Mead, M.D.   Reviewing her INRs, she was minimally sub therapeutic 02/11/2011- 02/25/2011.  She reported this swelling and discomfort about 2 weeks ago.  This correlates with being sub therapeutic.   Will not call this a Coumadin failure.    Will give 10 mg Arixtra daily SQ x 10 days.  Continue Coumadin at same dose. PT/INR in 1 week.   Woodroe Vogan

## 2011-03-13 NOTE — Progress Notes (Signed)
Patient seen in the phlebotomist office getting lab work done.  She reported unilateral LE swelling.  I was asked to see the patient.  Her left LE is slightly edematous with 1+ LE edema and is slightly warm to the touch when compared to the right LE.  She reports that it is painful when standing.  She reports that it burns.   We will get a left LE [place] to evaluate for a DVT.  [name]

## 2011-03-13 NOTE — Progress Notes (Signed)
[name] presents today for injection per MD orders. arixtra 10 mg administered SQ in right Abdomen. Administration without incident. Patient tolerated well.  

## 2011-03-13 NOTE — Progress Notes (Signed)
Labs drawn today for pt.  Patient on 7 and 8 mg alternating.  Call patient at [phone]

## 2011-03-14 ENCOUNTER — Encounter (HOSPITAL_BASED_OUTPATIENT_CLINIC_OR_DEPARTMENT_OTHER): Payer: Medicare Other

## 2011-03-14 DIAGNOSIS — D682 Hereditary deficiency of other clotting factors: Secondary | ICD-10-CM

## 2011-03-14 DIAGNOSIS — I82409 Acute embolism and thrombosis of unspecified deep veins of unspecified lower extremity: Secondary | ICD-10-CM

## 2011-03-14 DIAGNOSIS — Z86718 Personal history of other venous thrombosis and embolism: Secondary | ICD-10-CM

## 2011-03-14 MED ORDER — FONDAPARINUX SODIUM 10 MG/0.8ML ~~LOC~~ SOLN
10.0000 mg | Freq: Once | SUBCUTANEOUS | Status: AC
  Administered 2011-03-14: 10 mg via SUBCUTANEOUS
  Filled 2011-03-14: qty 0.8

## 2011-03-14 NOTE — Progress Notes (Signed)
[name] presents today for injection per MD orders. Arixtra 10mg administered SQ in left Abdomen. Administration without incident. Patient tolerated well.  

## 2011-03-15 ENCOUNTER — Ambulatory Visit (HOSPITAL_COMMUNITY): Payer: Medicare Other

## 2011-03-15 ENCOUNTER — Encounter (HOSPITAL_BASED_OUTPATIENT_CLINIC_OR_DEPARTMENT_OTHER): Payer: Medicare Other

## 2011-03-15 VITALS — BP 156/90 | HR 77 | Temp 97.6°F

## 2011-03-15 DIAGNOSIS — I82409 Acute embolism and thrombosis of unspecified deep veins of unspecified lower extremity: Secondary | ICD-10-CM

## 2011-03-15 DIAGNOSIS — D682 Hereditary deficiency of other clotting factors: Secondary | ICD-10-CM

## 2011-03-15 DIAGNOSIS — Z86718 Personal history of other venous thrombosis and embolism: Secondary | ICD-10-CM

## 2011-03-15 MED ORDER — FONDAPARINUX SODIUM 10 MG/0.8ML ~~LOC~~ SOLN
10.0000 mg | Freq: Once | SUBCUTANEOUS | Status: AC
  Administered 2011-03-15: 10 mg via SUBCUTANEOUS

## 2011-03-15 NOTE — Progress Notes (Signed)
[name] presents today for injection per the provider's orders.  Arixtra 10mg  administered SQ in right Abdomen.  Administration without incident.  Patient tolerated well and reports no questions or complaints at this time.

## 2011-03-16 ENCOUNTER — Encounter (HOSPITAL_BASED_OUTPATIENT_CLINIC_OR_DEPARTMENT_OTHER): Payer: Medicare Other

## 2011-03-16 ENCOUNTER — Ambulatory Visit (HOSPITAL_COMMUNITY): Payer: Medicare Other

## 2011-03-16 VITALS — BP 121/75 | HR 83 | Temp 97.6°F

## 2011-03-16 DIAGNOSIS — D682 Hereditary deficiency of other clotting factors: Secondary | ICD-10-CM

## 2011-03-16 DIAGNOSIS — I82409 Acute embolism and thrombosis of unspecified deep veins of unspecified lower extremity: Secondary | ICD-10-CM

## 2011-03-16 DIAGNOSIS — Z86718 Personal history of other venous thrombosis and embolism: Secondary | ICD-10-CM

## 2011-03-16 MED ORDER — FONDAPARINUX SODIUM 10 MG/0.8ML ~~LOC~~ SOLN
10.0000 mg | Freq: Once | SUBCUTANEOUS | Status: AC
  Administered 2011-03-16: 10 mg via SUBCUTANEOUS
  Filled 2011-03-16: qty 0.8

## 2011-03-16 NOTE — Progress Notes (Signed)
[name] presents today for injection per the provider's orders.  Arixtra 10mg  administered SQ in left Abdomen.  Administration without incident.  Patient tolerated well and reports no questions or complaints at this time.

## 2011-03-17 ENCOUNTER — Ambulatory Visit (INDEPENDENT_AMBULATORY_CARE_PROVIDER_SITE_OTHER): Payer: Medicare Other | Admitting: Internal Medicine

## 2011-03-17 ENCOUNTER — Encounter (HOSPITAL_BASED_OUTPATIENT_CLINIC_OR_DEPARTMENT_OTHER): Payer: Medicare Other

## 2011-03-17 ENCOUNTER — Other Ambulatory Visit (HOSPITAL_COMMUNITY): Payer: Self-pay | Admitting: Oncology

## 2011-03-17 ENCOUNTER — Encounter (INDEPENDENT_AMBULATORY_CARE_PROVIDER_SITE_OTHER): Payer: Self-pay | Admitting: Internal Medicine

## 2011-03-17 VITALS — BP 130/80 | HR 82 | Temp 97.4°F | Resp 14 | Ht 67.0 in | Wt 230.0 lb

## 2011-03-17 VITALS — BP 133/78 | HR 77 | Temp 97.1°F

## 2011-03-17 DIAGNOSIS — Z86718 Personal history of other venous thrombosis and embolism: Secondary | ICD-10-CM

## 2011-03-17 DIAGNOSIS — D509 Iron deficiency anemia, unspecified: Secondary | ICD-10-CM

## 2011-03-17 DIAGNOSIS — K219 Gastro-esophageal reflux disease without esophagitis: Secondary | ICD-10-CM

## 2011-03-17 DIAGNOSIS — D682 Hereditary deficiency of other clotting factors: Secondary | ICD-10-CM

## 2011-03-17 DIAGNOSIS — I82409 Acute embolism and thrombosis of unspecified deep veins of unspecified lower extremity: Secondary | ICD-10-CM

## 2011-03-17 MED ORDER — LANSOPRAZOLE 15 MG PO CPDR: 15.0000 mg | DELAYED_RELEASE_CAPSULE | Freq: Every day | ORAL | Status: DC

## 2011-03-17 MED ORDER — FONDAPARINUX SODIUM 10 MG/0.8ML ~~LOC~~ SOLN
10.0000 mg | Freq: Once | SUBCUTANEOUS | Status: AC
  Administered 2011-03-17: 10 mg via SUBCUTANEOUS
  Filled 2011-03-17: qty 0.8

## 2011-03-17 NOTE — Progress Notes (Signed)
Tolerated injection well. 

## 2011-03-17 NOTE — Progress Notes (Signed)
Presenting complaint; Followup for GERD and iron deficiency anemia. Subjective: [name] is 61 year old Caucasian female who has history of ulcerative reflux esophagitis. About 9 weeks ago she was admitted with acute GI bleed and found to have small bowel tumor. She had surgery with removal of 5.8 cm size just and 0/8 lymph nodes were negative. She she was begun gleevec been experiencing side effects. She experiences nausea and chills. At times she wakes up in the middle of night with feeling of food in her esophagus. She is having heartburn at least every other night. She denies vomiting or dysphagia. In spite of these symptoms she feels a lot better. She states her left leg ulcer has healed completely. She had this ulcer for 2 years. Her stools are dark because she takes iron. She denies melena or rectal bleeding. She was recently found to have DVT in her left leg in presently on Arixtra and she remains on warfarin. Current Medications: Current Outpatient Prescriptions  Medication Sig Dispense Refill  . acetaminophen (TYLENOL) 325 MG tablet Take 650 mg by mouth every 6 (six) hours as needed. Pain      . ALPRAZolam (XANAX) 0.5 MG tablet Take 0.5 mg by mouth daily.       . CYANOCOBALAMIN IJ Inject 1,000 mcg as directed every 30 (thirty) days.        . diphenhydrAMINE (BENADRYL) 25 mg capsule Take 25 mg by mouth every 6 (six) hours as needed. Itching or allergies      . famotidine (PEPCID) 20 MG tablet Take 20 mg by mouth at bedtime.      . folic acid (FOLVITE) 1 MG tablet Take 1 mg by mouth daily.        . fondaparinux (ARIXTRA) 10 MG/0.8ML SOLN Inject 0.8 mLs (10 mg total) into the skin daily.  8 mL  0  . HYDROcodone-acetaminophen (NORCO) 5-325 MG per tablet Take 1 tablet by mouth every 6 (six) hours as needed for pain.  20 tablet  0  . imatinib (GLEEVEC) 400 MG tablet Take 400 mg by mouth daily. Take with meals and large glass of water.Caution:Chemotherapy.      . iron polysaccharides (NIFEREX) 150  MG capsule Take 150 mg by mouth every morning.       . lansoprazole (PREVACID) 15 MG capsule Take 1 capsule (15 mg total) by mouth daily.  30 capsule  5  . warfarin (COUMADIN) 2 MG tablet Take 2 mg by mouth daily. Patient states that she takes 8 mg every other day , 7 mg the other days      . warfarin (COUMADIN) 5 MG tablet Take 5 mg by mouth daily. Patient states that she is taking 8 mg every other day, 7 mg the other days       No current facility-administered medications for this visit.   Facility-Administered Medications Ordered in Other Visits  Medication Dose Route Frequency Provider Last Rate Last Dose  . fondaparinux (ARIXTRA) injection 10 mg  10 mg Subcutaneous Once Dellis Anes, PA   10 mg at 03/17/11 1207   Objective: Blood pressure 130/80, pulse 82, temperature 97.4 F (36.3 C), temperature source Oral, resp. rate 14, height 5\' 7"  (1.702 m), weight 230 lb (104.327 kg). Conjunctiva is pink. Sclera is nonicteric Oropharyngeal mucosa is normal. No neck masses or thyromegaly noted. Cardiac exam with regular rhythm normal S1 and S2. No murmur or gallop noted. Lungs are clear to auscultation. Abdomen is full but soft and nontender without organomegaly or masses. She  has 3 areas with skin rash corresponding shape of Band-Aid that she had at sites of SQ injection.  She has 1+ edema to left leg with stasis dermatitis no edema noted in right leg  Labs/studies Results: H&H from 01/28/2011 10.3 and 33.5. She'll have another one in 2 weeks.  Assessment: #1. Chronic GERD. She has history of ulcerated reflux esophagitis and a moderate size sliding-type hernia her symptoms are not well controlled with current therapy and possibly made worse with use of Gleevec. There is room to go up on her medication as long as she has no side effects. #2. Iron deficiency anemia. Secondary to loss from small bowel GIST. She might also have been losing blood from her upper GI tract. H&H is coming up and she  is tolerating oral iron therapy. #3. Small bowel Gist. Status post segmental more bowel resection about 9 weeks ago and now on Gleevec which she will continue for 3 years as recommended by Dr. Mariel Sleet.   Plan: Increase lansoprazole to 30 mg by mouth every morning. Take Pepcid OTC 20 mg before evening meal or at bedtime. Notify if this combination is ineffective or side effects experienced. Office visit in 6 months. Patient will call us should she develop solid food dysphagia in which case she will need EGD.

## 2011-03-17 NOTE — Patient Instructions (Signed)
Take lansoprazole 30 mg by mouth 30 minutes before breakfast daily. Can take Pepcid OTC 20 mg either before evening meals or at bedtime. Notify if you have any side effects with lansoprazole. Notify if you have swallowing difficulty

## 2011-03-18 ENCOUNTER — Ambulatory Visit (HOSPITAL_COMMUNITY): Payer: Medicare Other

## 2011-03-18 ENCOUNTER — Encounter (HOSPITAL_BASED_OUTPATIENT_CLINIC_OR_DEPARTMENT_OTHER): Payer: Medicare Other

## 2011-03-18 VITALS — BP 126/77 | HR 75 | Temp 97.2°F

## 2011-03-18 DIAGNOSIS — D682 Hereditary deficiency of other clotting factors: Secondary | ICD-10-CM

## 2011-03-18 DIAGNOSIS — I82409 Acute embolism and thrombosis of unspecified deep veins of unspecified lower extremity: Secondary | ICD-10-CM

## 2011-03-18 DIAGNOSIS — Z86718 Personal history of other venous thrombosis and embolism: Secondary | ICD-10-CM

## 2011-03-18 MED ORDER — FONDAPARINUX SODIUM 10 MG/0.8ML ~~LOC~~ SOLN
10.0000 mg | Freq: Once | SUBCUTANEOUS | Status: AC
  Administered 2011-03-18: 10 mg via SUBCUTANEOUS
  Filled 2011-03-18: qty 0.8

## 2011-03-18 NOTE — Progress Notes (Signed)
[name] presents today for injection per the provider's orders.  Arixtra administered without incident; see MAR for injection details.  Patient tolerated procedure well and without incident.  No questions or complaints noted at this time.

## 2011-03-19 ENCOUNTER — Telehealth (HOSPITAL_COMMUNITY): Payer: Self-pay | Admitting: Oncology

## 2011-03-19 ENCOUNTER — Ambulatory Visit (HOSPITAL_COMMUNITY): Payer: Medicare Other

## 2011-03-19 ENCOUNTER — Encounter (HOSPITAL_BASED_OUTPATIENT_CLINIC_OR_DEPARTMENT_OTHER): Payer: Medicare Other

## 2011-03-19 DIAGNOSIS — Z86718 Personal history of other venous thrombosis and embolism: Secondary | ICD-10-CM

## 2011-03-19 DIAGNOSIS — D682 Hereditary deficiency of other clotting factors: Secondary | ICD-10-CM

## 2011-03-19 DIAGNOSIS — I82409 Acute embolism and thrombosis of unspecified deep veins of unspecified lower extremity: Secondary | ICD-10-CM

## 2011-03-19 MED ORDER — FONDAPARINUX SODIUM 10 MG/0.8ML ~~LOC~~ SOLN
10.0000 mg | Freq: Once | SUBCUTANEOUS | Status: AC
  Administered 2011-03-19: 10 mg via SUBCUTANEOUS
  Filled 2011-03-19: qty 0.8

## 2011-03-19 NOTE — Progress Notes (Signed)
[name] presents today for injection per MD orders. arixtra 10 mg administered SQ in left Abdomen. Administration without incident. Patient tolerated well.

## 2011-03-19 NOTE — Telephone Encounter (Signed)
We need Rx written for tasigna to fax to [employer] [facility] for [place]. See Angies note.Marland Kitchen

## 2011-03-20 ENCOUNTER — Other Ambulatory Visit (HOSPITAL_COMMUNITY): Payer: Medicare Other

## 2011-03-20 ENCOUNTER — Encounter (HOSPITAL_COMMUNITY): Payer: Medicare Other

## 2011-03-20 ENCOUNTER — Encounter (HOSPITAL_BASED_OUTPATIENT_CLINIC_OR_DEPARTMENT_OTHER): Payer: Medicare Other

## 2011-03-20 ENCOUNTER — Ambulatory Visit (HOSPITAL_COMMUNITY): Payer: Medicare Other

## 2011-03-20 VITALS — BP 133/82 | HR 75

## 2011-03-20 DIAGNOSIS — D682 Hereditary deficiency of other clotting factors: Secondary | ICD-10-CM

## 2011-03-20 DIAGNOSIS — Z86718 Personal history of other venous thrombosis and embolism: Secondary | ICD-10-CM

## 2011-03-20 DIAGNOSIS — I82409 Acute embolism and thrombosis of unspecified deep veins of unspecified lower extremity: Secondary | ICD-10-CM

## 2011-03-20 DIAGNOSIS — D6851 Activated protein C resistance: Secondary | ICD-10-CM

## 2011-03-20 DIAGNOSIS — D509 Iron deficiency anemia, unspecified: Secondary | ICD-10-CM

## 2011-03-20 LAB — CBC
MCHC: 30.7 g/dL (ref 30.0–36.0)
MCV: 81 fL (ref 78.0–100.0)
Platelets: 316 10*3/uL (ref 150–400)
RDW: 19 % — ABNORMAL HIGH (ref 11.5–15.5)
WBC: 4.2 10*3/uL (ref 4.0–10.5)

## 2011-03-20 LAB — DIFFERENTIAL
Basophils Absolute: 0 10*3/uL (ref 0.0–0.1)
Basophils Relative: 0 % (ref 0–1)
Eosinophils Absolute: 0.2 10*3/uL (ref 0.0–0.7)
Eosinophils Relative: 4 % (ref 0–5)
Lymphocytes Relative: 51 % — ABNORMAL HIGH (ref 12–46)

## 2011-03-20 MED ORDER — FONDAPARINUX SODIUM 10 MG/0.8ML ~~LOC~~ SOLN
10.0000 mg | Freq: Once | SUBCUTANEOUS | Status: AC
  Administered 2011-03-20: 10 mg via SUBCUTANEOUS
  Filled 2011-03-20: qty 0.8

## 2011-03-20 NOTE — Progress Notes (Signed)
[name] presented for labwork. Labs per MD order drawn via Peripheral Line 23 gauge needle inserted in left antecubital.  Good blood return present. Procedure without incident.  Needle removed intact. Patient tolerated procedure well.  [name] presents today for injection per MD orders. Arixtra administered SQ in right Abdomen. Administration without incident. Patient tolerated well.

## 2011-03-21 ENCOUNTER — Encounter (HOSPITAL_BASED_OUTPATIENT_CLINIC_OR_DEPARTMENT_OTHER): Payer: Medicare Other

## 2011-03-21 VITALS — BP 141/83 | HR 78

## 2011-03-21 DIAGNOSIS — Z86718 Personal history of other venous thrombosis and embolism: Secondary | ICD-10-CM

## 2011-03-21 DIAGNOSIS — I82409 Acute embolism and thrombosis of unspecified deep veins of unspecified lower extremity: Secondary | ICD-10-CM

## 2011-03-21 MED ORDER — FONDAPARINUX SODIUM 10 MG/0.8ML ~~LOC~~ SOLN
10.0000 mg | Freq: Once | SUBCUTANEOUS | Status: AC
  Administered 2011-03-21: 10 mg via SUBCUTANEOUS

## 2011-03-21 NOTE — Progress Notes (Signed)
Carole L Burnsworth presents today for injection per MD orders. arixtra 10 mg administered SQ in left Abdomen. Administration without incident. Patient tolerated well.  

## 2011-03-22 ENCOUNTER — Encounter (HOSPITAL_BASED_OUTPATIENT_CLINIC_OR_DEPARTMENT_OTHER): Payer: Medicare Other

## 2011-03-22 ENCOUNTER — Telehealth (HOSPITAL_COMMUNITY): Payer: Self-pay | Admitting: *Deleted

## 2011-03-22 VITALS — BP 124/81 | HR 86

## 2011-03-22 DIAGNOSIS — I82409 Acute embolism and thrombosis of unspecified deep veins of unspecified lower extremity: Secondary | ICD-10-CM

## 2011-03-22 DIAGNOSIS — Z86718 Personal history of other venous thrombosis and embolism: Secondary | ICD-10-CM

## 2011-03-22 MED ORDER — FONDAPARINUX SODIUM 10 MG/0.8ML ~~LOC~~ SOLN
10.0000 mg | Freq: Once | SUBCUTANEOUS | Status: AC
  Administered 2011-03-22: 10 mg via SUBCUTANEOUS

## 2011-03-22 NOTE — Telephone Encounter (Signed)
Pt received last ordered dose of arixtra today. Had PT level done this past Friday and sees you on this coming Friday. Also did you get the message re: rx for Fayrene Helper can be faxed to company and there should be no problem with switching her over.

## 2011-03-22 NOTE — Progress Notes (Signed)
Alannis L Plott presents today for injection per MD orders. arixtra 10 mg administered SQ in left Abdomen. Administration without incident. Patient tolerated well.  

## 2011-03-23 ENCOUNTER — Other Ambulatory Visit (HOSPITAL_COMMUNITY): Payer: Self-pay | Admitting: Oncology

## 2011-03-23 ENCOUNTER — Telehealth (HOSPITAL_COMMUNITY): Payer: Self-pay | Admitting: *Deleted

## 2011-03-23 DIAGNOSIS — D6851 Activated protein C resistance: Secondary | ICD-10-CM

## 2011-03-23 DIAGNOSIS — I82409 Acute embolism and thrombosis of unspecified deep veins of unspecified lower extremity: Secondary | ICD-10-CM

## 2011-03-23 NOTE — Telephone Encounter (Signed)
Message copied by Dennie Maizes on Mon Mar 23, 2011  1:33 PM ------      Message from: Ellouise Newer III      Created: Mon Mar 23, 2011 12:40 PM       Same dose            PT/INR in 10 days

## 2011-03-23 NOTE — Telephone Encounter (Signed)
Spoke with pt. She is to continue Coumadin 7 mg alternating with 8 mg every other day. RTC in 10 days for lab. Pt verbalized understanding.

## 2011-03-24 ENCOUNTER — Telehealth (HOSPITAL_COMMUNITY): Payer: Self-pay | Admitting: Oncology

## 2011-03-25 ENCOUNTER — Encounter (HOSPITAL_COMMUNITY): Payer: Self-pay | Admitting: *Deleted

## 2011-03-25 ENCOUNTER — Other Ambulatory Visit: Payer: Self-pay

## 2011-03-25 ENCOUNTER — Encounter (HOSPITAL_BASED_OUTPATIENT_CLINIC_OR_DEPARTMENT_OTHER): Payer: Medicare Other | Admitting: Oncology

## 2011-03-25 ENCOUNTER — Other Ambulatory Visit (HOSPITAL_COMMUNITY): Payer: Self-pay | Admitting: *Deleted

## 2011-03-25 ENCOUNTER — Encounter: Payer: Self-pay | Admitting: Physician Assistant

## 2011-03-25 ENCOUNTER — Inpatient Hospital Stay (HOSPITAL_COMMUNITY): Payer: Medicare Other

## 2011-03-25 ENCOUNTER — Ambulatory Visit (INDEPENDENT_AMBULATORY_CARE_PROVIDER_SITE_OTHER): Payer: Medicare Other | Admitting: Physician Assistant

## 2011-03-25 ENCOUNTER — Inpatient Hospital Stay (HOSPITAL_COMMUNITY)
Admission: AD | Admit: 2011-03-25 | Discharge: 2011-03-30 | DRG: 309 | Disposition: A | Discharge status: Home / Self Care | Payer: Medicare Other | Source: Ambulatory Visit | Attending: Internal Medicine | Admitting: Internal Medicine

## 2011-03-25 DIAGNOSIS — I82409 Acute embolism and thrombosis of unspecified deep veins of unspecified lower extremity: Secondary | ICD-10-CM

## 2011-03-25 DIAGNOSIS — Z880 Allergy status to penicillin: Secondary | ICD-10-CM

## 2011-03-25 DIAGNOSIS — D6851 Activated protein C resistance: Secondary | ICD-10-CM

## 2011-03-25 DIAGNOSIS — I4729 Other ventricular tachycardia: Principal | ICD-10-CM | POA: Diagnosis present

## 2011-03-25 DIAGNOSIS — I471 Supraventricular tachycardia, unspecified: Secondary | ICD-10-CM

## 2011-03-25 DIAGNOSIS — R000 Tachycardia, unspecified: Secondary | ICD-10-CM

## 2011-03-25 DIAGNOSIS — I498 Other specified cardiac arrhythmias: Secondary | ICD-10-CM

## 2011-03-25 DIAGNOSIS — F430 Acute stress reaction: Secondary | ICD-10-CM

## 2011-03-25 DIAGNOSIS — D6859 Other primary thrombophilia: Secondary | ICD-10-CM | POA: Diagnosis present

## 2011-03-25 DIAGNOSIS — Z882 Allergy status to sulfonamides status: Secondary | ICD-10-CM

## 2011-03-25 DIAGNOSIS — C494 Malignant neoplasm of connective and soft tissue of abdomen: Secondary | ICD-10-CM | POA: Diagnosis present

## 2011-03-25 DIAGNOSIS — R231 Pallor: Secondary | ICD-10-CM

## 2011-03-25 DIAGNOSIS — C49A0 Gastrointestinal stromal tumor, unspecified site: Secondary | ICD-10-CM | POA: Diagnosis present

## 2011-03-25 DIAGNOSIS — T463X5A Adverse effect of coronary vasodilators, initial encounter: Secondary | ICD-10-CM | POA: Diagnosis present

## 2011-03-25 DIAGNOSIS — K219 Gastro-esophageal reflux disease without esophagitis: Secondary | ICD-10-CM | POA: Diagnosis present

## 2011-03-25 DIAGNOSIS — Z91018 Allergy to other foods: Secondary | ICD-10-CM

## 2011-03-25 DIAGNOSIS — Z79899 Other long term (current) drug therapy: Secondary | ICD-10-CM

## 2011-03-25 DIAGNOSIS — Z888 Allergy status to other drugs, medicaments and biological substances status: Secondary | ICD-10-CM

## 2011-03-25 DIAGNOSIS — I9589 Other hypotension: Secondary | ICD-10-CM | POA: Diagnosis present

## 2011-03-25 DIAGNOSIS — Z86718 Personal history of other venous thrombosis and embolism: Secondary | ICD-10-CM

## 2011-03-25 DIAGNOSIS — F411 Generalized anxiety disorder: Secondary | ICD-10-CM | POA: Diagnosis present

## 2011-03-25 DIAGNOSIS — E538 Deficiency of other specified B group vitamins: Secondary | ICD-10-CM | POA: Diagnosis present

## 2011-03-25 DIAGNOSIS — I472 Ventricular tachycardia, unspecified: Principal | ICD-10-CM | POA: Diagnosis present

## 2011-03-25 DIAGNOSIS — Z7901 Long term (current) use of anticoagulants: Secondary | ICD-10-CM

## 2011-03-25 DIAGNOSIS — Z91040 Latex allergy status: Secondary | ICD-10-CM

## 2011-03-25 HISTORY — DX: Ventricular tachycardia: I47.2

## 2011-03-25 HISTORY — DX: Gastro-esophageal reflux disease without esophagitis: K21.9

## 2011-03-25 LAB — PRO B NATRIURETIC PEPTIDE: Pro B Natriuretic peptide (BNP): 2491 pg/mL — ABNORMAL HIGH (ref 0–125)

## 2011-03-25 LAB — CBC
HCT: 35.8 % — ABNORMAL LOW (ref 36.0–46.0)
MCHC: 30.4 g/dL (ref 30.0–36.0)
MCV: 81.2 fL (ref 78.0–100.0)
RDW: 19 % — ABNORMAL HIGH (ref 11.5–15.5)
WBC: 5.4 10*3/uL (ref 4.0–10.5)

## 2011-03-25 LAB — DIFFERENTIAL
Basophils Absolute: 0 10*3/uL (ref 0.0–0.1)
Eosinophils Relative: 3 % (ref 0–5)
Lymphocytes Relative: 43 % (ref 12–46)
Monocytes Absolute: 0.6 10*3/uL (ref 0.1–1.0)

## 2011-03-25 LAB — BASIC METABOLIC PANEL
BUN: 11 mg/dL (ref 6–23)
CO2: 23 mEq/L (ref 19–32)
Calcium: 9 mg/dL (ref 8.4–10.5)
Creatinine, Ser: 0.71 mg/dL (ref 0.50–1.10)

## 2011-03-25 LAB — CARDIAC PANEL(CRET KIN+CKTOT+MB+TROPI): Total CK: 108 U/L (ref 7–177)

## 2011-03-25 MED ORDER — FAMOTIDINE 20 MG PO TABS
20.0000 mg | ORAL_TABLET | Freq: Every day | ORAL | Status: DC
  Administered 2011-03-25 – 2011-03-29 (×5): 20 mg via ORAL
  Filled 2011-03-25 (×7): qty 1

## 2011-03-25 MED ORDER — WARFARIN SODIUM 1 MG PO TABS
ORAL_TABLET | ORAL | Status: AC
  Filled 2011-03-25: qty 2

## 2011-03-25 MED ORDER — ADENOSINE 6 MG/2ML IV SOLN: 12.0000 mg | Freq: Once | INTRAVENOUS | Status: DC

## 2011-03-25 MED ORDER — ACETAMINOPHEN 325 MG PO TABS
650.0000 mg | ORAL_TABLET | Freq: Four times a day (QID) | ORAL | Status: DC | PRN
  Administered 2011-03-29: 650 mg via ORAL
  Filled 2011-03-25: qty 2

## 2011-03-25 MED ORDER — ADENOSINE 6 MG/2ML IV SOLN: 18.0000 mg | Freq: Once | INTRAVENOUS | Status: DC

## 2011-03-25 MED ORDER — ONDANSETRON HCL 4 MG/2ML IJ SOLN
4.0000 mg | Freq: Four times a day (QID) | INTRAMUSCULAR | Status: DC | PRN
  Filled 2011-03-25: qty 2

## 2011-03-25 MED ORDER — FOLIC ACID 1 MG PO TABS
1.0000 mg | ORAL_TABLET | Freq: Every day | ORAL | Status: DC
  Administered 2011-03-25 – 2011-03-30 (×6): 1 mg via ORAL
  Filled 2011-03-25 (×6): qty 1

## 2011-03-25 MED ORDER — SODIUM CHLORIDE 0.9 % IV SOLN
INTRAVENOUS | Status: DC
  Administered 2011-03-25 (×2): via INTRAVENOUS
  Administered 2011-03-26: 1000 mL/h via INTRAVENOUS
  Administered 2011-03-27: 20:00:00 via INTRAVENOUS

## 2011-03-25 MED ORDER — ADENOSINE 6 MG/2ML IV SOLN: 6.0000 mg | Freq: Once | INTRAVENOUS | Status: DC

## 2011-03-25 MED ORDER — WARFARIN SODIUM 2 MG PO TABS: 2.0000 mg | ORAL_TABLET | Freq: Every day | ORAL | Status: DC

## 2011-03-25 MED ORDER — ONDANSETRON HCL 4 MG PO TABS: 4.0000 mg | ORAL_TABLET | Freq: Four times a day (QID) | ORAL | Status: DC | PRN

## 2011-03-25 MED ORDER — WARFARIN - PHARMACIST DOSING INPATIENT
Freq: Every day | Status: DC
  Administered 2011-03-27 – 2011-03-28 (×2)
  Filled 2011-03-25 (×6): qty 1

## 2011-03-25 MED ORDER — WARFARIN SODIUM 5 MG PO TABS: 5.0000 mg | ORAL_TABLET | Freq: Every day | ORAL | Status: DC

## 2011-03-25 MED ORDER — ALPRAZOLAM 0.5 MG PO TABS
0.5000 mg | ORAL_TABLET | Freq: Every day | ORAL | Status: DC
  Administered 2011-03-25 – 2011-03-30 (×6): 0.5 mg via ORAL
  Filled 2011-03-25 (×6): qty 1

## 2011-03-25 MED ORDER — POLYSACCHARIDE IRON COMPLEX 150 MG PO CAPS
150.0000 mg | ORAL_CAPSULE | ORAL | Status: DC
  Administered 2011-03-26 – 2011-03-29 (×4): 150 mg via ORAL
  Filled 2011-03-25 (×7): qty 1

## 2011-03-25 MED ORDER — SODIUM CHLORIDE 0.9 % IV SOLN: 250.0000 mL | Freq: Once | INTRAVENOUS | Status: DC

## 2011-03-25 MED ORDER — IMATINIB MESYLATE 100 MG PO TABS
400.0000 mg | ORAL_TABLET | Freq: Every day | ORAL | Status: DC
  Filled 2011-03-25 (×2): qty 4

## 2011-03-25 MED ORDER — CARVEDILOL 6.25 MG PO TABS
6.2500 mg | ORAL_TABLET | Freq: Two times a day (BID) | ORAL | Status: DC
  Administered 2011-03-25 – 2011-03-26 (×2): 6.25 mg via ORAL
  Filled 2011-03-25: qty 1
  Filled 2011-03-25 (×2): qty 2
  Filled 2011-03-25: qty 1

## 2011-03-25 MED ORDER — SODIUM CHLORIDE 0.9 % IJ SOLN
3.0000 mL | Freq: Two times a day (BID) | INTRAMUSCULAR | Status: DC
  Administered 2011-03-25 – 2011-03-30 (×9): 3 mL via INTRAVENOUS
  Filled 2011-03-25 (×2): qty 3

## 2011-03-25 MED ORDER — PANTOPRAZOLE SODIUM 40 MG PO PACK
20.0000 mg | PACK | Freq: Every day | ORAL | Status: DC
  Administered 2011-03-25 – 2011-03-26 (×2): 20 mg via ORAL
  Filled 2011-03-25 (×3): qty 20

## 2011-03-25 MED ORDER — DIPHENHYDRAMINE HCL 25 MG PO CAPS
25.0000 mg | ORAL_CAPSULE | Freq: Four times a day (QID) | ORAL | Status: DC | PRN
  Administered 2011-03-29: 25 mg via ORAL
  Filled 2011-03-25: qty 1

## 2011-03-25 MED ORDER — METOPROLOL TARTRATE 5 MG/5ML IV SOLN: 5.0000 mg | Freq: Once | INTRAVENOUS | Status: DC

## 2011-03-25 MED ORDER — WARFARIN SODIUM 5 MG PO TABS
ORAL_TABLET | ORAL | Status: AC
  Filled 2011-03-25: qty 1

## 2011-03-25 NOTE — Progress Notes (Signed)
ANTICOAGULATION CONSULT NOTE - Initial Consult  Pharmacy Consult for Coumadin Indication: VTE Treatment  Allergies  Allergen Reactions  . Cephalexin Swelling  . Dexlansoprazole Swelling  . Latex Itching  . Other Swelling    Patient states that Pecans cause her mouth to swell.  . Pantoprazole Sodium Swelling  . Penicillins Swelling  . Tape Itching  . Lovenox Itching, Swelling and Palpitations  . Nylon Rash  . Omeprazole Itching and Rash  . Sulfonamide Derivatives Rash    Patient Measurements: Height: 5' 7.5" (171.5 cm) Weight: 235 lb 14.3 oz (107 kg) IBW/kg (Calculated) : 62.75   Vital Signs: BP: 114/74 mmHg (02/27 1711) Pulse Rate: 146  (02/27 1711)  Labs:  Estimated Creatinine Clearance: 96.2 ml/min (by C-G formula based on Cr of 0.71).  Medical History: Past Medical History  Diagnosis Date  . Vitamin B12 deficiency     vit b12 1000 mcg monthly  . Cellulitis of left leg 2006  . Ulcer 05/2009    esophageal  . Clotting disorder     heterozygosity from factor v leiden  . Pernicious anemia 07/10/2010  . DVT (deep venous thrombosis) 07/10/2010  . Factor V Leiden   . History of blood clots     lower legs  . Blood dyscrasia   . Anxiety   . Small bowel mass 01/03/2011  . Allergic urticaria 01/04/2011    Rash from tape.  Marland Kitchen GERD (gastroesophageal reflux disease)     Medications:  Scheduled:    . adenosine (ADENOCARD) IV  18 mg Intravenous Once  . ALPRAZolam  0.5 mg Oral Daily  . famotidine  20 mg Oral QHS  . folic acid  1 mg Oral Daily  . imatinib  400 mg Oral Q breakfast  . iron polysaccharides  150 mg Oral BH-q7a  . pantoprazole sodium  20 mg Oral Q1200  . sodium chloride  3 mL Intravenous Q12H  . warfarin  2 mg Oral Daily  . warfarin  5 mg Oral Daily  . Warfarin - Pharmacist Dosing Inpatient   Does not apply q1800    Assessment: INR elevated 3.46 today  Goal of Therapy:  INR 2-3   Plan:  No Coumadin today due to elevated INR INR/PT daily Monitor  CBC  [name], [name] 03/25/2011,6:31 PM

## 2011-03-25 NOTE — Progress Notes (Signed)
Dr Dietrich Pates present for assessment.  BP continues to be 82/60.  No acute distress noted, aside from fatigue.

## 2011-03-25 NOTE — Progress Notes (Signed)
Cardiology Attending  Patient was evaluated in the office today at the request of Hematology/Oncology for tachycardia. She noted the onset of palpitations the night before admission, but has had no other associated symptoms. There is no history of cardiac disease or arrhythmias. A brief echocardiogram in the office showed no valvular abnormalities but moderately impaired left ventricular systolic function. Adenosine, 6 and 12 mg was administered without effect.  5 mg of intravenous metoprolol was then given resulting in hypotension with blood pressure as low as 70 systolic. After 1 L of fluid, systolic pressure had increased only to 80 mmHg prompting placement in observation status with the kind assistance of the hospitalist service. Repeat EKG shows that every fourth beat is now a sinus beat. Heart rate slowed with metoprolol from 160-140 bpm, but is now gradually increasing as that medication is metabolized. We will start low-dose carvedilol to determine if she can tolerate any beta blocker. The sequence of events is unclear. She may have a tachycardia induced cardiomyopathy, although 12-18 hrs. would be a rather short period to develop such a process. Alternatively, she may have a cardiomyopathy, which resulted in tachycardia. Gleevec is a potential offending agent and will be held for now.  TSH level is pending.

## 2011-03-25 NOTE — Assessment & Plan Note (Addendum)
The patient did not convert with Valsalva, carotid massage, head and an ice bucket, or adenosine 6 and 12 mg . Her BP dropped to 82/50 after Lopressor 5mg . She has been given 750cc normal saline. We will have the hospitalist admit her and Dr. Dietrich Pates will give her more meds upstairs.

## 2011-03-25 NOTE — Patient Instructions (Signed)
[name]  [id number] 08-18-1950   [facility]  Discharge Instructions  RECOMMENDATIONS MADE BY THE CONSULTANT AND ANY TEST RESULTS WILL BE SENT TO YOUR REFERRING DOCTOR.   EXAM FINDINGS BY MD TODAY AND SIGNS AND SYMPTOMS TO REPORT TO CLINIC OR PRIMARY MD: Will get you seen in the Cardiology Clinic today.  Start taking your coumadin at 6 pm each day instead of the morning.  MEDICATIONS PRESCRIBED: none      SPECIAL INSTRUCTIONS/FOLLOW-UP: As scheduled.   I acknowledge that I have been informed and understand all the instructions given to me and received a copy. I do not have any more questions at this time, but understand that I may call the Specialty Clinic at [facility] at [phone] during business hours should I have any further questions or need assistance in obtaining follow-up care.    __________________________________________  _____________  __________ Signature of Patient or Authorized Representative            Date                   Time    __________________________________________ Nurse's Signature

## 2011-03-25 NOTE — H&P (Signed)
[name] MRN: [id number] DOB/AGE: March 19, 1950 61 y.o. Primary Care Physician:MOORE, [name], MD, MD Admit date: 03/25/2011 Chief Complaint: Palpitations. HPI: This 61 year old lady was seen in the oncology outpatient department today. She has a history of resection of a gastrointestinal stromal tumor and is on Gleevec. She also has factor V deficiency. She is on lifelong warfarin. She described that she had palpitations which started yesterday evening associated with some dyspnea and some lightheadedness. She denies any chest pain, cough or hemoptysis. When she was seen in the oncology Department, she was noted to have a tachycardia. She was therefore referred to cardiology (Dr. Dietrich Pates) who found her to be in SVT. Several maneuvers were done, including intravenous adenosine, carotid massage, ice. She was given intravenous metoprolol which resulted in hypotension. Then she was given intravenous fluids and in view of her hemodynamic instability, she was sent directly to the hospital for remission. She remains in SVT.  Past Medical History  Diagnosis Date  . Vitamin B12 deficiency     vit b12 1000 mcg monthly  . Cellulitis of left leg 2006  . Ulcer 05/2009    esophageal  . Clotting disorder     heterozygosity from factor v leiden  . Pernicious anemia 07/10/2010  . DVT (deep venous thrombosis) 07/10/2010  . Factor V Leiden   . History of blood clots     lower legs  . Blood dyscrasia   . Anxiety   . Small bowel mass 01/03/2011  . Allergic urticaria 01/04/2011    Rash from tape.   Past Surgical History  Procedure Date  . Abdominal hysterectomy 1989  . Balloon dilation 12/11/2010    Procedure: BALLOON DILATION;  Surgeon: Malissa Hippo, MD;  Location: [facility];  Service: Endoscopy;  Laterality: N/A;  . Laparotomy 01/05/2011    Procedure: EXPLORATORY LAPAROTOMY;  Surgeon: Dalia Heading;  Location: [facility];  Service: General;  Laterality: N/A;  . Bowel resection 01/05/2011   Procedure: SMALL BOWEL RESECTION;  Surgeon: Dalia Heading;  Location: [facility];  Service: General;;  Partial Small Bowel Resection  . Givens capsule study 01/02/2011    Procedure: GIVENS CAPSULE STUDY;  Surgeon: Malissa Hippo, MD;  Location: [facility];  Service: Endoscopy;  Laterality: N/A;          Social History: She has been married for 44 years. She does not smoke and does not drink alcohol.   Allergies:  Allergies  Allergen Reactions  . Cephalexin Swelling  . Dexlansoprazole Swelling  . Latex Itching  . Other Swelling    Patient states that Pecans cause her mouth to swell.  . Pantoprazole Sodium Swelling  . Penicillins Swelling  . Tape Itching  . Lovenox Itching, Swelling and Palpitations  . Nylon Rash  . Omeprazole Itching and Rash  . Sulfonamide Derivatives Rash  . Vanilla Swelling    Lips swell with vanilla pudding    Medications Prior to Admission  Medication Dose Route Frequency Provider Last Rate Last Dose  . 0.9 %  sodium chloride infusion   Intravenous Continuous Brinson Tozzi C Gurjit Loconte, MD      . acetaminophen (TYLENOL) tablet 650 mg  650 mg Oral Q6H PRN Cambell Rickenbach C Kasem Mozer, MD      . ALPRAZolam Prudy Feeler) tablet 0.5 mg  0.5 mg Oral Daily Alima Naser C Eriyah Fernando, MD      . diphenhydrAMINE (BENADRYL) capsule 25 mg  25 mg Oral Q6H PRN Kylyn Sookram Normajean Glasgow, MD      . famotidine (  PEPCID) tablet 20 mg  20 mg Oral QHS Deitra Craine C Weslyn Holsonback, MD      . folic acid (FOLVITE) tablet 1 mg  1 mg Oral Daily Eain Mullendore C Antwan Bribiesca, MD      . imatinib (GLEEVEC) tablet 400 mg  400 mg Oral Q breakfast Herny Scurlock C Kimmora Risenhoover, MD      . iron polysaccharides (NIFEREX) capsule 150 mg  150 mg Oral BH-q7a Gwendlyn Hanback C Kaspian Muccio, MD      . ondansetron (ZOFRAN) tablet 4 mg  4 mg Oral Q6H PRN Estrella Alcaraz Normajean Glasgow, MD       Or  . ondansetron (ZOFRAN) injection 4 mg  4 mg Intravenous Q6H PRN Tauriel Scronce C Adalynd Donahoe, MD      . pantoprazole (PROTONIX) EC tablet 20 mg  20 mg Oral Q1200 Dex Blakely C Jacoby Zanni, MD      . sodium chloride 0.9 %  injection 3 mL  3 mL Intravenous Q12H Arlissa Monteverde C Shyann Hefner, MD      . warfarin (COUMADIN) tablet 2 mg  2 mg Oral Daily Anyela Napierkowski C Sharmarke Cicio, MD      . warfarin (COUMADIN) tablet 5 mg  5 mg Oral Daily Davionte Lusby Normajean Glasgow, MD       Medications Prior to Admission  Medication Sig Dispense Refill  . acetaminophen (TYLENOL) 325 MG tablet Take 650 mg by mouth every 6 (six) hours as needed. Pain      . ALPRAZolam (XANAX) 0.5 MG tablet Take 0.5 mg by mouth daily.       . CYANOCOBALAMIN IJ Inject 1,000 mcg as directed every 30 (thirty) days.        . diphenhydrAMINE (BENADRYL) 25 mg capsule Take 25 mg by mouth every 6 (six) hours as needed. Itching or allergies      . famotidine (PEPCID) 20 MG tablet Take 20 mg by mouth at bedtime.      . folic acid (FOLVITE) 1 MG tablet Take 1 mg by mouth daily.        . iron polysaccharides (NIFEREX) 150 MG capsule Take 150 mg by mouth every morning.       . lansoprazole (PREVACID) 15 MG capsule Take 1 capsule (15 mg total) by mouth daily.  30 capsule  5  . warfarin (COUMADIN) 2 MG tablet Take 2 mg by mouth daily. Patient states that she takes 8 mg every other day , 7 mg the other days      . warfarin (COUMADIN) 5 MG tablet Take 5 mg by mouth daily. Patient states that she is taking 8 mg every other day, 7 mg the other days           WUJ:WJXBJ from the symptoms mentioned above,there are no other symptoms referable to all systems reviewed.  Physical Exam:  Blood pressure 104/83, pulse 145- 150 in SVT. Afebrile. Saturating 94% on room air. She is alert and orientated. Heart sounds are present and regular with a resting tachycardia as outlined above. Lung fields are clear.  There is no pleural rub. Abdomen is soft and nontender. There is no hepatosplenomegaly. Neurologically, there are no focal neurological signs.   Basename 03/25/11 0911  WBC 5.4  NEUTROABS 2.3  HGB 10.9*  HCT 35.8*  MCV 81.2  PLT 366    Basename 03/25/11 0911  NA 142  K 4.0  CL 110  CO2 23    GLUCOSE 112*  BUN 11  CREATININE 0.71  CALCIUM 9.0  MG --         US  Venous Img Lower Unilateral Left  03/13/2011  *RADIOLOGY REPORT*  Clinical Data: Left leg swelling, rule out DVT  LEFT LOWER EXTREMITY VENOUS DUPLEX ULTRASOUND  Technique:  Gray-scale sonography with graded compression, as well as color Doppler and duplex ultrasound, were performed to evaluate the deep venous system of the lower extremity from the level of the common femoral vein through the popliteal and proximal calf veins. Spectral Doppler was utilized to evaluate flow at rest and with distal augmentation maneuvers.  Comparison:  10/27/2004  Findings: Ultrasound examination of the left lower extremity was performed.  Nonobstructive thrombus noted in the left common femoral vein. There is a obstructive thrombus in the femoral vein extending in popliteal vein, posterior tibial vein and peroneal vein with decreased compressibility.  Findings are consistent with deep vein thrombosis.  IMPRESSION: Deep vein thrombosis with occlusive thrombus femoral vein extending in the left popliteal left peroneal and left posterior tibial vein.  Nonocclusive thrombus in the left common femoral vein.  This was made a call report.  Original Report Authenticated By: Natasha Mead, M.D.   Impression:  1. SVT. 2. Gastrointestinal stromal tumor, on Gleevec. 3. Factor V deficiency. 4. Chronic anticoagulation.     Plan:  1. Admit to step down unit. 2. Cardiology consultation. She is currently hemodynamically stable. I will leave it to Dr. Dietrich Pates to decide which medication he would choose to chemically cardiovert this lady. Of course, if this is not successful, I told her that she may need electrical cardioversion. 3. Check d-dimer, although I do not think pulmonary embolism is likely. If d-dimer is elevated, she will need a CT angiogram of the chest to rule out pulmonary embolism.      Wilson Singer Pager [phone]  03/25/2011, 5:09  PM

## 2011-03-25 NOTE — Progress Notes (Signed)
1435 - #20 ga iv started in LAC without difficulty.  250 cc NS infusing well wide open rate.  HR 162 with BP 122/77.  6 mg IV adenosine given @ 1447 rapid push without response to rate.  Herma Carson, PA present.  Pt c/o chest tightness immediately following injection, which subsided.  1449- 12 mg adenosine given rapid IV push without rate change.  Rate continues to be 150-160.  Pt again experienced chest tightness which subsided shortly after injection.  No acute distress noted at this time.  1512 - 5 mg of Lopressor given per order.  BP 121/77 with 162 heart rate.  No acute distress, however states headache and lightheadedness.  BP 88/62.

## 2011-03-25 NOTE — Assessment & Plan Note (Signed)
Had a recent left lower extremity DVT when INR was subtherapeutic after recent abdominal surgery. Now back on Coumadin. Doesn't seem to have symptoms suggestive of a pulmonary embolus.

## 2011-03-25 NOTE — Progress Notes (Signed)
Patient is seen as a walk-in today. She reports some dizziness that she experienced yesterday. She also notes that she has had some dizziness this morning. She explains that her blood pressure earlier this morning was low for her.   The nurse took her vital signs it was noted that she had a mildly hypotensive blood pressure with tachycardia. She is also noted to be pale in color. As a result I was asked to see the patient.   the patient reiterated the above information. She denies any heart palpitations, but she does admit that she senses her heart is pumping vigorously. She denies any chest pain or shortness of breath. She admits to some black stools which are secondary to her iron pills. Following her physical examination, I placed orders for his CBC, differential, and basic metabolic panel. The patient metabolic panel is unremarkable. Her hemoglobin is stable at 10.9 g/dL.  It should be noted that the patient is under a tremendous amount of stress with her daughter and her daughter-in-law who was recently diagnosed with breast cancer and underwent her lumpectomy yesterday.    ROS: No TIA's no dysphagia.  No prolonged cough. No dyspnea or chest pain on exertion.  No abdominal pain, change in bowel habits,  bloody stools.  No urinary tract symptoms.  No new or unusual musculoskeletal symptoms.   No signs of bleeding.   The patient's vital sign follows: BP 100/72  Pulse 144  Supine: BP 122/87, HR 142 Sitting: BP 128/78, HR 144 Standing: 100/72, HR 144   Gen.: Patient seen in the examination room. She does appear pale. She not appear been acute distress. She is anxious. Alert and oriented x3. HEENT: Atraumatic, normocephalic, anicteric sclera. Conjunctiva pale in color. No oral abnormalities. Neck: Trachea midline Cardiac: Regular rate and rhythm with tachycardia with a heart rate of approximately 140 beats per minute. No appreciable rub, gallop, or murmur. Normal S1 and S2. No S3 or S4  appreciated. Lungs: Clear to auscultation bilaterally without wheezes rales or rhonchi. No sensory muscle use appreciated. No decrease sounds and any fields. Abdomen: Positive bowel sounds in all 4 quadrants. Nontender. Extremities: A refill less than 1 second and fingers. No appreciable edema. Skin: Skin is warm and dry. Neuro: Patient is alert oriented x3. No focal deficits appreciated.   CBC    Component Value Date/Time   WBC 5.4 03/25/2011 0911   RBC 4.41 03/25/2011 0911   HGB 10.9* 03/25/2011 0911   HCT 35.8* 03/25/2011 0911   PLT 366 03/25/2011 0911   MCV 81.2 03/25/2011 0911   MCH 24.7* 03/25/2011 0911   MCHC 30.4 03/25/2011 0911   RDW 19.0* 03/25/2011 0911   LYMPHSABS 2.4 03/25/2011 0911   MONOABS 0.6 03/25/2011 0911   EOSABS 0.1 03/25/2011 0911   BASOSABS 0.0 03/25/2011 0911      Chemistry      Component Value Date/Time   NA 142 03/25/2011 0911   K 4.0 03/25/2011 0911   CL 110 03/25/2011 0911   CO2 23 03/25/2011 0911   BUN 11 03/25/2011 0911   CREATININE 0.71 03/25/2011 0911      Component Value Date/Time   CALCIUM 9.0 03/25/2011 0911   ALKPHOS 97 10/09/2010 0939   AST 48* 10/09/2010 0939   ALT 21 10/09/2010 0939   BILITOT 0.2* 10/09/2010 0939     Assessment: 1. Sinus tachycardia with premature ventricular contractions.   2. Dizziness 3. Headaches 4. Personal life stressors. 5. GIST (gastrointestinal stromal tumor), stage IIB on Gleevac  presently. 6. Factor V Leiden, on lifelong Coumadin 7. Recent DVT, secondary to a short interval of sub therapeutic INR values, S/P Arixtra injections x 10 days in conjunction with Coumadin   Plan: 1. Lab work stat: CBC diff, BMET 2. EKG stat 3. Orthostatic BPs 4. Discussed the patient's case with Dr. Mariel Sleet and he recommended cardiology consultation. 5. [facility] and they will see her this afternoon in consultation. 6. EKG print-out given to patient to hand deliver for her appointment this afternoon. 7. Return as  scheduled.  Patient and plan discussed with Dr. Mariel Sleet and he is in agreement with the aforementioned.   Pharoah Goggins

## 2011-03-25 NOTE — Progress Notes (Signed)
HPI: This is a very pleasant 61 year old white female patient who was sent our office from Dr. Harrell Gave office today because she was found to be in rapid SVT. She has no prior cardiac history and has never been seen by cardiologist. Last night she noticed her heart was racing but it seemed to settle down and she could go to sleep. When she got up this morning it was much worse and when she checked her blood pressure the heart monitor said it was 145 beats per minute. She went to see Dellis Anes, PA with Dr. Devra Dopp today. She has a history of factor V deficiency and is on chronic Coumadin. She recently had abdominal surgery for a gastrointestinal stromal tumor that's been treated with Gleevec. She did develop a DVT in her left leg when her INR as were subtherapeutic. She is on Coumadin.  The patient does have some dyspnea with the SVT but denies chest pain, dizziness, or presyncope. She's never had this before. She does drink a lot of caffeine.   Allergies  Allergen Reactions  . Cephalexin Swelling  . Dexlansoprazole Swelling  . Latex Itching  . Other Swelling    Patient states that Pecans cause her mouth to swell.  . Pantoprazole Sodium Swelling  . Penicillins Swelling  . Tape Itching  . Lovenox Itching, Swelling and Palpitations  . Nylon Rash  . Omeprazole Itching and Rash  . Sulfonamide Derivatives Rash  . Vanilla Swelling    Lips swell with vanilla pudding    Current Outpatient Prescriptions on File Prior to Visit  Medication Sig Dispense Refill  . acetaminophen (TYLENOL) 325 MG tablet Take 650 mg by mouth every 6 (six) hours as needed. Pain      . ALPRAZolam (XANAX) 0.5 MG tablet Take 0.5 mg by mouth daily.       . CYANOCOBALAMIN IJ Inject 1,000 mcg as directed every 30 (thirty) days.        . diphenhydrAMINE (BENADRYL) 25 mg capsule Take 25 mg by mouth every 6 (six) hours as needed. Itching or allergies      . famotidine (PEPCID) 20 MG tablet Take 20 mg by mouth at  bedtime.      . folic acid (FOLVITE) 1 MG tablet Take 1 mg by mouth daily.        . iron polysaccharides (NIFEREX) 150 MG capsule Take 150 mg by mouth every morning.       . lansoprazole (PREVACID) 15 MG capsule Take 1 capsule (15 mg total) by mouth daily.  30 capsule  5  . warfarin (COUMADIN) 2 MG tablet Take 2 mg by mouth daily. Patient states that she takes 8 mg every other day , 7 mg the other days      . warfarin (COUMADIN) 5 MG tablet Take 5 mg by mouth daily. Patient states that she is taking 8 mg every other day, 7 mg the other days        Past Medical History  Diagnosis Date  . Vitamin B12 deficiency     vit b12 1000 mcg monthly  . Cellulitis of left leg 2006  . Ulcer 05/2009    esophageal  . Clotting disorder     heterozygosity from factor v leiden  . Pernicious anemia 07/10/2010  . DVT (deep venous thrombosis) 07/10/2010  . Factor V Leiden   . History of blood clots     lower legs  . Blood dyscrasia   . Anxiety   . Small bowel  mass 01/03/2011  . Allergic urticaria 01/04/2011    Rash from tape.    Past Surgical History  Procedure Date  . Abdominal hysterectomy 1989  . Balloon dilation 12/11/2010    Procedure: BALLOON DILATION;  Surgeon: Malissa Hippo, MD;  [facility];  Service: Endoscopy;  Laterality: N/A;  . Laparotomy 01/05/2011    Procedure: EXPLORATORY LAPAROTOMY;  Surgeon: Dalia Heading;  Location: [facility];  Service: General;  Laterality: N/A;  . Bowel resection 01/05/2011    Procedure: SMALL BOWEL RESECTION;  Surgeon: Dalia Heading;  Location: [facility];  Service: General;;  Partial Small Bowel Resection  . Givens capsule study 01/02/2011    Procedure: GIVENS CAPSULE STUDY;  Surgeon: Malissa Hippo, MD;  Location[facility];  Service: Endoscopy;  Laterality: N/A;    No family history on file.  History   Social History  . Marital Status: Married    Spouse Name: N/A    Number of Children: N/A  . Years of Education: N/A   Occupational  History  . Not on file.   Social History Main Topics  . Smoking status: Never Smoker   . Smokeless tobacco: Never Used  . Alcohol Use: No  . Drug Use: No  . Sexually Active: Not on file   Other Topics Concern  . Not on file   Social History Narrative  . No narrative on file    ROS:see history of present illness otherwise negative   PHYSICAL EXAM: Well-nournished, in no acute distress. Neck: No JVD, HJR, Bruit, or thyroid enlargement Lungs: No tachypnea, clear without wheezing, rales, or rhonchi Cardiovascular: RRR at 160 beats per minute, PMI not displaced, heart sounds normal, no murmurs, gallops, bruit, thrill, or heave. Abdomen: BS normal. Soft without organomegaly, masses, lesions or tenderness. Extremities:trace of left ankle edema, otherwise without cyanosis, clubbing . Good distal pulses bilateral SKin: Warm, no lesions or rashes  Musculoskeletal: No deformities Neuro: no focal signs  BP 124/90  Pulse 125  Resp 18  Ht 5\' 7"  (1.702 m)  Wt 235 lb (106.595 kg)  BMI 36.81 kg/m2   EKG:SVT had 160 beats per minute

## 2011-03-25 NOTE — Patient Instructions (Signed)
Report called to RN ICU.  Patient instructed on caffeine use and transferred to ICU. No acute distress noted at this time.

## 2011-03-26 ENCOUNTER — Other Ambulatory Visit: Payer: Self-pay

## 2011-03-26 ENCOUNTER — Encounter (HOSPITAL_COMMUNITY): Payer: Self-pay | Admitting: Anesthesiology

## 2011-03-26 ENCOUNTER — Inpatient Hospital Stay (HOSPITAL_COMMUNITY): Payer: Medicare Other | Admitting: Anesthesiology

## 2011-03-26 ENCOUNTER — Encounter (HOSPITAL_COMMUNITY): Payer: Self-pay | Admitting: Internal Medicine

## 2011-03-26 ENCOUNTER — Inpatient Hospital Stay (HOSPITAL_COMMUNITY): Payer: Medicare Other

## 2011-03-26 DIAGNOSIS — Z7901 Long term (current) use of anticoagulants: Secondary | ICD-10-CM

## 2011-03-26 DIAGNOSIS — I9589 Other hypotension: Secondary | ICD-10-CM

## 2011-03-26 DIAGNOSIS — I517 Cardiomegaly: Secondary | ICD-10-CM

## 2011-03-26 DIAGNOSIS — I472 Ventricular tachycardia, unspecified: Secondary | ICD-10-CM

## 2011-03-26 DIAGNOSIS — D6859 Other primary thrombophilia: Secondary | ICD-10-CM

## 2011-03-26 DIAGNOSIS — I498 Other specified cardiac arrhythmias: Secondary | ICD-10-CM

## 2011-03-26 HISTORY — DX: Ventricular tachycardia: I47.2

## 2011-03-26 HISTORY — DX: Ventricular tachycardia, unspecified: I47.20

## 2011-03-26 LAB — CARDIAC PANEL(CRET KIN+CKTOT+MB+TROPI)
CK, MB: 1.9 ng/mL (ref 0.3–4.0)
CK, MB: 1.9 ng/mL (ref 0.3–4.0)
Relative Index: 1.9 (ref 0.0–2.5)
Relative Index: 1.9 (ref 0.0–2.5)
Total CK: 102 U/L (ref 7–177)
Troponin I: <0.3 ng/mL (ref <0.30)

## 2011-03-26 LAB — CBC
HCT: 31.5 % — ABNORMAL LOW (ref 36.0–46.0)
Hemoglobin: 9.5 g/dL — ABNORMAL LOW (ref 12.0–15.0)
MCH: 24.7 pg — ABNORMAL LOW (ref 26.0–34.0)
MCV: 81.8 fL (ref 78.0–100.0)
RBC: 3.85 MIL/uL — ABNORMAL LOW (ref 3.87–5.11)

## 2011-03-26 LAB — COMPREHENSIVE METABOLIC PANEL
AST: 58 U/L — ABNORMAL HIGH (ref 0–37)
Albumin: 2.9 g/dL — ABNORMAL LOW (ref 3.5–5.2)
Calcium: 8.6 mg/dL (ref 8.4–10.5)
Creatinine, Ser: 0.61 mg/dL (ref 0.50–1.10)
Sodium: 143 mEq/L (ref 135–145)

## 2011-03-26 LAB — PROTIME-INR: Prothrombin Time: 32 seconds — ABNORMAL HIGH (ref 11.6–15.2)

## 2011-03-26 LAB — TSH: TSH: 0.728 u[IU]/mL (ref 0.350–4.500)

## 2011-03-26 MED ORDER — MORPHINE SULFATE 2 MG/ML IJ SOLN
1.0000 mg | INTRAMUSCULAR | Status: DC | PRN
  Administered 2011-03-26: 1 mg via INTRAVENOUS
  Filled 2011-03-26 (×3): qty 1

## 2011-03-26 MED ORDER — NITROGLYCERIN 0.4 MG SL SUBL
SUBLINGUAL_TABLET | SUBLINGUAL | Status: AC
  Administered 2011-03-26: 0.4 mg
  Filled 2011-03-26: qty 25

## 2011-03-26 MED ORDER — SODIUM CHLORIDE 0.9 % IV SOLN: INTRAVENOUS | Status: DC

## 2011-03-26 MED ORDER — DILTIAZEM HCL 100 MG IV SOLR
5.0000 mg/h | INTRAVENOUS | Status: DC
  Administered 2011-03-26: 5 mg/h via INTRAVENOUS
  Filled 2011-03-26: qty 100

## 2011-03-26 MED ORDER — FLECAINIDE ACETATE 50 MG PO TABS
50.0000 mg | ORAL_TABLET | Freq: Once | ORAL | Status: AC
  Administered 2011-03-26: 50 mg via ORAL
  Filled 2011-03-26: qty 1

## 2011-03-26 MED ORDER — ADENOSINE 6 MG/2ML IV SOLN
INTRAVENOUS | Status: AC
  Administered 2011-03-26: 15:00:00 12 mg
  Filled 2011-03-26: qty 4

## 2011-03-26 MED ORDER — PHENYLEPHRINE HCL 10 MG/ML IJ SOLN
30.0000 ug/min | INTRAVENOUS | Status: DC
  Administered 2011-03-26: 10 ug/min via INTRAVENOUS
  Administered 2011-03-26: 30 ug/min via INTRAVENOUS
  Administered 2011-03-26: 50 ug/min via INTRAVENOUS
  Filled 2011-03-26: qty 1

## 2011-03-26 MED ORDER — PROPOFOL 10 MG/ML IV BOLUS
INTRAVENOUS | Status: DC | PRN
  Administered 2011-03-26: 80 mg via INTRAVENOUS

## 2011-03-26 MED ORDER — NOREPINEPHRINE BITARTRATE 1 MG/ML IJ SOLN
2.0000 ug/min | INTRAMUSCULAR | Status: DC
  Filled 2011-03-26 (×2): qty 4

## 2011-03-26 MED ORDER — VERAPAMIL HCL 2.5 MG/ML IV SOLN
5.0000 mg | INTRAVENOUS | Status: AC
  Administered 2011-03-26 (×2): 5 mg via INTRAVENOUS
  Filled 2011-03-26 (×2): qty 2

## 2011-03-26 MED ORDER — FLECAINIDE ACETATE 100 MG PO TABS
100.0000 mg | ORAL_TABLET | Freq: Once | ORAL | Status: AC
  Administered 2011-03-26: 100 mg via ORAL
  Filled 2011-03-26 (×3): qty 1

## 2011-03-26 MED ORDER — MORPHINE SULFATE 2 MG/ML IJ SOLN
2.0000 mg | Freq: Once | INTRAMUSCULAR | Status: AC
  Administered 2011-03-26: 2 mg via INTRAVENOUS

## 2011-03-26 MED ORDER — WARFARIN SODIUM 6 MG PO TABS
7.0000 mg | ORAL_TABLET | Freq: Once | ORAL | Status: AC
  Filled 2011-03-26: qty 1

## 2011-03-26 MED ORDER — NITROGLYCERIN 0.4 MG SL SUBL: 0.4000 mg | SUBLINGUAL_TABLET | SUBLINGUAL | Status: DC | PRN

## 2011-03-26 NOTE — Transfer of Care (Signed)
RN at bedside, will transport patient to CICU, VSS

## 2011-03-26 NOTE — Progress Notes (Addendum)
ANTICOAGULATION CONSULT NOTE - Initial Consult  Pharmacy Consult for Coumadin Indication: History of DVT   Allergies  Allergen Reactions  . Cephalexin Swelling  . Dexlansoprazole Swelling  . Latex Itching  . Other Swelling    Patient states that Pecans cause her mouth to swell.  . Pantoprazole Sodium Swelling  . Penicillins Swelling  . Tape Itching  . Lovenox Itching, Swelling and Palpitations  . Nylon Rash  . Omeprazole Itching and Rash  . Sulfonamide Derivatives Rash    Patient Measurements: Height: 5' 7.5" (171.5 cm) Weight: 239 lb 3.2 oz (108.5 kg) IBW/kg (Calculated) : 62.75   Vital Signs: Temp: 97.6 F (36.4 C) (02/28 0800) Temp src: Oral (02/28 0800) BP: 133/55 mmHg (02/28 1530) Pulse Rate: 141  (02/28 1530)  Labs: Lab Results  Component Value Date   INR 3.05* 03/26/2011   INR 3.46* 03/25/2011   INR 2.49* 03/20/2011   Lab Results  Component Value Date   WBC 5.4 03/26/2011   HGB 9.5* 03/26/2011   HCT 31.5* 03/26/2011   MCV 81.8 03/26/2011   PLT 293 03/26/2011   .  Medical History: Past Medical History  Diagnosis Date  . Vitamin B12 deficiency     vit b12 1000 mcg monthly  . Cellulitis of left leg 2006  . Ulcer 05/2009    esophageal  . Clotting disorder     heterozygosity from factor v leiden  . Pernicious anemia 07/10/2010  . DVT (deep venous thrombosis) 07/10/2010  . Factor V Leiden   . History of blood clots     lower legs  . Blood dyscrasia   . Anxiety   . Small bowel mass 01/03/2011  . Allergic urticaria 01/04/2011    Rash from tape.  Marland Kitchen GERD (gastroesophageal reflux disease)     Medications:  Scheduled:     . adenosine      . ALPRAZolam  0.5 mg Oral Daily  . famotidine  20 mg Oral QHS  . folic acid  1 mg Oral Daily  . iron polysaccharides  150 mg Oral BH-q7a  .  morphine injection  2 mg Intravenous Once  . nitroGLYCERIN      . pantoprazole sodium  20 mg Oral Q1200  . sodium chloride  3 mL Intravenous Q12H  . verapamil  5 mg  Intravenous Q5 min  . Warfarin - Pharmacist Dosing Inpatient   Does not apply q1800  . DISCONTD: adenosine (ADENOCARD) IV  18 mg Intravenous Once  . DISCONTD: carvedilol  6.25 mg Oral BID WC  . DISCONTD: imatinib  400 mg Oral Q breakfast  . DISCONTD: warfarin  2 mg Oral Daily  . DISCONTD: warfarin  5 mg Oral Daily    Assessment: The patient is a 61 yo female with h/o DVT (06/2010) , Factor V Leiden deficiency and malignant gastrointestinal stromal tumor on lifelong coumadin. Home dose was 8 mg every other days alternating with 7mg  every other day last taken 03/25/11 prior to admission. Coumadin dose held last night due to INR 3.46 on admission to [facility]. Today INR = 3.05 , now within therapeutic range.  Hgb 9.5, pltc 293K. No bleeding reported.   Goal of Therapy:  INR 2-3   Plan:  Give Coumadin 7 mg today.  INR/PT daily Monitor CBC  Arman Filter 03/26/2011,4:21 PM

## 2011-03-26 NOTE — Progress Notes (Signed)
Called to pt's room by husband because pt "feels like she is going to pass out."  Pt's current blood pressure is 72/56.  Turned off Cardizem gtt immediately and started NS bolus.  Paged Dr. Karilyn Cota and he advised to call Dr. Marvel Plan office.  Pt began complaining of chest pain (felt as if "something is sitting on chest"); placed on Russell@3L , and reclined pt in chair.  Pt began complaining of nausea and had two episode of projectile vomiting.  Morphine 1mg  IV given at 9:20.  Called Dr. Dietrich Pates and explained situation and he came to bedside to assess situation.  Pt's BP now 95/67, gave an additional Morphine 1mg  IV at 9:25 as ordered by Dr. Karilyn Cota.  At 9:30 pt noted that chest pain was completely gone.  Plan is to transfer pt to [facility] cath lab.  Dr. Dietrich Pates will make arrangements.  Will continue to monitor pt frequently and closely.

## 2011-03-26 NOTE — Significant Event (Signed)
Dr. Johney Frame came to assess patient. We administered 12mg  Adenosine without a response. About 45 min later we administered a total of 10mg  Verapamil, apart. The patient experienced hypotension, with no relief of the tachycardia. We placed zoll pads on patient. She became even more hypotensive and we called rapid response. We gave a total of 1.5L NS bolus, began a dopamine ggt, and a neo gtt. The patient became nauseated and we turned her on her side and she vomited, no indication of aspiration at the time. Anesthesia was called and they placed another IV and the MD pushed Propofol and we cardioverted the patient 3 times at 200J each. She dropped her O2 sats to the 80s and we put the bag-valv mask over her and when the sats came up, we put a NRB on the patient. Her tachycardia did not respond. Rapid Response and I transferred the patient to 2900 for further observation. Report was given at bedside, and the patient was stable at the time of transfer.   Milinda Cave, RN, BSN

## 2011-03-26 NOTE — Consult Note (Addendum)
[name]  61 y.o.  female  Subjective: Patient did fairly well overnight, but again became hypotensive following treatment with intravenous diltiazem, this time accompanied by nausea, emesis and severe ischemic-type chest discomfort. She improved with fluids, assuming the supine position, discontinuation of diltiazem and administration of morphine.  Allergy: Cephalexin; Dexlansoprazole; Latex; Other; Pantoprazole sodium; Penicillins; Tape; Lovenox; Nylon; Omeprazole; and Sulfonamide derivatives  Objective: Vital signs in last 24 hours: Temp:  [97.6 F (36.4 C)-98.2 F (36.8 C)] 98.2 F (36.8 C) (02/28 0400) Pulse Rate:  [54-162] 59  (02/28 0700) Resp:  [17-27] 20  (02/28 0700) BP: (88-128)/(60-90) 121/89 mmHg (02/28 0700) SpO2:  [91 %-100 %] 93 % (02/28 0700) Weight:  [106.595 kg (235 lb)-108.5 kg (239 lb 3.2 oz)] 108.5 kg (239 lb 3.2 oz) (02/28 0500)  108.5 kg (239 lb 3.2 oz) Body mass index is 36.91 kg/(m^2).  Weight change:  Last BM Date: 03/24/11  Intake/Output from previous day: 02/27 0701 - 02/28 0700 In: 718.9 [P.O.:360; I.V.:358.9] Out: -   General- Well developed; pale and washed out appearing Neck- No JVD, no carotid bruits Lungs- clear lung fields; normal I:E ratio Cardiovascular- normal PMI; normal S1 and S2; minimal systolic murmur Abdomen- normal bowel sounds; soft and non-tender without masses or organomegaly Skin- Warm, no significant lesions Extremities- Nl distal pulses; no edema  Lab Results: Cardiac Markers:   Basename 03/26/11 0058 03/25/11 1720  TROPONINI <0.30 <0.30   CBC:   Basename 03/26/11 0429 03/25/11 0911  WBC 5.4 5.4  HGB 9.5* 10.9*  HCT 31.5* 35.8*  PLT 293 366   BMET:  Basename 03/26/11 0429 03/25/11 0911  NA 143 142  K 3.8 4.0  CL 113* 110  CO2 23 23  GLUCOSE 110* 112*  BUN 10 11  CREATININE 0.61 0.71  CALCIUM 8.6 9.0   Hepatic Function:   Basename 03/26/11 0429  PROT 6.3  ALBUMIN 2.9*  AST 58*  ALT 49*  ALKPHOS 110   BILITOT 0.2*  BILIDIR --  IBILI --   EKG:  Tracings reviewed with Dr. Ladona Ridgel. He raises the possibility of a ventricular tachycardia although QRS interval is extremely narrow for that diagnosis.   Imaging Studies/Results: Dg Chest 1 View  03/25/2011  *RADIOLOGY REPORT*  Clinical Data: 61 year old female with shortness of breath.  CHEST - 1 VIEW  Comparison: 01/06/2011 and earlier.  Findings: Portable semi upright AP view 1720 hours.  Chronically low lung volumes.  Stable cardiac size and mediastinal contours. No pneumothorax, pulmonary edema, pleural effusion or consolidation. Visualized tracheal air column is within normal limits.  IMPRESSION: No acute cardiopulmonary abnormality.  Original Report Authenticated By: Harley Hallmark, M.D.   Imaging: Imaging results have been reviewed  Medications:  I have reviewed the patient's current medications. Scheduled:   . ALPRAZolam  0.5 mg Oral Daily  . carvedilol  6.25 mg Oral BID WC  . famotidine  20 mg Oral QHS  . folic acid  1 mg Oral Daily  . iron polysaccharides  150 mg Oral BH-q7a  . nitroGLYCERIN      . pantoprazole sodium  20 mg Oral Q1200  . sodium chloride  3 mL Intravenous Q12H  . Warfarin - Pharmacist Dosing Inpatient   Does not apply q1800    Active Problems:  Factor V Leiden  Chronic anticoagulation  GIST (gastrointestinal stromal tumor), malignant  SVT (supraventricular tachycardia)   Assessment/Plan: Case discussed with Dr. Ladona Ridgel. Patient has an undiagnosed tachycardia that she tolerates well, but has not  tolerated treatment with medications that tend to lower the pressure were depressed left ventricular systolic function.   This clearly does not appear to be a run-of-the-mill supraventricular tachycardia.   Her situation is complicated by left ventricular dysfunction, which could be tachycardia-mediated, artifactually due to imaging during tachycardia or an underlying cardiomyopathy.   She will be transfer to [facility] for further evaluation by our electrophysiology service.  35 minutes spent at bedside administering fluid, evaluating patient, obtaining an evaluating test data including an EKG and discussing case with Dr. Ladona Ridgel of our electrophysiology service.   LOS: 1 day   [place] Bing 03/26/2011, 9:39 AM

## 2011-03-26 NOTE — Anesthesia Preprocedure Evaluation (Signed)
Anesthesia Evaluation  Patient identified by MRN, date of birth, ID band Patient awake    Reviewed: Allergy & Precautions, H&P , NPO status , Patient's Chart, lab work & pertinent test results  Airway Mallampati: II TM Distance: >3 FB     Dental  (+) Teeth Intact   Pulmonary          Cardiovascular Irregular Tachycardia    Neuro/Psych    GI/Hepatic   Endo/Other  Morbid obesity  Renal/GU      Musculoskeletal   Abdominal   Peds  Hematology   Anesthesia Other Findings   Reproductive/Obstetrics                           Anesthesia Physical Anesthesia Plan  ASA: IV  Anesthesia Plan: General   Post-op Pain Management:    Induction: Intravenous  Airway Management Planned: Mask  Additional Equipment:   Intra-op Plan:   Post-operative Plan:   Informed Consent: I have reviewed the patients History and Physical, chart, labs and discussed the procedure including the risks, benefits and alternatives for the proposed anesthesia with the patient or authorized representative who has indicated his/her understanding and acceptance.     Plan Discussed with: CRNA and Anesthesiologist  Anesthesia Plan Comments:         Anesthesia Quick Evaluation

## 2011-03-26 NOTE — Progress Notes (Signed)
*  PRELIMINARY RESULTS* Echocardiogram 2D Echocardiogram has been performed.  Conrad  03/26/2011, 12:01 PM

## 2011-03-26 NOTE — Progress Notes (Signed)
Patient complained of chest pressure. Order obtained from MD for Sublingual nitro and IV morphine. Cardiac Enzymes negative. HR remains 140s-150s. Will continue to monitor closely

## 2011-03-26 NOTE — Progress Notes (Addendum)
Subjective: This lady has been in tachycardia all night long. Coreg had been started orally by Dr. Dietrich Pates but this is really not in any significant difference to her heart rate. She feels unwell with it. Fortunately, she is not hypotensive now. Serial cardiac enzymes are negative so far. D-dimer is elevated.           Physical Exam: Blood pressure 121/89, pulse 59, temperature 98.2 F (36.8 C), temperature source Oral, resp. rate 20, height 5' 7.5" (1.715 m), weight 108.5 kg (239 lb 3.2 oz), SpO2 93.00%. She does look systemically well. He continues to have a tachycardia in the 150 range. It is regular. Lung fields are clear. She is alert and orientated.   Investigations:  Recent Results (from the past 240 hour(s))  MRSA PCR SCREENING     Status: Normal   Collection Time   03/25/11  5:05 PM      Component Value Range Status Comment   MRSA by PCR NEGATIVE  NEGATIVE  Final      Basic Metabolic Panel:  Basename 03/26/11 0429 03/25/11 0911  NA 143 142  K 3.8 4.0  CL 113* 110  CO2 23 23  GLUCOSE 110* 112*  BUN 10 11  CREATININE 0.61 0.71  CALCIUM 8.6 9.0  MG -- --  PHOS -- --   Liver Function Tests:  [facility] 03/26/11 0429  AST 58*  ALT 49*  ALKPHOS 110  BILITOT 0.2*  PROT 6.3  ALBUMIN 2.9*     CBC:  Basename 03/26/11 0429 03/25/11 0911  WBC 5.4 5.4  NEUTROABS -- 2.3  HGB 9.5* 10.9*  HCT 31.5* 35.8*  MCV 81.8 81.2  PLT 293 366    Dg Chest 1 View  03/25/2011  *RADIOLOGY REPORT*  Clinical Data: 61 year old female with shortness of breath.  CHEST - 1 VIEW  Comparison: 01/06/2011 and earlier.  Findings: Portable semi upright AP view 1720 hours.  Chronically low lung volumes.  Stable cardiac size and mediastinal contours. No pneumothorax, pulmonary edema, pleural effusion or consolidation. Visualized tracheal air column is within normal limits.  IMPRESSION: No acute cardiopulmonary abnormality.  Original Report Authenticated By: Harley Hallmark, M.D.       Medications: I have reviewed the patient's current medications.  Impression: 1. SVT. 2. Gastrointestinal stromal tumor. 3. Factor V deficiency. 4. Chronic anticoagulation with Coumadin with therapeutic INR of 3.05.     Plan: 1. Start Cardizem drip. 2. When ventricular rate is better controlled, she will need a CT and determine the chest to make sure she does not have pulmonary embolism. She is already anticoagulated therapeutically.   Addendum: Patient was given intravenous Cardizem but unfortunately had adverse reaction to it, becoming hypotensive associated with ischemic chest pain. The patient was given intravenous morphine together with normal saline fluid bolus. She has been seen by Dr. Dietrich Pates , cardiology, who has spoken to Dr. Ladona Ridgel,  electrophysiology and the decision has been made to transfer this patient to [facility] for further management. I agree with this.    LOS: 1 day   Wilson Singer Pager [phone]  03/26/2011, 7:57 AM

## 2011-03-26 NOTE — Progress Notes (Signed)
No more c/o chest pressure at this time. Patient is asleep and resting comfortably in bed

## 2011-03-26 NOTE — Significant Event (Signed)
Rapid Response Event Note  Overview: Time Called: 1650 Arrival Time: 1652 Event Type: Cardiac;Hypotension  Initial Focused Assessment/ Intervention: Dr Johney Frame and Pamelia Hoit PA at bedside. Patient in rapid HR with pulse, per MD VT  Rate 130-160s SBP 80s-140s, NS bolus Dopamine gtt infusing, changed to Neo gtt @50mcg . Patient A&O Patient vomiting bile, Zofran given IV Anesthesia called to assist with cardioversion. 2nd PIV placed in Right AC, 18ga. Cardioverted x 3 at 200J,  HR 130s. Placed on 100% NRB post cardioversion, because pt remains mildly sedated with decreased sats. Patient transported to 2907 via bed with O2 and Zoll.  RN at bedside, received report at bedside from department RN.    Event Summary: Name of Physician Notified: Dr Johney Frame at  (at bedside)    at    Outcome: Transferred (Comment) ([id number])  Event End Time: 1800  Marcellina Millin

## 2011-03-26 NOTE — Consult Note (Signed)
ELECTROPHYSIOLOGY CONSULT NOTE  Primary Care Physician: Rudi Heap, MD, MD Referring Physician:  Dr Dietrich Pates  Admit Date: 03/25/2011  Reason for consultation:  Tachycardia  [name] is a 61 y.o. female with a h/o factor V Leyden deficiency and h/o DVT as well as recent surgical resection for a small bowel mass who presents on transfer from [facility] with sustained tachycardia.  The patient reports that over the past 2 days she has had symptoms of fatigue and tachypalpitations.  She was evaluated by Dr Dietrich Pates and noted to have sustained rather narrow complex tachycardia.  This tachycardia did not terminate with adenosine, metoprolol, or diltiazem.  She is therefore transferred to [facility] for further management.  Today, she denies symptoms of chest pain, shortness of breath, orthopnea, PND, lower extremity edema, dizziness, presyncope, syncope, or neurologic sequela. The patient is tolerating medications without difficulties and is otherwise without complaint today.   Past Medical History  Diagnosis Date  . Vitamin B12 deficiency     vit b12 1000 mcg monthly  . Cellulitis of left leg 2006  . Ulcer 05/2009    esophageal  . Clotting disorder     heterozygosity from factor v leiden  . Pernicious anemia 07/10/2010  . DVT (deep venous thrombosis) 07/10/2010    on coumadin  . Factor V Leiden   . Anxiety   . 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 Allergic urticaria 01/04/2011    Rash from tape.  Marland Kitchen GERD (gastroesophageal reflux disease)   . Ventricular tachycardia 03/26/11   Past Surgical History  Procedure Date  . Abdominal hysterectomy 1989  . Balloon dilation 12/11/2010    Procedure: BALLOON DILATION;  Surgeon: Malissa Hippo, MD;  Location: [facility] ENDO SUITE;  Service: Endoscopy;  Laterality: N/A;  . Laparotomy 01/05/2011    Procedure: EXPLORATORY LAPAROTOMY;  Surgeon: Dalia Heading;  Location: [facility] ORS;  Service: General;  Laterality: N/A;  . Bowel resection 01/05/2011      Procedure: SMALL BOWEL RESECTION;  Surgeon: Dalia Heading;  Location: [facility] ORS;  Service: General;;  Partial Small Bowel Resection  . Givens capsule study 01/02/2011    Procedure: GIVENS CAPSULE STUDY;  Surgeon: Malissa Hippo, MD;  Location: [facility] ENDO SUITE;  Service: Endoscopy;  Laterality: N/A;       . adenosine      . ALPRAZolam  0.5 mg Oral Daily  . famotidine  20 mg Oral QHS  . flecainide  100 mg Oral Once  . folic acid  1 mg Oral Daily  . iron polysaccharides  150 mg Oral BH-q7a  .  morphine injection  2 mg Intravenous Once  . nitroGLYCERIN      . pantoprazole sodium  20 mg Oral Q1200  . sodium chloride  3 mL Intravenous Q12H  . verapamil  5 mg Intravenous Q5 min  . warfarin  7 mg Oral ONCE-1800  . Warfarin - Pharmacist Dosing Inpatient   Does not apply q1800  . DISCONTD: adenosine (ADENOCARD) IV  18 mg Intravenous Once  . DISCONTD: carvedilol  6.25 mg Oral BID WC  . DISCONTD: imatinib  400 mg Oral Q breakfast  . DISCONTD: warfarin  2 mg Oral Daily  . DISCONTD: warfarin  5 mg Oral Daily      . sodium chloride 20 mL/hr at 03/26/11 1400  . norepinephrine (LEVOPHED) Adult infusion    . phenylephrine (NEO-SYNEPHRINE) Adult infusion 50 mcg/min (03/26/11 1715)  . DISCONTD: diltiazem (CARDIZEM) infusion  Stopped (03/26/11 1610)    Allergies  Allergen Reactions  . Cephalexin Swelling  . Dexlansoprazole Swelling  . Latex Itching  . Other Swelling    Patient states that Pecans cause her mouth to swell.  . Pantoprazole Sodium Swelling  . Penicillins Swelling  . Tape Itching  . Lovenox Itching, Swelling and Palpitations  . Nylon Rash  . Omeprazole Itching and Rash  . Sulfonamide Derivatives Rash    History   Social History  . Marital Status: Married    Spouse Name: N/A    Number of Children: N/A  . Years of Education: N/A   Occupational History  . Not on file.   Social History Main Topics  . Smoking status: Never Smoker   . Smokeless tobacco: Never Used  .  Alcohol Use: No  . Drug Use: No  . Sexually Active: Not Currently   Other Topics Concern  . Not on file   Social History Narrative  . No narrative on file   ROS- All systems are reviewed and negative except as per the HPI above  Physical Exam: Telemetry: Filed Vitals:   03/26/11 1330 03/26/11 1530 03/26/11 1600 03/26/11 1730  BP: 123/83 133/55 100/81 103/82  Pulse: 140 141 160 135  Temp:   98.2 F (36.8 C)   TempSrc:   Oral   Resp: 23 21 22 20   Height:      Weight:      SpO2: 99% 97% 94% 100%    GEN- The patient is well appearing, alert and oriented x 3 today.   Head- normocephalic, atraumatic Eyes-  Sclera clear, conjunctiva pink Ears- hearing intact Oropharynx- clear Neck- supple, no JVP Lymph- no cervical lymphadenopathy Lungs- Clear to ausculation bilaterally, normal work of breathing Heart- Tachy regular rhythm, no murmurs, rubs or gallops, PMI not laterally displaced GI- soft, NT, ND, + BS Extremities- no clubbing, cyanosis, + dependant edema with venous stasis changes MS- no significant deformity or atrophy Skin- no rash or lesion Psych- euthymic mood, full affect Neuro- strength and sensation are intact  EKG-  Ventricular tachycardia with RBBB L inferior QRS axis, QRS duration is  Labs:   Lab Results  Component Value Date   WBC 5.4 03/26/2011   HGB 9.5* 03/26/2011   HCT 31.5* 03/26/2011   MCV 81.8 03/26/2011   PLT 293 03/26/2011    Lab 03/26/11 0429  NA 143  K 3.8  CL 113*  CO2 23  BUN 10  CREATININE 0.61  CALCIUM 8.6  PROT 6.3  BILITOT 0.2*  ALKPHOS 110  ALT 49*  AST 58*  GLUCOSE 110*   Denise:  LV EF 50-55% with HK of the midanteroseptal myocardium, mild RV dilatation  ASSESSMENT AND PLAN:  1.  Sustained narrow complex tachycardia I have reviewed multiple EKG rhythm strips and discussed with Dr Ladona Ridgel and Dietrich Pates.  There are several episodes during which there is VA dissociation suggesting that the mechanism for this tachycardia is  VT rather than SVT. I have given adenosine 12mg  which produced flushing and symptoms of the medication but failed to terminate tachycardia or even demonstrate transient AV block. Hoping that this very narrow tachycardia might be verapamil sensitive, I administered 10mg  of verapamil over a 15 minute period.  This resulted in hypotension requiring IV Neosynephrine for pressure support but did not terminate tachycardia.  With the assistance of anesthesia, the patient was appropriately sedated with IV propofol and 3 synchronized 200J biphasic shocks were each unsuccessful in terminating tachycardia with  cardioversion electrodes in the anterior/ posterior configuration.  As her tachycardia remains incessant, I will move her to the CCU for close overnight observation. Dr Ladona Ridgel and I have discussed her case at length. My concern is that this tachycardia likely arises from a location in the interventricular septum very near the normal conduction system.  I suspect that risks for AV block may be rather high.  I would therefore try flecainide PO overnight to see if we can control this tachycardia with medications.  If her tachycardia persists despite flecainide, then we will plan EP study and catheter ablation tomorrow with the understanding that risks for requiring permanent pacing may be high.  Critical care time spent with patient > 1 hour  Hillis Range, MD 03/26/2011  5:59 PM

## 2011-03-26 NOTE — Progress Notes (Signed)
Called report to Merdith, RN on Dept 6500 at [facility].  [facility] is here and ready to transport pt to [facility].  Pt will be transferred to room 6533.  Pt is alert and oriented; heart rate has been consistently 140's however, pt is not symptomatic at all.  Pt's husband had taken all of pt's belongings with him and will follow [facility] over to [facility].

## 2011-03-27 ENCOUNTER — Ambulatory Visit (HOSPITAL_COMMUNITY): Payer: Medicare Other | Admitting: Oncology

## 2011-03-27 ENCOUNTER — Encounter (HOSPITAL_COMMUNITY)
Admission: AD | Disposition: A | Discharge status: Home / Self Care | Payer: Self-pay | Source: Ambulatory Visit | Attending: Internal Medicine

## 2011-03-27 LAB — BASIC METABOLIC PANEL
CO2: 23 mEq/L (ref 19–32)
Chloride: 113 mEq/L — ABNORMAL HIGH (ref 96–112)
Creatinine, Ser: 0.62 mg/dL (ref 0.50–1.10)
Glucose, Bld: 100 mg/dL — ABNORMAL HIGH (ref 70–99)

## 2011-03-27 LAB — CBC
HCT: 31 % — ABNORMAL LOW (ref 36.0–46.0)
MCH: 24.2 pg — ABNORMAL LOW (ref 26.0–34.0)
MCV: 81.6 fL (ref 78.0–100.0)
RDW: 19.4 % — ABNORMAL HIGH (ref 11.5–15.5)
WBC: 6.4 10*3/uL (ref 4.0–10.5)

## 2011-03-27 SURGERY — V-TACH ABLATION
Anesthesia: LOCAL

## 2011-03-27 MED ORDER — FLECAINIDE ACETATE 100 MG PO TABS
100.0000 mg | ORAL_TABLET | ORAL | Status: AC
  Administered 2011-03-27: 100 mg via ORAL
  Filled 2011-03-27: qty 1

## 2011-03-27 MED ORDER — WARFARIN SODIUM 2.5 MG PO TABS
12.5000 mg | ORAL_TABLET | Freq: Once | ORAL | Status: AC
  Administered 2011-03-27: 12.5 mg via ORAL
  Filled 2011-03-27: qty 1

## 2011-03-27 MED ORDER — PANTOPRAZOLE SODIUM 40 MG PO TBEC
40.0000 mg | DELAYED_RELEASE_TABLET | Freq: Every day | ORAL | Status: DC
  Administered 2011-03-27 – 2011-03-29 (×3): 40 mg via ORAL
  Filled 2011-03-27 (×3): qty 1

## 2011-03-27 MED ORDER — FLECAINIDE ACETATE 100 MG PO TABS
100.0000 mg | ORAL_TABLET | Freq: Two times a day (BID) | ORAL | Status: DC
  Administered 2011-03-27 – 2011-03-30 (×6): 100 mg via ORAL
  Filled 2011-03-27 (×9): qty 1

## 2011-03-27 NOTE — Progress Notes (Signed)
ANTICOAGULATION CONSULT NOTE -Follow Up Consult  Pharmacy Consult for Coumadin Indication: History of DVT   Allergies  Allergen Reactions  . Cephalexin Swelling  . Dexlansoprazole Swelling  . Latex Itching  . Other Swelling    Patient states that Pecans cause her mouth to swell.  . Pantoprazole Sodium Swelling  . Penicillins Swelling  . Tape Itching  . Lovenox Itching, Swelling and Palpitations  . Nylon Rash  . Omeprazole Itching and Rash  . Sulfonamide Derivatives Rash    Patient Measurements: Height: 5\' 7"  (170.2 cm) Weight: 238 lb 1.6 oz (108 kg) IBW/kg (Calculated) : 61.6   Vital Signs: Temp: 98.2 F (36.8 C) (03/01 0826) Temp src: Oral (03/01 0826) BP: 118/73 mmHg (03/01 0700) Pulse Rate: 108  (03/01 0700)  Labs: Lab Results  Component Value Date   INR 1.85* 03/27/2011   INR 3.05* 03/26/2011   INR 3.46* 03/25/2011   Lab Results  Component Value Date   WBC 6.4 03/27/2011   HGB 9.2* 03/27/2011   HCT 31.0* 03/27/2011   MCV 81.6 03/27/2011   PLT 290 03/27/2011   .  Medical History: Past Medical History  Diagnosis Date  . Vitamin B12 deficiency     vit b12 1000 mcg monthly  . Cellulitis of left leg 2006  . Ulcer 05/2009    esophageal  . Clotting disorder     heterozygosity from factor v leiden  . Pernicious anemia 07/10/2010  . DVT (deep venous thrombosis) 07/10/2010    on coumadin  . Factor V Leiden   . Anxiety   . 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 Allergic urticaria 01/04/2011    Rash from tape.  Marland Kitchen GERD (gastroesophageal reflux disease)   . Ventricular tachycardia 03/26/11    Medications:  Scheduled:     . adenosine      . ALPRAZolam  0.5 mg Oral Daily  . famotidine  20 mg Oral QHS  . flecainide  100 mg Oral Once  . flecainide  100 mg Oral NOW  . flecainide  50 mg Oral Once  . folic acid  1 mg Oral Daily  . iron polysaccharides  150 mg Oral BH-q7a  . pantoprazole  40 mg Oral Q1200  . sodium chloride  3 mL Intravenous Q12H  . verapamil   5 mg Intravenous Q5 min  . warfarin  7 mg Oral ONCE-1800  . Warfarin - Pharmacist Dosing Inpatient   Does not apply q1800  . DISCONTD: carvedilol  6.25 mg Oral BID WC  . DISCONTD: pantoprazole sodium  20 mg Oral Q1200    Admit Complaint: The patient is a 61 yo female with h/o DVT (06/2010) , Factor V Leiden deficiency and malignant gastrointestinal stromal tumor on lifelong coumadin, admitted 03/25/11 with palpitations.  Pharmacy consulted to continue warfarin  Home dose was 8 mg every other days alternating with 7mg  every other day last taken 03/25/11 prior to admission.   Assessment: Anticoagulation: DVT, Factor V Leiden deficiency,  INR dropped below goal despite continuing home dose, significant to note that last nights dose was not charted.  Cardiovascular: new VT, did not convert with DCCV.  Flecinide initiated 2/28.  K 3.6, no magnesium level Endocrinology Gastrointestinal / Nutrition: Esophageal ulcer, recent surgical resection of small bowel mass, on protonix, and pepcid as PTA, despite documented allergies. Neurology: Anxiety, xanax Hematology / Oncology: Gastrointestinal Stromal Tumor (GIST) anemia: Niferex, folic acid, H/H trending down. PTA Medication Issues: Home medications not ordered: Gleevec, Vitamin  B12  Best Practices  Goal of Therapy:  INR 2-3   Plan:  Warfarin 12.5 mg x 1 tonight, given missed dose yesterday INR/PT daily Consider resuming home gleevec when clinically appropriate, but noted this medication is associated with a <5% risk of palpitations. Monitor CBC, K and Magnesium  Lovenia Kim Pharm.D., BCPS Clinical Pharmacist 03/27/2011 10:25 AM Pager: [phone] Phone: [phone]    Yazlyn Wentzel Merrily Brittle 03/27/2011,10:06 AM

## 2011-03-27 NOTE — Progress Notes (Signed)
Patient ID: TRISSA MOLINA, female   DOB: 12-04-1950, 61 y.o.   MRN: [id number] Subjective:  Feels better.  Objective:  Vital Signs in the last 24 hours: Temp:  [97.9 F (36.6 C)-98.7 F (37.1 C)] 97.9 F (36.6 C) (03/01 1142) Pulse Rate:  [35-165] 74  (03/01 1142) Resp:  [10-28] 22  (03/01 1000) BP: (45-144)/(20-99) 117/74 mmHg (03/01 1109) SpO2:  [92 %-100 %] 99 % (03/01 1142) FiO2 (%):  [50 %-100 %] 50 % (02/28 1800) Weight:  [108 kg (238 lb 1.6 oz)] 108 kg (238 lb 1.6 oz) (02/28 1800)  Intake/Output from previous day: 02/28 0701 - 03/01 0700 In: 1572.5 [P.O.:160; I.V.:1412.5] Out: 800 [Urine:750; Emesis/NG output:50] Intake/Output from this shift: Total I/O In: 210 [P.O.:180; I.V.:30] Out: 200 [Urine:200]  Physical Exam: Well appearing NAD HEENT: Unremarkable Neck:  No JVD, no thyromegally Lymphatics:  No adenopathy Back:  No CVA tenderness Lungs:  Clear with no wheezes or rhonchi HEART:  Regular rate rhythm, no murmurs, no rubs, no clicks Abd:  Flat, positive bowel sounds, no organomegally, no rebound, no guarding Ext:  2 plus pulses, no edema, no cyanosis, no clubbing Skin:  No rashes no nodules Neuro:  CN II through XII intact, motor grossly intact  Lab Results:  Basename 03/27/11 0625 03/26/11 0429  WBC 6.4 5.4  HGB 9.2* 9.5*  PLT 290 293    Basename 03/27/11 0625 03/26/11 0429  NA 142 143  K 3.6 3.8  CL 113* 113*  CO2 23 23  GLUCOSE 100* 110*  BUN 8 10  CREATININE 0.62 0.61    Basename 03/26/11 0903 03/26/11 0058  TROPONINI <0.30 <0.30   Hepatic Function Panel  Basename 03/26/11 0429  PROT 6.3  ALBUMIN 2.9*  AST 58*  ALT 49*  ALKPHOS 110  BILITOT 0.2*  BILIDIR --  IBILI --   No results found for this basename: CHOL in the last 72 hours No results found for this basename: PROTIME in the last 72 hours  Imaging: Dg Chest 1 View  03/25/2011  *RADIOLOGY REPORT*  Clinical Data: 61 year old female with shortness of breath.  CHEST - 1 VIEW   Comparison: 01/06/2011 and earlier.  Findings: Portable semi upright AP view 1720 hours.  Chronically low lung volumes.  Stable cardiac size and mediastinal contours. No pneumothorax, pulmonary edema, pleural effusion or consolidation. Visualized tracheal air column is within normal limits.  IMPRESSION: No acute cardiopulmonary abnormality.  Original Report Authenticated By: Harley Hallmark, M.D.    Cardiac Studies: Tele - VT now NSR Assessment/Plan:  1. VT her narrow qrs VT has resolved on Flecainide. Will plan to continue at 100 mg daily but will need to watch dose and if she has bradycardia or QRS widening, consider reducing to 75 mg twice daily. Ok to transfer to a tele bed on 3/2 and would watch until p.m. On 3/3 or a.m. Of 3/4. She will need outpatient regular stress test next week in [place].  LOS: 2 days    Lewayne Bunting 03/27/2011, 11:57 AM

## 2011-03-27 NOTE — Progress Notes (Signed)
UR Completed. Simmons, Nyeema Want F [phone]  

## 2011-03-28 LAB — COMPREHENSIVE METABOLIC PANEL
AST: 27 U/L (ref 0–37)
Albumin: 3 g/dL — ABNORMAL LOW (ref 3.5–5.2)
Alkaline Phosphatase: 116 U/L (ref 39–117)
Chloride: 109 mEq/L (ref 96–112)
Potassium: 3.8 mEq/L (ref 3.5–5.1)
Total Bilirubin: 0.3 mg/dL (ref 0.3–1.2)

## 2011-03-28 LAB — PROTIME-INR
INR: 1.56 — ABNORMAL HIGH (ref 0.00–1.49)
Prothrombin Time: 19 seconds — ABNORMAL HIGH (ref 11.6–15.2)

## 2011-03-28 MED ORDER — HEPARIN (PORCINE) IN NACL 100-0.45 UNIT/ML-% IJ SOLN
1600.0000 [IU]/h | INTRAMUSCULAR | Status: DC
  Administered 2011-03-28: 1400 [IU]/h via INTRAVENOUS
  Administered 2011-03-28 – 2011-03-30 (×3): 1600 [IU]/h via INTRAVENOUS
  Filled 2011-03-28 (×6): qty 250

## 2011-03-28 MED ORDER — METOPROLOL SUCCINATE ER 25 MG PO TB24
25.0000 mg | ORAL_TABLET | Freq: Every day | ORAL | Status: DC
  Administered 2011-03-28: 25 mg via ORAL
  Filled 2011-03-28 (×2): qty 1

## 2011-03-28 MED ORDER — HEPARIN BOLUS VIA INFUSION
2000.0000 [IU] | Freq: Once | INTRAVENOUS | Status: AC
Start: 2011-03-28 — End: 2011-03-28
  Administered 2011-03-28: 2000 [IU] via INTRAVENOUS
  Filled 2011-03-28: qty 2000

## 2011-03-28 MED ORDER — WARFARIN SODIUM 2.5 MG PO TABS
12.5000 mg | ORAL_TABLET | Freq: Once | ORAL | Status: AC
  Administered 2011-03-28: 12.5 mg via ORAL
  Filled 2011-03-28: qty 1

## 2011-03-28 NOTE — Progress Notes (Addendum)
Pt admitted with incessant VT, now improved with flecainide  SUBJECTIVE: The patient is doing well today.  At this time, she denies chest pain, shortness of breath, or any new concerns.     . ALPRAZolam  0.5 mg Oral Daily  . famotidine  20 mg Oral QHS  . flecainide  100 mg Oral Q12H  . folic acid  1 mg Oral Daily  . iron polysaccharides  150 mg Oral BH-q7a  . metoprolol succinate  25 mg Oral Daily  . pantoprazole  40 mg Oral Q1200  . sodium chloride  3 mL Intravenous Q12H  . warfarin  12.5 mg Oral ONCE-1800  . warfarin  7 mg Oral ONCE-1800  . Warfarin - Pharmacist Dosing Inpatient   Does not apply q1800  . DISCONTD: pantoprazole sodium  20 mg Oral Q1200      . sodium chloride 10 mL/hr at 03/27/11 2003    OBJECTIVE: Physical Exam: Filed Vitals:   03/28/11 0500 03/28/11 0600 03/28/11 0700 03/28/11 0800  BP: 134/57 145/63 123/59 110/75  Pulse: 83 85 68 86  Temp:   98.6 F (37 C)   TempSrc:   Oral   Resp: 24 21 21 20   Height:      Weight:      SpO2: 95% 95% 93% 96%    Intake/Output Summary (Last 24 hours) at 03/28/11 0901 Last data filed at 03/28/11 0800  Gross per 24 hour  Intake   1210 ml  Output    500 ml  Net    710 ml    Telemetry reveals sinus rhythm with frequent PVCs  GEN- The patient is well appearing, alert and oriented x 3 today.   Head- normocephalic, atraumatic Eyes-  Sclera clear, conjunctiva pink Ears- hearing intact Oropharynx- clear Neck- supple, no JVP Lymph- no cervical lymphadenopathy Lungs- Clear to ausculation bilaterally, normal work of breathing Heart- Regular rate and rhythm, no murmurs, rubs or gallops, PMI not laterally displaced GI- soft, NT, ND, + BS Extremities- no clubbing, cyanosis, or edema Skin- no rash or lesion Psych- euthymic mood, full affect Neuro- strength and sensation are intact  LABS: Basic Metabolic Panel:  Basename 03/28/11 0630 03/27/11 0625  NA 140 142  K 3.8 3.6  CL 109 113*  CO2 23 23  GLUCOSE 110*  100*  BUN 8 8  CREATININE 0.61 0.62  CALCIUM 8.7 8.6  MG -- --  PHOS -- --   Liver Function Tests:  Basename 03/28/11 0630 03/26/11 0429  AST 27 58*  ALT 37* 49*  ALKPHOS 116 110  BILITOT 0.3 0.2*  PROT 6.5 6.3  ALBUMIN 3.0* 2.9*   No results found for this basename: LIPASE:2,AMYLASE:2 in the last 72 hours CBC:  Basename 03/27/11 0625 03/26/11 0429 03/25/11 0911  WBC 6.4 5.4 --  NEUTROABS -- -- 2.3  HGB 9.2* 9.5* --  HCT 31.0* 31.5* --  MCV 81.6 81.8 --  PLT 290 293 --   Cardiac Enzymes:  Basename 03/26/11 0903 03/26/11 0058 03/25/11 1720  CKTOTAL 100 102 108  CKMB 1.9 1.9 1.9  CKMBINDEX -- -- --  TROPONINI <0.30 <0.30 <0.30   BNP: No components found with this basename: POCBNP:3 D-Dimer:  Alvira Philips 03/25/11 1720  DDIMER 0.65*   Hemoglobin A1C: No results found for this basename: HGBA1C in the last 72 hours Fasting Lipid Panel: No results found for this basename: CHOL,HDL,LDLCALC,TRIG,CHOLHDL,LDLDIRECT in the last 72 hours Thyroid Function Tests:  Basename 03/25/11 1720  TSH 0.728  T4TOTAL --  T3FREE --  THYROIDAB --   Anemia Panel: No results found for this basename: VITAMINB12,FOLATE,FERRITIN,TIBC,IRON,RETICCTPCT in the last 72 hours  RADIOLOGY: Dg Chest 1 View  03/25/2011  *RADIOLOGY REPORT*  Clinical Data: 61 year old female with shortness of breath.  CHEST - 1 VIEW  Comparison: 01/06/2011 and earlier.  Findings: Portable semi upright AP view 1720 hours.  Chronically low lung volumes.  Stable cardiac size and mediastinal contours. No pneumothorax, pulmonary edema, pleural effusion or consolidation. Visualized tracheal air column is within normal limits.  IMPRESSION: No acute cardiopulmonary abnormality.  Original Report Authenticated By: Harley Hallmark, M.D.   US Venous Img Lower Unilateral Left  03/13/2011  *RADIOLOGY REPORT*  Clinical Data: Left leg swelling, rule out DVT  LEFT LOWER EXTREMITY VENOUS DUPLEX ULTRASOUND  Technique:  Gray-scale  sonography with graded compression, as well as color Doppler and duplex ultrasound, were performed to evaluate the deep venous system of the lower extremity from the level of the common femoral vein through the popliteal and proximal calf veins. Spectral Doppler was utilized to evaluate flow at rest and with distal augmentation maneuvers.  Comparison:  10/27/2004  Findings: Ultrasound examination of the left lower extremity was performed.  Nonobstructive thrombus noted in the left common femoral vein. There is a obstructive thrombus in the femoral vein extending in popliteal vein, posterior tibial vein and peroneal vein with decreased compressibility.  Findings are consistent with deep vein thrombosis.  IMPRESSION: Deep vein thrombosis with occlusive thrombus femoral vein extending in the left popliteal left peroneal and left posterior tibial vein.  Nonocclusive thrombus in the left common femoral vein.  This was made a call report.  Original Report Authenticated By: Natasha Mead, M.D.    ASSESSMENT AND PLAN:  Active Problems:  Factor V Leiden  Chronic anticoagulation  GIST (gastrointestinal stromal tumor), malignant  Hypotension, iatrogenic  Ventricular tachycardia  1.  VT- improved with flecainide Continues to have PVCs Will add low dose toprol and titrate as able She would like to avoid ablation if possible Maintain electrolytes ekg in am  2. Factor V leiden deficiency subtherapeutic INR Goal INR 2-3 Start heparin gtt until INR >2  Consider restarting glivec soon   To step down today    Hillis Range, MD 03/28/2011 9:01 AM

## 2011-03-28 NOTE — Progress Notes (Addendum)
Pharmacy: Re-Heparin  A: Patient is a 61 y.o F on anticoagulation for hx DVT and Factor V Leiden deficiency.  First heparin level is subtherapeutic at  0.25 (goal 0.3-0.7).    P: 1) Increase heparin drip to 1600 units/hr      2) Recheck another 6 hour heparin level   Dorna Leitz, PharmD, BCPS

## 2011-03-28 NOTE — Progress Notes (Addendum)
ANTICOAGULATION CONSULT NOTE -Follow Up Consult  Pharmacy Consult for Coumadin Indication: History of DVT   Allergies  Allergen Reactions  . Cephalexin Swelling  . Dexlansoprazole Swelling  . Latex Itching  . Other Swelling    Patient states that Pecans cause her mouth to swell.  . Pantoprazole Sodium Swelling  . Penicillins Swelling  . Tape Itching  . Lovenox Itching, Swelling and Palpitations  . Nylon Rash  . Omeprazole Itching and Rash  . Sulfonamide Derivatives Rash    Patient Measurements: Height: 5\' 7"  (170.2 cm) Weight: 238 lb 1.6 oz (108 kg) IBW/kg (Calculated) : 61.6  125% IBW = 77 Heparin Dosing Weight = 86kg  Vital Signs: Temp: 98.6 F (37 C) (03/02 0700) Temp src: Oral (03/02 0700) BP: 110/75 mmHg (03/02 0800) Pulse Rate: 86  (03/02 0800)  Labs: Lab Results  Component Value Date   INR 1.56* 03/28/2011   INR 1.85* 03/27/2011   INR 3.05* 03/26/2011   Lab Results  Component Value Date   WBC 6.4 03/27/2011   HGB 9.2* 03/27/2011   HCT 31.0* 03/27/2011   MCV 81.6 03/27/2011   PLT 290 03/27/2011   .  Medical History: Past Medical History  Diagnosis Date  . Vitamin B12 deficiency     vit b12 1000 mcg monthly  . Cellulitis of left leg 2006  . Ulcer 05/2009    esophageal  . Clotting disorder     heterozygosity from factor v leiden  . Pernicious anemia 07/10/2010  . DVT (deep venous thrombosis) 07/10/2010    on coumadin  . Factor V Leiden   . Anxiety   . 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 Allergic urticaria 01/04/2011    Rash from tape.  Marland Kitchen GERD (gastroesophageal reflux disease)   . Ventricular tachycardia 03/26/11    Medications:  Scheduled:     . ALPRAZolam  0.5 mg Oral Daily  . famotidine  20 mg Oral QHS  . flecainide  100 mg Oral Q12H  . folic acid  1 mg Oral Daily  . iron polysaccharides  150 mg Oral BH-q7a  . metoprolol succinate  25 mg Oral Daily  . pantoprazole  40 mg Oral Q1200  . sodium chloride  3 mL Intravenous Q12H  . warfarin   12.5 mg Oral ONCE-1800  . warfarin  7 mg Oral ONCE-1800  . Warfarin - Pharmacist Dosing Inpatient   Does not apply q1800  . DISCONTD: pantoprazole sodium  20 mg Oral Q1200    Admit Complaint: The patient is a 61 yo female with h/o DVT (06/2010) , Factor V Leiden deficiency and malignant gastrointestinal stromal tumor on lifelong coumadin, admitted 03/25/11 with palpitations.  Pharmacy consulted to continue warfarin  Home dose was 8 mg every other days alternating with 7mg  every other day last taken 03/25/11 prior to admission.   Assessment: Anticoagulation: DVT, Factor V Leiden deficiency,  INR dropped below goal despite continuing home dose, significant to note that 2/28 dose was not charted given.  CBC stable.  Noted plan to start heparin while INR < 2.    Cardiovascular: new VT, did not convert with DCCV.  Flecinide initiated 2/28.  K 3.8, no magnesium level  Gastrointestinal / Nutrition: Esophageal ulcer, recent surgical resection of small bowel mass, on protonix, and pepcid as PTA, despite documented allergies. Neurology: Anxiety, xanax Hematology / Oncology: Gastrointestinal Stromal Tumor (GIST) anemia: Niferex, folic acid, H/H trending down. PTA Medication Issues: Home medications not ordered: Gleevec, Vitamin B12  Goal of Therapy:  INR 2-3   Plan:  Warfarin 12.5 mg x 1 tonight, given INR remains < goal to account for missed dose 2/28 anticipate return to home dose tomorrow Heparin 2000 units IV x 1 then 1400 units/hr, check heparin level in 6 hours. Daily heparin level and CBC INR/PT daily Consider resuming home gleevec when clinically appropriate, but noted this medication is associated with a <5% risk of palpitations. Monitor CBC, K and Magnesium  Lovenia Kim Pharm.D., BCPS Clinical Pharmacist Pager: [phone] Phone: [phone]  03/28/2011 9:04 AM     Ygnacio Fecteau Merrily Brittle 03/28/2011,9:01 AM

## 2011-03-29 DIAGNOSIS — I82409 Acute embolism and thrombosis of unspecified deep veins of unspecified lower extremity: Secondary | ICD-10-CM

## 2011-03-29 LAB — BASIC METABOLIC PANEL
CO2: 25 mEq/L (ref 19–32)
Calcium: 9.1 mg/dL (ref 8.4–10.5)
Creatinine, Ser: 0.63 mg/dL (ref 0.50–1.10)
Glucose, Bld: 106 mg/dL — ABNORMAL HIGH (ref 70–99)

## 2011-03-29 LAB — CBC
HCT: 30.9 % — ABNORMAL LOW (ref 36.0–46.0)
MCH: 24.4 pg — ABNORMAL LOW (ref 26.0–34.0)
Platelets: 275 10*3/uL (ref 150–400)

## 2011-03-29 LAB — HEPARIN LEVEL (UNFRACTIONATED)
Heparin Unfractionated: 0.4 IU/mL (ref 0.30–0.70)
Heparin Unfractionated: 0.54 IU/mL (ref 0.30–0.70)

## 2011-03-29 MED ORDER — DOCUSATE SODIUM 100 MG PO CAPS
100.0000 mg | ORAL_CAPSULE | Freq: Two times a day (BID) | ORAL | Status: DC
  Administered 2011-03-29 – 2011-03-30 (×3): 100 mg via ORAL
  Filled 2011-03-29 (×4): qty 1

## 2011-03-29 MED ORDER — SENNA 8.6 MG PO TABS
1.0000 | ORAL_TABLET | Freq: Every day | ORAL | Status: DC | PRN
  Administered 2011-03-29: 8.6 mg via ORAL
  Filled 2011-03-29: qty 1

## 2011-03-29 MED ORDER — POLYSACCHARIDE IRON COMPLEX 150 MG PO CAPS
150.0000 mg | ORAL_CAPSULE | Freq: Every day | ORAL | Status: DC
  Administered 2011-03-30: 150 mg via ORAL
  Filled 2011-03-29: qty 1

## 2011-03-29 MED ORDER — SODIUM CHLORIDE 0.9 % IV SOLN: INTRAVENOUS | Status: DC

## 2011-03-29 MED ORDER — METOPROLOL SUCCINATE ER 50 MG PO TB24
50.0000 mg | ORAL_TABLET | Freq: Every day | ORAL | Status: DC
  Administered 2011-03-29 – 2011-03-30 (×2): 50 mg via ORAL
  Filled 2011-03-29 (×2): qty 1

## 2011-03-29 MED ORDER — POTASSIUM CHLORIDE CRYS ER 20 MEQ PO TBCR
40.0000 meq | EXTENDED_RELEASE_TABLET | Freq: Once | ORAL | Status: AC
  Administered 2011-03-29: 40 meq via ORAL
  Filled 2011-03-29: qty 2

## 2011-03-29 MED ORDER — WARFARIN SODIUM 10 MG PO TABS
10.0000 mg | ORAL_TABLET | Freq: Once | ORAL | Status: AC
  Administered 2011-03-29: 10 mg via ORAL
  Filled 2011-03-29: qty 1

## 2011-03-29 NOTE — Progress Notes (Signed)
ANTICOAGULATION CONSULT NOTE - Follow Up Consult  Pharmacy Consult for heparin Indication: DVT/Factor V Leiden deficiency  Labs:  Basename 03/28/11 2355 03/28/11 1614 03/28/11 0630 03/27/11 0625 03/26/11 0903 03/26/11 0429  HGB -- -- -- 9.2* -- 9.5*  HCT -- -- -- 31.0* -- 31.5*  PLT -- -- -- 290 -- 293  APTT -- -- -- -- -- --  LABPROT -- -- 19.0* 21.7* -- 32.0*  INR -- -- 1.56* 1.85* -- 3.05*  HEPARINUNFRC 0.40 0.25* -- -- -- --  CREATININE -- -- 0.61 0.62 -- 0.61  CKTOTAL -- -- -- -- 100 --  CKMB -- -- -- -- 1.9 --  TROPONINI -- -- -- -- <0.30 --   Assessment/Plan: 61yo female now therapeutic on heparin after rate increase.  Will continue gtt at current rate and confirm stable with additional level.  Colleen Can PharmD BCPS 03/29/2011,1:43 AM

## 2011-03-29 NOTE — Progress Notes (Signed)
ANTICOAGULATION CONSULT NOTE - Follow Up Consult  Pharmacy Consult for Heparin/Coumadin Indication: DVT/Factor V Leiden deficiency  Labs:  Basename 03/29/11 0505 03/28/11 2355 03/28/11 1614 03/28/11 0630 03/27/11 0625 03/26/11 0903  HGB 9.3* -- -- -- 9.2* --  HCT 30.9* -- -- -- 31.0* --  PLT 275 -- -- -- 290 --  APTT -- -- -- -- -- --  LABPROT 21.9* -- -- 19.0* 21.7* --  INR 1.88* -- -- 1.56* 1.85* --  HEPARINUNFRC 0.54 0.40 0.25* -- -- --  CREATININE 0.63 -- -- 0.61 0.62 --  CKTOTAL -- -- -- -- -- 100  CKMB -- -- -- -- -- 1.9  TROPONINI -- -- -- -- -- <0.30   Assessment/Plan: 61 yo female with h/o Facotr V Leiden/DVT for anticoagulation  Goal: INR 2-3 Heparin level 0.3-0.7  Plan: Continue Heparin at current rate Coumadin 10 mg po tonight  [name], Gary Fleet PharmD BCPS 03/29/2011,8:56 AM

## 2011-03-29 NOTE — Progress Notes (Signed)
Pt admitted with incessant VT, now improved with flecainide  SUBJECTIVE: The patient is doing well today.  At this time, she denies chest pain, shortness of breath, or any new concerns.     . ALPRAZolam  0.5 mg Oral Daily  . famotidine  20 mg Oral QHS  . flecainide  100 mg Oral Q12H  . folic acid  1 mg Oral Daily  . heparin  2,000 Units Intravenous Once  . iron polysaccharides  150 mg Oral BH-q7a  . metoprolol succinate  50 mg Oral Daily  . pantoprazole  40 mg Oral Q1200  . potassium chloride  40 mEq Oral Once  . sodium chloride  3 mL Intravenous Q12H  . warfarin  12.5 mg Oral ONCE-1800  . Warfarin - Pharmacist Dosing Inpatient   Does not apply q1800  . DISCONTD: metoprolol succinate  25 mg Oral Daily      . heparin 1,600 Units/hr (03/29/11 0600)  . DISCONTD: sodium chloride 10 mL/hr at 03/29/11 0600    OBJECTIVE: Physical Exam: Filed Vitals:   03/29/11 0600 03/29/11 0621 03/29/11 0729 03/29/11 0800  BP:  151/81  132/73  Pulse:      Temp:   98.7 F (37.1 C)   TempSrc:   Oral   Resp:    18  Height:      Weight: 238 lb 12.1 oz (108.3 kg)     SpO2:   91%     Intake/Output Summary (Last 24 hours) at 03/29/11 0850 Last data filed at 03/29/11 0800  Gross per 24 hour  Intake 1078.44 ml  Output    800 ml  Net 278.44 ml    Telemetry reveals sinus rhythm with less frequent PVCs  GEN- The patient is well appearing, alert and oriented x 3 today.   Head- normocephalic, atraumatic Eyes-  Sclera clear, conjunctiva pink Ears- hearing intact Oropharynx- clear Neck- supple, no JVP Lymph- no cervical lymphadenopathy Lungs- Clear to ausculation bilaterally, normal work of breathing Heart- Regular rate and rhythm, no murmurs, rubs or gallops, PMI not laterally displaced GI- soft, NT, ND, + BS Extremities- no clubbing, cyanosis, or edema Skin- no rash or lesion Psych- euthymic mood, full affect Neuro- strength and sensation are intact  LABS: Basic Metabolic  Panel:  Basename 03/29/11 0505 03/28/11 0630  NA 142 140  K 3.5 3.8  CL 109 109  CO2 25 23  GLUCOSE 106* 110*  BUN 7 8  CREATININE 0.63 0.61  CALCIUM 9.1 8.7  MG 2.1 --  PHOS -- --   Liver Function Tests:  Basename 03/28/11 0630  AST 27  ALT 37*  ALKPHOS 116  BILITOT 0.3  PROT 6.5  ALBUMIN 3.0*   No results found for this basename: LIPASE:2,AMYLASE:2 in the last 72 hours CBC:  Basename 03/29/11 0505 03/27/11 0625  WBC 6.9 6.4  NEUTROABS -- --  HGB 9.3* 9.2*  HCT 30.9* 31.0*  MCV 81.1 81.6  PLT 275 290   ASSESSMENT AND PLAN:  Active Problems:  Factor V Leiden  Chronic anticoagulation  GIST (gastrointestinal stromal tumor), malignant  Hypotension, iatrogenic  Ventricular tachycardia  1.  VT- improved with flecainide Continues to have PVCs Will increase toprol and continue to titrate as able She would like to avoid ablation if possible Maintain electrolytes  2. Factor V leiden deficiency subtherapeutic INR Goal INR 2-3 Continue heparin gtt until INR >2  3. CA Pt would like to not restart gleevec at this time.  She will discuss with Dr Laurie Panda  To telemetry today Cardiac rehab to see  [facility] to DC home tomorrow  Hillis Range, MD 03/29/2011 8:50 AM

## 2011-03-30 LAB — CBC
HCT: 31.7 % — ABNORMAL LOW (ref 36.0–46.0)
MCHC: 30.3 g/dL (ref 30.0–36.0)
MCV: 81.5 fL (ref 78.0–100.0)
Platelets: 290 10*3/uL (ref 150–400)
RDW: 19.7 % — ABNORMAL HIGH (ref 11.5–15.5)

## 2011-03-30 LAB — PROTIME-INR: INR: 2.54 — ABNORMAL HIGH (ref 0.00–1.49)

## 2011-03-30 LAB — BASIC METABOLIC PANEL
BUN: 8 mg/dL (ref 6–23)
Calcium: 9.2 mg/dL (ref 8.4–10.5)
GFR calc Af Amer: >90 mL/min (ref >90)
GFR calc non Af Amer: >90 mL/min (ref >90)
Glucose, Bld: 91 mg/dL (ref 70–99)
Sodium: 141 mEq/L (ref 135–145)

## 2011-03-30 MED ORDER — METOPROLOL TARTRATE 50 MG PO TABS: 50.0000 mg | ORAL_TABLET | Freq: Two times a day (BID) | ORAL | Status: DC

## 2011-03-30 MED ORDER — HYDROCORTISONE 1 % EX CREA
TOPICAL_CREAM | Freq: Two times a day (BID) | CUTANEOUS | Status: DC
  Administered 2011-03-30: 13:00:00 via TOPICAL
  Filled 2011-03-30: qty 28

## 2011-03-30 MED ORDER — WARFARIN SODIUM 5 MG PO TABS: 5.0000 mg | ORAL_TABLET | Freq: Every day | ORAL | Status: DC

## 2011-03-30 MED ORDER — WARFARIN SODIUM 5 MG PO TABS
5.0000 mg | ORAL_TABLET | Freq: Once | ORAL | Status: DC
  Filled 2011-03-30: qty 1

## 2011-03-30 MED ORDER — FLECAINIDE ACETATE 100 MG PO TABS: 100.0000 mg | ORAL_TABLET | Freq: Two times a day (BID) | ORAL | Status: DC

## 2011-03-30 MED FILL — Medication: Qty: 1 | Status: AC

## 2011-03-30 NOTE — Progress Notes (Signed)
ANTICOAGULATION CONSULT NOTE - Follow Up Consult  Pharmacy Consult for Heparin/Coumadin Indication: DVT/Factor V Leiden deficiency  Labs:  Basename 03/30/11 0530 03/29/11 0505 03/28/11 2355 03/28/11 0630  HGB 9.6* 9.3* -- --  HCT 31.7* 30.9* -- --  PLT 290 275 -- --  APTT -- -- -- --  LABPROT 27.8* 21.9* -- 19.0*  INR 2.54* 1.88* -- 1.56*  HEPARINUNFRC 0.47 0.54 0.40 --  CREATININE 0.61 0.63 -- 0.61  CKTOTAL -- -- -- --  CKMB -- -- -- --  TROPONINI -- -- -- --   Assessment/Plan: 61 yo female with h/o Facotr V Leiden/DVT for anticoagulation.  Heparin level therapeutic.  Protime increased by ~6 sec.  Goal: INR 2-3 Heparin level 0.3-0.7  Plan: Continue Heparin at current rate.  Anticipate dc today. Coumadin 5 mg po tonight  Vang Kraeger, Haskel Schroeder PharmD 03/30/2011,7:52 AM

## 2011-03-30 NOTE — Progress Notes (Signed)
Patient ID: MADISAN BICE, female   DOB: 12-05-1950, 61 y.o.   MRN: [id number] Subjective:  No chest pain, sob, or palpitations.  Objective:  Vital Signs in the last 24 hours: Temp:  [97.9 F (36.6 C)-98.4 F (36.9 C)] 97.9 F (36.6 C) (03/04 0800) Resp:  [16-19] 19  (03/04 0400) BP: (117-139)/(59-77) 138/71 mmHg (03/04 0600) SpO2:  [92 %-95 %] 92 % (03/04 0400) Weight:  [107.5 kg (236 lb 15.9 oz)] 107.5 kg (236 lb 15.9 oz) (03/04 0600)  Intake/Output from previous day: 03/03 0701 - 03/04 0700 In: 1287 [P.O.:740; I.V.:547] Out: -  Intake/Output from this shift: Total I/O In: 26 [I.V.:26] Out: -   Physical Exam: Well appearing NAD HEENT: Unremarkable Neck:  No JVD, no thyromegally Lymphatics:  No adenopathy Back:  No CVA tenderness Lungs:  Clear with no wheezes. HEART:  Regular rate rhythm, no murmurs, no rubs, no clicks Abd:  Flat, positive bowel sounds, no organomegally, no rebound, no guarding Ext:  2 plus pulses, no edema, no cyanosis, no clubbing Skin:  No rashes no nodules Neuro:  CN II through XII intact, motor grossly intact  Lab Results:  Basename 03/30/11 0530 03/29/11 0505  WBC 5.7 6.9  HGB 9.6* 9.3*  PLT 290 275    Basename 03/30/11 0530 03/29/11 0505  NA 141 142  K 3.9 3.5  CL 109 109  CO2 22 25  GLUCOSE 91 106*  BUN 8 7  CREATININE 0.61 0.63   No results found for this basename: TROPONINI:2,CK,MB:2 in the last 72 hours Hepatic Function Panel  Basename 03/28/11 0630  PROT 6.5  ALBUMIN 3.0*  AST 27  ALT 37*  ALKPHOS 116  BILITOT 0.3  BILIDIR --  IBILI --   No results found for this basename: CHOL in the last 72 hours No results found for this basename: PROTIME in the last 72 hours  Imaging: No results found.  Cardiac Studies: Tele - NSR. No PVC's. Assessment/Plan:  1. VT - resolved on flecainide and lopressor. Would discharge on flecainide 100mg  twice daily and Lopressor 50 mg twice daily. Exercise treadmill in [place] this  week. ?Thursday. Followup with me in 2-3 weeks in [place].  LOS: 5 days    Lewayne Bunting 03/30/2011, 8:18 AM

## 2011-03-30 NOTE — Progress Notes (Signed)
Pt. discharged to home with spouse.  PIVs x2 removed and paper tape dressings applied to sites d/t tape reactions.  Hydrocortisone ointment applied.  Ambulated to vehicle w/o difficulty.  Pt. Awake, alert and oriented upon discharge.  All questions and concerns addressed and d/c instructions given.

## 2011-03-30 NOTE — Discharge Summary (Signed)
Patient ID: [name],  MRN: [id number], DOB/AGE: 1950-02-27 61 y.o.  Admit date: 03/25/2011 Discharge date: 03/30/2011  Primary Care Provider: Rudi Heap, MD Primary Cardiologist: R. Rothbart, MD/G. Ladona Ridgel, MD  Discharge Diagnoses Principal Problem:  *Ventricular tachycardia Active Problems:  Factor V Leiden  Chronic anticoagulation  GIST (gastrointestinal stromal tumor), malignant  Hypotension, iatrogenic   Allergies Allergies  Allergen Reactions  . Cephalexin Swelling  . Dexlansoprazole Swelling  . Latex Itching  . Other Swelling    Patient states that Pecans cause her mouth to swell.  Marland Kitchen Penicillins Swelling  . Tape Itching  . Lovenox Itching, Swelling and Palpitations  . Nylon Rash  . Omeprazole Itching and Rash  . Sulfonamide Derivatives Rash    Procedures  2D Echocardiogram - Performed @ [facility] on 03/26/2011 Study Conclusions  - Left ventricle: The cavity size was normal. There was mild focal basal hypertrophy of the septum. Systolic function was low normal. The estimated ejection fraction was in the range of 50% to 55%. Hypokinesis of the midanteroseptal myocardium. - Aortic valve: Mildly calcified annulus. Trileaflet. - Mitral valve: Calcified annulus. - Right ventricle: The cavity size was mildly dilated. Wall thickness was at the upper limits of normal. - Right atrium: The atrium was mildly dilated. - Atrial septum: No defect or patent foramen ovale was identified.  History of Present Illness  61 year old female with the above complex problem list who was in her usual state of health until February 26 which began to experience dizziness with palpitations. She checked her blood pressure multiple times and noted that it was low. She was seen at the [facility] on February 27 and was found to be tachycardic and hypotensive.EKG was performed showing a narrow complex tachycardia and she was referred to the lobe our cardiology  office. There, a limited echocardiogram was performed showing moderately impaired left ventricular systolic function. Tachycardia was treated with adenosine x2 without change in rhythm and this was followed by metoprolol 5 mg IV which resulted in moderate hypotension with pressures in the 70s. She was then treated with IV fluids and admitted to [facility] for further evaluation. Patient remains tachycardic at [facility] despite initiation of intravenous diltiazem and carvedilol. In fact of these served only to make her more hypotensive. Decision was made by patient and staff to discontinue Gleevec as this had the potential of being an offending agent. Patient remained tachycardic decision was made to transfer to [facility] for electrophysiologic evaluation.  Hospital Course  Following admission to [facility] patient was seen by electrophysiology. She remained tachycardic and again was treated with adenosine 12 mg which failed to terminate the tachycardia or even demonstrate transient AV block. Patient was then treated with 10 mg of intravenous verapamil over a 15 minute period resulting in hypotension requiring initiation of Neo-Synephrine. In the setting of instability, decision was made to perform urgent cardioversion. Anesthesia at the bedside, patient was delivered 3 synchronized 200 J biphasic shocks however tachycardia persisted. There was significant concern that the tachycardia arises from a location in the interventricular septum very near the normal conduction system. Thus it was felt that ablation of this area cavity rather high risk of AV block and need for pacemaker placement. Patient wished to avoid ablation because of risks and flecainide therapy was initiated on the evening of February 28.  On flecainide and oral metoprolol, patient's tachycardia resolved. She's been monitored in the coronary intensive care unit over the weekend without recurrence  of tachycardia. She has had stable  blood pressures and overall has tolerated her medications. We plan to discharge her home today in good condition and have arranged for an outpatient exercise treadmill test later this week secondary to initiation of flecainide therapy.  Discharge Vitals Blood pressure 134/64, pulse 69, temperature 97.9 F (36.6 C), temperature source Oral, resp. rate 19, height 5\' 7"  (1.702 m), weight 236 lb 15.9 oz (107.5 kg), SpO2 92.00%.  Filed Weights   03/26/11 1800 03/29/11 0600 03/30/11 0600  Weight: 238 lb 1.6 oz (108 kg) 238 lb 12.1 oz (108.3 kg) 236 lb 15.9 oz (107.5 kg)    Labs  CBC  Basename 03/30/11 0530 03/29/11 0505  WBC 5.7 6.9  NEUTROABS -- --  HGB 9.6* 9.3*  HCT 31.7* 30.9*  MCV 81.5 81.1  PLT 290 275   Basic Metabolic Panel  Basename 03/30/11 0530 03/29/11 0505  NA 141 142  K 3.9 3.5  CL 109 109  CO2 22 25  GLUCOSE 91 106*  BUN 8 7  CREATININE 0.61 0.63  CALCIUM 9.2 9.1  MG -- 2.1  PHOS -- --   Liver Function Tests  Basename 03/28/11 0630  AST 27  ALT 37*  ALKPHOS 116  BILITOT 0.3  PROT 6.5  ALBUMIN 3.0*   Disposition  Pt is being discharged home today in good condition.  Follow-up Plans & Appointments  Follow-up Information    Follow up with [facility] on 04/01/2011. (11:00 AM)    Contact information:   [phone]      Follow up with Lewayne Bunting, MD on 04/16/2011. (11:30 AM)    Contact information:   [facility] [place] [phone]       Follow up with Randall An, MD. (as scheduled)    Contact information:   [address] [place] [address] [phone]       Follow [facility] with [facility]. (Follow-up with your coumadin provider in 1 week for INR check)          Discharge Medications  Medication List  As of 03/30/2011 12:04 PM   STOP taking these medications         GLEEVEC PO         TAKE these medications         acetaminophen 325 MG tablet   Commonly known as: TYLENOL    Take 650 mg by mouth every 6 (six) hours as needed. Pain      ALPRAZolam 0.5 MG tablet   Commonly known as: XANAX   Take 0.5 mg by mouth daily.      CYANOCOBALAMIN IJ   Inject 1,000 mcg as directed every 30 (thirty) days.      diphenhydrAMINE 25 mg capsule   Commonly known as: BENADRYL   Take 25 mg by mouth every 6 (six) hours as needed. Itching or allergies      famotidine 20 MG tablet   Commonly known as: PEPCID   Take 20 mg by mouth at bedtime.      flecainide 100 MG tablet   Commonly known as: TAMBOCOR   Take 1 tablet (100 mg total) by mouth every 12 (twelve) hours.      folic acid 1 MG tablet   Commonly known as: FOLVITE   Take 1 mg by mouth daily.      iron polysaccharides 150 MG capsule   Commonly known as: NIFEREX   Take 150 mg by mouth every morning.  lansoprazole 15 MG capsule   Commonly known as: PREVACID   Take 1 capsule (15 mg total) by mouth daily.      metoprolol 50 MG tablet   Commonly known as: LOPRESSOR   Take 1 tablet (50 mg total) by mouth 2 (two) times daily.      warfarin 2 MG tablet   Commonly known as: COUMADIN   Take 2 mg by mouth daily. Patient states that she takes 8 mg every other day , 7 mg the other days      warfarin 5 MG tablet   Commonly known as: COUMADIN   Take 1 tablet (5 mg total) by mouth daily. Patient states that she is taking 8 mg every other day, 7 mg the other days            Outstanding Labs/Studies  Exercise Treadmill Test - 3/6  Duration of Discharge Encounter   Greater than 30 minutes including physician time.  SignedNicolasa Ducking NP 03/30/2011, 12:04 PM

## 2011-03-30 NOTE — Discharge Instructions (Signed)
***  PLEASE REMEMBER TO BRING ALL OF YOUR MEDICATIONS TO EACH OF YOUR FOLLOW-UP OFFICE VISITS.  

## 2011-04-01 ENCOUNTER — Telehealth (HOSPITAL_COMMUNITY): Payer: Self-pay

## 2011-04-01 ENCOUNTER — Emergency Department (HOSPITAL_COMMUNITY)
Admission: EM | Admit: 2011-04-01 | Discharge: 2011-04-01 | Disposition: A | Discharge status: Home / Self Care | Payer: Medicare Other | Attending: Emergency Medicine | Admitting: Emergency Medicine

## 2011-04-01 ENCOUNTER — Ambulatory Visit: Payer: Medicare Other

## 2011-04-01 ENCOUNTER — Other Ambulatory Visit (HOSPITAL_COMMUNITY): Payer: Self-pay | Admitting: Oncology

## 2011-04-01 ENCOUNTER — Emergency Department (HOSPITAL_COMMUNITY): Payer: Medicare Other

## 2011-04-01 ENCOUNTER — Encounter (HOSPITAL_COMMUNITY): Payer: Self-pay | Admitting: *Deleted

## 2011-04-01 ENCOUNTER — Encounter (HOSPITAL_COMMUNITY): Payer: Medicare Other | Attending: Internal Medicine

## 2011-04-01 DIAGNOSIS — R109 Unspecified abdominal pain: Secondary | ICD-10-CM | POA: Insufficient documentation

## 2011-04-01 DIAGNOSIS — D6851 Activated protein C resistance: Secondary | ICD-10-CM

## 2011-04-01 DIAGNOSIS — K5900 Constipation, unspecified: Secondary | ICD-10-CM | POA: Insufficient documentation

## 2011-04-01 DIAGNOSIS — I82409 Acute embolism and thrombosis of unspecified deep veins of unspecified lower extremity: Secondary | ICD-10-CM

## 2011-04-01 DIAGNOSIS — Z86718 Personal history of other venous thrombosis and embolism: Secondary | ICD-10-CM

## 2011-04-01 DIAGNOSIS — D6859 Other primary thrombophilia: Secondary | ICD-10-CM | POA: Insufficient documentation

## 2011-04-01 DIAGNOSIS — K6289 Other specified diseases of anus and rectum: Secondary | ICD-10-CM | POA: Insufficient documentation

## 2011-04-01 LAB — BASIC METABOLIC PANEL
CO2: 23 mEq/L (ref 19–32)
Calcium: 10 mg/dL (ref 8.4–10.5)
Creatinine, Ser: 0.74 mg/dL (ref 0.50–1.10)
Glucose, Bld: 126 mg/dL — ABNORMAL HIGH (ref 70–99)

## 2011-04-01 MED ORDER — POLYETHYLENE GLYCOL 3350 17 GM/SCOOP PO POWD: ORAL | Status: DC

## 2011-04-01 MED ORDER — BISACODYL 10 MG RE SUPP
10.0000 mg | Freq: Once | RECTAL | Status: AC
  Administered 2011-04-01: 10 mg via RECTAL
  Filled 2011-04-01: qty 1

## 2011-04-01 MED FILL — Medication: Qty: 1 | Status: AC

## 2011-04-01 NOTE — Telephone Encounter (Signed)
Error

## 2011-04-01 NOTE — Progress Notes (Signed)
Unable to complete Stress test. Patient was unable to finish walking, due to dyspnea.  HR 64 while walking on TM.  Stopped at 1:03 due to fatigue and dyspnea.  BP 120/62 & HR 50 after sitting down in recovery.  Dr. Ladona Ridgel notified via Joni Reining NP.  Erskine Speed, RN

## 2011-04-01 NOTE — Telephone Encounter (Signed)
Message copied by Sterling Big on Wed Apr 01, 2011  3:15 PM ------      Message from: Ellouise Newer III      Created: Wed Apr 01, 2011 11:59 AM       Same dose            PT/INR on Monday

## 2011-04-01 NOTE — ED Notes (Signed)
States her last BM was a week ago, states she was recently admitted for an irregular heart rate and is afraid to push to have a stool now

## 2011-04-01 NOTE — Telephone Encounter (Deleted)
Message copied by Sterling Big on Wed Apr 01, 2011  3:12 PM ------      Message from: Ellouise Newer III      Created: Wed Apr 01, 2011 11:59 AM       Same dose            PT/INR on Monday

## 2011-04-01 NOTE — ED Notes (Signed)
C/o constipation since last Wednesday when pt went to [facility] for rapid HR per pt

## 2011-04-01 NOTE — Telephone Encounter (Signed)
Notes Recorded by Evelena Leyden, RN on 04/01/2011 at 2:55 PM Patient instructed to continue coumadin 8 mg alternating with 7 mg and to return for next PT/INR on 3/11. Complains with inability to have BM. Over last 2 days has taken 2 phillips tablets, 8 stool softeners, Puralax powder last night and 60 cc of MOM today. Wants to know what else she can do to get her bowels to move?  1504 discussed with Dr. Lytle Butte.  Patient is to continue the stool softener and if no BM by 10 pm tonight to take 3 doses of MOM and if no BM by am to take 4 doses.  If no BM tomorrow to call us Friday AM. Instructions given to patient and verbalizes understanding.

## 2011-04-01 NOTE — ED Notes (Signed)
Pt with good results from suppository, J. Idol, PA is aware

## 2011-04-02 ENCOUNTER — Other Ambulatory Visit (HOSPITAL_COMMUNITY): Payer: Medicare Other

## 2011-04-02 NOTE — ED Provider Notes (Signed)
History     CSN: [id number]  Arrival date & time 04/01/11  1747   First MD Initiated Contact with Patient 04/01/11 2004      Chief Complaint  Patient presents with  . Fecal Impaction    (Consider location/radiation/quality/duration/timing/severity/associated sxs/prior treatment) Patient is a 61 y.o. female presenting with constipation. The history is provided by the patient.  Constipation  The current episode started 5 to 7 days ago. The onset was gradual. The problem has been unchanged. The pain is moderate. The stool is described as hard. There was no prior successful therapy. Prior unsuccessful therapies include stool softeners. Associated symptoms include abdominal pain and rectal pain. Pertinent negatives include no fever, no diarrhea, no hematemesis, no nausea, no vomiting, no chest pain, no headaches, no difficulty breathing and no rash. Her past medical history is significant for a recent illness. Past medical history comments: She was recently hospitalized due to ventricular tachycardia,  having been discharged last week.  She blames being sedenatry for source of constipation..    Past Medical History  Diagnosis Date  . Vitamin B12 deficiency     vit b12 1000 mcg monthly  . Cellulitis of left leg 2006  . Ulcer 05/2009    esophageal  . Clotting disorder     heterozygosity from factor v leiden  . Pernicious anemia 07/10/2010  . DVT (deep venous thrombosis) 07/10/2010    on coumadin  . Factor V Leiden   . Anxiety   . 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 Allergic urticaria 01/04/2011    Rash from tape.  Marland Kitchen GERD (gastroesophageal reflux disease)   . Ventricular tachycardia 03/26/11    Past Surgical History  Procedure Date  . Abdominal hysterectomy 1989  . Balloon dilation 12/11/2010    Procedure: BALLOON DILATION;  Surgeon: Malissa Hippo, MD;  Location: [facility];  Service: Endoscopy;  Laterality: N/A;  . Laparotomy 01/05/2011    Procedure: EXPLORATORY  LAPAROTOMY;  Surgeon: Dalia Heading;  Location: [facility] ORS;  Service: General;  Laterality: N/A;  . Bowel resection 01/05/2011    Procedure: SMALL BOWEL RESECTION;  Surgeon: Dalia Heading;  Location: [facility] ORS;  Service: General;;  Partial Small Bowel Resection  . Givens capsule study 01/02/2011    Procedure: GIVENS CAPSULE STUDY;  Surgeon: Malissa Hippo, MD;  Location: [facility];  Service: Endoscopy;  Laterality: N/A;    No family history on file.  History  Substance Use Topics  . Smoking status: Never Smoker   . Smokeless tobacco: Never Used  . Alcohol Use: No    OB History    Grav Para Term Preterm Abortions TAB SAB Ect Mult Living                  Review of Systems  Constitutional: Negative for fever.  HENT: Negative for congestion, sore throat and neck pain.   Eyes: Negative.   Respiratory: Negative for chest tightness and shortness of breath.   Cardiovascular: Negative for chest pain.  Gastrointestinal: Positive for abdominal pain, constipation, abdominal distention and rectal pain. Negative for nausea, vomiting, diarrhea and hematemesis.  Genitourinary: Negative.  Negative for difficulty urinating.  Musculoskeletal: Negative for joint swelling and arthralgias.  Skin: Negative.  Negative for rash and wound.  Neurological: Negative for dizziness, weakness, light-headedness, numbness and headaches.  Hematological: Negative.   Psychiatric/Behavioral: Negative.     Allergies  Cephalexin; Dexlansoprazole; Latex; Other; Penicillins; Tape; Lovenox; Nylon; Omeprazole; and Sulfonamide derivatives  Home Medications   Current Outpatient Rx  Name Route Sig Dispense Refill  . ACETAMINOPHEN 325 MG PO TABS Oral Take 650 mg by mouth every 6 (six) hours as needed. Pain    . ALPRAZOLAM 0.5 MG PO TABS Oral Take 0.5 mg by mouth daily.     Marland Kitchen FAMOTIDINE 20 MG PO TABS Oral Take 20 mg by mouth at bedtime.    Marland Kitchen FLECAINIDE ACETATE 100 MG PO TABS Oral Take 100 mg by mouth every 12 (twelve)  hours. **Takes at 630 am and 630pm**    . FOLIC ACID 1 MG PO TABS Oral Take 1 mg by mouth daily.      Marland Kitchen POLYSACCHARIDE IRON COMPLEX 150 MG PO CAPS Oral Take 150 mg by mouth every morning.     Marland Kitchen METOPROLOL TARTRATE 50 MG PO TABS Oral Take 1 tablet (50 mg total) by mouth 2 (two) times daily. 60 tablet 6  . WARFARIN SODIUM 2 MG PO TABS Oral Take 2 mg by mouth daily. Patient states that she takes 8 mg every other day , 7 mg the other days. **Takes at 6pm daily**    . WARFARIN SODIUM 5 MG PO TABS Oral Take 1 tablet (5 mg total) by mouth daily. Patient states that she is taking 8 mg every other day, 7 mg the other days      **TAKE 5 MG TONIGHT - 03/30/2011**  . CYANOCOBALAMIN IJ Injection Inject 1,000 mcg as directed every 30 (thirty) days.      Marland Kitchen POLYETHYLENE GLYCOL 3350 PO POWD  Take 17 grams once or twice daily per label instructions prn constipation 527 g 0    BP 141/64  Pulse 57  Temp 97.8 F (36.6 C)  Resp 20  Ht 5\' 7"  (1.702 m)  Wt 235 lb (106.595 kg)  BMI 36.81 kg/m2  SpO2 97%  Physical Exam  Nursing note and vitals reviewed. Constitutional: She is oriented to person, place, and time. She appears well-developed and well-nourished. No distress.  HENT:  Head: Normocephalic and atraumatic.  Eyes: Conjunctivae are normal.  Neck: Normal range of motion.  Cardiovascular: Normal rate, regular rhythm, normal heart sounds and intact distal pulses.   Pulmonary/Chest: Effort normal and breath sounds normal. She has no wheezes.  Abdominal: Soft. Bowel sounds are normal. There is generalized tenderness.  Genitourinary:       Patient has impacted stool in rectal vault.  Musculoskeletal: Normal range of motion.  Neurological: She is alert and oriented to person, place, and time.  Skin: Skin is warm and dry.  Psychiatric: She has a normal mood and affect.    ED Course  Procedures (including critical care time)  Labs Reviewed  BASIC METABOLIC PANEL - Abnormal; Notable for the following:     Glucose, Bld 126 (*)    All other components within normal limits  LAB REPORT - SCANNED   Dg Abd 2 Views  04/01/2011  *RADIOLOGY REPORT*  Clinical Data: Constipation.  No bowel movement 7 days.  ABDOMEN - 2 VIEW  Comparison: CT 01/02/2011  Findings: Normal bowel gas pattern.  Negative for bowel obstruction or free intraperitoneal gas.  No significant retained stool is present.  No renal calculi or bony abnormality.  Bilateral pleural effusions are present.  IMPRESSION: Negative for bowel obstruction or significant constipation.  Bilateral pleural effusions.  Original Report Authenticated By: Camelia Phenes, M.D.     1. Constipation    Patient given dulculax suppository and had large bm while in ed  with complete relief of symptoms.   MDM  Constipation.  At dc,  Pt concerned that she had missed her 6 pm dose of flecainide while here.  Advised to take as soon as home,  Then call her cardiologist in am to determine timing of next dose and schedule to get back on her 6 am and 6pm dosing routine.  Prescribed miralax for daily use.        Candis Musa, PA 04/02/11 1435

## 2011-04-03 ENCOUNTER — Inpatient Hospital Stay (HOSPITAL_COMMUNITY)
Admission: EM | Admit: 2011-04-03 | Discharge: 2011-04-08 | DRG: 389 | Disposition: A | Discharge status: Home / Self Care | Payer: Medicare Other | Attending: General Surgery | Admitting: General Surgery

## 2011-04-03 ENCOUNTER — Emergency Department (HOSPITAL_COMMUNITY): Payer: Medicare Other

## 2011-04-03 ENCOUNTER — Encounter (HOSPITAL_COMMUNITY): Payer: Self-pay | Admitting: *Deleted

## 2011-04-03 DIAGNOSIS — Z79899 Other long term (current) drug therapy: Secondary | ICD-10-CM

## 2011-04-03 DIAGNOSIS — E538 Deficiency of other specified B group vitamins: Secondary | ICD-10-CM | POA: Diagnosis present

## 2011-04-03 DIAGNOSIS — I82509 Chronic embolism and thrombosis of unspecified deep veins of unspecified lower extremity: Secondary | ICD-10-CM | POA: Diagnosis present

## 2011-04-03 DIAGNOSIS — D72829 Elevated white blood cell count, unspecified: Secondary | ICD-10-CM | POA: Diagnosis present

## 2011-04-03 DIAGNOSIS — D509 Iron deficiency anemia, unspecified: Secondary | ICD-10-CM | POA: Diagnosis present

## 2011-04-03 DIAGNOSIS — K219 Gastro-esophageal reflux disease without esophagitis: Secondary | ICD-10-CM | POA: Diagnosis present

## 2011-04-03 DIAGNOSIS — Z9049 Acquired absence of other specified parts of digestive tract: Secondary | ICD-10-CM

## 2011-04-03 DIAGNOSIS — Z880 Allergy status to penicillin: Secondary | ICD-10-CM

## 2011-04-03 DIAGNOSIS — K56609 Unspecified intestinal obstruction, unspecified as to partial versus complete obstruction: Secondary | ICD-10-CM

## 2011-04-03 DIAGNOSIS — Z6841 Body Mass Index (BMI) 40.0 and over, adult: Secondary | ICD-10-CM

## 2011-04-03 DIAGNOSIS — D6859 Other primary thrombophilia: Secondary | ICD-10-CM | POA: Diagnosis present

## 2011-04-03 DIAGNOSIS — Z7901 Long term (current) use of anticoagulants: Secondary | ICD-10-CM

## 2011-04-03 LAB — CBC
HCT: 36.9 % (ref 36.0–46.0)
MCV: 79.7 fL (ref 78.0–100.0)
RBC: 4.63 MIL/uL (ref 3.87–5.11)
WBC: 12.2 10*3/uL — ABNORMAL HIGH (ref 4.0–10.5)

## 2011-04-03 LAB — HEPARIN LEVEL (UNFRACTIONATED): Heparin Unfractionated: 0.4 IU/mL (ref 0.30–0.70)

## 2011-04-03 LAB — COMPREHENSIVE METABOLIC PANEL
Albumin: 3.6 g/dL (ref 3.5–5.2)
Alkaline Phosphatase: 112 U/L (ref 39–117)
BUN: 13 mg/dL (ref 6–23)
Creatinine, Ser: 0.72 mg/dL (ref 0.50–1.10)
Potassium: 4.1 mEq/L (ref 3.5–5.1)
Total Protein: 7.6 g/dL (ref 6.0–8.3)

## 2011-04-03 LAB — PROTIME-INR: INR: 2.38 — ABNORMAL HIGH (ref 0.00–1.49)

## 2011-04-03 LAB — URINALYSIS, ROUTINE W REFLEX MICROSCOPIC
Bilirubin Urine: NEGATIVE
Hgb urine dipstick: NEGATIVE
Specific Gravity, Urine: 1.015 (ref 1.005–1.030)
pH: 8.5 — ABNORMAL HIGH (ref 5.0–8.0)

## 2011-04-03 MED ORDER — IOHEXOL 300 MG/ML  SOLN
100.0000 mL | Freq: Once | INTRAMUSCULAR | Status: AC | PRN
  Administered 2011-04-03: 100 mL via INTRAVENOUS

## 2011-04-03 MED ORDER — MORPHINE SULFATE 2 MG/ML IJ SOLN
1.0000 mg | INTRAMUSCULAR | Status: DC | PRN
  Administered 2011-04-03 – 2011-04-04 (×5): 2 mg via INTRAVENOUS
  Filled 2011-04-03: qty 2
  Filled 2011-04-03 (×4): qty 1

## 2011-04-03 MED ORDER — ONDANSETRON HCL 4 MG/2ML IJ SOLN
4.0000 mg | Freq: Four times a day (QID) | INTRAMUSCULAR | Status: DC | PRN
  Administered 2011-04-03 – 2011-04-06 (×5): 4 mg via INTRAVENOUS
  Filled 2011-04-03 (×6): qty 2

## 2011-04-03 MED ORDER — HEPARIN (PORCINE) IN NACL 100-0.45 UNIT/ML-% IJ SOLN
1500.0000 [IU]/h | INTRAMUSCULAR | Status: DC
  Administered 2011-04-03 – 2011-04-05 (×4): 1600 [IU]/h via INTRAVENOUS
  Administered 2011-04-06: 1500 [IU]/h via INTRAVENOUS
  Administered 2011-04-06: 1600 [IU]/h via INTRAVENOUS
  Administered 2011-04-07: 1500 [IU]/h via INTRAVENOUS
  Filled 2011-04-03 (×8): qty 250

## 2011-04-03 MED ORDER — MORPHINE SULFATE 4 MG/ML IJ SOLN
4.0000 mg | Freq: Once | INTRAMUSCULAR | Status: DC
  Filled 2011-04-03: qty 1

## 2011-04-03 MED ORDER — PANTOPRAZOLE SODIUM 40 MG IV SOLR
40.0000 mg | Freq: Every day | INTRAVENOUS | Status: DC
  Administered 2011-04-03 – 2011-04-07 (×5): 40 mg via INTRAVENOUS
  Filled 2011-04-03 (×5): qty 40

## 2011-04-03 MED ORDER — MORPHINE SULFATE 4 MG/ML IJ SOLN
2.0000 mg | Freq: Once | INTRAMUSCULAR | Status: AC
  Administered 2011-04-03: 2 mg via INTRAVENOUS
  Filled 2011-04-03: qty 1

## 2011-04-03 MED ORDER — LACTATED RINGERS IV SOLN
INTRAVENOUS | Status: DC
  Administered 2011-04-03 – 2011-04-06 (×6): via INTRAVENOUS
  Administered 2011-04-06: 1000 mL via INTRAVENOUS
  Administered 2011-04-06 – 2011-04-08 (×5): via INTRAVENOUS

## 2011-04-03 MED ORDER — ONDANSETRON HCL 4 MG/2ML IJ SOLN
INTRAMUSCULAR | Status: AC
  Administered 2011-04-03: 06:00:00
  Filled 2011-04-03: qty 2

## 2011-04-03 MED ORDER — LORAZEPAM 2 MG/ML IJ SOLN
1.0000 mg | Freq: Four times a day (QID) | INTRAMUSCULAR | Status: DC | PRN
Start: 2011-04-03 — End: 2011-04-08
  Administered 2011-04-05 – 2011-04-06 (×2): 1 mg via INTRAVENOUS
  Filled 2011-04-03 (×2): qty 1

## 2011-04-03 MED ORDER — SODIUM CHLORIDE 0.9 % IJ SOLN
INTRAMUSCULAR | Status: AC
  Administered 2011-04-03: 12:00:00
  Filled 2011-04-03: qty 3

## 2011-04-03 MED ORDER — SODIUM CHLORIDE 0.9 % IJ SOLN: 20.0000 mg | INTRAVENOUS | Status: DC

## 2011-04-03 MED ORDER — LORAZEPAM 2 MG/ML IJ SOLN
1.0000 mg | Freq: Once | INTRAMUSCULAR | Status: AC
  Administered 2011-04-03: 1 mg via INTRAVENOUS
  Filled 2011-04-03: qty 1

## 2011-04-03 MED ORDER — METOPROLOL TARTRATE 1 MG/ML IV SOLN
5.0000 mg | Freq: Four times a day (QID) | INTRAVENOUS | Status: DC
  Administered 2011-04-03 – 2011-04-08 (×21): 5 mg via INTRAVENOUS
  Filled 2011-04-03 (×2): qty 5
  Filled 2011-04-03: qty 10
  Filled 2011-04-03 (×12): qty 5
  Filled 2011-04-03: qty 10
  Filled 2011-04-03 (×3): qty 5

## 2011-04-03 MED ORDER — SODIUM CHLORIDE 0.9 % IV SOLN
Freq: Once | INTRAVENOUS | Status: AC
  Administered 2011-04-03: 02:00:00 via INTRAVENOUS

## 2011-04-03 MED ORDER — SODIUM CHLORIDE 0.9 % IJ SOLN
INTRAMUSCULAR | Status: AC
  Administered 2011-04-03: 10 mL
  Filled 2011-04-03: qty 3

## 2011-04-03 NOTE — ED Notes (Signed)
NG tube, 52F inserted without difficulty, connected to low wall intermittent suction.

## 2011-04-03 NOTE — ED Notes (Signed)
Pt stated pain went away on it's own and wants to wait on the Morphine.

## 2011-04-03 NOTE — ED Provider Notes (Signed)
Medical screening examination/treatment/procedure(s) were performed by non-physician practitioner and as supervising physician I was immediately available for consultation/collaboration.   Aliany Fiorenza, MD 04/03/11 0757 

## 2011-04-03 NOTE — Progress Notes (Signed)
ANTICOAGULATION CONSULT NOTE - Initial Consult  Pharmacy Consult for Heparin Indication: DVT (while off Warfarin-surgery workup in progress)  Allergies  Allergen Reactions  . Cephalexin Swelling  . Dexlansoprazole Swelling  . Latex Itching  . Other Swelling    Patient states that Pecans cause her mouth to swell.  Marland Kitchen Penicillins Swelling  . Tape Itching  . Lovenox Itching, Swelling and Palpitations  . Nylon Rash  . Omeprazole Itching and Rash  . Sulfonamide Derivatives Rash    Patient Measurements: Height: 5' 7.5" (171.5 cm) Weight: 229 lb 12.8 oz (104.237 kg) IBW/kg (Calculated) : 62.75  Heparin Dosing Weight: 100 kg  Vital Signs: Temp: 97.7 F (36.5 C) (03/08 0658) Temp src: Oral (03/08 0658) BP: 153/81 mmHg (03/08 0658) Pulse Rate: 60  (03/08 0658)  Labs:  Basename 04/03/11 0532 04/03/11 0201 04/01/11 2120 04/01/11 1036  HGB -- 11.4* -- --  HCT -- 36.9 -- --  PLT -- 418* -- --  APTT -- -- -- --  LABPROT 26.4* -- -- 25.8*  INR 2.38* -- -- 2.31*  HEPARINUNFRC -- -- -- --  CREATININE -- 0.72 0.74 --  CKTOTAL -- -- -- --  CKMB -- -- -- --  TROPONINI -- -- -- --   Estimated Creatinine Clearance: 94.9 ml/min (by C-G formula based on Cr of 0.72).  Medical History: Past Medical History  Diagnosis Date  . Vitamin B12 deficiency     vit b12 1000 mcg monthly  . Cellulitis of left leg 2006  . Ulcer 05/2009    esophageal  . Clotting disorder     heterozygosity from factor v leiden  . Pernicious anemia 07/10/2010  . DVT (deep venous thrombosis) 07/10/2010    on coumadin  . Factor V Leiden   . Anxiety   . 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 Allergic urticaria 01/04/2011    Rash from tape.  Marland Kitchen GERD (gastroesophageal reflux disease)   . Ventricular tachycardia 03/26/11    Medications:  Scheduled:    . sodium chloride   Intravenous Once  . LORazepam  1 mg Intravenous Once  . metoprolol  5 mg Intravenous Q6H  . morphine  2 mg Intravenous Once  .  morphine  4 mg Intravenous Once  . ondansetron      . pantoprazole (PROTONIX) IV  40 mg Intravenous QHS  . sodium chloride        Assessment: Transitioning to full anticoagulation while NPO. Was on Heparin recently at [facility]. Patient tolerated Heparin without problems.  Goal of Therapy:  Heparin level 0.3-0.7 units/ml   Plan:  No bolus since INR is over 2.0 Heparin at 1600 units per hour.   Gilman Buttner, [place] J 04/03/2011,11:19 AM

## 2011-04-03 NOTE — ED Notes (Signed)
Pt stated she had an immediate onset of n/v after dinner. EMS started IV 20G RT wrist, gave 4mg  Zofran, 4mg  Morphine both SIVP.

## 2011-04-03 NOTE — ED Provider Notes (Signed)
History     CSN: [id number]  Arrival date & time 04/03/11  0100   First MD Initiated Contact with Patient 04/03/11 0123      Chief Complaint  Patient presents with  . Nausea  . Emesis  . 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 She presented 24 hours ago with fecal impaction, this was relieved with medications and had a large bowel movement yesterday night. Over the last 24 hours she has had several normal brown nonbloody bowel movements. This evening she developed acute onset of nausea and vomiting, vomited 3 times, was transported by paramedics, received morphine for pain and had cessation of all symptoms. On arrival her symptoms are very mild and consist of left upper quadrant cramping. She denies nausea and vomiting at this time. She had no blood in her vomit earlier. She denies fevers chills cough shortness of breath back pain swelling or rashes. Symptoms were intermittent, occurred several hours after eating red meat pot roast.  Patient is a 61 y.o. female presenting with vomiting and abdominal pain. The history is provided by the patient, the spouse, a relative and medical records.  Emesis  Associated symptoms include abdominal pain.  Abdominal Pain The primary symptoms of the illness include abdominal pain and vomiting.    Past Medical History  Diagnosis Date  . Vitamin B12 deficiency     vit b12 1000 mcg monthly  . Cellulitis of left leg 2006  . Ulcer 05/2009    esophageal  . Clotting disorder     heterozygosity from factor v leiden  . Pernicious anemia 07/10/2010  . DVT (deep venous thrombosis) 07/10/2010    on  coumadin  . Factor V Leiden   . Anxiety   . 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 Allergic urticaria 01/04/2011    Rash from tape.  Marland Kitchen GERD (gastroesophageal reflux disease)   . Ventricular tachycardia 03/26/11    Past Surgical History  Procedure Date  . Abdominal hysterectomy 1989  . Balloon dilation 12/11/2010    Procedure: BALLOON DILATION;  Surgeon: Malissa Hippo, MD;  Location: [facility];  Service: Endoscopy;  Laterality: N/A;  . Laparotomy 01/05/2011    Procedure: EXPLORATORY LAPAROTOMY;  Surgeon: Dalia Heading;  Location: [facility];  Service: General;  Laterality: N/A;  . Bowel resection 01/05/2011    Procedure: SMALL BOWEL RESECTION;  Surgeon: Dalia Heading;  Location: [facility];  Service: General;;  Partial Small Bowel Resection  . Givens capsule study 01/02/2011    Procedure: GIVENS CAPSULE STUDY;  Surgeon: Malissa Hippo, MD;  Location: [facility];  Service: Endoscopy;  Laterality: N/A;    History reviewed. No pertinent family history.  History  Substance Use Topics  . Smoking status: Never Smoker   . Smokeless tobacco: Never Used  . Alcohol Use: No    OB History    Grav Para Term Preterm Abortions TAB SAB Ect Mult Living                  Review of Systems  Gastrointestinal: Positive for vomiting and abdominal pain.  All other systems reviewed and are negative.    Allergies  Cephalexin; Dexlansoprazole; Latex; Other; Penicillins; Tape; Lovenox;  Nylon; Omeprazole; and Sulfonamide derivatives  Home Medications   Current Outpatient Rx  Name Route Sig Dispense Refill  . ACETAMINOPHEN 325 MG PO TABS Oral Take 650 mg by mouth every 6 (six) hours as needed. Pain    . ALPRAZOLAM 0.5 MG PO TABS Oral Take 0.5 mg by mouth daily.     . CYANOCOBALAMIN IJ Injection Inject 1,000 mcg as directed every 30 (thirty) days.      Marland Kitchen FAMOTIDINE 20 MG PO TABS Oral Take 20 mg by mouth at bedtime.    Marland Kitchen FLECAINIDE ACETATE 100 MG PO TABS Oral Take 100 mg by mouth every  12 (twelve) hours. **Takes at 630 am and 630pm**    . FOLIC ACID 1 MG PO TABS Oral Take 1 mg by mouth daily.      Marland Kitchen POLYSACCHARIDE IRON COMPLEX 150 MG PO CAPS Oral Take 150 mg by mouth every morning.     Marland Kitchen METOPROLOL TARTRATE 50 MG PO TABS Oral Take 1 tablet (50 mg total) by mouth 2 (two) times daily. 60 tablet 6  . POLYETHYLENE GLYCOL 3350 PO POWD  Take 17 grams once or twice daily per label instructions prn constipation 527 g 0  . WARFARIN SODIUM 2 MG PO TABS Oral Take 2 mg by mouth daily. Patient states that she takes 8 mg every other day , 7 mg the other days. **Takes at 6pm daily**    . WARFARIN SODIUM 5 MG PO TABS Oral Take 1 tablet (5 mg total) by mouth daily. Patient states that she is taking 8 mg every other day, 7 mg the other days      **TAKE 5 MG TONIGHT - 03/30/2011**    BP 123/60  Pulse 67  Temp(Src) 98.3 F (36.8 C) (Oral)  Resp 18  Ht 5\' 4"  (1.626 m)  Wt 235 lb (106.595 kg)  BMI 40.34 kg/m2  SpO2 98%  Physical Exam  Nursing note and vitals reviewed. Constitutional: She appears well-developed and well-nourished. No distress.  HENT:  Head: Normocephalic and atraumatic.  Mouth/Throat: No oropharyngeal exudate.       Mucous membranes dehydrated  Eyes: Conjunctivae and EOM are normal. Pupils are equal, round, and reactive to light. Right eye exhibits no discharge. Left eye exhibits no discharge. No scleral icterus.  Neck: Normal range of motion. Neck supple. No JVD present. No thyromegaly present.  Cardiovascular: Normal rate, regular rhythm, normal heart sounds and intact distal pulses.  Exam reveals no gallop and no friction rub.   No murmur heard. Pulmonary/Chest: Effort normal and breath sounds normal. No respiratory distress. She has no wheezes. She has no rales.  Abdominal: Soft. Bowel sounds are normal. She exhibits no distension and no mass. There is tenderness ( Minimal left upper quadrant tenderness without guarding, no peritoneal signs, no pain at McBurney's point  or right upper quadrant. No tympanitic sounds to percussion).  Musculoskeletal: Normal range of motion. She exhibits no edema and no tenderness.  Lymphadenopathy:    She has no cervical adenopathy.  Neurological: She is alert. Coordination normal.  Skin: Skin is warm and dry. No rash noted. No erythema.  Psychiatric: She has a normal mood and affect. Her behavior is normal.    ED Course  Procedures (including critical care time)  Labs Reviewed  COMPREHENSIVE METABOLIC PANEL - Abnormal; Notable for the following:    Glucose, Bld 140 (*)    All other components within normal limits  CBC - Abnormal; Notable for the following:    WBC  12.2 (*)    Hemoglobin 11.4 (*)    MCH 24.6 (*)    RDW 19.0 (*)    Platelets 418 (*)    All other components within normal limits  URINALYSIS, ROUTINE W REFLEX MICROSCOPIC - Abnormal; Notable for the following:    APPearance HAZY (*)    pH 8.5 (*)    All other components within normal limits  PROTIME-INR - Abnormal; Notable for the following:    Prothrombin Time 26.4 (*)    INR 2.38 (*)    All other components within normal limits   Ct Abdomen Pelvis W Contrast  04/03/2011  *RADIOLOGY REPORT*  Clinical Data: Upper abdominal pain, nausea, vomiting, recent small bowel resection  CT ABDOMEN AND PELVIS WITH CONTRAST  Technique:  Multidetector CT imaging of the abdomen and pelvis was performed following the standard protocol during bolus administration of intravenous contrast. Sagittal and coronal MPR images reconstructed from axial data set.  Contrast: OMNIPAQUE IOHEXOL 300 MG/ML IJ SOLN. Dilute oral contrast.  Comparison: 01/02/2011  Findings: Small right pleural effusion and bibasilar atelectasis. Contrast in distal esophagus question gastroesophageal reflux. Minimal perisplenic fluid. Liver, spleen, pancreas, and right adrenal gland normal appearance. Left adrenal mass 2.9 x 2.8 cm image 28 consistent with adrenal adenoma, stable. Symmetric nephrograms  with extrarenal pelves bilaterally.  Hazy infiltration of the mesentery question fibrosing mesenteritis versus postsurgical. Interval small bowel resection with dilatation of the small bowel loops proximal to the anastomosis and decompressed distal loops compatible with small bowel obstruction. Mild bowel wall thickening of a single small bowel loop in the left mid abdomen. Colon and distal small bowel normal appearance. Nonspecific free pelvic fluid.  Unremarkable bladder and ureters. Occluded left iliac vein with subcutaneous collaterals anteriorly again identified. No free intraperitoneal air or hernia. New nonspecific subcutaneous collection which is nodule anterior left mid abdomen 2.5 x 1.5 cm, of the intermediate attenuation. No acute osseous findings.  IMPRESSION: Small bowel obstruction at small bowel anastomosis. Single loop of small bowel in the left mid abdomen shows wall thickening, nonspecific; this can be seen with infection, inflammatory bowel disease and ischemia. Free intraperitoneal fluid. Small right pleural effusion and bibasilar atelectasis. Left adrenal adenoma.  Findings discussed by phone with Dr. Hyacinth Meeker on 04/03/2011 at 0520 hours.  Original Report Authenticated By: Lollie Marrow, M.D.   Dg Abd 2 Views  04/01/2011  *RADIOLOGY REPORT*  Clinical Data: Constipation.  No bowel movement 7 days.  ABDOMEN - 2 VIEW  Comparison: CT 01/02/2011  Findings: Normal bowel gas pattern.  Negative for bowel obstruction or free intraperitoneal gas.  No significant retained stool is present.  No renal calculi or bony abnormality.  Bilateral pleural effusions are present.  IMPRESSION: Negative for bowel obstruction or significant constipation.  Bilateral pleural effusions.  Original Report Authenticated By: Camelia Phenes, M.D.   Dg Abd Acute W/chest  04/03/2011  *RADIOLOGY REPORT*  Clinical Data: Left upper quadrant epigastric abdominal pain, nausea, vomiting  ACUTE ABDOMEN SERIES (ABDOMEN 2 VIEW & CHEST 1  VIEW)  Comparison: Abdominal radiographs 04/01/2011, chest radiograph 03/25/2011  Findings: Enlargement of cardiac silhouette. Mediastinal contours and pulmonary vascularity normal. Lungs appear mildly emphysematous but clear. No gross pleural effusion or pneumothorax. Bones diffusely demineralized. Paucity of bowel gas. No definite bowel dilatation, bowel wall thickening or evidence of obstruction. Stool present in proximal colon. Small pelvic phleboliths. Bones demineralized. No free intraperitoneal air.  IMPRESSION: Enlargement of cardiac silhouette. Emphysematous changes. No definite acute abdominal findings, with paucity  of bowel gas noted.  Original Report Authenticated By: Lollie Marrow, M.D.     1. Small bowel obstruction       MDM  Overall the patient is well appearing with normal vital signs including Filed Vitals:   04/03/11 0059  BP: 123/60  Pulse: 67  Temp: 98.3 F (36.8 C)  Resp: 18    She has minimal symptoms at this time, rule out small bowel obstruction given persistent left upper quadrant pain and recent surgery, check urinalysis for hydration status, labs, lites, reevaluate. 2 mg of intravenous morphine ordered for persistent pain  CT scan shows signs of small bowel obstruction at the anastomosis site, blood work consistent with elevated white blood cell count and leukocytosis, no renal dysfunction, no significant anemia and no urine infection.  I discussed care with Dr. Leticia Penna of general surgery who will admit the patient for further evaluation. I've ordered an NG tube to be placed in the emergency department and placed on low wall suction. Patient is tolerating interventions at this  Disposition: Admit        Vida Roller, MD 04/03/11 [phone]

## 2011-04-03 NOTE — H&P (Signed)
[name] is an 61 y.o. female.   Chief Complaint: Nausea vomiting and abdominal pain HPI: Patient presents approximately 24 hour history of increasing abdominal pain and nausea. She has been nonbloody emesis. Nausea is improved slightly since placement of a nasogastric tube in the emergency department earlier this morning. She describes her pain as colicky in nature. Her last bowel movement was noted to be 2 days ago and was reported as normal. She had no melena no hematochezia. She has been passing flatus. Denies any significant fevers or chills. No similar symptomatology since her small bowel operation several months ago.  Past Medical History  Diagnosis Date  . Vitamin B12 deficiency     vit b12 1000 mcg monthly  . Cellulitis of left leg 2006  . Ulcer 05/2009    esophageal  . Clotting disorder     heterozygosity from factor v leiden  . Pernicious anemia 07/10/2010  . DVT (deep venous thrombosis) 07/10/2010    on coumadin  . Factor V Leiden   . Anxiety   . 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 Allergic urticaria 01/04/2011    Rash from tape.  Marland Kitchen GERD (gastroesophageal reflux disease)   . Ventricular tachycardia 03/26/11    Past Surgical History  Procedure Date  . Abdominal hysterectomy 1989  . Balloon dilation 12/11/2010    Procedure: BALLOON DILATION;  Surgeon: Malissa Hippo, MD;  Location: [facility];  Service: Endoscopy;  Laterality: N/A;  . Laparotomy 01/05/2011    Procedure: EXPLORATORY LAPAROTOMY;  Surgeon: Dalia Heading;  Location: [facility];  Service: General;  Laterality: N/A;  . Bowel resection 01/05/2011    Procedure: SMALL BOWEL RESECTION;  Surgeon: Dalia Heading;  Location: [facility];  Service: General;;  Partial Small Bowel Resection  . Givens capsule study 01/02/2011    Procedure: GIVENS CAPSULE STUDY;  Surgeon: Malissa Hippo, MD;  Location: [facility];  Service: Endoscopy;  Laterality: N/A;    History reviewed. No pertinent family history. Social  History:  reports that she has never smoked. She has never used smokeless tobacco. She reports that she does not drink alcohol or use illicit drugs.  Allergies:  Allergies  Allergen Reactions  . Cephalexin Swelling  . Dexlansoprazole Swelling  . Latex Itching  . Other Swelling    Patient states that Pecans cause her mouth to swell.  Marland Kitchen Penicillins Swelling  . Tape Itching  . Lovenox Itching, Swelling and Palpitations  . Nylon Rash  . Omeprazole Itching and Rash  . Sulfonamide Derivatives Rash    Medications Prior to Admission  Medication Dose Route Frequency Provider Last Rate Last Dose  . 0.9 %  sodium chloride infusion   Intravenous Once Vida Roller, MD      . heparin ADULT infusion 100 units/mL (25000 units/250 mL)  1,600 Units/hr Intravenous Continuous Fabio Bering, MD 16 mL/hr at 04/03/11 1145 1,600 Units/hr at 04/03/11 1145  . iohexol (OMNIPAQUE) 300 MG/ML solution 100 mL  100 mL Intravenous Once PRN Medication Radiologist, MD   100 mL at 04/03/11 0501  . lactated ringers infusion   Intravenous Continuous Fabio Bering, MD      . LORazepam (ATIVAN) injection 1 mg  1 mg Intravenous Once Vida Roller, MD   1 mg at 04/03/11 0554  . LORazepam (ATIVAN) injection 1 mg  1 mg Intravenous Q6H PRN Fabio Bering, MD      . metoprolol (LOPRESSOR) injection 5  mg  5 mg Intravenous Q6H Fabio Bering, MD   5 mg at 04/03/11 1332  . morphine 2 MG/ML injection 1-4 mg  1-4 mg Intravenous Q4H PRN Fabio Bering, MD   2 mg at 04/03/11 1331  . morphine 4 MG/ML injection 2 mg  2 mg Intravenous Once Vida Roller, MD   2 mg at 04/03/11 0159  . morphine 4 MG/ML injection 4 mg  4 mg Intravenous Once Vida Roller, MD      . ondansetron [facility]) 4 MG/2ML injection           . ondansetron (ZOFRAN) injection 4 mg  4 mg Intravenous Q6H PRN Fabio Bering, MD      . pantoprazole (PROTONIX) injection 40 mg  40 mg Intravenous QHS Fabio Bering, MD   40 mg at 04/03/11 0912  . sodium chloride  0.9 % injection        10 mL at 04/03/11 0913  . sodium chloride 0.9 % injection           . DISCONTD: argatroban 1 mg/mL in sodium chloride 0.9 % 20 mL syringe  20 mg Intravenous Titrated Fabio Bering, MD       Medications Prior to Admission  Medication Sig Dispense Refill  . acetaminophen (TYLENOL) 325 MG tablet Take 650 mg by mouth every 6 (six) hours as needed. Pain      . ALPRAZolam (XANAX) 0.5 MG tablet Take 0.5 mg by mouth daily.       . CYANOCOBALAMIN IJ Inject 1,000 mcg as directed every 30 (thirty) days.        . famotidine (PEPCID) 20 MG tablet Take 20 mg by mouth at bedtime.      . flecainide (TAMBOCOR) 100 MG tablet Take 100 mg by mouth every 12 (twelve) hours. **Takes at 630 am and 630pm**      . folic acid (FOLVITE) 1 MG tablet Take 1 mg by mouth daily.        . iron polysaccharides (NIFEREX) 150 MG capsule Take 150 mg by mouth every morning.       . metoprolol (LOPRESSOR) 50 MG tablet Take 1 tablet (50 mg total) by mouth 2 (two) times daily.  60 tablet  6  . polyethylene glycol powder (GLYCOLAX/MIRALAX) powder Take 17 grams once or twice daily per label instructions prn constipation  527 g  0  . warfarin (COUMADIN) 2 MG tablet Take 2 mg by mouth daily. Patient states that she takes 8 mg every other day , 7 mg the other days. **Takes at 6pm daily**      . warfarin (COUMADIN) 5 MG tablet Take 1 tablet (5 mg total) by mouth daily. Patient states that she is taking 8 mg every other day, 7 mg the other days        Results for orders placed during the hospital encounter of 04/03/11 (from the past 48 hour(s))  COMPREHENSIVE METABOLIC PANEL     Status: Abnormal   Collection Time   04/03/11  2:01 AM      Component Value Range Comment   Sodium 139  135 - 145 (mEq/L)    Potassium 4.1  3.5 - 5.1 (mEq/L)    Chloride 104  96 - 112 (mEq/L)    CO2 25  19 - 32 (mEq/L)    Glucose, Bld 140 (*) 70 - 99 (mg/dL)    BUN 13  6 - 23 (mg/dL)    Creatinine, Ser 1.61  0.50 - 1.10 (mg/dL)     Calcium 9.2  8.4 - 10.5 (mg/dL)    Total Protein 7.6  6.0 - 8.3 (g/dL)    Albumin 3.6  3.5 - 5.2 (g/dL)    AST 17  0 - 37 (U/L)    ALT 20  0 - 35 (U/L)    Alkaline Phosphatase 112  39 - 117 (U/L)    Total Bilirubin 0.3  0.3 - 1.2 (mg/dL)    GFR calc non Af Amer >90  >90 (mL/min)    GFR calc Af Amer >90  >90 (mL/min)   CBC     Status: Abnormal   Collection Time   04/03/11  2:01 AM      Component Value Range Comment   WBC 12.2 (*) 4.0 - 10.5 (K/uL)    RBC 4.63  3.87 - 5.11 (MIL/uL)    Hemoglobin 11.4 (*) 12.0 - 15.0 (g/dL)    HCT 29.5  62.1 - 30.8 (%)    MCV 79.7  78.0 - 100.0 (fL)    MCH 24.6 (*) 26.0 - 34.0 (pg)    MCHC 30.9  30.0 - 36.0 (g/dL)    RDW 65.7 (*) 84.6 - 15.5 (%)    Platelets 418 (*) 150 - 400 (K/uL)   URINALYSIS, ROUTINE W REFLEX MICROSCOPIC     Status: Abnormal   Collection Time   04/03/11  2:59 AM      Component Value Range Comment   Color, Urine YELLOW  YELLOW     APPearance HAZY (*) CLEAR     Specific Gravity, Urine 1.015  1.005 - 1.030     pH 8.5 (*) 5.0 - 8.0     Glucose, UA NEGATIVE  NEGATIVE (mg/dL)    Hgb urine dipstick NEGATIVE  NEGATIVE     Bilirubin Urine NEGATIVE  NEGATIVE     Ketones, ur NEGATIVE  NEGATIVE (mg/dL)    Protein, ur NEGATIVE  NEGATIVE (mg/dL)    Urobilinogen, UA 0.2  0.0 - 1.0 (mg/dL)    Nitrite NEGATIVE  NEGATIVE     Leukocytes, UA NEGATIVE  NEGATIVE  MICROSCOPIC NOT DONE ON URINES WITH NEGATIVE PROTEIN, BLOOD, LEUKOCYTES, NITRITE, OR GLUCOSE <1000 mg/dL.  PROTIME-INR     Status: Abnormal   Collection Time   04/03/11  5:32 AM      Component Value Range Comment   Prothrombin Time 26.4 (*) 11.6 - 15.2 (seconds)    INR 2.38 (*) 0.00 - 1.49     Ct Abdomen Pelvis W Contrast  04/03/2011  *RADIOLOGY REPORT*  Clinical Data: Upper abdominal pain, nausea, vomiting, recent small bowel resection  CT ABDOMEN AND PELVIS WITH CONTRAST  Technique:  Multidetector CT imaging of the abdomen and pelvis was performed following the standard protocol during  bolus administration of intravenous contrast. Sagittal and coronal MPR images reconstructed from axial data set.  Contrast: OMNIPAQUE IOHEXOL 300 MG/ML IJ SOLN. Dilute oral contrast.  Comparison: 01/02/2011  Findings: Small right pleural effusion and bibasilar atelectasis. Contrast in distal esophagus question gastroesophageal reflux. Minimal perisplenic fluid. Liver, spleen, pancreas, and right adrenal gland normal appearance. Left adrenal mass 2.9 x 2.8 cm image 28 consistent with adrenal adenoma, stable. Symmetric nephrograms with extrarenal pelves bilaterally.  Hazy infiltration of the mesentery question fibrosing mesenteritis versus postsurgical. Interval small bowel resection with dilatation of the small bowel loops proximal to the anastomosis and decompressed distal loops compatible with small bowel obstruction. Mild bowel wall thickening of a single small bowel loop in the  left mid abdomen. Colon and distal small bowel normal appearance. Nonspecific free pelvic fluid.  Unremarkable bladder and ureters. Occluded left iliac vein with subcutaneous collaterals anteriorly again identified. No free intraperitoneal air or hernia. New nonspecific subcutaneous collection which is nodule anterior left mid abdomen 2.5 x 1.5 cm, of the intermediate attenuation. No acute osseous findings.  IMPRESSION: Small bowel obstruction at small bowel anastomosis. Single loop of small bowel in the left mid abdomen shows wall thickening, nonspecific; this can be seen with infection, inflammatory bowel disease and ischemia. Free intraperitoneal fluid. Small right pleural effusion and bibasilar atelectasis. Left adrenal adenoma.  Findings discussed by phone with Dr. Hyacinth Meeker on 04/03/2011 at 0520 hours.  Original Report Authenticated By: Lollie Marrow, M.D.   Dg Abd 2 Views  04/01/2011  *RADIOLOGY REPORT*  Clinical Data: Constipation.  No bowel movement 7 days.  ABDOMEN - 2 VIEW  Comparison: CT 01/02/2011  Findings: Normal bowel  gas pattern.  Negative for bowel obstruction or free intraperitoneal gas.  No significant retained stool is present.  No renal calculi or bony abnormality.  Bilateral pleural effusions are present.  IMPRESSION: Negative for bowel obstruction or significant constipation.  Bilateral pleural effusions.  Original Report Authenticated By: Camelia Phenes, M.D.   Dg Abd Acute W/chest  04/03/2011  *RADIOLOGY REPORT*  Clinical Data: Left upper quadrant epigastric abdominal pain, nausea, vomiting  ACUTE ABDOMEN SERIES (ABDOMEN 2 VIEW & CHEST 1 VIEW)  Comparison: Abdominal radiographs 04/01/2011, chest radiograph 03/25/2011  Findings: Enlargement of cardiac silhouette. Mediastinal contours and pulmonary vascularity normal. Lungs appear mildly emphysematous but clear. No gross pleural effusion or pneumothorax. Bones diffusely demineralized. Paucity of bowel gas. No definite bowel dilatation, bowel wall thickening or evidence of obstruction. Stool present in proximal colon. Small pelvic phleboliths. Bones demineralized. No free intraperitoneal air.  IMPRESSION: Enlargement of cardiac silhouette. Emphysematous changes. No definite acute abdominal findings, with paucity of bowel gas noted.  Original Report Authenticated By: Lollie Marrow, M.D.    Review of Systems  Constitutional: Negative for fever and chills.  HENT: Negative.   Eyes: Negative.   Respiratory: Negative.   Cardiovascular: Negative.   Gastrointestinal: Positive for nausea, vomiting and abdominal pain (diffuse but mostly epigastric). Negative for diarrhea, constipation, blood in stool and melena.  Genitourinary: Negative.   Musculoskeletal: Negative.   Skin: Negative.   Neurological: Negative.   Endo/Heme/Allergies: Negative.   Psychiatric/Behavioral: Negative.     Blood pressure 153/81, pulse 60, temperature 97.7 F (36.5 C), temperature source Oral, resp. rate 20, height 5' 7.5" (1.715 m), weight 104.237 kg (229 lb 12.8 oz), SpO2  90.00%. Physical Exam  Constitutional: She is oriented to person, place, and time. She appears well-developed and well-nourished. No distress.  HENT:  Head: Normocephalic and atraumatic.  Eyes: Conjunctivae and EOM are normal. Pupils are equal, round, and reactive to light.  Neck: Normal range of motion. Neck supple. No tracheal deviation present. No thyromegaly present.  Cardiovascular: Normal rate and normal heart sounds.        Occasional early beat.  Respiratory: Effort normal. No respiratory distress. She has no wheezes.  GI: Soft. She exhibits no distension and no mass. There is tenderness (Mild diffuse tenderness. Moderate right and left upper quadrant tenderness.). There is no rebound and no guarding.       Obese, diminished bowel sounds  Lymphadenopathy:    She has no cervical adenopathy.  Neurological: She is alert and oriented to person, place, and time.  Skin: Skin is  warm and dry.     Assessment/Plan Partial small bowel obstruction. At this point patient will be admitted for continued IV fluid hydration. Continued n.p.o. status. Continue nasogastric decompression and bowel rest. Patient has a history of being hypercoagulable and is on chronic Coumadin. This will be positioned over 2 a heparin drip. Pain control and anti-emetics will be administered. Surgical indications were discussed with patient.  Maxen Rowland C 04/03/2011, 1:50 PM

## 2011-04-03 NOTE — ED Notes (Signed)
MD at bedside. 

## 2011-04-03 NOTE — ED Notes (Signed)
Pt transported to floor in stable condition, report called to [employer].

## 2011-04-03 NOTE — ED Notes (Signed)
1L LR up to infuse at 113ml/hr via pump per MD

## 2011-04-04 LAB — CBC
Hemoglobin: 10 g/dL — ABNORMAL LOW (ref 12.0–15.0)
MCHC: 30.3 g/dL (ref 30.0–36.0)
RBC: 4.1 MIL/uL (ref 3.87–5.11)
WBC: 7.6 10*3/uL (ref 4.0–10.5)

## 2011-04-04 LAB — HEPARIN LEVEL (UNFRACTIONATED): Heparin Unfractionated: 0.58 IU/mL (ref 0.30–0.70)

## 2011-04-04 MED ORDER — PHENOL 1.4 % MT LIQD
1.0000 | OROMUCOSAL | Status: DC | PRN
  Administered 2011-04-04: 1 via OROMUCOSAL
  Filled 2011-04-04: qty 177

## 2011-04-04 NOTE — Progress Notes (Signed)
Subjective: No nausea. No dominant pain. Still passing flatus. No bowel movement. No fevers chills  Objective: Vital signs in last 24 hours: Temp:  [97.9 F (36.6 C)-98.6 F (37 C)] 98.6 F (37 C) (03/09 1100) Pulse Rate:  [59-68] 66  (03/09 1100) Resp:  [16-20] 16  (03/09 1100) BP: (122-145)/(68-80) 145/80 mmHg (03/09 1100) SpO2:  [89 %-93 %] 90 % (03/09 1100) Last BM Date: 04/02/11  Intake/Output from previous day: 03/08 0701 - 03/09 0700 In: 0  Out: 400 [Urine:300] Intake/Output this shift:    General appearance: alert and no distress GI: Intermittent bowel sounds, soft, obese, nontender, mild distention. No diffuse peritoneal signs. No hernias or masses.  Lab Results:   Basename 04/04/11 0532 04/03/11 0201  WBC 7.6 12.2*  HGB 10.0* 11.4*  HCT 33.0* 36.9  PLT 356 418*   BMET  Basename 04/03/11 0201 04/01/11 2120  NA 139 141  K 4.1 4.2  CL 104 106  CO2 25 23  GLUCOSE 140* 126*  BUN 13 14  CREATININE 0.72 0.74  CALCIUM 9.2 10.0   PT/INR  Basename 04/03/11 0532  LABPROT 26.4*  INR 2.38*   ABG No results found for this basename: PHART:2,PCO2:2,PO2:2,HCO3:2 in the last 72 hours  Studies/Results: Ct Abdomen Pelvis W Contrast  04/03/2011  *RADIOLOGY REPORT*  Clinical Data: Upper abdominal pain, nausea, vomiting, recent small bowel resection  CT ABDOMEN AND PELVIS WITH CONTRAST  Technique:  Multidetector CT imaging of the abdomen and pelvis was performed following the standard protocol during bolus administration of intravenous contrast. Sagittal and coronal MPR images reconstructed from axial data set.  Contrast: OMNIPAQUE IOHEXOL 300 MG/ML IJ SOLN. Dilute oral contrast.  Comparison: 01/02/2011  Findings: Small right pleural effusion and bibasilar atelectasis. Contrast in distal esophagus question gastroesophageal reflux. Minimal perisplenic fluid. Liver, spleen, pancreas, and right adrenal gland normal appearance. Left adrenal mass 2.9 x 2.8 cm image 28  consistent with adrenal adenoma, stable. Symmetric nephrograms with extrarenal pelves bilaterally.  Hazy infiltration of the mesentery question fibrosing mesenteritis versus postsurgical. Interval small bowel resection with dilatation of the small bowel loops proximal to the anastomosis and decompressed distal loops compatible with small bowel obstruction. Mild bowel wall thickening of a single small bowel loop in the left mid abdomen. Colon and distal small bowel normal appearance. Nonspecific free pelvic fluid.  Unremarkable bladder and ureters. Occluded left iliac vein with subcutaneous collaterals anteriorly again identified. No free intraperitoneal air or hernia. New nonspecific subcutaneous collection which is nodule anterior left mid abdomen 2.5 x 1.5 cm, of the intermediate attenuation. No acute osseous findings.  IMPRESSION: Small bowel obstruction at small bowel anastomosis. Single loop of small bowel in the left mid abdomen shows wall thickening, nonspecific; this can be seen with infection, inflammatory bowel disease and ischemia. Free intraperitoneal fluid. Small right pleural effusion and bibasilar atelectasis. Left adrenal adenoma.  Findings discussed by phone with Dr. Hyacinth Meeker on 04/03/2011 at 0520 hours.  Original Report Authenticated By: Lollie Marrow, M.D.   Dg Abd Acute W/chest  04/03/2011  *RADIOLOGY REPORT*  Clinical Data: Left upper quadrant epigastric abdominal pain, nausea, vomiting  ACUTE ABDOMEN SERIES (ABDOMEN 2 VIEW & CHEST 1 VIEW)  Comparison: Abdominal radiographs 04/01/2011, chest radiograph 03/25/2011  Findings: Enlargement of cardiac silhouette. Mediastinal contours and pulmonary vascularity normal. Lungs appear mildly emphysematous but clear. No gross pleural effusion or pneumothorax. Bones diffusely demineralized. Paucity of bowel gas. No definite bowel dilatation, bowel wall thickening or evidence of obstruction. Stool present in  proximal colon. Small pelvic phleboliths. Bones  demineralized. No free intraperitoneal air.  IMPRESSION: Enlargement of cardiac silhouette. Emphysematous changes. No definite acute abdominal findings, with paucity of bowel gas noted.  Original Report Authenticated By: Lollie Marrow, M.D.    Anti-infectives: Anti-infectives    None      Assessment/Plan: s/p * No surgery found * Partial small bowel obstruction. At this point continue conservative management IV fluid hydration DVT prophylaxis and bowel rest. Continue NG tube for now. Will add Chloraseptic Spray first sore throat and throat irritation. Encouraged patient to increase ambulation walking  LOS: 1 day    Jaclynn Laumann C 04/04/2011

## 2011-04-04 NOTE — Progress Notes (Signed)
ANTICOAGULATION CONSULT NOTE   Pharmacy Consult for Heparin Indication: DVT (while off Warfarin-surgery workup in progress)  Allergies  Allergen Reactions  . Cephalexin Swelling  . Dexlansoprazole Swelling  . Latex Itching  . Other Swelling    Patient states that Pecans cause her mouth to swell.  Marland Kitchen Penicillins Swelling  . Tape Itching  . Lovenox Itching, Swelling and Palpitations  . Nylon Rash  . Omeprazole Itching and Rash  . Sulfonamide Derivatives Rash   Patient Measurements: Height: 5' 7.5" (171.5 cm) Weight: 229 lb 12.8 oz (104.237 kg) IBW/kg (Calculated) : 62.75  Heparin Dosing Weight: 100 kg  Vital Signs: Temp: 98.4 F (36.9 C) (03/09 0500) Temp src: Oral (03/09 0500) BP: 122/72 mmHg (03/09 0500) Pulse Rate: 68  (03/09 0500)  Labs:  Basename 04/04/11 0532 04/03/11 1845 04/03/11 0532 04/03/11 0201 04/01/11 2120 04/01/11 1036  HGB 10.0* -- -- 11.4* -- --  HCT 33.0* -- -- 36.9 -- --  PLT 356 -- -- 418* -- --  APTT -- -- -- -- -- --  LABPROT -- -- 26.4* -- -- 25.8*  INR -- -- 2.38* -- -- 2.31*  HEPARINUNFRC 0.58 0.40 -- -- -- --  CREATININE -- -- -- 0.72 0.74 --  CKTOTAL -- -- -- -- -- --  CKMB -- -- -- -- -- --  TROPONINI -- -- -- -- -- --   Estimated Creatinine Clearance: 94.9 ml/min (by C-G formula based on Cr of 0.72).  Medical History: Past Medical History  Diagnosis Date  . Vitamin B12 deficiency     vit b12 1000 mcg monthly  . Cellulitis of left leg 2006  . Ulcer 05/2009    esophageal  . Clotting disorder     heterozygosity from factor v leiden  . Pernicious anemia 07/10/2010  . DVT (deep venous thrombosis) 07/10/2010    on coumadin  . Factor V Leiden   . Anxiety   . 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 Allergic urticaria 01/04/2011    Rash from tape.  Marland Kitchen GERD (gastroesophageal reflux disease)   . Ventricular tachycardia 03/26/11   Medications:  Scheduled:     . metoprolol  5 mg Intravenous Q6H  . morphine  4 mg Intravenous Once    . pantoprazole (PROTONIX) IV  40 mg Intravenous QHS  . sodium chloride      . sodium chloride       Assessment: Transitioning to full anticoagulation while NPO. Was on Heparin recently at [facility]. Patient tolerated Heparin without problems. Heparin level is therapeutic  Goal of Therapy:  Heparin level 0.3-0.7 units/ml   Plan:  Continue Heparin at 1600 units per hour.   Valrie Hart A 04/04/2011,8:46 AM

## 2011-04-05 LAB — BASIC METABOLIC PANEL
CO2: 26 mEq/L (ref 19–32)
Calcium: 9.1 mg/dL (ref 8.4–10.5)
GFR calc non Af Amer: >90 mL/min (ref >90)
Glucose, Bld: 85 mg/dL (ref 70–99)
Potassium: 3.3 mEq/L — ABNORMAL LOW (ref 3.5–5.1)
Sodium: 137 mEq/L (ref 135–145)

## 2011-04-05 LAB — HEPARIN LEVEL (UNFRACTIONATED): Heparin Unfractionated: 0.46 IU/mL (ref 0.30–0.70)

## 2011-04-05 LAB — CBC
HCT: 34.7 % — ABNORMAL LOW (ref 36.0–46.0)
Hemoglobin: 10.3 g/dL — ABNORMAL LOW (ref 12.0–15.0)
MCV: 80.7 fL (ref 78.0–100.0)
RBC: 4.3 MIL/uL (ref 3.87–5.11)
RDW: 18.3 % — ABNORMAL HIGH (ref 11.5–15.5)
WBC: 8.1 10*3/uL (ref 4.0–10.5)

## 2011-04-05 LAB — PROTIME-INR: INR: 1.96 — ABNORMAL HIGH (ref 0.00–1.49)

## 2011-04-05 NOTE — Progress Notes (Signed)
  Subjective: Nausea and abdominal pain have improved significantly. She still passing flatus. No bowel movement today. No fevers or chills.  Objective: Vital signs in last 24 hours: Temp:  [97.8 F (36.6 C)-98.1 F (36.7 C)] 98.1 F (36.7 C) (03/10 1100) Pulse Rate:  [61-76] 71  (03/10 1100) Resp:  [16-20] 20  (03/10 1043) BP: (147-169)/(72-85) 152/76 mmHg (03/10 1100) SpO2:  [90 %-93 %] 93 % (03/10 1100) Last BM Date: 04/02/11  Intake/Output from previous day: 03/09 0701 - 03/10 0700 In: 5950.7 [I.V.:5940.7; IV Piggyback:10] Out: 780 [Urine:200; Emesis/NG output:230] Intake/Output this shift:    General appearance: alert, cooperative and no distress GI: Intermittent bowel sounds, soft, obese, mild left-sided abdominal tenderness. No diffuse peritoneal signs. No hernias or masses per  Lab Results:   Basename 04/05/11 0623 04/04/11 0532  WBC 8.1 7.6  HGB 10.3* 10.0*  HCT 34.7* 33.0*  PLT 342 356   BMET  Basename 04/05/11 0623 04/03/11 0201  NA 137 139  K 3.3* 4.1  CL 101 104  CO2 26 25  GLUCOSE 85 140*  BUN 6 13  CREATININE 0.56 0.72  CALCIUM 9.1 9.2   PT/INR  Basename 04/05/11 0623 04/03/11 0532  LABPROT 22.7* 26.4*  INR 1.96* 2.38*   ABG No results found for this basename: PHART:2,PCO2:2,PO2:2,HCO3:2 in the last 72 hours  Studies/Results: No results found.  Anti-infectives: Anti-infectives    None      Assessment/Plan: s/p * No surgery found * Partial small bowel obstruction. At this point we'll continue conservative management he continued bowel rest. We'll continue to monitor nasogastric output and watch for improved bowel function. I did encourage patient to continue increasing activity as tolerated. Parenteral nutrition was discussed at this point a feel that the patient currently requires supplementation. If patient continues not to progress early in the week we have discussed redressing surgical intervention.  LOS: 2 days    Deyna Carbon  C 04/05/2011

## 2011-04-05 NOTE — Progress Notes (Signed)
ANTICOAGULATION CONSULT NOTE   Pharmacy Consult for Heparin Indication: DVT (while off Warfarin-surgery workup in progress)  Allergies  Allergen Reactions  . Cephalexin Swelling  . Dexlansoprazole Swelling  . Latex Itching  . Other Swelling    Patient states that Pecans cause her mouth to swell.  Marland Kitchen Penicillins Swelling  . Tape Itching  . Lovenox Itching, Swelling and Palpitations  . Nylon Rash  . Omeprazole Itching and Rash  . Sulfonamide Derivatives Rash   Patient Measurements: Height: 5' 7.5" (171.5 cm) Weight: 229 lb 12.8 oz (104.237 kg) IBW/kg (Calculated) : 62.75  Heparin Dosing Weight: 100 kg  Vital Signs: Temp: 98.1 F (36.7 C) (03/10 1100) Temp src: Oral (03/10 1043) BP: 152/76 mmHg (03/10 1100) Pulse Rate: 71  (03/10 1100)  Labs:  Basename 04/05/11 0623 04/04/11 0532 04/03/11 1845 04/03/11 0532 04/03/11 0201  HGB 10.3* 10.0* -- -- --  HCT 34.7* 33.0* -- -- 36.9  PLT 342 356 -- -- 418*  APTT -- -- -- -- --  LABPROT 22.7* -- -- 26.4* --  INR 1.96* -- -- 2.38* --  HEPARINUNFRC 0.46 0.58 0.40 -- --  CREATININE 0.56 -- -- -- 0.72  CKTOTAL -- -- -- -- --  CKMB -- -- -- -- --  TROPONINI -- -- -- -- --   Estimated Creatinine Clearance: 94.9 ml/min (by C-G formula based on Cr of 0.56).  Medical History: Past Medical History  Diagnosis Date  . Vitamin B12 deficiency     vit b12 1000 mcg monthly  . Cellulitis of left leg 2006  . Ulcer 05/2009    esophageal  . Clotting disorder     heterozygosity from factor v leiden  . Pernicious anemia 07/10/2010  . DVT (deep venous thrombosis) 07/10/2010    on coumadin  . Factor V Leiden   . Anxiety   . 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 Allergic urticaria 01/04/2011    Rash from tape.  Marland Kitchen GERD (gastroesophageal reflux disease)   . Ventricular tachycardia 03/26/11   Medications:  Scheduled:     . metoprolol  5 mg Intravenous Q6H  . morphine  4 mg Intravenous Once  . pantoprazole (PROTONIX) IV  40 mg  Intravenous QHS   Assessment: full anticoagulation with IV Heparin while NPO. Was on Heparin recently at [facility]. Patient tolerated Heparin without problems. Heparin level is therapeutic  Goal of Therapy:  Heparin level 0.3-0.7 units/ml   Plan:  Continue Heparin at 1600 units per hour. CBC per protocol  Valrie Hart A 04/05/2011,12:03 PM

## 2011-04-06 ENCOUNTER — Inpatient Hospital Stay (HOSPITAL_COMMUNITY): Payer: Medicare Other

## 2011-04-06 ENCOUNTER — Other Ambulatory Visit (HOSPITAL_COMMUNITY): Payer: Medicare Other

## 2011-04-06 LAB — BASIC METABOLIC PANEL
Calcium: 9 mg/dL (ref 8.4–10.5)
GFR calc non Af Amer: >90 mL/min (ref >90)
Potassium: 3.5 mEq/L (ref 3.5–5.1)
Sodium: 138 mEq/L (ref 135–145)

## 2011-04-06 LAB — CBC
HCT: 33.8 % — ABNORMAL LOW (ref 36.0–46.0)
Platelets: 300 10*3/uL (ref 150–400)
RBC: 4.19 MIL/uL (ref 3.87–5.11)
RDW: 18 % — ABNORMAL HIGH (ref 11.5–15.5)
WBC: 6.9 10*3/uL (ref 4.0–10.5)

## 2011-04-06 LAB — PROTIME-INR: INR: 1.79 — ABNORMAL HIGH (ref 0.00–1.49)

## 2011-04-06 LAB — HEPARIN LEVEL (UNFRACTIONATED): Heparin Unfractionated: 0.8 IU/mL — ABNORMAL HIGH (ref 0.30–0.70)

## 2011-04-06 NOTE — Progress Notes (Signed)
  Subjective: No nausea or abdominal pain.  Still with flatus but no BM.  No fever or chills  Objective: Vital signs in last 24 hours: Temp:  [97.3 F (36.3 C)-98.2 F (36.8 C)] 97.5 F (36.4 C) (03/11 0822) Pulse Rate:  [65-76] 74  (03/11 0822) Resp:  [16-20] 18  (03/11 0822) BP: (136-170)/(72-78) 136/72 mmHg (03/11 0822) SpO2:  [90 %-94 %] 91 % (03/11 0822) Last BM Date: 04/02/11  Intake/Output from previous day: 03/10 0701 - 03/11 0700 In: 2866 [I.V.:2866] Out: 1000 [Emesis/NG output:1000] Intake/Output this shift:    General appearance: alert and no distress GI: Intermittent bowel sounds.  Soft, NT, ND.  No hernia.  Lab Results:   [facility] 04/06/11 0614 04/05/11 0623  WBC 6.9 8.1  HGB 10.1* 10.3*  HCT 33.8* 34.7*  PLT 300 342   BMET  Basename 04/06/11 0614 04/05/11 0623  NA 138 137  K 3.5 3.3*  CL 101 101  CO2 23 26  GLUCOSE 79 85  BUN 7 6  CREATININE 0.56 0.56  CALCIUM 9.0 9.1   PT/INR  Basename 04/06/11 0614 04/05/11 0623  LABPROT 21.1* 22.7*  INR 1.79* 1.96*   ABG No results found for this basename: PHART:2,PCO2:2,PO2:2,HCO3:2 in the last 72 hours  Studies/Results: No results found.  Anti-infectives: Anti-infectives    None      Assessment/Plan: s/p * No surgery found * Partial SBO.  No significant progression.  Discussed with patient options.  Will check SBFT and if still demonstrating focal area of narrowing, will plan to proceed to OR in next 24-48 hours.    LOS: 3 days    Hutson Luft C 04/06/2011

## 2011-04-06 NOTE — Clinical Documentation Improvement (Signed)
POA DOCUMENTATION CLARIFICATION QUERY  THIS DOCUMENT IS NOT A PERMANENT PART OF THE MEDICAL RECORD  TO RESPOND TO THE THIS QUERY, FOLLOW THE INSTRUCTIONS BELOW:  1. If needed, update documentation for the patient's encounter via the notes activity.  2. Access this query again and click edit on the In [employer].  3. After updating, or not, click F2 to complete all highlighted (required) fields concerning your review. Select "additional documentation in the medical record" OR "no additional documentation provided".  4. Click Sign note button.  5. The deficiency will fall out of your In Basket *Please let us know if you are not able to complete this workflow by phone or e-mail (listed below).  Please update your documentation within the medical record to reflect your response to this query.                                                                                    04/06/11  Dear Dr. Leonard Schwartz. [facility]  In a better effort to capture your patient's severity of illness, reflect appropriate length of stay and utilization of resources, a review of the patient medical record has revealed the following indicators.    Based on your clinical judgment, please clarify and document in a progress note and/or discharge summary the clinical condition associated with the following supporting information:  In responding to this query please exercise your independent judgment.  The fact that a query is asked, does not imply that any particular answer is desired or expected.  Medicare rules require specification as to whether an inpatient diagnosis was present at the time of admission.   Noted patient has h/o of diagnosis of "Factor V Leiden", please clarify if this is a diagnosis that was evaluated or monitored during this hospitalization if so please document as a secondary diagnosis  /or if it was present on admission.  Thank you     Please clarify if the following diagnosis,   Factor V Leiden was:     _ Present at the time of admission  _ NOT present at the time of inpatient admission and it developed during the inpatient stay  _ Unable to clinically determine whether the condition was present on admission.  _  Documentation insufficient to determine if condition was present at the time of inpatient admission  You may use possible, probable, or suspect with inpatient documentation. possible, probable, suspected diagnoses MUST be documented at the time of discharge  Reviewed: additional documentation in the medical record      Thank You,  Leonette Most Addison  Clinical Documentation Specialist RN, BSN:  Pager [phone] HIM off [phone]  Health Information Management [place]   As stated in the update, the patient has a known history of Factor V Leiden syndrome.  It did not effect LOS.  It did change management due to heparin drip.

## 2011-04-06 NOTE — Progress Notes (Signed)
ANTICOAGULATION CONSULT NOTE   Pharmacy Consult for Heparin Indication: DVT (while off Warfarin-surgery workup in progress)  Allergies  Allergen Reactions  . Cephalexin Swelling  . Dexlansoprazole Swelling  . Latex Itching  . Other Swelling    Patient states that Pecans cause her mouth to swell.  Marland Kitchen Penicillins Swelling  . Tape Itching  . Lovenox Itching, Swelling and Palpitations  . Nylon Rash  . Omeprazole Itching and Rash  . Sulfonamide Derivatives Rash   Patient Measurements: Height: 5' 7.5" (171.5 cm) Weight: 229 lb 12.8 oz (104.237 kg) IBW/kg (Calculated) : 62.75  Heparin Dosing Weight: 100 kg  Vital Signs: Temp: 97.5 F (36.4 C) (03/11 0822) Temp src: Oral (03/11 0614) BP: 136/72 mmHg (03/11 0822) Pulse Rate: 74  (03/11 0822)  Labs:  Basename 04/06/11 8469 04/05/11 0623 04/04/11 0532  HGB 10.1* 10.3* --  HCT 33.8* 34.7* 33.0*  PLT 300 342 356  APTT -- -- --  LABPROT 21.1* 22.7* --  INR 1.79* 1.96* --  HEPARINUNFRC 0.80* 0.46 0.58  CREATININE 0.56 0.56 --  CKTOTAL -- -- --  CKMB -- -- --  TROPONINI -- -- --   Estimated Creatinine Clearance: 94.9 ml/min (by C-G formula based on Cr of 0.56).  Medical History: Past Medical History  Diagnosis Date  . Vitamin B12 deficiency     vit b12 1000 mcg monthly  . Cellulitis of left leg 2006  . Ulcer 05/2009    esophageal  . Clotting disorder     heterozygosity from factor v leiden  . Pernicious anemia 07/10/2010  . DVT (deep venous thrombosis) 07/10/2010    on coumadin  . Factor V Leiden   . Anxiety   . 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 Allergic urticaria 01/04/2011    Rash from tape.  Marland Kitchen GERD (gastroesophageal reflux disease)   . Ventricular tachycardia 03/26/11   Medications:  Scheduled:     . metoprolol  5 mg Intravenous Q6H  . morphine  4 mg Intravenous Once  . pantoprazole (PROTONIX) IV  40 mg Intravenous QHS   Assessment: full anticoagulation with IV Heparin while NPO. Was on Heparin  recently at [facility]. Patient tolerated Heparin without problems. Heparin level slightly above goal.  Goal of Therapy:  Heparin level 0.3-0.7 units/ml   Plan:  Decrease Heparin to 1500 units per hour. Daily Heparin Level & CBC per protocol.  Mady Gemma 04/06/2011,9:47 AM

## 2011-04-06 NOTE — Progress Notes (Signed)
   CARE MANAGEMENT NOTE 04/06/2011  Patient:  [name], [name]   Account Number:  [id number]  Date Initiated:  04/06/2011  Documentation initiated by:  Sharrie Rothman  Subjective/Objective Assessment:   PT admitted from home with vomiting and abd pain.Has partial SBO. Pt has NG tube. Surgery feels surgery is not needed at this time.  Pt lives with husband. Independent with ADL's.     Action/Plan:   CM spoke with pt about any possible needs with discharge.   Anticipated DC Date:  04/10/2011   Anticipated DC Plan:  HOME/SELF CARE      DC Planning Services  CM consult      Choice offered to / List presented to:             Status of service:  In process, will continue to follow Medicare Important Message given?   (If response is "NO", the following Medicare IM given date fields will be blank) Date Medicare IM given:   Date Additional Medicare IM given:    Discharge Disposition:  HOME/SELF CARE  Per UR Regulation:    Comments:  04-06-11 Arlyss Queen, RN, BSN, Care Manager [phone] Pt admitted with nausea, vomiting, partial SBO. Has NG tube. No surgery needed at this time. Pt/CM does not anticipate any discharge needs at this time. Will continue to follow.

## 2011-04-07 LAB — PROTIME-INR
INR: 1.66 — ABNORMAL HIGH (ref 0.00–1.49)
Prothrombin Time: 19.9 seconds — ABNORMAL HIGH (ref 11.6–15.2)

## 2011-04-07 LAB — BASIC METABOLIC PANEL
Calcium: 9.1 mg/dL (ref 8.4–10.5)
GFR calc Af Amer: >90 mL/min (ref >90)
GFR calc non Af Amer: >90 mL/min (ref >90)
Sodium: 136 mEq/L (ref 135–145)

## 2011-04-07 LAB — CBC
Platelets: 315 10*3/uL (ref 150–400)
RBC: 4.08 MIL/uL (ref 3.87–5.11)
RDW: 18 % — ABNORMAL HIGH (ref 11.5–15.5)
WBC: 5.1 10*3/uL (ref 4.0–10.5)

## 2011-04-07 LAB — HEPARIN LEVEL (UNFRACTIONATED): Heparin Unfractionated: 0.65 IU/mL (ref 0.30–0.70)

## 2011-04-07 MED ORDER — SODIUM CHLORIDE 0.9 % IJ SOLN
INTRAMUSCULAR | Status: AC
  Administered 2011-04-07: 19:00:00
  Filled 2011-04-07: qty 3

## 2011-04-07 MED ORDER — CHLORHEXIDINE GLUCONATE 0.12 % MT SOLN
15.0000 mL | Freq: Two times a day (BID) | OROMUCOSAL | Status: DC
  Administered 2011-04-07 – 2011-04-08 (×4): 15 mL via OROMUCOSAL
  Filled 2011-04-07 (×4): qty 15

## 2011-04-07 MED ORDER — BIOTENE DRY MOUTH MT LIQD
15.0000 mL | Freq: Two times a day (BID) | OROMUCOSAL | Status: DC
  Administered 2011-04-07 (×2): 15 mL via OROMUCOSAL

## 2011-04-07 NOTE — Progress Notes (Signed)
  Subjective: Nausea still controlled. No abdominal pain. Tolerating clears. Positive bowel function.  Objective: Vital signs in last 24 hours: Temp:  [97.5 F (36.4 C)-98.1 F (36.7 C)] 97.7 F (36.5 C) (03/12 1216) Pulse Rate:  [56-77] 61  (03/12 1216) Resp:  [20] 20  (03/12 1216) BP: (137-153)/(72-80) 153/80 mmHg (03/12 1216) SpO2:  [94 %-98 %] 98 % (03/12 1216) Last BM Date: 04/02/11  Intake/Output from previous day: 04-09-22 0701 - 03/12 0700 In: 5855.2 [I.V.:5855.2] Out: -  Intake/Output this shift:    General appearance: alert and no distress GI: Soft flat positive bowel sounds. No abdominal pain or distention.  Lab Results:   Basename 04/07/11 0536 04-09-2011 0614  WBC 5.1 6.9  HGB 10.0* 10.1*  HCT 32.0* 33.8*  PLT 315 300   BMET  Basename 04/07/11 0536 April 09, 2011 0614  NA 136 138  K 3.5 3.5  CL 103 101  CO2 21 23  GLUCOSE 74 79  BUN 7 7  CREATININE 0.54 0.56  CALCIUM 9.1 9.0   PT/INR  Basename 04/07/11 0536 Apr 09, 2011 0614  LABPROT 19.9* 21.1*  INR 1.66* 1.79*   ABG No results found for this basename: PHART:2,PCO2:2,PO2:2,HCO3:2 in the last 72 hours  Studies/Results: Dg Small Bowel  2011-04-09  *RADIOLOGY REPORT*  Clinical Data:  Persistent small bowel obstruction.  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  SMALL BOWEL SERIES  Technique:  Following ingestion of a mixture of thin barium and Entero Vu, serial small bowel images were obtained including spot views of the terminal ileum.  Fluoroscopy time:  1.4 minutes.  Comparison:  CTs, including 04/03/2011.  Findings:  Pre procedure scout film demonstrates moderate amount of retained contrast within the colon from recent CT.  No free intraperitoneal air.  Nasogastric tube terminating at the body of the stomach.  Distal gas identified.  Phleboliths in the left hemi pelvis.  Small bowel follow-through images demonstrate mild but improved small bowel dilatation proximally.  Transition point is again  identified at the mid small bowel. Relatively decompressed distal small bowel, measuring proximally 1.5 cm.  Normal small bowel transit time, approximately 1 hour and 25 minutes.  Spot images demonstrate no evidence of underlying obstructive mass. Normal terminal ileum.  IMPRESSION: Improved but persistent low grade partial small bowel obstruction.  Original Report Authenticated By: Consuello Bossier, M.D.    Anti-infectives: Anti-infectives    None      Assessment/Plan: s/p * No surgery found * Partial SBO. Findings from small bowel follow-through discussed with patient. Although there still is an area of narrowing it does appear that patient is having passage of GI contents pass this area. Clinically I will continue to advance the patient's diet as tolerated. If she does tolerate Foley would say dinner I will have her nasogastric tube discontinued. Hopefully she will tolerate continued management diet and hopeful discharge tomorrow pending symptomatology and clinical progress.  LOS: 4 days    Jamille Fisher C 04/07/2011

## 2011-04-07 NOTE — Progress Notes (Signed)
Patient 's NG-tube discontinued per MD's orders, may remove after lunch if tolerates meal,and no n/v.

## 2011-04-07 NOTE — Progress Notes (Signed)
ANTICOAGULATION CONSULT NOTE   Pharmacy Consult for Heparin Indication: DVT (while off Warfarin-surgery workup in progress)  Allergies  Allergen Reactions  . Cephalexin Swelling  . Dexlansoprazole Swelling  . Latex Itching  . Other Swelling    Patient states that Pecans cause her mouth to swell.  Marland Kitchen Penicillins Swelling  . Tape Itching  . Lovenox Itching, Swelling and Palpitations  . Nylon Rash  . Omeprazole Itching and Rash  . Sulfonamide Derivatives Rash   Patient Measurements: Height: 5' 7.5" (171.5 cm) Weight: 229 lb 12.8 oz (104.237 kg) IBW/kg (Calculated) : 62.75  Heparin Dosing Weight: 100 kg  Vital Signs: Temp: 97.5 F (36.4 C) (03/12 0827) BP: 137/77 mmHg (03/12 0827) Pulse Rate: 61  (03/12 0827)  Labs:  Basename 04/07/11 0536 04/06/11 0614 04/05/11 0623  HGB 10.0* 10.1* --  HCT 32.0* 33.8* 34.7*  PLT 315 300 342  APTT -- -- --  LABPROT 19.9* 21.1* 22.7*  INR 1.66* 1.79* 1.96*  HEPARINUNFRC 0.65 0.80* 0.46  CREATININE 0.54 0.56 0.56  CKTOTAL -- -- --  CKMB -- -- --  TROPONINI -- -- --   Estimated Creatinine Clearance: 94.9 ml/min (by C-G formula based on Cr of 0.54).  Medical History: Past Medical History  Diagnosis Date  . Vitamin B12 deficiency     vit b12 1000 mcg monthly  . Cellulitis of left leg 2006  . Ulcer 05/2009    esophageal  . Clotting disorder     heterozygosity from factor v leiden  . Pernicious anemia 07/10/2010  . DVT (deep venous thrombosis) 07/10/2010    on coumadin  . Factor V Leiden   . Anxiety   . 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 Allergic urticaria 01/04/2011    Rash from tape.  Marland Kitchen GERD (gastroesophageal reflux disease)   . Ventricular tachycardia 03/26/11   Medications:  Scheduled:     . antiseptic oral rinse  15 mL Mouth Rinse q12n4p  . chlorhexidine  15 mL Mouth Rinse BID  . metoprolol  5 mg Intravenous Q6H  . morphine  4 mg Intravenous Once  . pantoprazole (PROTONIX) IV  40 mg Intravenous QHS    Assessment: Full anticoagulation with IV Heparin while NPO. Was on Heparin recently at [facility]. Patient tolerated Heparin without problems. Heparin level at goal. No signs of bleeding.  Goal of Therapy:  Heparin level 0.3-0.7 units/ml   Plan:  Continue Heparin to 1500 units per hour. Daily Heparin Level & CBC per protocol.  Gilman Buttner, [place] J 04/07/2011,9:58 AM

## 2011-04-08 ENCOUNTER — Ambulatory Visit (HOSPITAL_COMMUNITY): Payer: Medicare Other

## 2011-04-08 LAB — CBC
MCV: 78.1 fL (ref 78.0–100.0)
Platelets: 293 10*3/uL (ref 150–400)
RDW: 17.8 % — ABNORMAL HIGH (ref 11.5–15.5)
WBC: 4.4 10*3/uL (ref 4.0–10.5)

## 2011-04-08 LAB — BASIC METABOLIC PANEL
Calcium: 9 mg/dL (ref 8.4–10.5)
GFR calc non Af Amer: >90 mL/min (ref >90)
Glucose, Bld: 87 mg/dL (ref 70–99)
Sodium: 139 mEq/L (ref 135–145)

## 2011-04-08 LAB — PROTIME-INR
INR: 1.49 (ref 0.00–1.49)
Prothrombin Time: 18.3 seconds — ABNORMAL HIGH (ref 11.6–15.2)

## 2011-04-08 LAB — HEPARIN LEVEL (UNFRACTIONATED): Heparin Unfractionated: 0.68 IU/mL (ref 0.30–0.70)

## 2011-04-08 MED ORDER — FONDAPARINUX SODIUM 10 MG/0.8ML ~~LOC~~ SOLN
10.0000 mg | Freq: Once | SUBCUTANEOUS | Status: AC
  Administered 2011-04-08: 10 mg via SUBCUTANEOUS
  Filled 2011-04-08: qty 0.8

## 2011-04-08 MED ORDER — WARFARIN - PHARMACIST DOSING INPATIENT: Freq: Every day | Status: DC

## 2011-04-08 MED ORDER — WARFARIN SODIUM 7.5 MG PO TABS
7.5000 mg | ORAL_TABLET | Freq: Every day | ORAL | Status: DC
  Administered 2011-04-08: 7.5 mg via ORAL
  Filled 2011-04-08: qty 1

## 2011-04-08 NOTE — Progress Notes (Signed)
ANTICOAGULATION CONSULT NOTE   Pharmacy Consult for Heparin Indication: DVT (while off Warfarin-surgery workup in progress)  Allergies  Allergen Reactions  . Cephalexin Swelling  . Dexlansoprazole Swelling  . Latex Itching  . Other Swelling    Patient states that Pecans cause her mouth to swell.  Marland Kitchen Penicillins Swelling  . Tape Itching  . Lovenox Itching, Swelling and Palpitations  . Nylon Rash  . Omeprazole Itching and Rash  . Sulfonamide Derivatives Rash   Patient Measurements: Height: 5' 7.5" (171.5 cm) Weight: 229 lb 12.8 oz (104.237 kg) IBW/kg (Calculated) : 62.75  Heparin Dosing Weight: 100 kg  Vital Signs: Temp: 98.1 F (36.7 C) (03/13 0604) BP: 134/66 mmHg (03/13 0604) Pulse Rate: 57  (03/13 0604)  Labs:  Basename 04/08/11 0545 04/07/11 0536 04/06/11 0614  HGB 9.8* 10.0* --  HCT 31.4* 32.0* 33.8*  PLT 293 315 300  APTT -- -- --  LABPROT 18.3* 19.9* 21.1*  INR 1.49 1.66* 1.79*  HEPARINUNFRC 0.68 0.65 0.80*  CREATININE 0.51 0.54 0.56  CKTOTAL -- -- --  CKMB -- -- --  TROPONINI -- -- --   Estimated Creatinine Clearance: 94.9 ml/min (by C-G formula based on Cr of 0.51).  Medical History: Past Medical History  Diagnosis Date  . Vitamin B12 deficiency     vit b12 1000 mcg monthly  . Cellulitis of left leg 2006  . Ulcer 05/2009    esophageal  . Clotting disorder     heterozygosity from factor v leiden  . Pernicious anemia 07/10/2010  . DVT (deep venous thrombosis) 07/10/2010    on coumadin  . Factor V Leiden   . Anxiety   . 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 Allergic urticaria 01/04/2011    Rash from tape.  Marland Kitchen GERD (gastroesophageal reflux disease)   . Ventricular tachycardia 03/26/11   Medications:  Scheduled:     . antiseptic oral rinse  15 mL Mouth Rinse q12n4p  . chlorhexidine  15 mL Mouth Rinse BID  . metoprolol  5 mg Intravenous Q6H  . morphine  4 mg Intravenous Once  . pantoprazole (PROTONIX) IV  40 mg Intravenous QHS  . sodium  chloride       Assessment: Full anticoagulation with IV Heparin while NPO. Was on Heparin recently at [facility]. Patient tolerated Heparin without problems. Heparin level at goal. No signs of bleeding.  Goal of Therapy:  Heparin level 0.3-0.7 units/ml   Plan:  Continue Heparin to 1500 units per hour. Daily Heparin Level & CBC per protocol.  Margo Aye, Crislyn Willbanks A 04/08/2011,7:50 AM

## 2011-04-08 NOTE — Discharge Summary (Signed)
Physician Discharge Summary  Patient ID: [name] MRN: [id number] DOB/AGE: May 19, 1950 61 y.o.  Admit date: 04/03/2011 Discharge date: 04/08/2011  Admission Diagnoses: Partial small bowel obstruction  Discharge Diagnoses: The same Active Problems:  * No active hospital problems. *    Discharged Condition: stable  Hospital Course: Patient was admitted with increasing abdominal pain nausea vomiting. Workup and evaluation was consistent for a partial small bowel obstruction. She's been treated conservatively with return function of bowel function. She has been advanced to a regular diet as tolerated this well. She has been resumed on her Coumadin today. Plans are set up for subcutaneous injections of anticoagulant therapy in the specialty clinic.  Consults: Pharmacy  Significant Diagnostic Studies: radiology: Small bowel follow-through  Treatments: IV hydration and anticoagulation: heparin  Discharge Exam: Blood pressure 122/72, pulse 66, temperature 97.7 F (36.5 C), temperature source Oral, resp. rate 20, height 5' 7.5" (1.715 m), weight 104.237 kg (229 lb 12.8 oz), SpO2 96.00%. General appearance: alert and no distress Eyes: Pupils equal round reactive extraocular movements are intact no conjunctival pallor is noted . Resp: clear to auscultation bilaterally Cardio: regular rate and rhythm GI: soft, non-tender; bowel sounds normal; no masses,  no organomegaly  Disposition: 01-Home or Self Care  Discharge Orders    Future Appointments: Provider: Department: Dept Phone: Center:   04/08/2011 4:30 PM [facility] [phone] None   04/16/2011 11:30 AM Marinus Maw, MD [facility] [phone] [facility]   09/14/2011 10:30 AM Malissa Hippo, MD [facility] [phone] None     Future Orders Please Complete By Expires   Diet - low sodium heart healthy      Increase activity slowly      Discharge instructions      Comments:   Followup  with specialty clinic for anticoagulation administration.  Maintain a low residual diet.   Call MD for:  temperature >100.4      Call MD for:  persistant nausea and vomiting      Call MD for:  severe uncontrolled pain      Call MD for:  redness, tenderness, or signs of infection (pain, swelling, redness, odor or green/yellow discharge around incision site)      Call MD for:  difficulty breathing, headache or visual disturbances        Medication List  As of 04/08/2011 10:57 AM   TAKE these medications         acetaminophen 325 MG tablet   Commonly known as: TYLENOL   Take 650 mg by mouth every 6 (six) hours as needed. Pain      ALPRAZolam 0.5 MG tablet   Commonly known as: XANAX   Take 0.5 mg by mouth daily.      CYANOCOBALAMIN IJ   Inject 1,000 mcg as directed every 30 (thirty) days.      famotidine 20 MG tablet   Commonly known as: PEPCID   Take 20 mg by mouth daily.      flecainide 100 MG tablet   Commonly known as: TAMBOCOR   Take 100 mg by mouth every 12 (twelve) hours. **Takes at 630 am and 630pm**      folic acid 1 MG tablet   Commonly known as: FOLVITE   Take 1 mg by mouth daily.      iron polysaccharides 150 MG capsule   Commonly known as: NIFEREX   Take 150 mg by mouth every morning.      lansoprazole 15 MG capsule  Commonly known as: PREVACID   Take 15 mg by mouth daily.      metoprolol 50 MG tablet   Commonly known as: LOPRESSOR   Take 1 tablet (50 mg total) by mouth 2 (two) times daily.      polyethylene glycol powder powder   Commonly known as: GLYCOLAX/MIRALAX   Take 17 g by mouth 2 (two) times daily as needed. constipation      warfarin 2 MG tablet   Commonly known as: COUMADIN   Take 7-8 mg by mouth daily. Patient states that she takes 8 mg every other day , 7 mg the other days. **Takes at 6pm daily**      warfarin 5 MG tablet   Commonly known as: COUMADIN   Take 7-8 mg by mouth daily. Patient states that she is taking 8 mg every other day, 7  mg the other days           Follow-up Information    Follow up with Denise Heap, MD .         SignedTilford Pillar C 04/08/2011, 10:57 AM

## 2011-04-08 NOTE — Progress Notes (Signed)
ANTICOAGULATION CONSULT NOTE   Pharmacy Consult for Heparin, Coumadin restarted 3/13 Indication: DVT   Allergies  Allergen Reactions  . Cephalexin Swelling  . Dexlansoprazole Swelling  . Latex Itching  . Other Swelling    Patient states that Pecans cause her mouth to swell.  Marland Kitchen Penicillins Swelling  . Tape Itching  . Lovenox Itching, Swelling and Palpitations  . Nylon Rash  . Omeprazole Itching and Rash  . Sulfonamide Derivatives Rash   Patient Measurements: Height: 5' 7.5" (171.5 cm) Weight: 229 lb 12.8 oz (104.237 kg) IBW/kg (Calculated) : 62.75  Heparin Dosing Weight: 100 kg  Vital Signs: Temp: 97.7 F (36.5 C) (03/13 0800) BP: 122/72 mmHg (03/13 0800) Pulse Rate: 66  (03/13 0800)  Labs:  Basename 04/08/11 0545 04/07/11 0536 04/06/11 0614  HGB 9.8* 10.0* --  HCT 31.4* 32.0* 33.8*  PLT 293 315 300  APTT -- -- --  LABPROT 18.3* 19.9* 21.1*  INR 1.49 1.66* 1.79*  HEPARINUNFRC 0.68 0.65 0.80*  CREATININE 0.51 0.54 0.56  CKTOTAL -- -- --  CKMB -- -- --  TROPONINI -- -- --   Estimated Creatinine Clearance: 94.9 ml/min (by C-G formula based on Cr of 0.51).  Medical History: Past Medical History  Diagnosis Date  . Vitamin B12 deficiency     vit b12 1000 mcg monthly  . Cellulitis of left leg 2006  . Ulcer 05/2009    esophageal  . Clotting disorder     heterozygosity from factor v leiden  . Pernicious anemia 07/10/2010  . DVT (deep venous thrombosis) 07/10/2010    on coumadin  . Factor V Leiden   . Anxiety   . 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 Allergic urticaria 01/04/2011    Rash from tape.  Marland Kitchen GERD (gastroesophageal reflux disease)   . Ventricular tachycardia 03/26/11   Medications:  Scheduled:     . antiseptic oral rinse  15 mL Mouth Rinse q12n4p  . chlorhexidine  15 mL Mouth Rinse BID  . metoprolol  5 mg Intravenous Q6H  . morphine  4 mg Intravenous Once  . pantoprazole (PROTONIX) IV  40 mg Intravenous QHS  . sodium chloride      .  warfarin  7.5 mg Oral q1800  . Warfarin - Pharmacist Dosing Inpatient   Does not apply q1800   Assessment: Full anticoagulation with IV Heparin while NPO. Was on Heparin recently at [facility]. Heparin level at goal. RESUME WARFARIN 7.5MG  DAILY PER MD  Goal of Therapy:  Heparin level 0.3-0.7 units/ml   Plan:  Continue Heparin to 1500 units per hour for now Warfarin 7.5mg  daily INR daily Daily Heparin Level & CBC per protocol.  Valrie Hart A 04/08/2011,10:08 AM

## 2011-04-09 ENCOUNTER — Encounter (HOSPITAL_BASED_OUTPATIENT_CLINIC_OR_DEPARTMENT_OTHER): Payer: Medicare Other

## 2011-04-09 ENCOUNTER — Other Ambulatory Visit (HOSPITAL_COMMUNITY): Payer: Self-pay | Admitting: Oncology

## 2011-04-09 DIAGNOSIS — Z86718 Personal history of other venous thrombosis and embolism: Secondary | ICD-10-CM

## 2011-04-09 DIAGNOSIS — D6851 Activated protein C resistance: Secondary | ICD-10-CM

## 2011-04-09 DIAGNOSIS — I82409 Acute embolism and thrombosis of unspecified deep veins of unspecified lower extremity: Secondary | ICD-10-CM

## 2011-04-09 MED ORDER — FONDAPARINUX SODIUM 10 MG/0.8ML ~~LOC~~ SOLN
10.0000 mg | Freq: Once | SUBCUTANEOUS | Status: AC
  Administered 2011-04-09: 10 mg via SUBCUTANEOUS
  Filled 2011-04-09: qty 0.8

## 2011-04-09 NOTE — Progress Notes (Signed)
[name] presents today for injection per MD orders. arixtra 10 mg administered SQ in right Abdomen. Administration without incident. Patient tolerated well.

## 2011-04-10 ENCOUNTER — Encounter (HOSPITAL_BASED_OUTPATIENT_CLINIC_OR_DEPARTMENT_OTHER): Payer: Medicare Other

## 2011-04-10 ENCOUNTER — Encounter (HOSPITAL_COMMUNITY): Payer: Medicare Other

## 2011-04-10 ENCOUNTER — Telehealth (HOSPITAL_COMMUNITY): Payer: Self-pay | Admitting: *Deleted

## 2011-04-10 ENCOUNTER — Other Ambulatory Visit (HOSPITAL_COMMUNITY): Payer: Self-pay | Admitting: Oncology

## 2011-04-10 VITALS — BP 103/69 | HR 60 | Temp 96.9°F | Wt 226.0 lb

## 2011-04-10 DIAGNOSIS — D6851 Activated protein C resistance: Secondary | ICD-10-CM

## 2011-04-10 DIAGNOSIS — I82409 Acute embolism and thrombosis of unspecified deep veins of unspecified lower extremity: Secondary | ICD-10-CM

## 2011-04-10 LAB — PROTIME-INR: Prothrombin Time: 19.3 seconds — ABNORMAL HIGH (ref 11.6–15.2)

## 2011-04-10 MED ORDER — FONDAPARINUX SODIUM 10 MG/0.8ML ~~LOC~~ SOLN
10.0000 mg | Freq: Once | SUBCUTANEOUS | Status: AC
  Administered 2011-04-10: 10 mg via SUBCUTANEOUS
  Filled 2011-04-10: qty 0.8

## 2011-04-10 NOTE — Telephone Encounter (Signed)
Spoke with pt. Instruct to continue same dose Coumadin 8 mg alternating 7 mg. Continue Arixtra injections daily. Pt/inr on 3/18. Verbalized understanding.

## 2011-04-10 NOTE — Telephone Encounter (Signed)
Message copied by Dennie Maizes on Fri Apr 10, 2011 12:06 PM ------      Message from: Ellouise Newer III      Created: Fri Apr 10, 2011 11:36 AM       Same dose Coumadin.  Continue Arixtra daily.            PT/INR on Monday

## 2011-04-10 NOTE — Progress Notes (Signed)
Specimen from left ac for pt/inr. Arixtra  Right lower abd. Tolerated well.

## 2011-04-11 ENCOUNTER — Encounter (HOSPITAL_BASED_OUTPATIENT_CLINIC_OR_DEPARTMENT_OTHER): Payer: Medicare Other

## 2011-04-11 DIAGNOSIS — I82409 Acute embolism and thrombosis of unspecified deep veins of unspecified lower extremity: Secondary | ICD-10-CM

## 2011-04-11 MED ORDER — FONDAPARINUX SODIUM 10 MG/0.8ML ~~LOC~~ SOLN
10.0000 mg | Freq: Once | SUBCUTANEOUS | Status: AC
  Administered 2011-04-11: 10 mg via SUBCUTANEOUS
  Filled 2011-04-11: qty 0.8

## 2011-04-11 NOTE — Progress Notes (Signed)
[name] presents today for injection per MD orders. Arixtra 10mg  administered SQ in left Abdomen. Administration without incident. Patient tolerated well.

## 2011-04-12 ENCOUNTER — Encounter (HOSPITAL_BASED_OUTPATIENT_CLINIC_OR_DEPARTMENT_OTHER): Payer: Medicare Other

## 2011-04-12 DIAGNOSIS — I82409 Acute embolism and thrombosis of unspecified deep veins of unspecified lower extremity: Secondary | ICD-10-CM

## 2011-04-12 MED ORDER — FONDAPARINUX SODIUM 10 MG/0.8ML ~~LOC~~ SOLN
10.0000 mg | Freq: Once | SUBCUTANEOUS | Status: AC
  Administered 2011-04-12: 10 mg via SUBCUTANEOUS
  Filled 2011-04-12: qty 0.8

## 2011-04-12 NOTE — Progress Notes (Signed)
[name] presents today for injection per MD orders. Arixtra 10mg  administered SQ in right Abdomen. Administration without incident. Patient tolerated well.

## 2011-04-13 ENCOUNTER — Emergency Department (HOSPITAL_COMMUNITY)
Admission: EM | Admit: 2011-04-13 | Discharge: 2011-04-13 | Disposition: A | Discharge status: Home / Self Care | Payer: Medicare Other | Attending: Emergency Medicine | Admitting: Emergency Medicine

## 2011-04-13 ENCOUNTER — Other Ambulatory Visit: Payer: Self-pay

## 2011-04-13 ENCOUNTER — Other Ambulatory Visit (HOSPITAL_COMMUNITY): Payer: Self-pay | Admitting: Oncology

## 2011-04-13 ENCOUNTER — Telehealth: Payer: Self-pay | Admitting: Physician Assistant

## 2011-04-13 ENCOUNTER — Encounter (HOSPITAL_COMMUNITY): Payer: Self-pay | Admitting: Emergency Medicine

## 2011-04-13 ENCOUNTER — Telehealth (HOSPITAL_COMMUNITY): Payer: Self-pay | Admitting: *Deleted

## 2011-04-13 ENCOUNTER — Encounter (HOSPITAL_BASED_OUTPATIENT_CLINIC_OR_DEPARTMENT_OTHER): Payer: Medicare Other

## 2011-04-13 ENCOUNTER — Encounter (HOSPITAL_COMMUNITY): Payer: Medicare Other

## 2011-04-13 DIAGNOSIS — D6859 Other primary thrombophilia: Secondary | ICD-10-CM | POA: Insufficient documentation

## 2011-04-13 DIAGNOSIS — D6851 Activated protein C resistance: Secondary | ICD-10-CM

## 2011-04-13 DIAGNOSIS — I472 Ventricular tachycardia, unspecified: Secondary | ICD-10-CM | POA: Insufficient documentation

## 2011-04-13 DIAGNOSIS — R001 Bradycardia, unspecified: Secondary | ICD-10-CM

## 2011-04-13 DIAGNOSIS — Z86718 Personal history of other venous thrombosis and embolism: Secondary | ICD-10-CM | POA: Insufficient documentation

## 2011-04-13 DIAGNOSIS — K219 Gastro-esophageal reflux disease without esophagitis: Secondary | ICD-10-CM | POA: Insufficient documentation

## 2011-04-13 DIAGNOSIS — I498 Other specified cardiac arrhythmias: Secondary | ICD-10-CM | POA: Insufficient documentation

## 2011-04-13 DIAGNOSIS — I4729 Other ventricular tachycardia: Secondary | ICD-10-CM | POA: Insufficient documentation

## 2011-04-13 DIAGNOSIS — I82409 Acute embolism and thrombosis of unspecified deep veins of unspecified lower extremity: Secondary | ICD-10-CM

## 2011-04-13 LAB — PROTIME-INR: Prothrombin Time: 24.9 seconds — ABNORMAL HIGH (ref 11.6–15.2)

## 2011-04-13 LAB — BASIC METABOLIC PANEL
BUN: 8 mg/dL (ref 6–23)
Chloride: 105 mEq/L (ref 96–112)
Glucose, Bld: 90 mg/dL (ref 70–99)
Potassium: 4.1 mEq/L (ref 3.5–5.1)

## 2011-04-13 LAB — CBC
HCT: 37.3 % (ref 36.0–46.0)
Hemoglobin: 11 g/dL — ABNORMAL LOW (ref 12.0–15.0)
MCV: 79.4 fL (ref 78.0–100.0)
WBC: 5.7 10*3/uL (ref 4.0–10.5)

## 2011-04-13 LAB — APTT: aPTT: 43 seconds — ABNORMAL HIGH (ref 24–37)

## 2011-04-13 MED ORDER — FONDAPARINUX SODIUM 10 MG/0.8ML ~~LOC~~ SOLN
10.0000 mg | Freq: Once | SUBCUTANEOUS | Status: AC
  Administered 2011-04-13: 10 mg via SUBCUTANEOUS
  Filled 2011-04-13: qty 0.8

## 2011-04-13 NOTE — ED Notes (Signed)
Pt undressed, in gown, on monitor, continuous pulse oximetry and blood pressure cuff; family at bedside; EKG performed in triage

## 2011-04-13 NOTE — Discharge Instructions (Signed)
Continue taking medications as prescribed by your doctor. Half your metoprolol night dose. Call [facility] first thing tomorrow morning to make a follow up appointment. Read instructions below on reasons to return to the emergency department.  Bradycardia Bradycardia is a term for a heart rate (pulse) that, in adults, is slower than 60 beats per minute. A normal rate is 60 to 100 beats per minute. A heart rate below 60 beats per minute may be normal for some adults with healthy hearts. If the rate is too slow, the heart may have trouble pumping the volume of blood the body needs. If the heart rate gets too low, blood flow to the brain may be decreased and may make you feel lightheaded, dizzy, or faint. The heart has a natural pacemaker in the top of the heart called the SA node (sinoatrial or sinus node). This pacemaker sends out regular electrical signals to the muscle of the heart, telling the heart muscle when to beat (contract). The electrical signal travels from the upper parts of the heart (atria) through the AV node (atrioventricular node), to the lower chambers of the heart (ventricles). The ventricles squeeze, pumping the blood from your heart to your lungs and to the rest of your body. CAUSES   Problem with the heart's electrical system.   Problem with the heart's natural pacemaker.   Heart disease, damage, or infection.   Medications.   Problems with minerals and salts (electrolytes).  SYMPTOMS   Fainting (syncope).   Fatigue and weakness.   Shortness of breath (dyspnea).   Chest pain (angina).   Drowsiness.   Confusion.  DIAGNOSIS   An electrocardiogram (ECG) can help your caregiver determine the type of slow heart rate you have.   If the cause is not seen on an ECG, you may need to wear a heart monitor that records your heart rhythm for several hours or days.   Blood tests.  TREATMENT   Electrolyte supplements.   Medications.   Withholding medication  which is causing a slow heart rate.   Pacemaker placement.  SEEK IMMEDIATE MEDICAL CARE IF:   You feel lightheaded or faint.   You develop an irregular heart rate.   You feel chest pain or have trouble breathing.  MAKE SURE YOU:   Understand these instructions.   Will watch your condition.   Will get help right away if you are not doing well or get worse.  Document Released: 10/04/2001 Document Revised: 01/01/2011 Document Reviewed: 08/31/2007 [facility] Patient Information 2012 [place], [place].

## 2011-04-13 NOTE — ED Provider Notes (Signed)
History     CSN: [id number]  Arrival date & time 04/13/11  1803   First MD Initiated Contact with Patient 04/13/11 1822      Chief Complaint  Patient presents with  . Bradycardia    (Consider location/radiation/quality/duration/timing/severity/associated sxs/prior treatment) HPI Comments: Patient with a history of DVT, factor V Leiden deficiency, GIST and ventricular tachycardia presents emergency department with chief complaint of bradycardia.  Patient was started on Metroprolol and flecainide 2 weeks ago for her ventricular tachycardia and is being followed by [facility].  Patient is currently taking 50 mg Metroprolol twice a day and 100 mg of flecainide every 12 hours.  Patient is currently asymptomatic and denies having any chest pain, shortness of breath, lightheadedness, dizziness, syncope, palpitation, leg swelling, claudication, PND, or orthopnea.  Patient states she has recently been therapeutic with her INR and she has no other complaints this time.   The history is provided by the patient.    Past Medical History  Diagnosis Date  . Vitamin B12 deficiency     vit b12 1000 mcg monthly  . Cellulitis of left leg 2006  . Ulcer 05/2009    esophageal  . Clotting disorder     heterozygosity from factor v leiden  . Pernicious anemia 07/10/2010  . DVT (deep venous thrombosis) 07/10/2010    on coumadin  . Factor V Leiden   . Anxiety   . 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 Allergic urticaria 01/04/2011    Rash from tape.  Marland Kitchen GERD (gastroesophageal reflux disease)   . Ventricular tachycardia 03/26/11    Past Surgical History  Procedure Date  . Abdominal hysterectomy 1989  . Balloon dilation 12/11/2010    Procedure: BALLOON DILATION;  Surgeon: Malissa Hippo, MD;  Location: [facility];  Service: Endoscopy;  Laterality: N/A;  . Laparotomy 01/05/2011    Procedure: EXPLORATORY LAPAROTOMY;  Surgeon: Dalia Heading;  Location: [facility];  Service: General;   Laterality: N/A;  . Bowel resection 01/05/2011    Procedure: SMALL BOWEL RESECTION;  Surgeon: Dalia Heading;  Location: [facility];  Service: General;;  Partial Small Bowel Resection  . Givens capsule study 01/02/2011    Procedure: GIVENS CAPSULE STUDY;  Surgeon: Malissa Hippo, MD;  Location: [facility];  Service: Endoscopy;  Laterality: N/A;    History reviewed. No pertinent family history.  History  Substance Use Topics  . Smoking status: Never Smoker   . Smokeless tobacco: Never Used  . Alcohol Use: No    OB History    Grav Para Term Preterm Abortions TAB SAB Ect Mult Living                  Review of Systems  Constitutional: Negative for fever, chills and appetite change.  HENT: Negative for congestion.   Eyes: Negative for visual disturbance.  Respiratory: Negative for shortness of breath.   Cardiovascular: Negative for chest pain and leg swelling.  Gastrointestinal: Negative for abdominal pain.  Genitourinary: Negative for dysuria, urgency and frequency.  Neurological: Negative for dizziness, syncope, weakness, light-headedness, numbness and headaches.  Psychiatric/Behavioral: Negative for confusion.  All other systems reviewed and are negative.    Allergies  Cephalexin; Dexlansoprazole; Latex; Other; Penicillins; Tape; Lovenox; Nylon; Omeprazole; and Sulfonamide derivatives  Home Medications   Current Outpatient Rx  Name Route Sig Dispense Refill  . ALPRAZOLAM 0.5 MG PO TABS Oral Take 0.5 mg by mouth at bedtime.     Marland Kitchen  CYANOCOBALAMIN IJ Injection Inject 1,000 mcg as directed every 30 (thirty) days.      Marland Kitchen FAMOTIDINE 20 MG PO TABS Oral Take 20 mg by mouth 2 (two) times daily.     Marland Kitchen FLECAINIDE ACETATE 100 MG PO TABS Oral Take 100 mg by mouth every 12 (twelve) hours. **Takes at 630 am and 630pm**    . FOLIC ACID 1 MG PO TABS Oral Take 1 mg by mouth daily.      Marland Kitchen POLYSACCHARIDE IRON COMPLEX 150 MG PO CAPS Oral Take 150 mg by mouth every morning.     Marland Kitchen LANSOPRAZOLE  15 MG PO CPDR Oral Take 15 mg by mouth at bedtime.     Marland Kitchen METOPROLOL TARTRATE 50 MG PO TABS Oral Take 1 tablet (50 mg total) by mouth 2 (two) times daily. 60 tablet 6  . WARFARIN SODIUM 2 MG PO TABS Oral Take 7-8 mg by mouth daily. Patient states that she takes 8 mg every other day , 7 mg the other days. **Takes at 6pm daily**    . WARFARIN SODIUM 5 MG PO TABS Oral Take 7-8 mg by mouth daily. Patient states that she is taking 8 mg every other day, 7 mg the other days    . ACETAMINOPHEN 325 MG PO TABS Oral Take 650 mg by mouth every 6 (six) hours as needed. Pain    . POLYETHYLENE GLYCOL 3350 PO POWD Oral Take 17 g by mouth 2 (two) times daily as needed. constipation      BP 146/81  Pulse 52  Temp(Src) 98 F (36.7 C) (Oral)  Resp 15  Wt 226 lb (102.513 kg)  SpO2 100%  Physical Exam  Nursing note and vitals reviewed. Constitutional: She is oriented to person, place, and time. She appears well-developed and well-nourished. No distress.       Bradycardic at 51  HENT:  Head: Normocephalic and atraumatic.  Mouth/Throat: Oropharynx is clear and moist. No oropharyngeal exudate.  Eyes: Conjunctivae and EOM are normal. Pupils are equal, round, and reactive to light. No scleral icterus.  Neck: Normal range of motion. Neck supple. No tracheal deviation present. No thyromegaly present.  Cardiovascular: Normal rate, regular rhythm, normal heart sounds and intact distal pulses.   Pulmonary/Chest: Effort normal and breath sounds normal. No stridor. No respiratory distress. She has no wheezes.  Abdominal: Soft.  Musculoskeletal: Normal range of motion. She exhibits no edema and no tenderness.  Neurological: She is alert and oriented to person, place, and time. Coordination normal.  Skin: Skin is warm and dry. No rash noted. She is not diaphoretic. No erythema. No pallor.  Psychiatric: She has a normal mood and affect. Her behavior is normal.    ED Course  Procedures (including critical care  time)  Labs Reviewed  PROTIME-INR - Abnormal; Notable for the following:    Prothrombin Time 25.1 (*)    INR 2.23 (*)    All other components within normal limits  APTT - Abnormal; Notable for the following:    aPTT 43 (*)    All other components within normal limits  CBC - Abnormal; Notable for the following:    Hemoglobin 11.0 (*)    MCH 23.4 (*)    MCHC 29.5 (*)    RDW 17.9 (*)    Platelets 418 (*)    All other components within normal limits  BASIC METABOLIC PANEL   No results found.   No diagnosis found.   Date: 04/13/2011  Rate: 54  Rhythm: sinus  bradycardia  QRS Axis: normal  Intervals: normal  ST/T Wave abnormalities: normal  Conduction Disutrbances:none  Narrative Interpretation:   Old EKG Reviewed: changes noted    MDM  Bradycardic  Pt has been advised to half her night dose of metropolol and f-u w [facility] this week. She is to return to the ED if she becomes symptomatic developing CP, SOB, DOE, or syncope. Pt verbalizes understanding and appears to be reliable for follow up. Case discussed with Dr. Adriana Simas who agrees with my plan to dc th the pt.       Jaci Carrel, PA-C 04/13/11 1957  Jaci Carrel, PA-C 04/13/11 2001

## 2011-04-13 NOTE — Progress Notes (Signed)
[name] presented for labwork. Labs per MD order drawn via Peripheral Line 23 gauge needle inserted in left antecubital.  Good blood return present. Procedure without incident.  Needle removed intact. Patient tolerated procedure well.  Current Coumadin dose 7 mg alternating with 8 mg. No missed doses reported.  [name] presents today for injection per MD orders. Arixtra 10 mg administered SQ in left Abdomen. Administration without incident. Patient tolerated well.

## 2011-04-13 NOTE — ED Provider Notes (Signed)
Medical screening examination/treatment/procedure(s) were conducted as a shared visit with non-physician practitioner(s) and myself.  I personally evaluated the patient during the encounter.  Bradycardia noted this morning serendipitously at vital sign check. Patient is asymptomatic for chest pain dyspnea. She is presently on metoprolol 50 mg twice a day. Will half evening dose 25 mg.  Patient will followup with her primary care Dr.  Donnetta Hutching, MD 04/13/11 2351

## 2011-04-13 NOTE — ED Notes (Signed)
Pt reports she was seen here approx 2 wks ago for tachycardia and placed on Metoprolol, pt went today to have her PT drawn, they informed pt her heart rate was too low, pt called Event organiser and they instructed pt to come to ED for further evaluation. Pt denies chest pain, sob, N/V, abd/back/shoulder/arm pain, dizziness, or diaphoresis.

## 2011-04-13 NOTE — Telephone Encounter (Signed)
**Note De-Identified [name] Obfuscation** Pt. States that she saw Dr. Dietrich Pates in office about 2 weeks ago and was hospitalized at that time. Note being routed to [facility], Dr. Marvel Plan nurse./LV

## 2011-04-13 NOTE — Telephone Encounter (Signed)
After speaking with Herma Carson, PA, who saw patient at last visit, her recommendations were for patient to go to the emergency room.  Advised patient that Marcelino Duster suggested ER treatment, preferably [name].  Patient states that she is feeling somewhat better at this time.  Explained to her that she should not continue on with a heart rate in the 40's due to current events.  Verbalizes understanding and states she will seek medical attention if she continues to feel bad and maintains a low hr.

## 2011-04-13 NOTE — Telephone Encounter (Signed)
PT FEELS LIGHTHEADED AND HER PULSE IS 46

## 2011-04-13 NOTE — Telephone Encounter (Signed)
Message copied by Dennie Maizes on Mon Apr 13, 2011  2:12 PM ------      Message from: Ellouise Newer III      Created: Mon Apr 13, 2011  1:07 PM       Stop Arixtra            Same Coumadin dose            PT/INR on Friday

## 2011-04-13 NOTE — ED Notes (Signed)
Pt to ED for eval of bradycardia, pt reports that she came to hospital 2 weeks ago with increased HR, and was placed on toproll; pt on coumadin and went for check up on PT/INR, office checked vital signs, and pt reports that her HR was slow 40-50's; pt c/o lightheadedness when sitting or standing; denies chest pain, n/v;

## 2011-04-13 NOTE — ED Notes (Signed)
Patient denies pain and is resting comfortably.  

## 2011-04-13 NOTE — Telephone Encounter (Signed)
Spoke with pt as below. Verbalized understanding. 

## 2011-04-13 NOTE — Progress Notes (Signed)
Lab draw

## 2011-04-14 ENCOUNTER — Telehealth (HOSPITAL_COMMUNITY): Payer: Self-pay | Admitting: Oncology

## 2011-04-15 NOTE — Progress Notes (Signed)
Patient ID: [name], female   DOB: November 24, 1950, 61 y.o.   MRN: [id number]   This is an update regarding [name] hospitalization and further documentation requested by the [facility].    Patient's Length of Stay was not effected by her history of Factor Five Leyden Deficiency.  While she required anticoagulation due to her history of this disorder, her length of stay was soley the result of her partial Small Bowel obstruction and the return of normal bowel function.  It was not until she was advanced to solid food that the decision to discharge the patient began.  This is standard for any patient presenting with either a partial or complete bowel obstruction.

## 2011-04-16 ENCOUNTER — Encounter (HOSPITAL_COMMUNITY): Payer: Self-pay | Admitting: Oncology

## 2011-04-16 ENCOUNTER — Ambulatory Visit (INDEPENDENT_AMBULATORY_CARE_PROVIDER_SITE_OTHER): Payer: Medicare Other | Admitting: Internal Medicine

## 2011-04-16 ENCOUNTER — Encounter (HOSPITAL_BASED_OUTPATIENT_CLINIC_OR_DEPARTMENT_OTHER): Payer: Medicare Other

## 2011-04-16 ENCOUNTER — Telehealth (HOSPITAL_COMMUNITY): Payer: Self-pay | Admitting: *Deleted

## 2011-04-16 ENCOUNTER — Other Ambulatory Visit (HOSPITAL_COMMUNITY): Payer: Self-pay | Admitting: Oncology

## 2011-04-16 ENCOUNTER — Encounter: Payer: Self-pay | Admitting: Internal Medicine

## 2011-04-16 VITALS — BP 104/62 | HR 51 | Resp 18 | Ht 67.0 in | Wt 222.0 lb

## 2011-04-16 DIAGNOSIS — I82409 Acute embolism and thrombosis of unspecified deep veins of unspecified lower extremity: Secondary | ICD-10-CM

## 2011-04-16 DIAGNOSIS — D6851 Activated protein C resistance: Secondary | ICD-10-CM

## 2011-04-16 DIAGNOSIS — R001 Bradycardia, unspecified: Secondary | ICD-10-CM

## 2011-04-16 DIAGNOSIS — I472 Ventricular tachycardia: Secondary | ICD-10-CM

## 2011-04-16 DIAGNOSIS — I498 Other specified cardiac arrhythmias: Secondary | ICD-10-CM

## 2011-04-16 MED ORDER — METOPROLOL TARTRATE 25 MG PO TABS: 25.0000 mg | ORAL_TABLET | Freq: Two times a day (BID) | ORAL | Status: DC

## 2011-04-16 MED ORDER — FLECAINIDE ACETATE 50 MG PO TABS: 75.0000 mg | ORAL_TABLET | Freq: Two times a day (BID) | ORAL | Status: DC

## 2011-04-16 NOTE — Patient Instructions (Addendum)
**Note De-Identified Bernese Doffing Obfuscation** Your physician recommends that you schedule a follow-up appointment in: 5 to 6 weeks  Your physician has recommended you make the following change in your medication: decrease Flecainide to 75 mg (1 and 1/2 tablets) twice daily and Metoprolol to 25 mg twice daily

## 2011-04-16 NOTE — Telephone Encounter (Signed)
Message copied by Adelene Amas on Thu Apr 16, 2011  2:17 PM ------      Message from: Ellouise Newer III      Created: Thu Apr 16, 2011 12:44 PM       Same dose Coumadin            PT/INR in 1 week

## 2011-04-16 NOTE — Progress Notes (Signed)
Lab draw today and coumadin instructions phoned to pt.

## 2011-04-16 NOTE — Telephone Encounter (Signed)
Message left to continue same dose coumadin and pt level in one week

## 2011-04-17 ENCOUNTER — Other Ambulatory Visit (HOSPITAL_COMMUNITY): Payer: Medicare Other

## 2011-04-19 ENCOUNTER — Encounter: Payer: Self-pay | Admitting: Internal Medicine

## 2011-04-19 DIAGNOSIS — R001 Bradycardia, unspecified: Secondary | ICD-10-CM | POA: Insufficient documentation

## 2011-04-19 NOTE — Assessment & Plan Note (Signed)
She appears to have symptomatic bradycardia. I have asked her to reduce her beta blocker dose to 25 mg twice daily and to reduce her flecainide to 75 mg twice daily.

## 2011-04-19 NOTE — Assessment & Plan Note (Signed)
Her VT has been well controlled on medical therapy. Hopefully the reduction in her beta blocker and flecainide will not result in any break through arrhythmias.

## 2011-04-19 NOTE — Progress Notes (Signed)
HPI [name] returns today for followup. She is a pleasant 61 yo woman with Paroxysmal VT, sinus bradycardia and HTN.She has he trouble with dizziness and bradycardia. She has reduced her dose of metoprolol fro 50 bid to 50 mg in the morning and 25 mg in the evening with minimal improvement. She notes that her heart rate is at times in the high 40's and low 50's. Her energy is reduced. No other complaints. She denies frank syncope. Allergies  Allergen Reactions  . Cephalexin Swelling  . Dexlansoprazole Swelling  . Latex Itching  . Other Swelling    Patient states that Pecans cause her mouth to swell.  Marland Kitchen Penicillins Swelling  . Tape Itching  . Lovenox Itching, Swelling and Palpitations  . Nylon Rash  . Omeprazole Itching and Rash  . Sulfonamide Derivatives Rash     Current Outpatient Prescriptions  Medication Sig Dispense Refill  . acetaminophen (TYLENOL) 325 MG tablet Take 650 mg by mouth every 6 (six) hours as needed. Pain      . ALPRAZolam (XANAX) 0.5 MG tablet Take 0.5 mg by mouth at bedtime.       . CYANOCOBALAMIN IJ Inject 1,000 mcg as directed every 30 (thirty) days.        . famotidine (PEPCID) 20 MG tablet Take 20 mg by mouth 2 (two) times daily.       . flecainide (TAMBOCOR) 50 MG tablet Take 1.5 tablets (75 mg total) by mouth every 12 (twelve) hours. **Takes at 630 am and 630pm**  45 tablet  3  . folic acid (FOLVITE) 1 MG tablet Take 1 mg by mouth daily.        . iron polysaccharides (NIFEREX) 150 MG capsule Take 150 mg by mouth every morning.       . lansoprazole (PREVACID) 15 MG capsule Take 15 mg by mouth at bedtime.       . metoprolol (LOPRESSOR) 25 MG tablet Take 1 tablet (25 mg total) by mouth 2 (two) times daily.  30 tablet  3  . polyethylene glycol powder (GLYCOLAX/MIRALAX) powder Take 17 g by mouth 2 (two) times daily as needed. constipation      . warfarin (COUMADIN) 2 MG tablet Take 7-8 mg by mouth daily. Patient states that she takes 8 mg every other day , 7 mg  the other days. **Takes at 6pm daily**      . warfarin (COUMADIN) 5 MG tablet Take 7-8 mg by mouth daily. Patient states that she is taking 8 mg every other day, 7 mg the other days         Past Medical History  Diagnosis Date  . Vitamin B12 deficiency     vit b12 1000 mcg monthly  . Cellulitis of left leg 2006  . Ulcer 05/2009    esophageal  . Clotting disorder     heterozygosity from factor v leiden  . Pernicious anemia 07/10/2010  . DVT (deep venous thrombosis) 07/10/2010    on coumadin  . Factor V Leiden   . Anxiety   . 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 Allergic urticaria 01/04/2011    Rash from tape.  Marland Kitchen GERD (gastroesophageal reflux disease)   . Ventricular tachycardia 03/26/11    ROS:   All systems reviewed and negative except as noted in the HPI.   Past Surgical History  Procedure Date  . Abdominal hysterectomy 1989  . Balloon dilation 12/11/2010    Procedure: BALLOON DILATION;  Surgeon: Ok Anis  Cline Crock, MD;  Location[facility];  Service: Endoscopy;  Laterality: N/A;  . Laparotomy 01/05/2011    Procedure: EXPLORATORY LAPAROTOMY;  Surgeon: Dalia Heading;  Location: [facility];  Service: General;  Laterality: N/A;  . Bowel resection 01/05/2011    Procedure: SMALL BOWEL RESECTION;  Surgeon: Dalia Heading;  Location: [facility];  Service: General;;  Partial Small Bowel Resection  . Givens capsule study 01/02/2011    Procedure: GIVENS CAPSULE STUDY;  Surgeon: Malissa Hippo, MD;  Location: [facility];  Service: Endoscopy;  Laterality: N/A;     No family history on file.   History   Social History  . Marital Status: Married    Spouse Name: N/A    Number of Children: N/A  . Years of Education: N/A   Occupational History  . Not on file.   Social History Main Topics  . Smoking status: Never Smoker   . Smokeless tobacco: Never Used  . Alcohol Use: No  . Drug Use: No  . Sexually Active: Not Currently   Other Topics Concern  . Not on file    Social History Narrative  . No narrative on file     BP 104/62  Pulse 51  Resp 18  Ht 5\' 7"  (1.702 m)  Wt 100.699 kg (222 lb)  BMI 34.77 kg/m2  Physical Exam:  Well appearing NAD HEENT: Unremarkable Neck:  No JVD, no thyromegally Lymphatics:  No adenopathy Back:  No CVA tenderness Lungs:  Clear with no wheezes, rales or rhonchi HEART:  Regular brady rhythm, no murmurs, no rubs, no clicks Abd:  soft, positive bowel sounds, no organomegally, no rebound, no guarding Ext:  2 plus pulses, no edema, no cyanosis, no clubbing Skin:  No rashes no nodules Neuro:  CN II through XII intact, motor grossly intact  Assess/Plan:

## 2011-04-23 ENCOUNTER — Other Ambulatory Visit (HOSPITAL_COMMUNITY): Payer: Self-pay | Admitting: Oncology

## 2011-04-23 ENCOUNTER — Encounter (HOSPITAL_COMMUNITY): Payer: Medicare Other

## 2011-04-23 ENCOUNTER — Telehealth (HOSPITAL_COMMUNITY): Payer: Self-pay | Admitting: *Deleted

## 2011-04-23 DIAGNOSIS — D6851 Activated protein C resistance: Secondary | ICD-10-CM

## 2011-04-23 LAB — PROTIME-INR
INR: 3.03 — ABNORMAL HIGH (ref 0.00–1.49)
Prothrombin Time: 31.9 seconds — ABNORMAL HIGH (ref 11.6–15.2)

## 2011-04-23 NOTE — Telephone Encounter (Signed)
Message left on answering machine for pt to continue same dose Coumadin and RTC 4/1 for pt/inr.

## 2011-04-23 NOTE — Telephone Encounter (Signed)
Message copied by Dennie Maizes on Thu Apr 23, 2011  1:19 PM ------      Message from: Ellouise Newer III      Created: Thu Apr 23, 2011 11:28 AM       Same dose            PT/INR on Monday

## 2011-04-27 ENCOUNTER — Telehealth: Payer: Self-pay | Admitting: Internal Medicine

## 2011-04-27 ENCOUNTER — Other Ambulatory Visit (HOSPITAL_COMMUNITY): Payer: Self-pay | Admitting: Oncology

## 2011-04-27 ENCOUNTER — Encounter (HOSPITAL_COMMUNITY): Payer: Medicare Other | Attending: Internal Medicine

## 2011-04-27 ENCOUNTER — Telehealth (HOSPITAL_COMMUNITY): Payer: Self-pay | Admitting: *Deleted

## 2011-04-27 DIAGNOSIS — D6859 Other primary thrombophilia: Secondary | ICD-10-CM | POA: Insufficient documentation

## 2011-04-27 DIAGNOSIS — D6851 Activated protein C resistance: Secondary | ICD-10-CM

## 2011-04-27 LAB — PROTIME-INR
INR: 3.31 — ABNORMAL HIGH (ref 0.00–1.49)
Prothrombin Time: 34.1 seconds — ABNORMAL HIGH (ref 11.6–15.2)

## 2011-04-27 MED ORDER — WARFARIN SODIUM 2 MG PO TABS: ORAL_TABLET | ORAL | Status: DC

## 2011-04-27 NOTE — Telephone Encounter (Signed)
Message copied by Dennie Maizes on Mon Apr 27, 2011  2:19 PM ------      Message from: Ellouise Newer III      Created: Mon Apr 27, 2011  1:53 PM       Already spoke with patient            Will change Coumadin to 7 mg daily            PT/INR on Monday.              Call her with appointment

## 2011-04-27 NOTE — Telephone Encounter (Signed)
Spoke with pt as below. Verbalized understanding. 

## 2011-04-27 NOTE — Telephone Encounter (Signed)
PT NEEDS A LETTER TO GIVE WORK ON HOW LONG SHE WILL BE OUT HER NEXT APPT WITH DR Ladona Ridgel IS 05/28/11 AT [facility] [address] OFFICE. PLEASE CALL HER WHEN IT IS READY TO BE PICKED UP

## 2011-04-29 NOTE — Telephone Encounter (Signed)
**Note De-Identified Syrita Dovel Obfuscation** Dr. Ladona Ridgel is on vacation this week. After review of pt's chart, pt. will be referred to her PCP as we did not take pt. out of work./LV

## 2011-04-30 ENCOUNTER — Telehealth: Payer: Self-pay

## 2011-04-30 NOTE — Telephone Encounter (Signed)
**Note De-Identified [name] Obfuscation** Pt. states that she has not gone back to work since being admtted to [facility] on 2-27 for rapid SVT and then transferred to [facility] the next day. She has been in hosp. since then for small bowel obstruction and also a visit to [facility] on 3-18 for bradycardia. She saw Dr. Ladona Ridgel on 3-21 and he made adjustments on her medications. Pt. admits that she never spoke to any of the [employer] Cardiologist about her working and has not gone back. She is due to f/u with Dr. Ladona Ridgel on 5-2. She states that her place of employment, [employer], is requiring her to bring them a note stating when she will return. Please advise./LV

## 2011-04-30 NOTE — Telephone Encounter (Signed)
**Note De-Identified [name] Obfuscation** Pt. Advised./LV

## 2011-05-04 ENCOUNTER — Encounter (HOSPITAL_BASED_OUTPATIENT_CLINIC_OR_DEPARTMENT_OTHER): Payer: Medicare Other

## 2011-05-04 ENCOUNTER — Other Ambulatory Visit (HOSPITAL_COMMUNITY): Payer: Self-pay | Admitting: Oncology

## 2011-05-04 DIAGNOSIS — D6851 Activated protein C resistance: Secondary | ICD-10-CM

## 2011-05-04 DIAGNOSIS — I82409 Acute embolism and thrombosis of unspecified deep veins of unspecified lower extremity: Secondary | ICD-10-CM

## 2011-05-04 LAB — PROTIME-INR: Prothrombin Time: 28 seconds — ABNORMAL HIGH (ref 11.6–15.2)

## 2011-05-04 NOTE — Progress Notes (Signed)
Labs drawn today for pt.  Patient on 7mg of coud.  Call patient at [phone]. 

## 2011-05-05 NOTE — Telephone Encounter (Signed)
**Note De-Identified Janeece Blok Obfuscation** Pt. advised that letter is ready to be picked up from front office, she verbalized understanding./LV

## 2011-05-05 NOTE — Telephone Encounter (Signed)
Dr Ladona Ridgel says patient okay to return to work

## 2011-05-05 NOTE — Telephone Encounter (Signed)
**Note De-identified [name] Obfuscation** LMOM./LV 

## 2011-05-11 ENCOUNTER — Telehealth (HOSPITAL_COMMUNITY): Payer: Self-pay | Admitting: *Deleted

## 2011-05-11 ENCOUNTER — Encounter (HOSPITAL_BASED_OUTPATIENT_CLINIC_OR_DEPARTMENT_OTHER): Payer: Medicare Other

## 2011-05-11 ENCOUNTER — Other Ambulatory Visit (HOSPITAL_COMMUNITY): Payer: Self-pay | Admitting: Oncology

## 2011-05-11 DIAGNOSIS — D6851 Activated protein C resistance: Secondary | ICD-10-CM

## 2011-05-11 DIAGNOSIS — D6859 Other primary thrombophilia: Secondary | ICD-10-CM

## 2011-05-11 NOTE — Progress Notes (Signed)
Lab draw today

## 2011-05-11 NOTE — Telephone Encounter (Signed)
Pt notified to take same dose coumadin and pt inr in 2 weeks. She states she received chemo med in mail---sutent 50 mg---She does not want to take this because she is afraid of what might happen after all the problems she has had in the past few months.

## 2011-05-15 NOTE — Progress Notes (Signed)
Labs drawn

## 2011-05-18 NOTE — Progress Notes (Signed)
Labs drawn

## 2011-05-25 ENCOUNTER — Other Ambulatory Visit (HOSPITAL_COMMUNITY): Payer: Medicare Other

## 2011-05-26 ENCOUNTER — Encounter (HOSPITAL_COMMUNITY): Payer: Medicare Other

## 2011-05-26 DIAGNOSIS — D6851 Activated protein C resistance: Secondary | ICD-10-CM

## 2011-05-28 ENCOUNTER — Ambulatory Visit: Payer: Medicare Other | Admitting: Internal Medicine

## 2011-06-09 ENCOUNTER — Other Ambulatory Visit (HOSPITAL_COMMUNITY): Payer: Medicare Other

## 2011-06-12 ENCOUNTER — Encounter (HOSPITAL_COMMUNITY): Payer: Medicare Other | Attending: Internal Medicine

## 2011-06-12 ENCOUNTER — Telehealth (HOSPITAL_COMMUNITY): Payer: Self-pay | Admitting: *Deleted

## 2011-06-12 ENCOUNTER — Other Ambulatory Visit (HOSPITAL_COMMUNITY): Payer: Self-pay | Admitting: Oncology

## 2011-06-12 DIAGNOSIS — D6851 Activated protein C resistance: Secondary | ICD-10-CM

## 2011-06-12 DIAGNOSIS — D6859 Other primary thrombophilia: Secondary | ICD-10-CM | POA: Insufficient documentation

## 2011-06-12 LAB — PROTIME-INR: INR: 2.66 — ABNORMAL HIGH (ref 0.00–1.49)

## 2011-06-12 MED ORDER — WARFARIN SODIUM 2 MG PO TABS: ORAL_TABLET | ORAL | Status: DC

## 2011-06-12 NOTE — Progress Notes (Signed)
Labs drawn today for pt.  Patient on 7mg of coud.  Call patient at [phone]. 

## 2011-06-23 ENCOUNTER — Ambulatory Visit (INDEPENDENT_AMBULATORY_CARE_PROVIDER_SITE_OTHER): Payer: Medicare Other | Admitting: Internal Medicine

## 2011-06-23 VITALS — BP 128/74 | HR 64 | Resp 16 | Ht 67.0 in | Wt 235.0 lb

## 2011-06-23 DIAGNOSIS — R001 Bradycardia, unspecified: Secondary | ICD-10-CM

## 2011-06-23 DIAGNOSIS — I498 Other specified cardiac arrhythmias: Secondary | ICD-10-CM

## 2011-06-23 DIAGNOSIS — I472 Ventricular tachycardia: Secondary | ICD-10-CM

## 2011-06-23 NOTE — Patient Instructions (Signed)
Your physician recommends that you schedule a follow-up appointment in: 1 year  

## 2011-06-26 ENCOUNTER — Encounter: Payer: Self-pay | Admitting: Internal Medicine

## 2011-06-26 NOTE — Assessment & Plan Note (Signed)
Her symptoms are well controlled. Will follow. Continue current meds.

## 2011-06-26 NOTE — Assessment & Plan Note (Signed)
She appears to be well controlled. Continue current meds. She is encouraged to lose weight.

## 2011-06-26 NOTE — Progress Notes (Signed)
HPI [name] returns today for followup. She is a pleasant 61 yo woman with a h/o HTN, bradycardia and VT. She has been fairly well controlled from the perspective of her VT with flecainide. Since I saw her last, she has had minimal palpitations. No syncope. Allergies  Allergen Reactions  . Cephalexin Swelling  . Dexlansoprazole Swelling  . Latex Itching  . Other Swelling    Patient states that Pecans cause her mouth to swell.  Marland Kitchen Penicillins Swelling  . Tape Itching  . Enoxaparin Sodium Itching, Swelling and Palpitations  . Nylon Rash  . Omeprazole Itching and Rash  . Sulfonamide Derivatives Rash     Current Outpatient Prescriptions  Medication Sig Dispense Refill  . acetaminophen (TYLENOL) 325 MG tablet Take 650 mg by mouth every 6 (six) hours as needed. Pain      . ALPRAZolam (XANAX) 0.5 MG tablet Take 0.5 mg by mouth at bedtime.       . CYANOCOBALAMIN IJ Inject 1,000 mcg as directed every 30 (thirty) days.        . famotidine (PEPCID) 20 MG tablet Take 20 mg by mouth 2 (two) times daily.       . flecainide (TAMBOCOR) 50 MG tablet Take 1.5 tablets (75 mg total) by mouth every 12 (twelve) hours. **Takes at 630 am and 630pm**  45 tablet  3  . folic acid (FOLVITE) 1 MG tablet Take 1 mg by mouth daily.        . iron polysaccharides (NIFEREX) 150 MG capsule Take 150 mg by mouth every morning.       . lansoprazole (PREVACID) 15 MG capsule Take 15 mg by mouth at bedtime.       . metoprolol (LOPRESSOR) 25 MG tablet Take 1 tablet (25 mg total) by mouth 2 (two) times daily.  30 tablet  3  . polyethylene glycol powder (GLYCOLAX/MIRALAX) powder Take 17 g by mouth 2 (two) times daily as needed. constipation      . warfarin (COUMADIN) 2 MG tablet Patient states that she takes 7 mg daily. **Takes at 6pm daily**  30 tablet  3  . DISCONTD: diphenhydrAMINE (BENADRYL) 25 mg capsule Take 25 mg by mouth every 6 (six) hours as needed. Itching or allergies      . DISCONTD: iron polysaccharides (NIFEREX)  150 MG capsule Take 150 mg by mouth daily.           Past Medical History  Diagnosis Date  . Vitamin B12 deficiency     vit b12 1000 mcg monthly  . Cellulitis of left leg 2006  . Ulcer 05/2009    esophageal  . Clotting disorder     heterozygosity from factor v leiden  . Pernicious anemia 07/10/2010  . DVT (deep venous thrombosis) 07/10/2010    on coumadin  . Factor V Leiden   . Anxiety   . 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 Allergic urticaria 01/04/2011    Rash from tape.  Marland Kitchen GERD (gastroesophageal reflux disease)   . Ventricular tachycardia 03/26/11    ROS:   All systems reviewed and negative except as noted in the HPI.   Past Surgical History  Procedure Date  . Abdominal hysterectomy 1989  . Balloon dilation 12/11/2010    Procedure: BALLOON DILATION;  Surgeon: Malissa Hippo, MD;  Location: [facility];  Service: Endoscopy;  Laterality: N/A;  . Laparotomy 01/05/2011    Procedure: EXPLORATORY LAPAROTOMY;  Surgeon: Dalia Heading;  Location:  [facility] ORS;  Service: General;  Laterality: N/A;  . Bowel resection 01/05/2011    Procedure: SMALL BOWEL RESECTION;  Surgeon: Dalia Heading;  Location: [facility] ORS;  Service: General;;  Partial Small Bowel Resection  . Givens capsule study 01/02/2011    Procedure: GIVENS CAPSULE STUDY;  Surgeon: Malissa Hippo, MD;  Location: [facility];  Service: Endoscopy;  Laterality: N/A;     No family history on file.   History   Social History  . Marital Status: Married    Spouse Name: N/A    Number of Children: N/A  . Years of Education: N/A   Occupational History  . Not on file.   Social History Main Topics  . Smoking status: Never Smoker   . Smokeless tobacco: Never Used  . Alcohol Use: No  . Drug Use: No  . Sexually Active: Not Currently   Other Topics Concern  . Not on file   Social History Narrative  . No narrative on file     BP 128/74  Pulse 64  Resp 16  Ht 5\' 7"  (1.702 m)  Wt 235 lb (106.595 kg)  BMI  36.81 kg/m2  Physical Exam:  Well appearing NAD HEENT: Unremarkable Neck:  No JVD, no thyromegally Lungs:  Clear with no wheezes. HEART:  Regular rate rhythm, no murmurs, no rubs, no clicks Abd:  soft, positive bowel sounds, no organomegally, no rebound, no guarding Ext:  2 plus pulses, no edema, no cyanosis, no clubbing Skin:  No rashes no nodules Neuro:  CN II through XII intact, motor grossly intact  Assess/Plan:

## 2011-06-30 ENCOUNTER — Encounter (HOSPITAL_COMMUNITY): Payer: Medicare Other | Attending: Internal Medicine

## 2011-06-30 ENCOUNTER — Other Ambulatory Visit (HOSPITAL_COMMUNITY): Payer: Self-pay | Admitting: Oncology

## 2011-06-30 DIAGNOSIS — D6851 Activated protein C resistance: Secondary | ICD-10-CM

## 2011-06-30 DIAGNOSIS — D6859 Other primary thrombophilia: Secondary | ICD-10-CM | POA: Insufficient documentation

## 2011-06-30 NOTE — Progress Notes (Signed)
Labs drawn today for pt.  Patent on 7mg  of coud.  Call patient at [phone]

## 2011-07-03 ENCOUNTER — Other Ambulatory Visit (HOSPITAL_COMMUNITY): Payer: Medicare Other

## 2011-07-21 ENCOUNTER — Other Ambulatory Visit (HOSPITAL_COMMUNITY): Payer: Self-pay | Admitting: Oncology

## 2011-07-21 ENCOUNTER — Encounter (HOSPITAL_BASED_OUTPATIENT_CLINIC_OR_DEPARTMENT_OTHER): Payer: Medicare Other

## 2011-07-21 DIAGNOSIS — D510 Vitamin B12 deficiency anemia due to intrinsic factor deficiency: Secondary | ICD-10-CM

## 2011-07-21 DIAGNOSIS — D6859 Other primary thrombophilia: Secondary | ICD-10-CM

## 2011-07-21 DIAGNOSIS — D6851 Activated protein C resistance: Secondary | ICD-10-CM

## 2011-07-21 MED ORDER — CYANOCOBALAMIN 1000 MCG/ML IJ SOLN: 1000.0000 ug | INTRAMUSCULAR | Status: AC

## 2011-07-21 NOTE — Progress Notes (Signed)
Pt notified to continue same dose coumadin and repeat INR in 4 weeks.

## 2011-07-27 ENCOUNTER — Other Ambulatory Visit: Payer: Self-pay | Admitting: Internal Medicine

## 2011-07-27 DIAGNOSIS — I472 Ventricular tachycardia: Secondary | ICD-10-CM

## 2011-07-27 MED ORDER — FLECAINIDE ACETATE 50 MG PO TABS: 75.0000 mg | ORAL_TABLET | Freq: Two times a day (BID) | ORAL | Status: DC

## 2011-08-03 ENCOUNTER — Encounter (HOSPITAL_COMMUNITY): Payer: Self-pay

## 2011-08-18 ENCOUNTER — Encounter (HOSPITAL_COMMUNITY): Payer: Medicare Other | Attending: Internal Medicine

## 2011-08-18 ENCOUNTER — Other Ambulatory Visit (HOSPITAL_COMMUNITY): Payer: Self-pay | Admitting: Oncology

## 2011-08-18 DIAGNOSIS — D6859 Other primary thrombophilia: Secondary | ICD-10-CM | POA: Insufficient documentation

## 2011-08-18 DIAGNOSIS — D6851 Activated protein C resistance: Secondary | ICD-10-CM

## 2011-08-18 DIAGNOSIS — I82409 Acute embolism and thrombosis of unspecified deep veins of unspecified lower extremity: Secondary | ICD-10-CM

## 2011-08-18 NOTE — Progress Notes (Signed)
Labs drawn today for pt.  Patient on 7mg of coud.  Call patient at [phone]. 

## 2011-08-26 ENCOUNTER — Telehealth: Payer: Self-pay | Admitting: Internal Medicine

## 2011-08-26 DIAGNOSIS — I472 Ventricular tachycardia: Secondary | ICD-10-CM

## 2011-08-26 MED ORDER — METOPROLOL TARTRATE 25 MG PO TABS: 25.0000 mg | ORAL_TABLET | Freq: Two times a day (BID) | ORAL | Status: DC

## 2011-08-26 NOTE — Telephone Encounter (Signed)
Pt needs refill on metoprolol. She states if she it to take 2 a day we need to up date rx for #60 not #30/tmj

## 2011-08-31 ENCOUNTER — Other Ambulatory Visit: Payer: Self-pay | Admitting: *Deleted

## 2011-08-31 DIAGNOSIS — I472 Ventricular tachycardia: Secondary | ICD-10-CM

## 2011-09-03 ENCOUNTER — Other Ambulatory Visit (HOSPITAL_COMMUNITY): Payer: Self-pay | Admitting: Oncology

## 2011-09-03 DIAGNOSIS — D509 Iron deficiency anemia, unspecified: Secondary | ICD-10-CM

## 2011-09-03 MED ORDER — POLYSACCHARIDE IRON COMPLEX 150 MG PO CAPS: 150.0000 mg | ORAL_CAPSULE | ORAL | Status: DC

## 2011-09-14 ENCOUNTER — Ambulatory Visit (INDEPENDENT_AMBULATORY_CARE_PROVIDER_SITE_OTHER): Payer: Medicare Other | Admitting: Internal Medicine

## 2011-09-14 ENCOUNTER — Other Ambulatory Visit (HOSPITAL_COMMUNITY): Payer: Self-pay | Admitting: Oncology

## 2011-09-14 ENCOUNTER — Encounter (INDEPENDENT_AMBULATORY_CARE_PROVIDER_SITE_OTHER): Payer: Self-pay | Admitting: Internal Medicine

## 2011-09-14 VITALS — BP 110/70 | HR 68 | Temp 98.2°F | Resp 20 | Ht 67.0 in | Wt 237.9 lb

## 2011-09-14 DIAGNOSIS — D6851 Activated protein C resistance: Secondary | ICD-10-CM

## 2011-09-14 DIAGNOSIS — D509 Iron deficiency anemia, unspecified: Secondary | ICD-10-CM

## 2011-09-14 DIAGNOSIS — K219 Gastro-esophageal reflux disease without esophagitis: Secondary | ICD-10-CM

## 2011-09-14 MED ORDER — WARFARIN SODIUM 2 MG PO TABS: ORAL_TABLET | ORAL | Status: DC

## 2011-09-14 NOTE — Progress Notes (Signed)
Presenting complaint;  Followup for chronic GERD and iron deficiency.  Subjective:  Patient is 61 year old Caucasian female who has history of ulcerative reflux esophagitis and iron deficiency anemia. 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 She could not tolerate Gleevec. She apparently developed ventricular tachycardia and cardioversion did not work. Her arrhythmia was controlled with flecainide. Patient states that this medication was stopped because of slow heart rate and low blood pressure one month ago. She complains of exertional dyspnea. He denies chest pain. She has very good appetite. She has gained 8 pounds in the last 6 months. She denies heartburn dysphagia abdominal pain melena or rectal bleeding. She says current therapy is controlling heartburn to her satisfaction. She does complain of feeling a knot in her upper abdomen when she eats fatty foods like hot dogs.  Current Medications: Current Outpatient Prescriptions  Medication Sig Dispense Refill  . acetaminophen (TYLENOL) 325 MG tablet Take 650 mg by mouth every 6 (six) hours as needed. Pain      . ALPRAZolam (XANAX) 0.5 MG tablet Take 0.5 mg by mouth at bedtime.       . cyanocobalamin (,VITAMIN B-12,) 1000 MCG/ML injection Inject 1 mL (1,000 mcg total) into the muscle every 30 (thirty) days.  12 mL  0  . famotidine (PEPCID) 20 MG tablet Take 20 mg by mouth 2 (two) times daily.       . folic acid (FOLVITE) 1 MG tablet Take 1 mg by mouth daily.        . iron polysaccharides (NIFEREX) 150 MG capsule Take 1 capsule (150 mg total) by mouth every morning.  30 capsule  4  . lansoprazole (PREVACID) 15 MG capsule Take 15 mg by mouth at bedtime.       . metoprolol tartrate (LOPRESSOR) 25 MG tablet Take 1 tablet (25 mg total) by mouth 2 (two) times daily.  60 tablet  12  . warfarin (COUMADIN) 2 MG tablet Patient states that she takes 7 mg daily. **Takes at 6pm daily**  30 tablet   3  . flecainide (TAMBOCOR) 50 MG tablet Take 1.5 tablets (75 mg total) by mouth every 12 (twelve) hours. **Takes at 630 am and 630pm**  90 tablet  3  . DISCONTD: diphenhydrAMINE (BENADRYL) 25 mg capsule Take 25 mg by mouth every 6 (six) hours as needed. Itching or allergies         Objective: Blood pressure 110/70, pulse 68, temperature 98.2 F (36.8 C), temperature source Oral, resp. rate 20, height 5\' 7"  (1.702 m), weight 237 lb 14.4 oz (107.911 kg). Patient is alert and in no acute distress. Conjunctiva is pink. Sclera is nonicteric Oropharyngeal mucosa is normal. No neck masses or thyromegaly noted. Cardiac exam with regular rhythm normal S1 and S2. No murmur or gallop noted. Lungs are clear to auscultation. Abdomen is full. Abdomen is soft and nontender without organomegaly or masses.  No clubbing or koilonychia noted. Scot and changes of stasis dermatitis involving distal left leg and prominent veins.  Labs/studies Results: H&H on 04/13/2011 was 11 and 37.3 and MCV 79.4. Iron studies from September 2012. Serum iron 17, TIBC 348 and saturation 5% Serum ferritin was 12.  Assessment:  #1. Chronic GERD. She has history of ulcerative esophagitis and hiatal hernia. She has been intolerant to usual doses of PPI and appears to be doing well with combination of H2B and  low dose PPI. She should continue with  this combination as long as it is working. #2. History of iron deficiency anemia. Hemoglobin in March 2013 was 11 g. 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 #3. Exertional dyspnea. She does not appear to be anemic. She needs to follow with Dr. Dietrich Pates make sure exertional dyspnea is not due to cardiac disease.    Plan:  Continue anti-reflux measures. Continue Pepcid 20 mg before breakfast and at bedtime. Continue lansoprazole 15 mg by mouth before evening meal. Hemoccult x1. CBC, serum iron, TIBC, saturation and ferritin level. Patient  also advised to make an appointment to see Dr. Dietrich Pates for reevaluation of antiarrhythmic therapy and exertional dyspnea. Office visit in one year.

## 2011-09-14 NOTE — Patient Instructions (Signed)
Hemoccult x1. Physician will contact you with results of blood work. Please make appointment with Dr. Dietrich Pates to discuss antiarrhythmic therapy and exertional dyspnea.

## 2011-09-15 ENCOUNTER — Encounter (HOSPITAL_COMMUNITY): Payer: Medicare Other | Attending: Internal Medicine

## 2011-09-15 ENCOUNTER — Other Ambulatory Visit (HOSPITAL_COMMUNITY): Payer: Self-pay | Admitting: Oncology

## 2011-09-15 DIAGNOSIS — I82409 Acute embolism and thrombosis of unspecified deep veins of unspecified lower extremity: Secondary | ICD-10-CM | POA: Insufficient documentation

## 2011-09-15 DIAGNOSIS — D6851 Activated protein C resistance: Secondary | ICD-10-CM

## 2011-09-15 LAB — IRON AND TIBC
%SAT: 28 % (ref 20–55)
Iron: 93 ug/dL (ref 42–145)

## 2011-09-15 LAB — CBC
MCH: 27.3 pg (ref 26.0–34.0)
MCHC: 33.2 g/dL (ref 30.0–36.0)
MCV: 82.2 fL (ref 78.0–100.0)
Platelets: 276 10*3/uL (ref 150–400)
RDW: 16.4 % — ABNORMAL HIGH (ref 11.5–15.5)

## 2011-09-15 LAB — PROTIME-INR: INR: 2.66 — ABNORMAL HIGH (ref 0.00–1.49)

## 2011-09-15 NOTE — Progress Notes (Signed)
Labs drawn today for pt 

## 2011-09-23 ENCOUNTER — Telehealth (HOSPITAL_COMMUNITY): Payer: Self-pay

## 2011-09-23 NOTE — Telephone Encounter (Signed)
Per Dr. Thornton Papas instructions change in Niferex dosing as requested by Dr. Karilyn Cota called into pharmacy.  Per Jamesetta So, Dr. Karilyn Cota requested that she take 1 twice daily and precription adjusted to cover the amount.

## 2011-09-23 NOTE — Telephone Encounter (Signed)
Per Jamesetta So, she saw Dr. Karilyn Cota last week and he wants her to take 1 iron pill twice daily, but he did not write a new prescription.

## 2011-10-02 ENCOUNTER — Encounter (INDEPENDENT_AMBULATORY_CARE_PROVIDER_SITE_OTHER): Payer: Self-pay

## 2011-10-13 ENCOUNTER — Other Ambulatory Visit (HOSPITAL_COMMUNITY): Payer: Self-pay | Admitting: Oncology

## 2011-10-13 ENCOUNTER — Encounter (HOSPITAL_COMMUNITY): Payer: Medicare Other | Attending: Internal Medicine

## 2011-10-13 DIAGNOSIS — D6851 Activated protein C resistance: Secondary | ICD-10-CM

## 2011-10-13 DIAGNOSIS — D6859 Other primary thrombophilia: Secondary | ICD-10-CM

## 2011-10-13 LAB — PROTIME-INR
INR: 2.51 — ABNORMAL HIGH (ref 0.00–1.49)
Prothrombin Time: 25.9 seconds — ABNORMAL HIGH (ref 11.6–15.2)

## 2011-10-13 NOTE — Progress Notes (Signed)
Labs drawn today for pt 

## 2011-10-14 ENCOUNTER — Other Ambulatory Visit (HOSPITAL_COMMUNITY): Payer: Self-pay | Admitting: Oncology

## 2011-10-14 DIAGNOSIS — D6851 Activated protein C resistance: Secondary | ICD-10-CM

## 2011-10-14 MED ORDER — WARFARIN SODIUM 2 MG PO TABS: ORAL_TABLET | ORAL | Status: DC

## 2011-10-14 MED ORDER — WARFARIN SODIUM 5 MG PO TABS: ORAL_TABLET | ORAL | Status: DC

## 2011-10-27 ENCOUNTER — Other Ambulatory Visit: Payer: Self-pay | Admitting: Family Medicine

## 2011-10-27 DIAGNOSIS — R52 Pain, unspecified: Secondary | ICD-10-CM

## 2011-11-04 ENCOUNTER — Ambulatory Visit (HOSPITAL_COMMUNITY)
Admission: RE | Admit: 2011-11-04 | Discharge: 2011-11-04 | Disposition: A | Discharge status: Home / Self Care | Payer: Medicare Other | Source: Ambulatory Visit | Attending: Family Medicine | Admitting: Family Medicine

## 2011-11-04 ENCOUNTER — Other Ambulatory Visit (HOSPITAL_COMMUNITY): Payer: Self-pay | Admitting: Family Medicine

## 2011-11-04 DIAGNOSIS — R52 Pain, unspecified: Secondary | ICD-10-CM

## 2011-11-04 DIAGNOSIS — N644 Mastodynia: Secondary | ICD-10-CM | POA: Insufficient documentation

## 2011-11-10 ENCOUNTER — Encounter (HOSPITAL_COMMUNITY): Payer: Medicare Other | Attending: Internal Medicine

## 2011-11-10 ENCOUNTER — Other Ambulatory Visit (HOSPITAL_COMMUNITY): Payer: Self-pay | Admitting: Oncology

## 2011-11-10 DIAGNOSIS — D6859 Other primary thrombophilia: Secondary | ICD-10-CM | POA: Insufficient documentation

## 2011-11-10 DIAGNOSIS — C494 Malignant neoplasm of connective and soft tissue of abdomen: Secondary | ICD-10-CM | POA: Insufficient documentation

## 2011-11-10 DIAGNOSIS — I82409 Acute embolism and thrombosis of unspecified deep veins of unspecified lower extremity: Secondary | ICD-10-CM

## 2011-11-10 DIAGNOSIS — D509 Iron deficiency anemia, unspecified: Secondary | ICD-10-CM | POA: Insufficient documentation

## 2011-11-10 DIAGNOSIS — D6851 Activated protein C resistance: Secondary | ICD-10-CM

## 2011-11-10 LAB — PROTIME-INR
INR: 2.92 — ABNORMAL HIGH (ref 0.00–1.49)
Prothrombin Time: 29 seconds — ABNORMAL HIGH (ref 11.6–15.2)

## 2011-11-10 NOTE — Progress Notes (Signed)
Labs drawn today for pt 

## 2011-11-12 ENCOUNTER — Other Ambulatory Visit (HOSPITAL_COMMUNITY): Payer: Medicare Other

## 2011-11-23 ENCOUNTER — Encounter (HOSPITAL_COMMUNITY): Payer: Self-pay | Admitting: Oncology

## 2011-11-23 ENCOUNTER — Other Ambulatory Visit (HOSPITAL_COMMUNITY): Payer: Self-pay | Admitting: Oncology

## 2011-11-23 ENCOUNTER — Encounter (HOSPITAL_BASED_OUTPATIENT_CLINIC_OR_DEPARTMENT_OTHER): Payer: Medicare Other | Admitting: Oncology

## 2011-11-23 VITALS — BP 187/88 | HR 55 | Temp 97.2°F | Resp 20 | Wt 244.0 lb

## 2011-11-23 DIAGNOSIS — D6851 Activated protein C resistance: Secondary | ICD-10-CM

## 2011-11-23 DIAGNOSIS — Z86718 Personal history of other venous thrombosis and embolism: Secondary | ICD-10-CM

## 2011-11-23 DIAGNOSIS — I82409 Acute embolism and thrombosis of unspecified deep veins of unspecified lower extremity: Secondary | ICD-10-CM

## 2011-11-23 DIAGNOSIS — C49A0 Gastrointestinal stromal tumor, unspecified site: Secondary | ICD-10-CM

## 2011-11-23 DIAGNOSIS — C179 Malignant neoplasm of small intestine, unspecified: Secondary | ICD-10-CM

## 2011-11-23 DIAGNOSIS — D6859 Other primary thrombophilia: Secondary | ICD-10-CM

## 2011-11-23 DIAGNOSIS — D509 Iron deficiency anemia, unspecified: Secondary | ICD-10-CM

## 2011-11-23 LAB — PROTIME-INR: Prothrombin Time: 27.8 seconds — ABNORMAL HIGH (ref 11.6–15.2)

## 2011-11-23 NOTE — Progress Notes (Signed)
Problem #1 chronic DVTs with superficial phlebitis on lifelong Coumadin. Factor V Leiden heterozygosity. Problem #2 GI ST stage IIB though she was not able to tolerate Gleevec due to toxicity and did not want to try anything else. Problem #3 chronic anxiety and depression though her depression is much improved. She is doing better. She looks the best I have seen her in many many months. Her INR is very therapeutic.  She knows that we need to do a followup CAT scan this year. Her surgery was in December of last year. We will try need to do one of the next 3-4 years in my opinion.  Her left leg is still scarred significantly but looks as good as it seems if she elevates her legs religiously now which is not walking. She is going to remain active however in walking. I don't think she is a good candidate for returning to work. She has less swelling now that I have seen him some time. The right leg is negative.  She denies any shortness of breath or chest pain.  Vital signs are all stable. We will see her right after the CAT scan.

## 2011-11-23 NOTE — Patient Instructions (Addendum)
[facility]  Discharge Instructions  RECOMMENDATIONS MADE BY THE CONSULTANT AND ANY TEST RESULTS WILL BE SENT TO YOUR REFERRING DOCTOR.   EXAM FINDINGS BY MD TODAY AND SIGNS AND SYMPTOMS TO REPORT TO CLINIC OR PRIMARY MD: Exam and discussion by MD.  Bonita Quin look great.  Will check your PT/INR today and will check other labs in 4 weeks to see how your iron level is.  MEDICATIONS PRESCRIBED: none   INSTRUCTIONS GIVEN AND DISCUSSED: Other :  Report unusual bruising or bleeding, etc.  SPECIAL INSTRUCTIONS/FOLLOW-UP: Lab work Needed today and every 4 weeks, Xray Studies Needed CT of Abdomen and Pelvis in December and Return to Clinic in 4 weeks after labs to see MD.   I acknowledge that I have been informed and understand all the instructions given to me and received a copy. I do not have any more questions at this time, but understand that I may call the Specialty Clinic at [facility] at [phone] during business hours should I have any further questions or need assistance in obtaining follow-up care.    __________________________________________  _____________  __________ Signature of Patient or Authorized Representative            Date                   Time    __________________________________________ Nurse's Signature

## 2011-12-14 ENCOUNTER — Other Ambulatory Visit (HOSPITAL_COMMUNITY): Payer: Medicare Other

## 2011-12-21 ENCOUNTER — Encounter (HOSPITAL_COMMUNITY): Payer: Medicare Other | Attending: Internal Medicine

## 2011-12-21 DIAGNOSIS — I82409 Acute embolism and thrombosis of unspecified deep veins of unspecified lower extremity: Secondary | ICD-10-CM | POA: Insufficient documentation

## 2011-12-21 DIAGNOSIS — D509 Iron deficiency anemia, unspecified: Secondary | ICD-10-CM

## 2011-12-21 DIAGNOSIS — D6851 Activated protein C resistance: Secondary | ICD-10-CM

## 2011-12-21 DIAGNOSIS — D6859 Other primary thrombophilia: Secondary | ICD-10-CM

## 2011-12-21 LAB — CBC WITH DIFFERENTIAL/PLATELET
Eosinophils Absolute: 0.1 10*3/uL (ref 0.0–0.7)
Hemoglobin: 14.2 g/dL (ref 12.0–15.0)
Lymphocytes Relative: 39 % (ref 12–46)
Lymphs Abs: 2.2 10*3/uL (ref 0.7–4.0)
MCH: 28.9 pg (ref 26.0–34.0)
MCV: 88.8 fL (ref 78.0–100.0)
Monocytes Relative: 11 % (ref 3–12)
Neutrophils Relative %: 48 % (ref 43–77)
RBC: 4.91 MIL/uL (ref 3.87–5.11)
WBC: 5.7 10*3/uL (ref 4.0–10.5)

## 2011-12-21 LAB — IRON AND TIBC
TIBC: 318 ug/dL (ref 250–470)
UIBC: 225 ug/dL (ref 125–400)

## 2011-12-21 LAB — FERRITIN: Ferritin: 41 ng/mL (ref 10–291)

## 2011-12-21 NOTE — Addendum Note (Signed)
Addended by: Evelena Leyden on: 12/21/2011 05:30 PM   Modules accepted: Orders

## 2011-12-21 NOTE — Progress Notes (Signed)
Labs drawn today for pt,cbc/diff,Iron and IBC,ferr

## 2011-12-22 ENCOUNTER — Ambulatory Visit (HOSPITAL_COMMUNITY): Payer: Medicare Other | Admitting: Oncology

## 2011-12-23 ENCOUNTER — Other Ambulatory Visit (HOSPITAL_COMMUNITY): Payer: Self-pay | Admitting: Oncology

## 2011-12-23 ENCOUNTER — Telehealth (HOSPITAL_COMMUNITY): Payer: Self-pay | Admitting: Oncology

## 2011-12-23 DIAGNOSIS — D6851 Activated protein C resistance: Secondary | ICD-10-CM

## 2011-12-23 MED ORDER — WARFARIN SODIUM 5 MG PO TABS: ORAL_TABLET | ORAL | Status: DC

## 2012-01-12 ENCOUNTER — Other Ambulatory Visit (HOSPITAL_COMMUNITY): Payer: Self-pay | Admitting: Oncology

## 2012-01-12 DIAGNOSIS — D6851 Activated protein C resistance: Secondary | ICD-10-CM

## 2012-01-12 MED ORDER — WARFARIN SODIUM 2 MG PO TABS: ORAL_TABLET | ORAL | Status: DC

## 2012-01-12 MED ORDER — FOLIC ACID 1 MG PO TABS: 1.0000 mg | ORAL_TABLET | Freq: Every day | ORAL | Status: DC

## 2012-01-22 ENCOUNTER — Other Ambulatory Visit (HOSPITAL_COMMUNITY): Payer: Self-pay | Admitting: Oncology

## 2012-01-22 ENCOUNTER — Encounter (HOSPITAL_COMMUNITY): Payer: Medicare Other | Attending: Internal Medicine

## 2012-01-22 DIAGNOSIS — D509 Iron deficiency anemia, unspecified: Secondary | ICD-10-CM | POA: Insufficient documentation

## 2012-01-22 DIAGNOSIS — D6851 Activated protein C resistance: Secondary | ICD-10-CM

## 2012-01-22 DIAGNOSIS — D6859 Other primary thrombophilia: Secondary | ICD-10-CM

## 2012-01-22 DIAGNOSIS — I82409 Acute embolism and thrombosis of unspecified deep veins of unspecified lower extremity: Secondary | ICD-10-CM | POA: Insufficient documentation

## 2012-01-22 LAB — COMPREHENSIVE METABOLIC PANEL
ALT: 46 U/L — ABNORMAL HIGH (ref 0–35)
AST: 38 U/L — ABNORMAL HIGH (ref 0–37)
Albumin: 3.6 g/dL (ref 3.5–5.2)
Alkaline Phosphatase: 96 U/L (ref 39–117)
BUN: 10 mg/dL (ref 6–23)
CO2: 26 mEq/L (ref 19–32)
Calcium: 9.5 mg/dL (ref 8.4–10.5)
Chloride: 105 mEq/L (ref 96–112)
Creatinine, Ser: 0.67 mg/dL (ref 0.50–1.10)
GFR calc Af Amer: >90 mL/min (ref >90)
GFR calc non Af Amer: >90 mL/min (ref >90)
Glucose, Bld: 94 mg/dL (ref 70–99)
Potassium: 4.3 mEq/L (ref 3.5–5.1)
Sodium: 139 mEq/L (ref 135–145)
Total Bilirubin: 0.4 mg/dL (ref 0.3–1.2)
Total Protein: 7.7 g/dL (ref 6.0–8.3)

## 2012-01-22 LAB — PROTIME-INR
INR: 2.39 — ABNORMAL HIGH (ref 0.00–1.49)
Prothrombin Time: 25 seconds — ABNORMAL HIGH (ref 11.6–15.2)

## 2012-01-22 NOTE — Progress Notes (Signed)
Labs drawn today for cmp,pt

## 2012-01-25 ENCOUNTER — Ambulatory Visit (HOSPITAL_COMMUNITY)
Admission: RE | Admit: 2012-01-25 | Discharge: 2012-01-25 | Disposition: A | Discharge status: Home / Self Care | Payer: Medicare Other | Source: Ambulatory Visit | Attending: Oncology | Admitting: Oncology

## 2012-01-25 DIAGNOSIS — C494 Malignant neoplasm of connective and soft tissue of abdomen: Secondary | ICD-10-CM | POA: Insufficient documentation

## 2012-01-25 DIAGNOSIS — C49A0 Gastrointestinal stromal tumor, unspecified site: Secondary | ICD-10-CM

## 2012-01-25 MED ORDER — IOHEXOL 300 MG/ML  SOLN
100.0000 mL | Freq: Once | INTRAMUSCULAR | Status: AC | PRN
  Administered 2012-01-25: 100 mL via INTRAVENOUS

## 2012-01-26 ENCOUNTER — Encounter (HOSPITAL_BASED_OUTPATIENT_CLINIC_OR_DEPARTMENT_OTHER): Payer: Medicare Other | Admitting: Oncology

## 2012-01-26 ENCOUNTER — Ambulatory Visit (HOSPITAL_COMMUNITY): Payer: Medicare Other | Admitting: Oncology

## 2012-01-26 VITALS — BP 142/84 | HR 55 | Temp 97.3°F | Resp 20 | Wt 251.4 lb

## 2012-01-26 DIAGNOSIS — I82409 Acute embolism and thrombosis of unspecified deep veins of unspecified lower extremity: Secondary | ICD-10-CM

## 2012-01-26 DIAGNOSIS — D6859 Other primary thrombophilia: Secondary | ICD-10-CM

## 2012-01-26 DIAGNOSIS — D509 Iron deficiency anemia, unspecified: Secondary | ICD-10-CM

## 2012-01-26 DIAGNOSIS — R948 Abnormal results of function studies of other organs and systems: Secondary | ICD-10-CM

## 2012-01-26 NOTE — Progress Notes (Signed)
Abdominopelvic CT films reviewed with Dr. Charlett Nose and compared with prior studies. Focal colonic wall thickening at cecum across and ileocecal valve. I also reviewed colonoscopy pictures from last year. Patient will need to undergo colonoscopy to make sure she does not have polyp or a small tumor. Discussed with Dr. Mariel Sleet. Patient called and made available plans. Ann,please schedule patient for colonoscopy. She will need to be bridged with lovenox.

## 2012-01-26 NOTE — Progress Notes (Signed)
Problem #1 factor V Leiden heterozygosity with multiple chronic DVTs and superficial phlebitis of the left leg on lifelong Coumadin Problem #2 GI ST, stage IIB, unable to tolerate Gleevec due to toxicity and she did not want to try any other drug. CT scan yesterday shows no evidence for recurrent disease. Problem #3 iron deficiency anemia and we will stop her iron pill presently since her ferritin in November and iron studies were within normal range and her hemoglobin was within normal range. She states the iron pills pills are constipating and would like to stop that. We will see what her iron stores in the blood counts are in 6 months. Problem #4 abnormal CT scan done yesterday with thickening of the cecal wall and I placed a phone call to Dr. Karilyn Cota who will call me later. She had a colonoscopy in December 2012 prior to the GI ST surgery. She feels well but has increased weight to 251 pounds now and is becoming quite overweight. She is not aware of changes in her bowel habits. She is constipated since being on the iron. She wants to stop that and I think that's fine. Her left leg still shows a chronic phlebitic changes and varicose veins of the left calf. The right leg is negative. She is in no acute distress. Vital signs are stable. I discussed with her in detail the CT scan findings which I need to discuss with Dr. Karilyn Cota. We will see her one way or the other in 6 months

## 2012-01-26 NOTE — Patient Instructions (Addendum)
[facility]  Discharge Instructions  RECOMMENDATIONS MADE BY THE CONSULTANT AND ANY TEST RESULTS WILL BE SENT TO YOUR REFERRING DOCTOR.   EXAM FINDINGS BY MD TODAY AND SIGNS AND SYMPTOMS TO REPORT TO CLINIC OR PRIMARY MD:   Stop Iron pill  Return in 6 months for labs  Then to see Dr. Mariel Sleet after labs   I acknowledge that I have been informed and understand all the instructions given to me and received a copy. I do not have any more questions at this time, but understand that I may call the Specialty Clinic at [facility] at [phone] during business hours should I have any further questions or need assistance in obtaining follow-up care.    __________________________________________  _____________  __________ Signature of Patient or Authorized Representative            Date                   Time    __________________________________________ Nurse's Signature

## 2012-02-01 ENCOUNTER — Other Ambulatory Visit (INDEPENDENT_AMBULATORY_CARE_PROVIDER_SITE_OTHER): Payer: Self-pay | Admitting: *Deleted

## 2012-02-01 ENCOUNTER — Telehealth (HOSPITAL_COMMUNITY): Payer: Self-pay | Admitting: Oncology

## 2012-02-01 ENCOUNTER — Other Ambulatory Visit (HOSPITAL_COMMUNITY): Payer: Self-pay | Admitting: Oncology

## 2012-02-01 ENCOUNTER — Telehealth (INDEPENDENT_AMBULATORY_CARE_PROVIDER_SITE_OTHER): Payer: Self-pay | Admitting: *Deleted

## 2012-02-01 DIAGNOSIS — Z08 Encounter for follow-up examination after completed treatment for malignant neoplasm: Secondary | ICD-10-CM

## 2012-02-01 DIAGNOSIS — D509 Iron deficiency anemia, unspecified: Secondary | ICD-10-CM

## 2012-02-01 DIAGNOSIS — K639 Disease of intestine, unspecified: Secondary | ICD-10-CM

## 2012-02-01 MED ORDER — PEG-KCL-NACL-NASULF-NA ASC-C 100 G PO SOLR: 1.0000 | Freq: Once | ORAL | Status: DC

## 2012-02-01 NOTE — Telephone Encounter (Signed)
agree

## 2012-02-01 NOTE — Telephone Encounter (Signed)
  Procedure: tcs  Reason/Indication:  Colonic wall thickening, iron def anemia  Has patient had this procedure before?  yes  If so, when, by whom and where?  11/2010  Is there a family history of colon cancer?  no  Who?  What age when diagnosed?    Is patient diabetic?   no      Does patient have prosthetic heart valve?  no  Do you have a pacemaker?  no  Has patient had joint replacement within last 12 months?  no  Is patient on Coumadin, Plavix and/or Aspirin? yes  Medications: warfarin 7 mg daily, alprazolam .5 mg daily, b12 injection 1000 mg monthly, pepcid 20 mg daily, folic acid 1 mg daily, prevacid 15 mg daily, metoprolol 25 mg bid  Allergies: see EPIC  Medication Adjustment: per Dr Karilyn Cota patient will need bridge for blood thinner, Dr Tally Due office will handle this, they are aware     of TCS appt  Procedure date & time: 02/25/12 at 1200

## 2012-02-01 NOTE — Telephone Encounter (Signed)
Unable to take lovenox due to previous reaction.  Discussed with Dr. Mariel Sleet and patient is to stop the coumadin on 02/20/12 and resume her coumadin on 1/30 after the colonoscopy.  Patient notified and verbalized understanding.

## 2012-02-01 NOTE — Progress Notes (Signed)
TCS sch'd 02/25/12 @ 1200, patient aware

## 2012-02-01 NOTE — Telephone Encounter (Signed)
Message copied by Evelena Leyden on Mon Feb 01, 2012  2:59 PM ------      Message from: Mariel Sleet, ERIC S      Created: Mon Feb 01, 2012 12:29 PM       Can you find out if she has been on Lovenox in past and can she administer to self??      Needs colonoscopy 1/30

## 2012-02-01 NOTE — Telephone Encounter (Signed)
[name] scheduled for colonoscopy 02-25-12 contact pt let her know about coumadin per Dr.Rehmans office

## 2012-02-04 ENCOUNTER — Telehealth (INDEPENDENT_AMBULATORY_CARE_PROVIDER_SITE_OTHER): Payer: Self-pay | Admitting: *Deleted

## 2012-02-04 DIAGNOSIS — Z1211 Encounter for screening for malignant neoplasm of colon: Secondary | ICD-10-CM

## 2012-02-04 MED ORDER — PEG 3350-KCL-NA BICARB-NACL 420 G PO SOLR: 4000.0000 mL | Freq: Once | ORAL | Status: DC

## 2012-02-04 NOTE — Telephone Encounter (Signed)
Patient needs trilyte, movi prep was too expensive

## 2012-02-12 ENCOUNTER — Other Ambulatory Visit (HOSPITAL_COMMUNITY): Payer: Self-pay | Admitting: Oncology

## 2012-02-12 DIAGNOSIS — D6851 Activated protein C resistance: Secondary | ICD-10-CM

## 2012-02-12 MED ORDER — WARFARIN SODIUM 2 MG PO TABS: ORAL_TABLET | ORAL | Status: DC

## 2012-02-12 MED ORDER — FOLIC ACID 1 MG PO TABS: 1.0000 mg | ORAL_TABLET | Freq: Every day | ORAL | Status: DC

## 2012-02-15 ENCOUNTER — Encounter (HOSPITAL_COMMUNITY): Payer: Self-pay | Admitting: Pharmacy Technician

## 2012-02-19 ENCOUNTER — Other Ambulatory Visit (HOSPITAL_COMMUNITY): Payer: Medicare Other

## 2012-02-25 ENCOUNTER — Ambulatory Visit (HOSPITAL_COMMUNITY)
Admission: RE | Admit: 2012-02-25 | Discharge: 2012-02-25 | Disposition: A | Discharge status: Home / Self Care | Payer: Medicare Other | Source: Ambulatory Visit | Attending: Internal Medicine | Admitting: Internal Medicine

## 2012-02-25 ENCOUNTER — Ambulatory Visit (HOSPITAL_COMMUNITY): Payer: Medicare Other

## 2012-02-25 ENCOUNTER — Encounter (HOSPITAL_COMMUNITY)
Admission: RE | Disposition: A | Discharge status: Home / Self Care | Payer: Self-pay | Source: Ambulatory Visit | Attending: Internal Medicine

## 2012-02-25 ENCOUNTER — Encounter (HOSPITAL_COMMUNITY): Payer: Self-pay | Admitting: *Deleted

## 2012-02-25 DIAGNOSIS — K562 Volvulus: Secondary | ICD-10-CM

## 2012-02-25 DIAGNOSIS — K5730 Diverticulosis of large intestine without perforation or abscess without bleeding: Secondary | ICD-10-CM

## 2012-02-25 DIAGNOSIS — R933 Abnormal findings on diagnostic imaging of other parts of digestive tract: Secondary | ICD-10-CM

## 2012-02-25 DIAGNOSIS — K639 Disease of intestine, unspecified: Secondary | ICD-10-CM

## 2012-02-25 DIAGNOSIS — K644 Residual hemorrhoidal skin tags: Secondary | ICD-10-CM | POA: Insufficient documentation

## 2012-02-25 DIAGNOSIS — D509 Iron deficiency anemia, unspecified: Secondary | ICD-10-CM

## 2012-02-25 HISTORY — PX: COLONOSCOPY: SHX5424

## 2012-02-25 SURGERY — COLONOSCOPY
Anesthesia: Moderate Sedation

## 2012-02-25 MED ORDER — MEPERIDINE HCL 50 MG/ML IJ SOLN
INTRAMUSCULAR | Status: DC | PRN
  Administered 2012-02-25 (×4): 25 mg via INTRAVENOUS

## 2012-02-25 MED ORDER — SODIUM CHLORIDE 0.9 % IV SOLN
Freq: Once | INTRAVENOUS | Status: AC
  Administered 2012-02-25: 14:00:00 via INTRAVENOUS

## 2012-02-25 MED ORDER — MEPERIDINE HCL 50 MG/ML IJ SOLN
INTRAMUSCULAR | Status: AC
  Filled 2012-02-25: qty 1

## 2012-02-25 MED ORDER — MIDAZOLAM HCL 5 MG/5ML IJ SOLN
INTRAMUSCULAR | Status: AC
  Filled 2012-02-25: qty 10

## 2012-02-25 MED ORDER — ONDANSETRON HCL 4 MG/2ML IJ SOLN
INTRAMUSCULAR | Status: AC
  Filled 2012-02-25: qty 2

## 2012-02-25 MED ORDER — STERILE WATER FOR IRRIGATION IR SOLN
Status: DC | PRN
  Administered 2012-02-25: 12:00:00

## 2012-02-25 MED ORDER — SODIUM CHLORIDE 0.45 % IV SOLN
INTRAVENOUS | Status: DC
  Administered 2012-02-25: 11:00:00 via INTRAVENOUS

## 2012-02-25 MED ORDER — MIDAZOLAM HCL 5 MG/5ML IJ SOLN
INTRAMUSCULAR | Status: DC | PRN
  Administered 2012-02-25: 2 mg via INTRAVENOUS
  Administered 2012-02-25: 3 mg via INTRAVENOUS
  Administered 2012-02-25: 2 mg via INTRAVENOUS
  Administered 2012-02-25: 3 mg via INTRAVENOUS
  Administered 2012-02-25: 2 mg via INTRAVENOUS

## 2012-02-25 MED ORDER — MIDAZOLAM HCL 5 MG/5ML IJ SOLN
INTRAMUSCULAR | Status: AC
  Filled 2012-02-25: qty 5

## 2012-02-25 MED ORDER — ONDANSETRON HCL 4 MG/2ML IJ SOLN
4.0000 mg | Freq: Once | INTRAMUSCULAR | Status: AC
  Administered 2012-02-25: 4 mg via INTRAVENOUS

## 2012-02-25 NOTE — H&P (Signed)
[name] is an 62 y.o. female.   Chief Complaint: Patient is here for colonoscopy. HPI: Patient is 62 year-old Caucasian female who had small bowel resection for GIST in December 2012. She had abdominopelvic CT. Study showed thickening to the lateral wall of cecum. She is therefore undergoing diagnostic colonoscopy. Patient's last exam was in November 2012. She denies abdominal pain melena or rectal bleeding. She has good appetite and wasted stable. Her warfarin is on hold for this procedure.  Past Medical History  Diagnosis Date  . Vitamin B12 deficiency     vit b12 1000 mcg monthly  . Cellulitis of left leg 2006  . Ulcer 05/2009    esophageal  . Clotting disorder     heterozygosity from factor v leiden  . Pernicious anemia 07/10/2010  . DVT (deep venous thrombosis) 07/10/2010    on coumadin  . Factor V Leiden   . Anxiety   . 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 Allergic urticaria 01/04/2011    Rash from tape.  Marland Kitchen GERD (gastroesophageal reflux disease)   . Ventricular tachycardia 03/26/11    Past Surgical History  Procedure Date  . Abdominal hysterectomy 1989  . Balloon dilation 12/11/2010    Procedure: BALLOON DILATION;  Surgeon: Malissa Hippo, MD;  [facility];  Service: Endoscopy;  Laterality: N/A;  . Laparotomy 01/05/2011    Procedure: EXPLORATORY LAPAROTOMY;  Surgeon: Dalia Heading;  Location: [facility];  Service: General;  Laterality: N/A;  . Bowel resection 01/05/2011    Procedure: SMALL BOWEL RESECTION;  Surgeon: Dalia Heading;  Location: [facility];  Service: General;;  Partial Small Bowel Resection  . Givens capsule study 01/02/2011    Procedure: GIVENS CAPSULE STUDY;  Surgeon: Malissa Hippo, MD;  Location: [facility];  Service: Endoscopy;  Laterality: N/A;    History reviewed. No pertinent family history. Social History:  reports that she has never smoked. She has never used smokeless tobacco. She reports that she does not drink alcohol or use  illicit drugs.  Allergies:  Allergies  Allergen Reactions  . Cephalexin Swelling  . Dexlansoprazole Swelling  . Latex Itching  . Other Swelling    Patient states that Pecans cause her mouth to swell.  Marland Kitchen Penicillins Swelling  . Tape Itching  . Enoxaparin Sodium Itching, Swelling and Palpitations  . Nylon Rash  . Omeprazole Itching and Rash  . Sulfonamide Derivatives Rash    Medications Prior to Admission  Medication Sig Dispense Refill  . ALPRAZolam (XANAX) 0.5 MG tablet Take 0.5 mg by mouth at bedtime.       . cyanocobalamin (,VITAMIN B-12,) 1000 MCG/ML injection Inject 1 mL (1,000 mcg total) into the muscle every 30 (thirty) days.  12 mL  0  . folic acid (FOLVITE) 1 MG tablet Take 1 tablet (1 mg total) by mouth daily.  30 tablet  5  . lansoprazole (PREVACID) 15 MG capsule Take 15 mg by mouth at bedtime.       . metoprolol tartrate (LOPRESSOR) 25 MG tablet Take 1 tablet (25 mg total) by mouth 2 (two) times daily.  60 tablet  12  . warfarin (COUMADIN) 2 MG tablet Patient takes 7 mg daily.  30 tablet  3  . warfarin (COUMADIN) 5 MG tablet Patient is taking 7 mg every day    5  . acetaminophen (TYLENOL) 325 MG tablet Take 650 mg by mouth every 6 (six) hours as needed. Pain  No results found for this or any previous visit (from the past 48 hour(s)). No results found.  ROS  Blood pressure 155/73, pulse 65, temperature 97.6 F (36.4 C), temperature source Oral, resp. rate 14, height 5\' 7"  (1.702 m), weight 251 lb (113.853 kg), SpO2 97.00%. Physical Exam  Constitutional: She appears well-developed and well-nourished.  HENT:  Mouth/Throat: Oropharynx is clear and moist.  Eyes: Conjunctivae normal are normal. No scleral icterus.  Neck: No thyromegaly present.  Cardiovascular: Normal rate, regular rhythm and normal heart sounds.   No murmur heard. Respiratory: Effort normal and breath sounds normal.  GI: Soft. She exhibits no distension and no mass. There is no tenderness.   Musculoskeletal: She exhibits no edema.  Lymphadenopathy:    She has no cervical adenopathy.  Neurological: She is alert.  Skin: Skin is warm and dry.     Assessment/Plan Abnormal CT with thickening to cecal wall. Diagnostic colonoscopy.  Annaelle Kasel U 02/25/2012, 11:54 AM

## 2012-02-25 NOTE — Op Note (Signed)
COLONOSCOPY PROCEDURE REPORT  PATIENT:  [name]  MR#:  [id number] Birthdate:  10/08/1950, 62 y.o., female Endoscopist:  Dr. Malissa Hippo, MD Referred By:  Dr. Glenford Peers, MD Procedure Date: 02/25/2012  Procedure:   Colonoscopy(incomplete to hepatic flexure)  Indications:  Patient is 62 year old female who is known to have thickening to the lateral of the cecum abdominopelvic CT and therefore undergoing diagnostic examination. Her last examination was in November 2012.  Informed Consent:  The procedure and risks were reviewed with the patient and informed consent was obtained.  Medications:  Demerol 100 mg IV Versed 12 mg IV  Description of procedure:  After a digital rectal exam was performed, that colonoscope was advanced from the anus through the rectum and colon to the area of of hepatic flexure. Cecum could not be reached. From the level of hepatic flexure, scope was slowly and cautiously withdrawn. The mucosal surfaces were carefully surveyed utilizing scope tip to flexion to facilitate fold flattening as needed. The scope was pulled down into the rectum where a thorough exam including retroflexion was performed.  Findings:   Excellent prep. Redundant colon with looping sigmoid colon which could not be reduced. Therefore examination was not complete. Scope was advanced to hepatic flexure. Few scattered diverticula at sigmoid colon. Normal rectum mucosa. Small hemorrhoids below the dentate line.  Therapeutic/Diagnostic Maneuvers Performed:  None  Complications:  None  Cecal Withdrawal Time: NA  Impression:  Incomplete exam to hepatic flexure on account of looping and sigmoid colon which could not be reduced. Scattered diverticula at sigmoid colon. External hemorrhoids.  Recommendations:  Will proceed with barium enema today. Patient will resume warfarin at usual dose. I would be contacting patient with results of barium enema.  REHMAN,NAJEEB U  02/25/2012  12:51 PM  CC: Dr. Rudi Heap, MD & Dr. Bonnetta Barry ref. provider found         Dr. Glenford Peers, MD

## 2012-02-25 NOTE — Progress Notes (Signed)
At 1355, pt's husband came to me and said pt felt "sick to her stomach". I went in to assess pt and she reported "not feeling well". Pt was very pale and weak looking. Pt said she felt like she was going to "pass out and throw up". Immediately pt was hooked up to monitor to obtain vitals. I reclined pt back in chair while waiting for vitals. HR of 40, Resp 16, BP 76/44, O2 sat of 93% on RA. Dr. Karilyn Cota was immediately notified and gave order to place another IV (pt's procedure IV had just been taken out prior to this episode), give a bolus of NS and to give Zofran 4mg  IV. Orders were initiated. A few minutes later pt's vitals were HR 50, Resp 16, BP 91/59, O2 sat 94% on RA. Pt started regaining some color back in her face. Pt said she still "felt bad, but much better than I did". Dr. Karilyn Cota came in to assess pt at this time. Will continue to monitor pt closely.

## 2012-02-26 ENCOUNTER — Encounter (HOSPITAL_COMMUNITY): Payer: Self-pay | Admitting: Internal Medicine

## 2012-03-03 ENCOUNTER — Encounter (HOSPITAL_COMMUNITY): Payer: Medicare Other | Attending: Internal Medicine

## 2012-03-03 ENCOUNTER — Other Ambulatory Visit (HOSPITAL_COMMUNITY): Payer: Self-pay | Admitting: Oncology

## 2012-03-03 DIAGNOSIS — D6859 Other primary thrombophilia: Secondary | ICD-10-CM | POA: Insufficient documentation

## 2012-03-03 LAB — PROTIME-INR
INR: 1.84 — ABNORMAL HIGH (ref 0.00–1.49)
Prothrombin Time: 20.6 seconds — ABNORMAL HIGH (ref 11.6–15.2)

## 2012-03-03 NOTE — Progress Notes (Signed)
Pt instructed to continue taking 7mg  of coumadin daily and return in 1 week. 2/13 @ 830. Pt verbalized understanding of all instructions.

## 2012-03-03 NOTE — Progress Notes (Signed)
Labs drawn today for pt 

## 2012-03-09 ENCOUNTER — Encounter (HOSPITAL_BASED_OUTPATIENT_CLINIC_OR_DEPARTMENT_OTHER): Payer: Medicare Other

## 2012-03-09 DIAGNOSIS — D6851 Activated protein C resistance: Secondary | ICD-10-CM

## 2012-03-09 DIAGNOSIS — D6859 Other primary thrombophilia: Secondary | ICD-10-CM

## 2012-03-09 NOTE — Progress Notes (Signed)
Labs drawn today for pt 

## 2012-03-10 ENCOUNTER — Other Ambulatory Visit (HOSPITAL_COMMUNITY): Payer: Medicare Other

## 2012-03-23 ENCOUNTER — Encounter (HOSPITAL_BASED_OUTPATIENT_CLINIC_OR_DEPARTMENT_OTHER): Payer: Medicare Other

## 2012-03-23 DIAGNOSIS — D6851 Activated protein C resistance: Secondary | ICD-10-CM

## 2012-03-23 DIAGNOSIS — D6859 Other primary thrombophilia: Secondary | ICD-10-CM

## 2012-03-23 LAB — PROTIME-INR: Prothrombin Time: 30.9 seconds — ABNORMAL HIGH (ref 11.6–15.2)

## 2012-03-23 NOTE — Progress Notes (Signed)
Labs drawn today for pt 

## 2012-03-29 ENCOUNTER — Other Ambulatory Visit (HOSPITAL_COMMUNITY): Payer: Self-pay | Admitting: Oncology

## 2012-03-29 DIAGNOSIS — D6851 Activated protein C resistance: Secondary | ICD-10-CM

## 2012-03-29 MED ORDER — WARFARIN SODIUM 5 MG PO TABS: ORAL_TABLET | ORAL | Status: DC

## 2012-03-31 ENCOUNTER — Encounter (HOSPITAL_COMMUNITY): Payer: Medicare Other | Attending: Internal Medicine

## 2012-03-31 DIAGNOSIS — D6859 Other primary thrombophilia: Secondary | ICD-10-CM | POA: Insufficient documentation

## 2012-03-31 DIAGNOSIS — D6851 Activated protein C resistance: Secondary | ICD-10-CM

## 2012-03-31 LAB — PROTIME-INR: INR: 2.54 — ABNORMAL HIGH (ref 0.00–1.49)

## 2012-04-01 ENCOUNTER — Other Ambulatory Visit (HOSPITAL_COMMUNITY): Payer: Medicare Other

## 2012-04-07 ENCOUNTER — Other Ambulatory Visit (HOSPITAL_COMMUNITY): Payer: Self-pay | Admitting: Oncology

## 2012-04-07 ENCOUNTER — Encounter (HOSPITAL_COMMUNITY): Payer: Medicare Other

## 2012-04-07 DIAGNOSIS — D6851 Activated protein C resistance: Secondary | ICD-10-CM

## 2012-04-07 LAB — PROTIME-INR
INR: 3.33 — ABNORMAL HIGH (ref 0.00–1.49)
Prothrombin Time: 31.9 seconds — ABNORMAL HIGH (ref 11.6–15.2)

## 2012-04-07 NOTE — Progress Notes (Signed)
Labs  Drawn today for pt

## 2012-04-14 ENCOUNTER — Encounter (HOSPITAL_COMMUNITY): Payer: Medicare Other

## 2012-04-14 ENCOUNTER — Other Ambulatory Visit (HOSPITAL_COMMUNITY): Payer: Self-pay | Admitting: Oncology

## 2012-04-14 DIAGNOSIS — D6851 Activated protein C resistance: Secondary | ICD-10-CM

## 2012-04-14 LAB — PROTIME-INR
INR: 2.62 — ABNORMAL HIGH (ref 0.00–1.49)
Prothrombin Time: 26.7 seconds — ABNORMAL HIGH (ref 11.6–15.2)

## 2012-04-14 NOTE — Progress Notes (Signed)
Labs drawn today for pt 

## 2012-04-25 ENCOUNTER — Encounter (HOSPITAL_BASED_OUTPATIENT_CLINIC_OR_DEPARTMENT_OTHER): Payer: Medicare Other

## 2012-04-25 DIAGNOSIS — D6859 Other primary thrombophilia: Secondary | ICD-10-CM

## 2012-04-25 DIAGNOSIS — D6851 Activated protein C resistance: Secondary | ICD-10-CM

## 2012-04-25 LAB — PROTIME-INR: Prothrombin Time: 25.1 seconds — ABNORMAL HIGH (ref 11.6–15.2)

## 2012-04-25 NOTE — Progress Notes (Signed)
Labs drawn today for pt 

## 2012-04-26 MED ORDER — CYANOCOBALAMIN 1000 MCG/ML IJ SOLN
INTRAMUSCULAR | Status: AC
  Filled 2012-04-26: qty 1

## 2012-04-29 ENCOUNTER — Encounter (HOSPITAL_COMMUNITY): Payer: Medicare Other | Attending: Internal Medicine

## 2012-04-29 ENCOUNTER — Telehealth (HOSPITAL_COMMUNITY): Payer: Self-pay | Admitting: *Deleted

## 2012-04-29 DIAGNOSIS — D509 Iron deficiency anemia, unspecified: Secondary | ICD-10-CM | POA: Insufficient documentation

## 2012-04-29 DIAGNOSIS — D6859 Other primary thrombophilia: Secondary | ICD-10-CM

## 2012-04-29 LAB — CBC WITH DIFFERENTIAL/PLATELET
Basophils Relative: 0 % (ref 0–1)
Eosinophils Absolute: 0.2 10*3/uL (ref 0.0–0.7)
Eosinophils Relative: 3 % (ref 0–5)
Lymphs Abs: 2.4 10*3/uL (ref 0.7–4.0)
MCH: 28.9 pg (ref 26.0–34.0)
MCHC: 32.6 g/dL (ref 30.0–36.0)
MCV: 88.8 fL (ref 78.0–100.0)
Neutrophils Relative %: 45 % (ref 43–77)
Platelets: 289 10*3/uL (ref 150–400)
RDW: 13.4 % (ref 11.5–15.5)

## 2012-04-29 LAB — IRON AND TIBC
Iron: 79 ug/dL (ref 42–135)
TIBC: 317 ug/dL (ref 250–470)

## 2012-04-29 LAB — FERRITIN: Ferritin: 36 ng/mL (ref 10–291)

## 2012-04-29 NOTE — Progress Notes (Signed)
[name] presented for labwork. Labs per MD order drawn via Peripheral Line 23 gauge needle inserted in LT AC Good blood return present. Procedure without incident.  Needle removed intact. Patient tolerated procedure well.

## 2012-04-29 NOTE — Telephone Encounter (Signed)
[name] called and said she had been dragging and really no energy and was wondering if she should start back on her Iron Pills that you told her to stop.

## 2012-05-12 ENCOUNTER — Telehealth (HOSPITAL_COMMUNITY): Payer: Self-pay | Admitting: *Deleted

## 2012-05-12 LAB — PROTIME-INR: INR: 4.1 — AB (ref 0.9–1.1)

## 2012-05-12 NOTE — Telephone Encounter (Signed)
Current INR 4.1. Current Coumadin dosing Thur & Friday 5mg , Sat-Sun 7mg , Mon 6mg , Tues-Wed 7mg . Orders from Tom to hold coumadin x 2 days. Restart coumadin on Saturday at 5mg . Sunday 5mg . Monday - Friday 6mg . Recheck INR on Monday 05/16/12. Patient verbalized understanding of instructions.

## 2012-05-16 ENCOUNTER — Telehealth (HOSPITAL_COMMUNITY): Payer: Self-pay

## 2012-05-16 ENCOUNTER — Other Ambulatory Visit (HOSPITAL_COMMUNITY): Payer: Medicare Other

## 2012-05-16 LAB — PROTIME-INR

## 2012-05-16 NOTE — Telephone Encounter (Signed)
Patient called with instructions for Coumadin dosing and retesting of PT/INR.  Patient verbalized understanding.

## 2012-05-23 ENCOUNTER — Encounter (INDEPENDENT_AMBULATORY_CARE_PROVIDER_SITE_OTHER): Payer: Self-pay | Admitting: *Deleted

## 2012-05-23 LAB — PROTIME-INR: INR: 2.3 — AB (ref 0.9–1.1)

## 2012-05-25 ENCOUNTER — Other Ambulatory Visit: Payer: Self-pay | Admitting: *Deleted

## 2012-05-25 NOTE — Telephone Encounter (Signed)
Patient last seen here in office on 1--1-13. Rx last filled on 03-29-12 for #60. Please advise. If approved please have nurse phone in to [facility] in [place]. Thank you

## 2012-05-25 NOTE — Telephone Encounter (Signed)
Patient needs to be seen for refills. Not seen in over a year.

## 2012-05-26 ENCOUNTER — Other Ambulatory Visit (HOSPITAL_COMMUNITY): Payer: Self-pay | Admitting: Oncology

## 2012-05-26 ENCOUNTER — Telehealth: Payer: Self-pay | Admitting: *Deleted

## 2012-05-26 DIAGNOSIS — D6851 Activated protein C resistance: Secondary | ICD-10-CM

## 2012-05-26 DIAGNOSIS — F419 Anxiety disorder, unspecified: Secondary | ICD-10-CM

## 2012-05-26 MED ORDER — WARFARIN SODIUM 5 MG PO TABS: ORAL_TABLET | ORAL | Status: DC

## 2012-05-26 MED ORDER — ALPRAZOLAM 0.5 MG PO TABS: 0.5000 mg | ORAL_TABLET | Freq: Every day | ORAL | Status: DC

## 2012-05-26 NOTE — Telephone Encounter (Signed)
LMOM at both numbers for pt. RX called to [facility].

## 2012-05-26 NOTE — Telephone Encounter (Signed)
ADDENDUM:  Script not authorized.  Cancelled order at pharmacy. Left apology and new messages on patient's phones.

## 2012-05-30 ENCOUNTER — Encounter: Payer: Self-pay | Admitting: Oncology

## 2012-06-07 ENCOUNTER — Other Ambulatory Visit (HOSPITAL_COMMUNITY): Payer: Medicare Other

## 2012-06-08 ENCOUNTER — Encounter (HOSPITAL_COMMUNITY): Payer: Medicare Other | Attending: Internal Medicine

## 2012-06-08 DIAGNOSIS — D6859 Other primary thrombophilia: Secondary | ICD-10-CM

## 2012-06-08 DIAGNOSIS — D509 Iron deficiency anemia, unspecified: Secondary | ICD-10-CM | POA: Insufficient documentation

## 2012-06-08 LAB — DIFFERENTIAL
Lymphs Abs: 2.1 10*3/uL (ref 0.7–4.0)
Monocytes Absolute: 0.5 10*3/uL (ref 0.1–1.0)
Monocytes Relative: 11 % (ref 3–12)
Neutro Abs: 1.9 10*3/uL (ref 1.7–7.7)
Neutrophils Relative %: 40 % — ABNORMAL LOW (ref 43–77)

## 2012-06-08 LAB — CBC
HCT: 41.1 % (ref 36.0–46.0)
Hemoglobin: 13.9 g/dL (ref 12.0–15.0)
MCH: 29.6 pg (ref 26.0–34.0)
MCHC: 33.8 g/dL (ref 30.0–36.0)
RBC: 4.69 MIL/uL (ref 3.87–5.11)

## 2012-06-08 LAB — FERRITIN: Ferritin: 31 ng/mL (ref 10–291)

## 2012-06-08 LAB — IRON AND TIBC
Saturation Ratios: 32 % (ref 20–55)
TIBC: 273 ug/dL (ref 250–470)

## 2012-06-08 NOTE — Progress Notes (Signed)
Labs drawn today for cbc/diff,ferr,Iron and IBC 

## 2012-06-10 ENCOUNTER — Telehealth (HOSPITAL_COMMUNITY): Payer: Self-pay

## 2012-06-10 ENCOUNTER — Ambulatory Visit (HOSPITAL_COMMUNITY): Payer: Medicare Other | Admitting: Oncology

## 2012-06-10 NOTE — Telephone Encounter (Signed)
Message left for patient to call clinic regarding iron.

## 2012-06-10 NOTE — Telephone Encounter (Signed)
Message copied by Evelena Leyden on Fri Jun 10, 2012 12:42 PM ------      Message from: Mariel Sleet, ERIC S      Created: Fri Jun 10, 2012 11:19 AM       Iron studies       Is she on any iron now or did we give her IV? ------

## 2012-06-13 ENCOUNTER — Encounter: Payer: Self-pay | Admitting: Oncology

## 2012-06-20 LAB — PROTIME-INR: INR: 2.1 — AB (ref 0.9–1.1)

## 2012-06-27 ENCOUNTER — Ambulatory Visit (INDEPENDENT_AMBULATORY_CARE_PROVIDER_SITE_OTHER): Payer: Medicare Other | Admitting: Internal Medicine

## 2012-06-27 VITALS — BP 145/88 | HR 53 | Resp 18 | Wt 253.0 lb

## 2012-06-27 DIAGNOSIS — R635 Abnormal weight gain: Secondary | ICD-10-CM

## 2012-06-27 DIAGNOSIS — R001 Bradycardia, unspecified: Secondary | ICD-10-CM

## 2012-06-27 DIAGNOSIS — R55 Syncope and collapse: Secondary | ICD-10-CM

## 2012-06-27 DIAGNOSIS — I472 Ventricular tachycardia: Secondary | ICD-10-CM

## 2012-06-27 DIAGNOSIS — I498 Other specified cardiac arrhythmias: Secondary | ICD-10-CM

## 2012-06-27 DIAGNOSIS — I471 Supraventricular tachycardia: Secondary | ICD-10-CM

## 2012-06-27 MED ORDER — METOPROLOL TARTRATE 25 MG PO TABS: 25.0000 mg | ORAL_TABLET | Freq: Two times a day (BID) | ORAL | Status: DC

## 2012-06-27 NOTE — Assessment & Plan Note (Signed)
I spent 20 minutes couciling the patient regarding weight loss, particularly exercising more. She states that she will try and do better.

## 2012-06-27 NOTE — Progress Notes (Signed)
HPI Mrs. Stengel returns today for followup. She is a pleasant middle aged woman with a h/o palpitations after chemotherapy, HTN and obesity. In the interim, she has done well except that she has gained weight - over 30 lbs in the past year, admitting to dietary indiscretion. She has not had any syncope. She has started to exercise.  Allergies  Allergen Reactions  . Cephalexin Swelling  . Dexlansoprazole Swelling  . Latex Itching  . Other Swelling    Patient states that Pecans cause her mouth to swell.  Marland Kitchen Penicillins Swelling  . Tape Itching  . Enoxaparin Sodium Itching, Swelling and Palpitations  . Nylon Rash  . Omeprazole Itching and Rash  . Sulfonamide Derivatives Rash     Current Outpatient Prescriptions  Medication Sig Dispense Refill  . acetaminophen (TYLENOL) 325 MG tablet Take 650 mg by mouth every 6 (six) hours as needed. Pain      . ALPRAZolam (XANAX) 0.5 MG tablet Take 1 tablet (0.5 mg total) by mouth at bedtime.  30 tablet  0  . cyanocobalamin (,VITAMIN B-12,) 1000 MCG/ML injection Inject 1 mL (1,000 mcg total) into the muscle every 30 (thirty) days.  12 mL  0  . diphenhydrAMINE (SOMINEX) 25 MG tablet Take 25 mg by mouth at bedtime as needed for sleep.      . iron polysaccharides (FERREX 150) 150 MG capsule Take 150 mg by mouth daily.      . lansoprazole (PREVACID) 15 MG capsule Take 15 mg by mouth 2 (two) times daily.       . metoprolol tartrate (LOPRESSOR) 25 MG tablet Take 1 tablet (25 mg total) by mouth 2 (two) times daily.  60 tablet  12  . warfarin (COUMADIN) 2 MG tablet Patient takes 7 mg daily.  30 tablet  3  . warfarin (COUMADIN) 5 MG tablet Patient is taking 7 mg every day  60 tablet  5  . folic acid (FOLVITE) 1 MG tablet Take 1 tablet (1 mg total) by mouth daily.  30 tablet  5  . [DISCONTINUED] diphenhydrAMINE (BENADRYL) 25 mg capsule Take 25 mg by mouth every 6 (six) hours as needed. Itching or allergies       No current facility-administered medications for this  visit.     Past Medical History  Diagnosis Date  . Vitamin B12 deficiency     vit b12 1000 mcg monthly  . Cellulitis of left leg 2006  . Ulcer 05/2009    esophageal  . Clotting disorder     heterozygosity from factor v leiden  . Pernicious anemia 07/10/2010  . DVT (deep venous thrombosis) 07/10/2010    on coumadin  . Factor V Leiden   . Anxiety   . 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 Allergic urticaria 01/04/2011    Rash from tape.  Marland Kitchen GERD (gastroesophageal reflux disease)   . Ventricular tachycardia 03/26/11    ROS:   All systems reviewed and negative except as noted in the HPI.   Past Surgical History  Procedure Laterality Date  . Abdominal hysterectomy  1989  . Balloon dilation  12/11/2010    Procedure: BALLOON DILATION;  Surgeon: Malissa Hippo, MD;  [facility];  Service: Endoscopy;  Laterality: N/A;  . Laparotomy  01/05/2011    Procedure: EXPLORATORY LAPAROTOMY;  Surgeon: Dalia Heading;  Location: [facility];  Service: General;  Laterality: N/A;  . Bowel resection  01/05/2011    Procedure: SMALL BOWEL RESECTION;  Surgeon: Dalia Heading;  Location: [facility];  Service: General;;  Partial Small Bowel Resection  . Givens capsule study  01/02/2011    Procedure: GIVENS CAPSULE STUDY;  Surgeon: Malissa Hippo, MD;  [facility];  Service: Endoscopy;  Laterality: N/A;  . Colonoscopy  02/25/2012    Procedure: COLONOSCOPY;  Surgeon: Malissa Hippo, MD;  [facility];  Service: Endoscopy;  Laterality: N/A;  1200     No family history on file.   History   Social History  . Marital Status: Married    Spouse Name: N/A    Number of Children: N/A  . Years of Education: N/A   Occupational History  . Not on file.   Social History Main Topics  . Smoking status: Never Smoker   . Smokeless tobacco: Never Used  . Alcohol Use: No  . Drug Use: No  . Sexually Active: Not Currently   Other Topics Concern  . Not on file   Social  History Narrative  . No narrative on file     BP 145/88  Pulse 53  Resp 18  Wt 253 lb (114.76 kg)  BMI 39.62 kg/m2  Physical Exam:  Well appearing middle aged woman, NAD HEENT: Unremarkable Neck:  6 cm JVD, no thyromegally Back:  No CVA tenderness Lungs:  Clear with no wheezes HEART:  Regular rate rhythm, no murmurs, no rubs, no clicks Abd:  soft, positive bowel sounds, no organomegally, no rebound, no guarding Ext:  2 plus pulses, trace edema, but with venous insufficiency Skin:  No rashes no nodules Neuro:  CN II through XII intact, motor grossly intact  EKG NSR   Assess/Plan:

## 2012-06-27 NOTE — Patient Instructions (Addendum)
Your physician wants you to follow-up in: 6 MONTHS with Dr Ladona Ridgel.  You will receive a reminder letter in the mail two months in advance. If you don't receive a letter, please call our office to schedule the follow-up appointment.  Your physician recommends that you continue on your current medications as directed. Please refer to the Current Medication list given to you today.

## 2012-06-27 NOTE — Assessment & Plan Note (Signed)
Her VT has been well controlled. She will continue her medical therapy.

## 2012-07-04 ENCOUNTER — Telehealth (HOSPITAL_COMMUNITY): Payer: Self-pay

## 2012-07-04 NOTE — Telephone Encounter (Signed)
Call from [facility].  States "I saw my cardiologist, Dr. Ladona Ridgel and he said I needed to lose some weight.  I told him that was going to be hard to do because of my food restrictions because of my coumadin.  He told me to talk with Dr. Mariel Sleet about putting me on a different medication than coumadin - one that I didn't need to monitor my INR."  Patient notified that Dr. Mariel Sleet would be out of the office for 2 weeks and she wanted him to let her know if she could be on a different medication instead of the coumadin.  Note to be forwarded to Dr. Mariel Sleet for his review.

## 2012-07-11 ENCOUNTER — Telehealth (HOSPITAL_COMMUNITY): Payer: Self-pay | Admitting: *Deleted

## 2012-07-11 LAB — PROTIME-INR: INR: 1.9 — AB (ref 0.9–1.1)

## 2012-07-11 NOTE — Telephone Encounter (Signed)
Spoke with pt. She is checking pt/inr weekly. Current Coumadin dose 5/5/6/6. Left message on call back for pt to take Coumadin 6mg  x 2 days then start back 5/5/6/6. Recheck pt/inr in 1 week/ instruct pt to return call to clinic to verify message received and understood.

## 2012-07-18 ENCOUNTER — Encounter (HOSPITAL_COMMUNITY): Payer: Medicare Other | Attending: Internal Medicine

## 2012-07-18 DIAGNOSIS — D509 Iron deficiency anemia, unspecified: Secondary | ICD-10-CM | POA: Insufficient documentation

## 2012-07-18 DIAGNOSIS — I82409 Acute embolism and thrombosis of unspecified deep veins of unspecified lower extremity: Secondary | ICD-10-CM | POA: Insufficient documentation

## 2012-07-18 DIAGNOSIS — D510 Vitamin B12 deficiency anemia due to intrinsic factor deficiency: Secondary | ICD-10-CM | POA: Insufficient documentation

## 2012-07-18 LAB — CBC WITH DIFFERENTIAL/PLATELET
Basophils Absolute: 0 10*3/uL (ref 0.0–0.1)
Basophils Relative: 0 % (ref 0–1)
Eosinophils Absolute: 0.1 10*3/uL (ref 0.0–0.7)
MCH: 29.2 pg (ref 26.0–34.0)
MCHC: 32.4 g/dL (ref 30.0–36.0)
Neutro Abs: 2.9 10*3/uL (ref 1.7–7.7)
Neutrophils Relative %: 48 % (ref 43–77)
Platelets: 265 10*3/uL (ref 150–400)
RDW: 13.5 % (ref 11.5–15.5)

## 2012-07-18 LAB — IRON AND TIBC
Iron: 81 ug/dL (ref 42–135)
TIBC: 293 ug/dL (ref 250–470)

## 2012-07-18 NOTE — Progress Notes (Signed)
Labs drawn today for cbc/diff,ferr,Iron and IBC 

## 2012-07-22 ENCOUNTER — Encounter (HOSPITAL_COMMUNITY): Payer: Self-pay | Admitting: Oncology

## 2012-07-22 ENCOUNTER — Encounter (HOSPITAL_BASED_OUTPATIENT_CLINIC_OR_DEPARTMENT_OTHER): Payer: Medicare Other | Admitting: Oncology

## 2012-07-22 VITALS — BP 131/80 | HR 44 | Temp 97.4°F | Resp 18 | Wt 253.4 lb

## 2012-07-22 DIAGNOSIS — D509 Iron deficiency anemia, unspecified: Secondary | ICD-10-CM

## 2012-07-22 DIAGNOSIS — Z86718 Personal history of other venous thrombosis and embolism: Secondary | ICD-10-CM

## 2012-07-22 DIAGNOSIS — I803 Phlebitis and thrombophlebitis of lower extremities, unspecified: Secondary | ICD-10-CM

## 2012-07-22 DIAGNOSIS — D510 Vitamin B12 deficiency anemia due to intrinsic factor deficiency: Secondary | ICD-10-CM

## 2012-07-22 DIAGNOSIS — I82402 Acute embolism and thrombosis of unspecified deep veins of left lower extremity: Secondary | ICD-10-CM

## 2012-07-22 DIAGNOSIS — D6859 Other primary thrombophilia: Secondary | ICD-10-CM

## 2012-07-22 MED ORDER — POLYSACCHARIDE IRON COMPLEX 150 MG PO CAPS: 150.0000 mg | ORAL_CAPSULE | Freq: Every day | ORAL | Status: DC

## 2012-07-22 NOTE — Progress Notes (Signed)
She has Factor V Leiden heterozygosity with multiple DVTs and superficial phlebitis of the left leg.  She has been on lifelong coumadin with good control. She would be a candidate for Xarelto if she desires but since she is not having difficulty being therapeutic, I have told her she can decide what she wants to do over the course of the next month or so.  #2 GIST, Stage IIB, unable to tolerate adjuvant Gleevec and not interested in trying any other drug. Needs periodic CT scans in f/u  #3 Iron deficiency and she is doing well presently and will decrease her iron to 3x per week.  Her vital signs are stable except for her weight and she is trying to address this. Her left left has these long-standing chronic phlebitic changes which are not new or different.  We discussed the xarelto for sometime and she will think things over. Would consider ct scan again in Dec of 2014

## 2012-07-22 NOTE — Patient Instructions (Addendum)
[facility] Discharge Instructions  RECOMMENDATIONS MADE BY THE CONSULTANT AND ANY TEST RESULTS WILL BE SENT TO YOUR REFERRING PHYSICIAN.  EXAM FINDINGS BY THE PHYSICIAN TODAY AND SIGNS OR SYMPTOMS TO REPORT TO CLINIC OR PRIMARY PHYSICIAN: discussion by MD. We can switch to xarelto if you want, you can think about it and let us know.  MEDICATIONS PRESCRIBED:  Use Tylenol PM for sleep  INSTRUCTIONS GIVEN AND DISCUSSED: Report swelling of extremities or other problems.  SPECIAL INSTRUCTIONS/FOLLOW-UP: Follow-up and blood work in 6 months.  Thank you for choosing [facility] to provide your oncology and hematology care.  To afford each patient quality time with our providers, please arrive at least 15 minutes before your scheduled appointment time.  With your help, our goal is to use those 15 minutes to complete the necessary work-up to ensure our physicians have the information they need to help with your evaluation and healthcare recommendations.    Effective January 1st, 2014, we ask that you re-schedule your appointment with our physicians should you arrive 10 or more minutes late for your appointment.  We strive to give you quality time with our providers, and arriving late affects you and other patients whose appointments are after yours.    Again, thank you for choosing [facility].  Our hope is that these requests will decrease the amount of time that you wait before being seen by our physicians.       _____________________________________________________________  Should you have questions after your visit to [facility], please contact our office at [phone] between the hours of 8:30 a.m. and 5:00 p.m.  Voicemails left after 4:30 p.m. will not be returned until the following business day.  For prescription refill requests, have your pharmacy contact our office with your prescription refill request.

## 2012-07-25 ENCOUNTER — Telehealth (HOSPITAL_COMMUNITY): Payer: Self-pay

## 2012-07-25 NOTE — Telephone Encounter (Signed)
Patient instructed to increase coumadin to 6mg  x 3 days then back to 5mg , 5mg , 6mg , 6mg  etc. and to return for next PT/INR in 1 week.  Verbalizes understanding.

## 2012-07-27 ENCOUNTER — Ambulatory Visit (HOSPITAL_COMMUNITY): Payer: Medicare Other | Admitting: Oncology

## 2012-08-01 ENCOUNTER — Encounter (HOSPITAL_COMMUNITY): Payer: Self-pay

## 2012-08-01 ENCOUNTER — Other Ambulatory Visit (HOSPITAL_COMMUNITY): Payer: Self-pay | Admitting: Oncology

## 2012-08-01 DIAGNOSIS — D509 Iron deficiency anemia, unspecified: Secondary | ICD-10-CM

## 2012-08-01 DIAGNOSIS — D510 Vitamin B12 deficiency anemia due to intrinsic factor deficiency: Secondary | ICD-10-CM

## 2012-08-01 DIAGNOSIS — I82402 Acute embolism and thrombosis of unspecified deep veins of left lower extremity: Secondary | ICD-10-CM

## 2012-08-01 MED ORDER — WARFARIN SODIUM 5 MG PO TABS: 5.0000 mg | ORAL_TABLET | Freq: Every day | ORAL | Status: DC

## 2012-08-01 NOTE — Progress Notes (Signed)
Patient instructed to increase coumadin to 6mg   daily and to recheck INR in 1 week.  Verbalizes understanding.

## 2012-08-12 ENCOUNTER — Encounter: Payer: Self-pay | Admitting: Oncology

## 2012-08-15 ENCOUNTER — Other Ambulatory Visit (HOSPITAL_COMMUNITY): Payer: Self-pay | Admitting: Oncology

## 2012-08-16 ENCOUNTER — Telehealth (HOSPITAL_COMMUNITY): Payer: Self-pay

## 2012-08-16 LAB — POCT INR: INR: 1.9

## 2012-08-16 NOTE — Telephone Encounter (Signed)
Patient notified to change coumadin to 7mg , 6mg , 6mg , 7mg , 6mg , 6mg , etc and INR in 1 week.

## 2012-08-22 LAB — PROTIME-INR

## 2012-08-30 ENCOUNTER — Encounter (HOSPITAL_COMMUNITY): Payer: Medicare Other | Attending: Internal Medicine

## 2012-08-30 DIAGNOSIS — D6851 Activated protein C resistance: Secondary | ICD-10-CM

## 2012-08-30 DIAGNOSIS — D6859 Other primary thrombophilia: Secondary | ICD-10-CM | POA: Insufficient documentation

## 2012-08-30 LAB — PROTIME-INR: Prothrombin Time: 30.2 seconds — ABNORMAL HIGH (ref 11.6–15.2)

## 2012-08-30 NOTE — Progress Notes (Signed)
Labs drawn today for pt 

## 2012-08-31 LAB — PROTIME-INR

## 2012-09-09 IMAGING — CR DG SHOULDER 2+V*R*
3 series · 3 of 3 positions shown · non-contrast
Comparison: None
Correlation:  Chest radiograph 10/31/2004

CLINICAL DATA: Right shoulder and humeral pain post fall

RIGHT SHOULDER - 2+ VIEW

[view not recorded (1 of 3)]
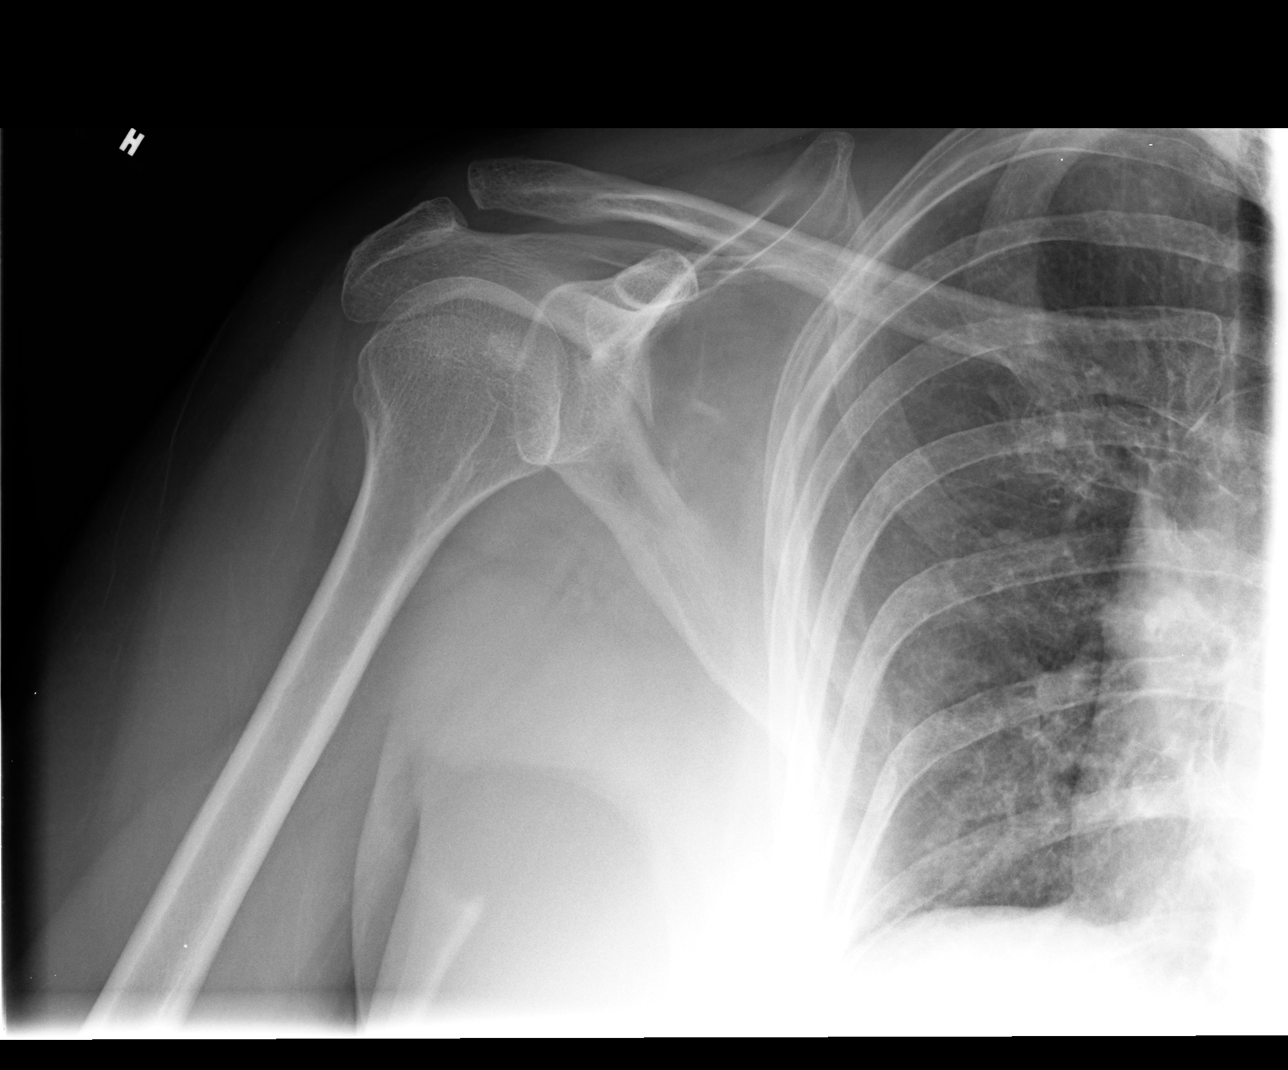

[view not recorded (2 of 3)]
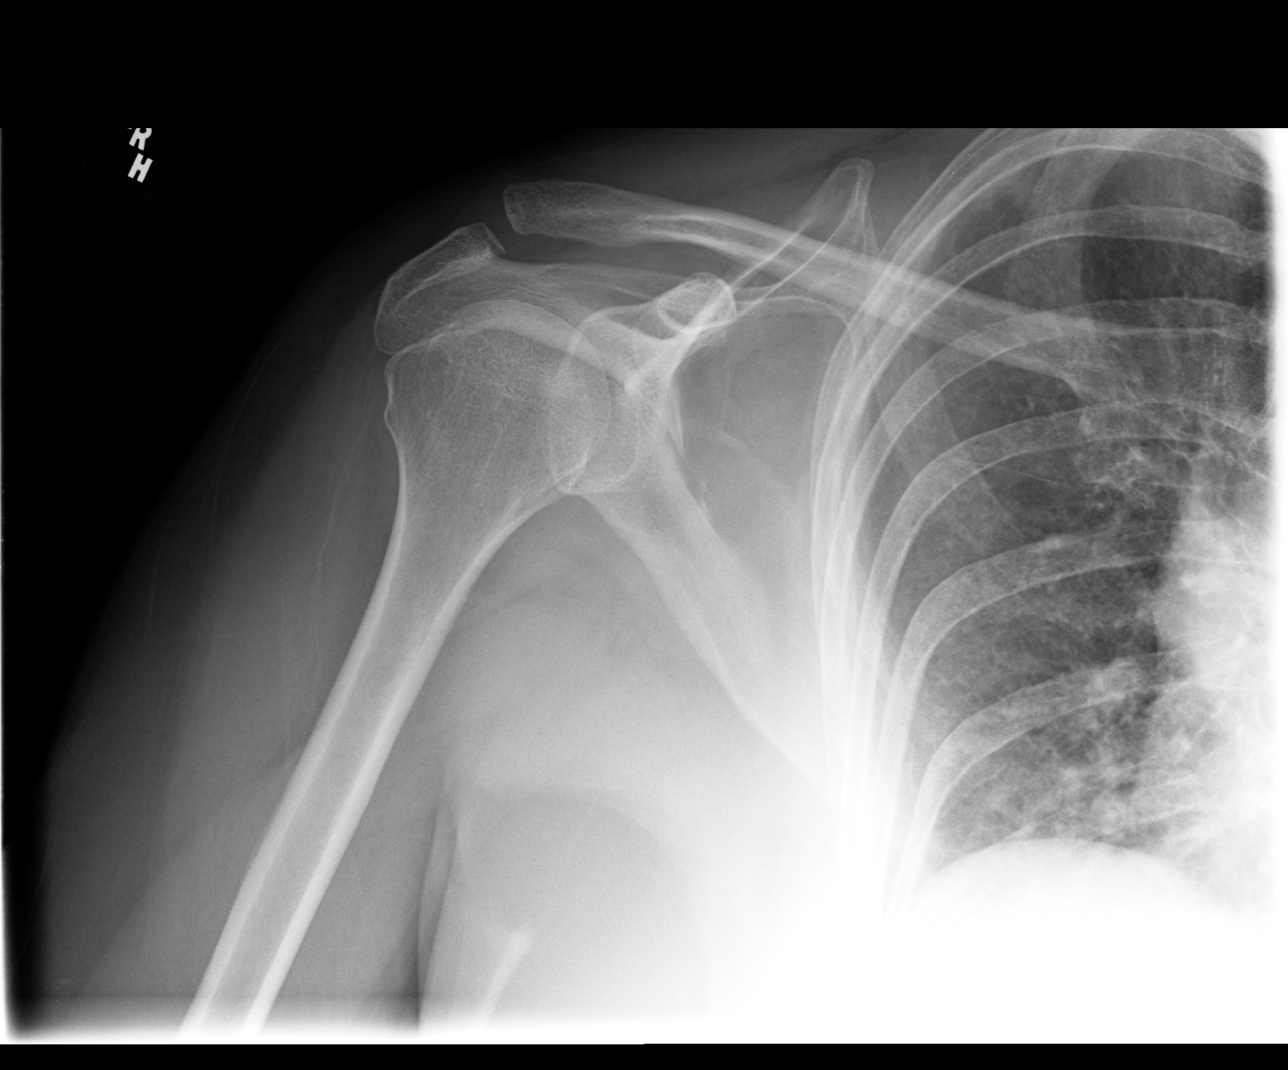

[view not recorded (3 of 3)]
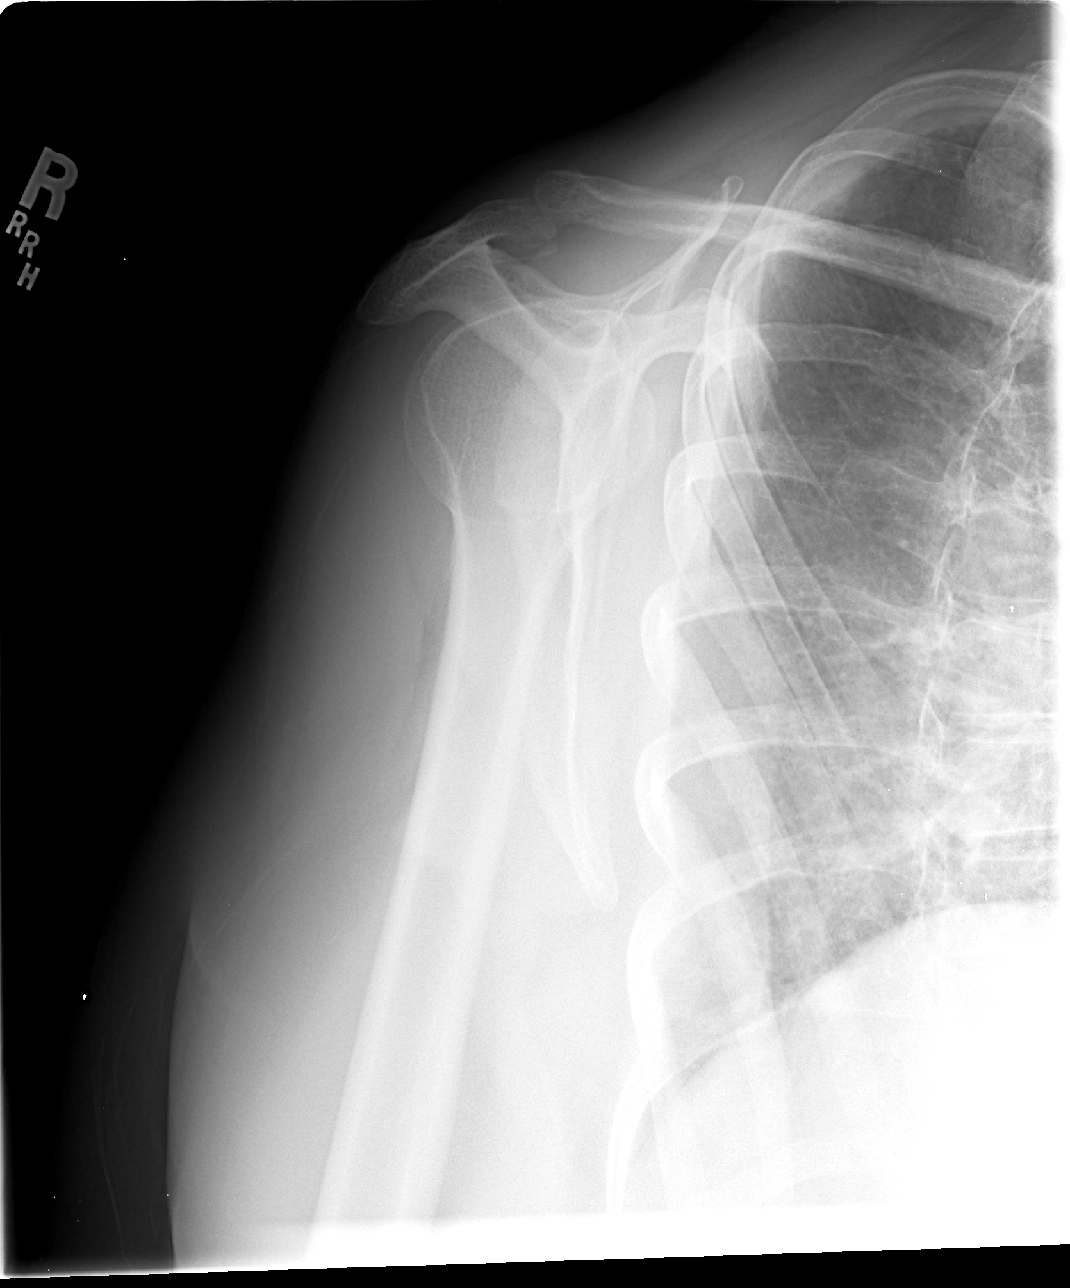

[3 of 3 positions shown; findings below may reference images not displayed]

FINDINGS: Osseous demineralization.
Question minimal superior subluxation of distal right clavicle at
AC joint.
No significant AC joint widening identified.
No acute fracture, dislocation, or bone destruction.
Visualized right ribs intact.
IMPRESSION: No acute glenohumeral abnormalities.
Question mild subluxation of right clavicle at right AC joint,
appears different in configuration versus remote chest radiograph.
AC joint injury not excluded; recommend clinical correlation.

## 2012-09-12 ENCOUNTER — Encounter (INDEPENDENT_AMBULATORY_CARE_PROVIDER_SITE_OTHER): Payer: Self-pay | Admitting: *Deleted

## 2012-09-16 ENCOUNTER — Encounter: Payer: Self-pay | Admitting: Oncology

## 2012-09-21 ENCOUNTER — Encounter (INDEPENDENT_AMBULATORY_CARE_PROVIDER_SITE_OTHER): Payer: Self-pay | Admitting: Internal Medicine

## 2012-09-21 ENCOUNTER — Ambulatory Visit (INDEPENDENT_AMBULATORY_CARE_PROVIDER_SITE_OTHER): Payer: Medicare Other | Admitting: Internal Medicine

## 2012-09-21 VITALS — BP 126/82 | HR 60 | Temp 98.1°F | Ht 67.5 in | Wt 251.2 lb

## 2012-09-21 DIAGNOSIS — K6389 Other specified diseases of intestine: Secondary | ICD-10-CM

## 2012-09-21 DIAGNOSIS — R933 Abnormal findings on diagnostic imaging of other parts of digestive tract: Secondary | ICD-10-CM

## 2012-09-21 DIAGNOSIS — C189 Malignant neoplasm of colon, unspecified: Secondary | ICD-10-CM

## 2012-09-21 NOTE — Progress Notes (Signed)
Subjective:     Patient ID: [name], female   DOB: 06-01-1950, 62 y.o.   MRN: [id number]  HPI Here today for f/u.  In October she underwent a CT abdomen/pelvis with CM which revealed   . No evidence of bowel obstruction. Focal eccentric wall thickening along the lateral aspect of the  cecum. Colonoscopy is suggested to exclude a primary colonic. In January of this year she underwent a colonoscopy which was incomplete.  In January of the year she underwent a Barium Enema for an incomplete colonoscopy. She was suppose to have repeat CT in 3 months but did not follow thru.  In 01/05/2011  : Patient is a 62 year old white female with a long-standing history of idiopathic gastrointestinal bleeding and anemia of chronic disease who was recently found on Givens capsule study as well CT scan of the abdomen pelvis to have a small bowel neoplasm in the mid portion of the small bowel tract.  She underwent partial small bowel resection.  History of factor V Leiden deficiency.  She tells me she is doing good.  There has been no weight loss. Appetite is good. No abdominal . No melena or bright red rectal bleeding No abdominal swelling.  There has been no rectal bleeding.   CBC    Component Value Date/Time   WBC 6.2 07/18/2012 0845   RBC 4.96 07/18/2012 0845   RBC 3.81* 06/07/2009 1230   HGB 14.5 07/18/2012 0845   HCT 44.8 07/18/2012 0845   PLT 265 07/18/2012 0845   MCV 90.3 07/18/2012 0845   MCH 29.2 07/18/2012 0845   MCHC 32.4 07/18/2012 0845   RDW 13.5 07/18/2012 0845   LYMPHSABS 2.5 07/18/2012 0845   MONOABS 0.6 07/18/2012 0845   EOSABS 0.1 07/18/2012 0845   BASOSABS 0.0 07/18/2012 0845      02/25/2012 Colonoscopy:   Impression:  Incomplete exam to hepatic flexure on account of looping and sigmoid colon which could not be reduced.  Scattered diverticula at sigmoid colon.  External hemorrhoids.  Recommendations:  Will proceed with barium enema today.  Patient will resume warfarin at usual dose.   I would be contacting patient with results of barium enema.  02/25/2012 Barium Enema:  IMPRESSION:  1. Challenging examination demonstrating no definite cecal mass.  There was a mild irregularity of the lateral wall of the cecum just  below the level of the ileocecal valve noted on only one image.  This is of uncertain etiology, but the possibility of a tiny  mucosal based lesion in this area is not excluded. This was  discussed with Dr. Karilyn Cota on 02/25/2012 at 04:45 p.m. Attention  on follow-up imaging is recommended.   11/23/11 CT abdomen/pelvis with CM:  IMPRESSION:  Prior small bowel resection with anastomoses in the left mid  abdomen. No evidence of metastatic disease in the abdomen/pelvis.   . No evidence of bowel obstruction.  Focal eccentric wall thickening along the lateral aspect of the  cecum. Colonoscopy is suggested to exclude a primary colonic  neoplasm.       Review of Systems Current Outpatient Prescriptions  Medication Sig Dispense Refill  . acetaminophen (TYLENOL) 325 MG tablet Take 650 mg by mouth every 6 (six) hours as needed. Pain      . diphenhydrAMINE (SOMINEX) 25 MG tablet Take 25 mg by mouth at bedtime as needed for sleep.      . iron polysaccharides (FERREX 150) 150 MG capsule Take 1 capsule (150 mg total) by mouth daily. Take  1 2x per week for maintenance  30 capsule  1  . lansoprazole (PREVACID) 15 MG capsule Take 15 mg by mouth daily.       . metoprolol tartrate (LOPRESSOR) 25 MG tablet Take 1 tablet (25 mg total) by mouth 2 (two) times daily.  60 tablet  12  . warfarin (COUMADIN) 2 MG tablet Taking  6mg  , 6mg , 6mg   And then 7mg  Changes every 3 days      . warfarin (COUMADIN) 5 MG tablet Take 1 tablet (5 mg total) by mouth daily. Taking 5mg , 5mg , 6mg , 6mg , etc.  30 tablet  2  . [DISCONTINUED] diphenhydrAMINE (BENADRYL) 25 mg capsule Take 25 mg by mouth every 6 (six) hours as needed. Itching or allergies       No current facility-administered  medications for this visit.   Past Medical History  Diagnosis Date  . Vitamin B12 deficiency     vit b12 1000 mcg monthly  . Cellulitis of left leg 2006  . Ulcer 05/2009    esophageal  . Clotting disorder     heterozygosity from factor v leiden  . Pernicious anemia 07/10/2010  . DVT (deep venous thrombosis) 07/10/2010    on coumadin  . Factor V Leiden   . Anxiety   . 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 Allergic urticaria 01/04/2011    Rash from tape.  Marland Kitchen GERD (gastroesophageal reflux disease)   . Ventricular tachycardia 03/26/11   Past Surgical History  Procedure Laterality Date  . Abdominal hysterectomy  1989  . Balloon dilation  12/11/2010    Procedure: BALLOON DILATION;  Surgeon: Malissa Hippo, MD;  Location: [facility];  Service: Endoscopy;  Laterality: N/A;  . Laparotomy  01/05/2011    Procedure: EXPLORATORY LAPAROTOMY;  Surgeon: Dalia Heading;  Location: [facility];  Service: General;  Laterality: N/A;  . Bowel resection  01/05/2011    Procedure: SMALL BOWEL RESECTION;  Surgeon: Dalia Heading;  Location: [facility];  Service: General;;  Partial Small Bowel Resection  . Givens capsule study  01/02/2011    Procedure: GIVENS CAPSULE STUDY;  Surgeon: Malissa Hippo, MD;  Location: [facility];  Service: Endoscopy;  Laterality: N/A;  . Colonoscopy  02/25/2012    Procedure: COLONOSCOPY;  Surgeon: Malissa Hippo, MD;  [facility]: [facility];  Service: Endoscopy;  Laterality: N/A;  1200   Allergies  Allergen Reactions  . Cephalexin Swelling  . Dexlansoprazole Swelling  . Latex Itching  . Other Swelling    Patient states that Pecans cause her mouth to swell.  Marland Kitchen Penicillins Swelling  . Tape Itching  . Enoxaparin Sodium Itching, Swelling and Palpitations  . Nylon Rash  . Omeprazole Itching and Rash  . Sulfonamide Derivatives Rash        Objective:   Physical Exam  Filed Vitals:   09/21/12 1102  BP: 126/82  Pulse: 60  Temp: 98.1 F (36.7 C)  Height: 5'  7.5" (1.715 m)  Weight: 251 lb 3.2 oz (113.944 kg)    Alert and oriented. Skin warm and dry. Oral mucosa is moist.   . Sclera anicteric, conjunctivae is pink. Thyroid not enlarged. No cervical lymphadenopathy. Lungs clear. Heart regular rate and rhythm.  Abdomen is soft. Bowel sounds are positive. No hepatomegaly. No abdominal masses felt. No tenderness.  No edema to lower extremities.       Assessment:      Hx of small bowel cancer and underwent  a resection in 2012. Abnormal Barium Enema. Plan:    CT abdomen/pelvis with CM, creatinine. Further recommendations to follow

## 2012-09-28 ENCOUNTER — Ambulatory Visit (HOSPITAL_COMMUNITY): Payer: Medicare Other

## 2012-10-03 ENCOUNTER — Other Ambulatory Visit (HOSPITAL_COMMUNITY): Payer: Self-pay | Admitting: Oncology

## 2012-10-03 DIAGNOSIS — D510 Vitamin B12 deficiency anemia due to intrinsic factor deficiency: Secondary | ICD-10-CM

## 2012-10-03 DIAGNOSIS — I82402 Acute embolism and thrombosis of unspecified deep veins of left lower extremity: Secondary | ICD-10-CM

## 2012-10-03 DIAGNOSIS — D509 Iron deficiency anemia, unspecified: Secondary | ICD-10-CM

## 2012-10-03 MED ORDER — WARFARIN SODIUM 5 MG PO TABS: 5.0000 mg | ORAL_TABLET | Freq: Every day | ORAL | Status: DC

## 2012-10-18 ENCOUNTER — Ambulatory Visit: Payer: Self-pay | Admitting: Family Medicine

## 2012-10-18 ENCOUNTER — Telehealth: Payer: Self-pay | Admitting: *Deleted

## 2012-10-18 NOTE — Telephone Encounter (Signed)
Left message on home number Cell number not set up for voicemail

## 2012-10-20 LAB — PROTIME-INR

## 2012-11-28 ENCOUNTER — Telehealth (HOSPITAL_COMMUNITY): Payer: Self-pay | Admitting: *Deleted

## 2012-11-28 NOTE — Telephone Encounter (Signed)
Spoke with pt about PT/INR results. Instruct to continue same dose Coumadin 6/6/7, continue to check PT/INR weekly. Verbalized understanding.

## 2012-12-21 ENCOUNTER — Encounter: Payer: Self-pay | Admitting: Internal Medicine

## 2012-12-21 ENCOUNTER — Ambulatory Visit (INDEPENDENT_AMBULATORY_CARE_PROVIDER_SITE_OTHER): Payer: Medicare Other | Admitting: Internal Medicine

## 2012-12-21 VITALS — BP 144/79 | HR 51 | Ht 67.0 in | Wt 250.0 lb

## 2012-12-21 DIAGNOSIS — I471 Supraventricular tachycardia: Secondary | ICD-10-CM

## 2012-12-21 DIAGNOSIS — I498 Other specified cardiac arrhythmias: Secondary | ICD-10-CM

## 2012-12-21 DIAGNOSIS — R635 Abnormal weight gain: Secondary | ICD-10-CM

## 2012-12-21 NOTE — Patient Instructions (Addendum)
Your physician recommends that you schedule a follow-up appointment in: 1 year with Dr taylor You will receive a reminder letter two months in advance reminding you to call and schedule your appointment. If you don't receive this letter, please contact our office.   Your physician recommends that you continue on your current medications as directed. Please refer to the Current Medication list given to you today.  

## 2012-12-21 NOTE — Assessment & Plan Note (Signed)
We spent considerable time discussing strategies for weight loss. One option would be to consume a more green vegetables and discontinue warfarin switching to one of our newer novel agents. She is considering this option. Another strategy would be to increase her physical activity. We discussed how this might be accomplished. Ultimately the patient will have to due both to lose a considerable amount of weight. She admits to a more when she feels stressed. She has gained weight since stopping taking benzodiazepines.

## 2012-12-21 NOTE — Assessment & Plan Note (Signed)
Her symptoms are currently well-controlled. She will continue her low-dose beta blocker therapy.

## 2012-12-21 NOTE — Progress Notes (Signed)
HPI [name] returns today for followup. She is a pleasant middle aged woman with a h/o palpitations after chemotherapy, HTN and obesity. In the interim, she has done well except that she has gained weight - over 30 lbs in the past year, admitting to dietary indiscretion. She has not had any syncope. She has started to exercise but notes that she gets short of breath going up inclines, particularly now that she is gaining weight. She would like to lose 90 pounds. She is frustrated by her inability to eat greens because she is on warfarin. Allergies  Allergen Reactions  . Cephalexin Swelling  . Dexlansoprazole Swelling  . Latex Itching  . Other Swelling    Patient states that Pecans cause her mouth to swell.  Marland Kitchen Penicillins Swelling  . Tape Itching  . Enoxaparin Sodium Itching, Swelling and Palpitations  . Nylon Rash  . Omeprazole Itching and Rash  . Sulfonamide Derivatives Rash     Current Outpatient Prescriptions  Medication Sig Dispense Refill  . acetaminophen (TYLENOL) 325 MG tablet Take 650 mg by mouth every 6 (six) hours as needed. Pain      . diphenhydrAMINE (SOMINEX) 25 MG tablet Take 25 mg by mouth at bedtime as needed for sleep.      . iron polysaccharides (FERREX 150) 150 MG capsule Take 1 capsule (150 mg total) by mouth daily. Take 1 2x per week for maintenance  30 capsule  1  . lansoprazole (PREVACID) 15 MG capsule Take 15 mg by mouth daily.       . metoprolol tartrate (LOPRESSOR) 25 MG tablet Take 1 tablet (25 mg total) by mouth 2 (two) times daily.  60 tablet  12  . warfarin (COUMADIN) 2 MG tablet Taking  6mg  , 6mg , 6mg   And then 7mg  Changes every 3 days      . warfarin (COUMADIN) 5 MG tablet Take 1 tablet (5 mg total) by mouth daily. Taking 5mg , 5mg , 6mg , 6mg , etc.  30 tablet  4  . [DISCONTINUED] diphenhydrAMINE (BENADRYL) 25 mg capsule Take 25 mg by mouth every 6 (six) hours as needed. Itching or allergies       No current facility-administered medications for this  visit.     Past Medical History  Diagnosis Date  . Vitamin B12 deficiency     vit b12 1000 mcg monthly  . Cellulitis of left leg 2006  . Ulcer 05/2009    esophageal  . Clotting disorder     heterozygosity from factor v leiden  . Pernicious anemia 07/10/2010  . DVT (deep venous thrombosis) 07/10/2010    on coumadin  . Factor V Leiden   . Anxiety   . 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 Allergic urticaria 01/04/2011    Rash from tape.  Marland Kitchen GERD (gastroesophageal reflux disease)   . Ventricular tachycardia 03/26/11    ROS:   All systems reviewed and negative except as noted in the HPI.   Past Surgical History  Procedure Laterality Date  . Abdominal hysterectomy  1989  . Balloon dilation  12/11/2010    Procedure: BALLOON DILATION;  Surgeon: Malissa Hippo, MD;  [facility]: [facility];  Service: Endoscopy;  Laterality: N/A;  . Laparotomy  01/05/2011    Procedure: EXPLORATORY LAPAROTOMY;  Surgeon: Dalia Heading;  Location: [facility];  Service: General;  Laterality: N/A;  . Bowel resection  01/05/2011    Procedure: SMALL BOWEL RESECTION;  Surgeon: Dalia Heading;  Location: [facility];  Service: General;;  Partial Small Bowel Resection  . Givens capsule study  01/02/2011    Procedure: GIVENS CAPSULE STUDY;  Surgeon: Malissa Hippo, MD;  [facility];  Service: Endoscopy;  Laterality: N/A;  . Colonoscopy  02/25/2012    Procedure: COLONOSCOPY;  Surgeon: Malissa Hippo, MD;  [facility];  Service: Endoscopy;  Laterality: N/A;  1200     History reviewed. No pertinent family history.   History   Social History  . Marital Status: Married    Spouse Name: N/A    Number of Children: N/A  . Years of Education: N/A   Occupational History  . Not on file.   Social History Main Topics  . Smoking status: Never Smoker   . Smokeless tobacco: Never Used  . Alcohol Use: No  . Drug Use: No  . Sexual Activity: Not Currently   Other Topics Concern  . Not on  file   Social History Narrative  . No narrative on file     BP 144/79  Pulse 51  Ht 5\' 7"  (1.702 m)  Wt 250 lb (113.399 kg)  BMI 39.15 kg/m2  Physical Exam:  Well appearing middle aged obese woman, NAD HEENT: Unremarkable Neck:  6 cm JVD, no thyromegally Back:  No CVA tenderness Lungs:  Clear with no wheezes HEART:  Regular rate rhythm, no murmurs, no rubs, no clicks Abd:  soft, obese, positive bowel sounds, no organomegally, no rebound, no guarding Ext:  2 plus pulses, trace edema, but with venous insufficiency Skin:  No rashes no nodules Neuro:  CN II through XII intact, motor grossly intact  EKG NSR   Assess/Plan:

## 2012-12-22 LAB — POCT INR: INR: 1.6

## 2012-12-26 ENCOUNTER — Telehealth (HOSPITAL_COMMUNITY): Payer: Self-pay

## 2012-12-26 NOTE — Telephone Encounter (Signed)
Per patient, no missed doses of coumadin but did eat a salad (house salad size) day before INR was done.  Taking coumadin 6 mg, 6 mg 7 mg, 6 mg, 6 mg, 7 mg etc.

## 2013-01-16 ENCOUNTER — Other Ambulatory Visit (HOSPITAL_COMMUNITY): Payer: Self-pay

## 2013-01-17 ENCOUNTER — Telehealth (HOSPITAL_COMMUNITY): Payer: Self-pay

## 2013-01-17 ENCOUNTER — Encounter (HOSPITAL_COMMUNITY): Payer: Medicare Other | Attending: Hematology and Oncology

## 2013-01-17 DIAGNOSIS — D6851 Activated protein C resistance: Secondary | ICD-10-CM

## 2013-01-17 DIAGNOSIS — D510 Vitamin B12 deficiency anemia due to intrinsic factor deficiency: Secondary | ICD-10-CM | POA: Diagnosis present

## 2013-01-17 DIAGNOSIS — C49A0 Gastrointestinal stromal tumor, unspecified site: Secondary | ICD-10-CM

## 2013-01-17 DIAGNOSIS — D509 Iron deficiency anemia, unspecified: Secondary | ICD-10-CM

## 2013-01-17 LAB — CBC WITH DIFFERENTIAL/PLATELET
Basophils Absolute: 0 10*3/uL (ref 0.0–0.1)
HCT: 42.5 % (ref 36.0–46.0)
Lymphocytes Relative: 36 % (ref 12–46)
Monocytes Absolute: 0.6 10*3/uL (ref 0.1–1.0)
Neutro Abs: 2.4 10*3/uL (ref 1.7–7.7)
Platelets: 300 10*3/uL (ref 150–400)
RDW: 13.4 % (ref 11.5–15.5)
WBC: 4.9 10*3/uL (ref 4.0–10.5)

## 2013-01-17 LAB — COMPREHENSIVE METABOLIC PANEL
ALT: 29 U/L (ref 0–35)
AST: 25 U/L (ref 0–37)
Albumin: 3.5 g/dL (ref 3.5–5.2)
CO2: 26 mEq/L (ref 19–32)
Calcium: 9.1 mg/dL (ref 8.4–10.5)
Chloride: 103 mEq/L (ref 96–112)
Creatinine, Ser: 0.75 mg/dL (ref 0.50–1.10)
GFR calc non Af Amer: 90 mL/min — ABNORMAL LOW (ref >90)
Glucose, Bld: 87 mg/dL (ref 70–99)
Potassium: 4.1 mEq/L (ref 3.5–5.1)
Sodium: 138 mEq/L (ref 135–145)
Total Bilirubin: 0.3 mg/dL (ref 0.3–1.2)

## 2013-01-17 LAB — PROTIME-INR: Prothrombin Time: 25 seconds — ABNORMAL HIGH (ref 11.6–15.2)

## 2013-01-17 LAB — FERRITIN: Ferritin: 49 ng/mL (ref 10–291)

## 2013-01-17 NOTE — Telephone Encounter (Signed)
Message left on patient's voicemail.  Call back confirmation requested.

## 2013-01-17 NOTE — Telephone Encounter (Signed)
Message copied by Evelena Leyden on Tue Jan 17, 2013  1:15 PM ------      Message from: Alla German A      Created: Tue Jan 17, 2013  1:09 PM       Please call to tell her to continue Warfarin with the same dosage.r.F       ------

## 2013-01-17 NOTE — Progress Notes (Signed)
Labs drawn today for pt,cbc/diff,cmp,ldh,dimer,ferr

## 2013-01-23 ENCOUNTER — Encounter (HOSPITAL_BASED_OUTPATIENT_CLINIC_OR_DEPARTMENT_OTHER): Payer: Medicare Other

## 2013-01-23 ENCOUNTER — Encounter (HOSPITAL_COMMUNITY): Payer: Self-pay

## 2013-01-23 VITALS — BP 117/72 | HR 59 | Temp 97.7°F | Resp 16 | Wt 254.3 lb

## 2013-01-23 DIAGNOSIS — D6851 Activated protein C resistance: Secondary | ICD-10-CM

## 2013-01-23 DIAGNOSIS — D509 Iron deficiency anemia, unspecified: Secondary | ICD-10-CM

## 2013-01-23 DIAGNOSIS — D6859 Other primary thrombophilia: Secondary | ICD-10-CM

## 2013-01-23 DIAGNOSIS — D510 Vitamin B12 deficiency anemia due to intrinsic factor deficiency: Secondary | ICD-10-CM

## 2013-01-23 DIAGNOSIS — C494 Malignant neoplasm of connective and soft tissue of abdomen: Secondary | ICD-10-CM

## 2013-01-23 DIAGNOSIS — C49A0 Gastrointestinal stromal tumor, unspecified site: Secondary | ICD-10-CM

## 2013-01-23 MED ORDER — CYANOCOBALAMIN 1000 MCG/ML IJ SOLN: 1000.0000 ug | INTRAMUSCULAR | Status: DC

## 2013-01-23 MED ORDER — TEMAZEPAM 15 MG PO CAPS: 15.0000 mg | ORAL_CAPSULE | Freq: Every evening | ORAL | Status: DC | PRN

## 2013-01-23 NOTE — Progress Notes (Signed)
[facility]  OFFICE PROGRESS NOTE  [name], Denise Hill, MD [address] [place] [address]  DIAGNOSIS: Pernicious anemia - Plan: CBC with Differential  GIST (gastrointestinal stromal tumor), malignant - Plan: Comprehensive metabolic panel, Lactate dehydrogenase, CT Abdomen Pelvis W Contrast  Iron deficiency anemia, unspecified - Plan: CBC with Differential, Ferritin, CANCELED: CEA  Factor V Leiden - Plan: D-dimer, quantitative, Protime-INR  Chief Complaint  Patient presents with  . GIST STAGE II resected    Long-term Coumadin therapy    CURRENT THERAPY: Watchful expectation to 2 intolerance of imatinib origin resection of stage II  GIST.  INTERVAL HISTORY: [name] 62 y.o. female returns for followup of iron deficiency in association with stage II GIST, no adjuvant treatment due to intolerance of imatinib, in the setting of thrombophilia requiring lifelong anticoagulation due to factor V Leiden mutation. She continues on warfarin and has had acceptable INR results. She denies any hemoptysis, epistaxis, melena, hematochezia, hematuria, or vaginal bleeding. Her primary problem has been insomnia awakening after 3 or 4 hours of sleep. She does not feel excessively tired and next day. Appetite is good with no nausea, vomiting, diarrhea, constipation, new onset of lower extremity swelling or redness, chest pain, PND, orthopnea, or palpitations. She denies any urinary frequency, incontinence, skin rash, joint pain, headache, or seizures.  MEDICAL HISTORY: Past Medical History  Diagnosis Date  . Vitamin B12 deficiency     vit b12 1000 mcg monthly  . Cellulitis of left leg 2006  . Ulcer 05/2009    esophageal  . Clotting disorder     heterozygosity from factor v leiden  . Pernicious anemia 07/10/2010  . DVT (deep venous thrombosis) 07/10/2010    on coumadin  . Factor V Leiden   . Anxiety   . Small bowel mass 01/03/2011    s/p surgery    . Allergic urticaria 01/04/2011    Rash from tape.  Marland Kitchen GERD (gastroesophageal reflux disease)   . Ventricular tachycardia 03/26/11    INTERIM HISTORY: has ANEMIA, IRON DEFICIENCY, CHRONIC; Reflux esophagitis; GI BLEEDING; WEIGHT GAIN; CONTUSION, ARM; DVT (deep venous thrombosis); Pernicious anemia; Factor V Leiden; Syncope; Acute blood loss anemia; Laceration of forehead; Chronic ulcer of left leg; Chronic anticoagulation; Abnormal CXR; Lip swelling; GIST (gastrointestinal stromal tumor), malignant; Allergic urticaria; SVT (supraventricular tachycardia); Hypotension, iatrogenic; Ventricular tachycardia; Bradycardia; Colonic cancer; and Abnormal CT scan, colon on her problem list.   #1.GIST, Stage IIB, unable to tolerate adjuvant Gleevec and not interested in trying any other drug.  Needs periodic CT scans in f/u #2.Factor V Leiden heterozygosity with multiple DVTs and superficial phlebitis of the left leg. #3. Iron deficiency, taking iron supplements 3 times per week.  ALLERGIES:  is allergic to cephalexin; dexlansoprazole; latex; other; penicillins; tape; enoxaparin sodium; nylon; omeprazole; and sulfonamide derivatives.  MEDICATIONS: has a current medication list which includes the following prescription(s): acetaminophen, cyanocobalamin, lansoprazole, metoprolol tartrate, warfarin, warfarin, diphenhydramine, iron polysaccharides, and temazepam.  SURGICAL HISTORY:  Past Surgical History  Procedure Laterality Date  . Abdominal hysterectomy  1989  . Balloon dilation  12/11/2010    Procedure: BALLOON DILATION;  Surgeon: Malissa Hippo, MD;  [facility];  Service: Endoscopy;  Laterality: N/A;  . Laparotomy  01/05/2011    Procedure: EXPLORATORY LAPAROTOMY;  Surgeon: Dalia Heading;  Location: [facility];  Service: General;  Laterality: N/A;  . Bowel resection  01/05/2011    Procedure: SMALL BOWEL RESECTION;  Surgeon: Dalia Heading;  Location: [facility];  Service: General;;  Partial Small  Bowel Resection  . Givens capsule study  01/02/2011    Procedure: GIVENS CAPSULE STUDY;  Surgeon: Malissa Hippo, MD;  [facility];  Service: Endoscopy;  Laterality: N/A;  . Colonoscopy  02/25/2012    Procedure: COLONOSCOPY;  Surgeon: Malissa Hippo, MD;  [facility];  Service: Endoscopy;  Laterality: N/A;  1200    FAMILY HISTORY: family history is not on file.  SOCIAL HISTORY:  reports that she has never smoked. She has never used smokeless tobacco. She reports that she does not drink alcohol or use illicit drugs.  REVIEW OF SYSTEMS:  Other than that discussed above is noncontributory.  PHYSICAL EXAMINATION: ECOG PERFORMANCE STATUS: 1 - Symptomatic but completely ambulatory  Blood pressure 117/72, pulse 59, temperature 97.7 F (36.5 C), temperature source Oral, resp. rate 16, weight 254 lb 4.8 oz (115.35 kg).  GENERAL:alert, no distress and comfortable SKIN: skin color, texture, turgor are normal, no rashes or significant lesions EYES: PERLA; Conjunctiva are pink and non-injected, sclera clear OROPHARYNX:no exudate, no erythema on lips, buccal mucosa, or tongue. NECK: supple, thyroid normal size, non-tender, without nodularity. No masses CHEST: Normal AP diameter with no breast masses. LYMPH:  no palpable lymphadenopathy in the cervical, axillary or inguinal LUNGS: clear to auscultation and percussion with normal breathing effort HEART: regular rate & rhythm and no murmurs. P2 is now audible at the apex. ABDOMEN:abdomen soft, non-tender and normal bowel sounds MUSCULOSKELETAL:no cyanosis of digits and no clubbing. Range of motion normal.  NEURO: alert & oriented x 3 with fluent speech, no focal motor/sensory deficits   LABORATORY DATA: Infusion on 01/17/2013  Component Date Value Range Status  . WBC 01/17/2013 4.9  4.0 - 10.5 K/uL Final  . RBC 01/17/2013 4.71  3.87 - 5.11 MIL/uL Final  . Hemoglobin 01/17/2013 13.7  12.0 - 15.0 g/dL Final  . HCT  95/62/1308 42.5  36.0 - 46.0 % Final  . MCV 01/17/2013 90.2  78.0 - 100.0 fL Final  . MCH 01/17/2013 29.1  26.0 - 34.0 pg Final  . MCHC 01/17/2013 32.2  30.0 - 36.0 g/dL Final  . RDW 65/78/4696 13.4  11.5 - 15.5 % Final  . Platelets 01/17/2013 300  150 - 400 K/uL Final  . Neutrophils Relative % 01/17/2013 48  43 - 77 % Final  . Neutro Abs 01/17/2013 2.4  1.7 - 7.7 K/uL Final  . Lymphocytes Relative 01/17/2013 36  12 - 46 % Final  . Lymphs Abs 01/17/2013 1.8  0.7 - 4.0 K/uL Final  . Monocytes Relative 01/17/2013 13* 3 - 12 % Final  . Monocytes Absolute 01/17/2013 0.6  0.1 - 1.0 K/uL Final  . Eosinophils Relative 01/17/2013 3  0 - 5 % Final  . Eosinophils Absolute 01/17/2013 0.1  0.0 - 0.7 K/uL Final  . Basophils Relative 01/17/2013 0  0 - 1 % Final  . Basophils Absolute 01/17/2013 0.0  0.0 - 0.1 K/uL Final  . Sodium 01/17/2013 138  135 - 145 mEq/L Final  . Potassium 01/17/2013 4.1  3.5 - 5.1 mEq/L Final  . Chloride 01/17/2013 103  96 - 112 mEq/L Final  . CO2 01/17/2013 26  19 - 32 mEq/L Final  . Glucose, Bld 01/17/2013 87  70 - 99 mg/dL Final  . BUN 29/52/8413 13  6 - 23 mg/dL Final  . Creatinine, Ser 01/17/2013 0.75  0.50 - 1.10 mg/dL Final  .  Calcium 01/17/2013 9.1  8.4 - 10.5 mg/dL Final  . Total Protein 01/17/2013 7.4  6.0 - 8.3 g/dL Final  . Albumin 11/91/4782 3.5  3.5 - 5.2 g/dL Final  . AST 95/62/1308 25  0 - 37 U/L Final  . ALT 01/17/2013 29  0 - 35 U/L Final  . Alkaline Phosphatase 01/17/2013 97  39 - 117 U/L Final  . Total Bilirubin 01/17/2013 0.3  0.3 - 1.2 mg/dL Final  . GFR calc non Af Amer 01/17/2013 90* >90 mL/min Final  . GFR calc Af Amer 01/17/2013 >90  >90 mL/min Final   Comment: (NOTE)                          The eGFR has been calculated using the CKD EPI equation.                          This calculation has not been validated in all clinical situations.                          eGFR's persistently <90 mL/min signify possible Chronic Kidney                           Disease.  Marland Kitchen LDH 01/17/2013 197  94 - 250 U/L Final  . D-Dimer, Quant 01/17/2013 0.89* 0.00 - 0.48 ug/mL-FEU Final   Comment:                                 AT THE INHOUSE ESTABLISHED CUTOFF                          VALUE OF 0.48 ug/mL FEU,                          THIS ASSAY HAS BEEN DOCUMENTED                          IN THE LITERATURE TO HAVE                          A SENSITIVITY AND NEGATIVE                          PREDICTIVE VALUE OF AT LEAST                          98 TO 99%.  THE TEST RESULT                          SHOULD BE CORRELATED WITH                          AN ASSESSMENT OF THE CLINICAL                          PROBABILITY OF DVT / VTE.  Marland Kitchen Ferritin 01/17/2013 49  10 - 291 ng/mL Final   Performed at [facility]  . Prothrombin Time 01/17/2013 25.0* 11.6 - 15.2 seconds Final  . INR  01/17/2013 2.36* 0.00 - 1.49 Final  Telephone on 12/26/2012  Component Date Value Range Status  . INR 12/01/2012 3   Final  . INR 12/12/2012 2.7   Final  . INR 12/22/2012 1.6   Final    PATHOLOGY: No new pathology.  Urinalysis    Component Value Date/Time   COLORURINE YELLOW 04/03/2011 0259   APPEARANCEUR HAZY* 04/03/2011 0259   LABSPEC 1.015 04/03/2011 0259   PHURINE 8.5* 04/03/2011 0259   GLUCOSEU NEGATIVE 04/03/2011 0259   HGBUR NEGATIVE 04/03/2011 0259   BILIRUBINUR NEGATIVE 04/03/2011 0259   KETONESUR NEGATIVE 04/03/2011 0259   PROTEINUR NEGATIVE 04/03/2011 0259   UROBILINOGEN 0.2 04/03/2011 0259   NITRITE NEGATIVE 04/03/2011 0259   LEUKOCYTESUR NEGATIVE 04/03/2011 0259    RADIOGRAPHIC STUDIES: Repeat CT scan of the abdomen and pelvis will be scheduled for this week.  ASSESSMENT:  #1. Stage II GIST status post resection in December of 2012, cardiac dysrhythmia adverse effects to imatinib with lack of desire to try an alternative agent adjuvantly, for repeat CT scan to assess status of disease. #2. Thrombophilia presenting as factor V Leiden mutation, on life long warfarin, INR  therapeutic. #3. Iron deficiency with last ferritin of 49.   PLAN:  #1. Temazepam 15 mg at bedtime for insomnia and continue iron 3 times per week. #2. CT scan abdomen and pelvis with contrast to assess status of disease. #3. Followup in 6 months with lab tests and examination. Patient does self INR determinations at home and reports results to determine adjustments in warfarin dose.   All questions were answered. The patient knows to call the clinic with any problems, questions or concerns. We can certainly see the patient much sooner if necessary.   I spent 25 minutes counseling the patient face to face. The total time spent in the appointment was 30 minutes.    Maurilio Lovely, MD 01/23/2013 11:59 AM

## 2013-01-23 NOTE — Patient Instructions (Signed)
[facility] Discharge Instructions  RECOMMENDATIONS MADE BY THE CONSULTANT AND ANY TEST RESULTS WILL BE SENT TO YOUR REFERRING PHYSICIAN.  EXAM FINDINGS BY THE PHYSICIAN TODAY AND SIGNS OR SYMPTOMS TO REPORT TO CLINIC OR PRIMARY PHYSICIAN: Exam and findings as discussed by Dr. Zigmund Daniel.  Will do CT scans of your abdomen and pelvis.  MEDICATIONS PRESCRIBED:  Refills for B12 injections given  Temazpam for sleep.  INSTRUCTIONS/FOLLOW-UP: Follow-up in 6 months.  Thank you for choosing [facility] to provide your oncology and hematology care.  To afford each patient quality time with our providers, please arrive at least 15 minutes before your scheduled appointment time.  With your help, our goal is to use those 15 minutes to complete the necessary work-up to ensure our physicians have the information they need to help with your evaluation and healthcare recommendations.    Effective January 1st, 2014, we ask that you re-schedule your appointment with our physicians should you arrive 10 or more minutes late for your appointment.  We strive to give you quality time with our providers, and arriving late affects you and other patients whose appointments are after yours.    Again, thank you for choosing [facility].  Our hope is that these requests will decrease the amount of time that you wait before being seen by our physicians.       _____________________________________________________________  Should you have questions after your visit to [facility], please contact our office at [phone] between the hours of 8:30 a.m. and 5:00 p.m.  Voicemails left after 4:30 p.m. will not be returned until the following business day.  For prescription refill requests, have your pharmacy contact our office with your prescription refill request.

## 2013-01-25 ENCOUNTER — Ambulatory Visit (HOSPITAL_COMMUNITY)
Admission: RE | Admit: 2013-01-25 | Discharge: 2013-01-25 | Disposition: A | Discharge status: Home / Self Care | Payer: Medicare Other | Source: Ambulatory Visit | Attending: Hematology and Oncology | Admitting: Hematology and Oncology

## 2013-01-25 ENCOUNTER — Other Ambulatory Visit (HOSPITAL_COMMUNITY): Payer: Medicare Other

## 2013-01-25 DIAGNOSIS — D3500 Benign neoplasm of unspecified adrenal gland: Secondary | ICD-10-CM | POA: Insufficient documentation

## 2013-01-25 DIAGNOSIS — I749 Embolism and thrombosis of unspecified artery: Secondary | ICD-10-CM | POA: Insufficient documentation

## 2013-01-25 DIAGNOSIS — C494 Malignant neoplasm of connective and soft tissue of abdomen: Secondary | ICD-10-CM | POA: Insufficient documentation

## 2013-01-25 DIAGNOSIS — C49A0 Gastrointestinal stromal tumor, unspecified site: Secondary | ICD-10-CM

## 2013-01-25 MED ORDER — IOHEXOL 300 MG/ML  SOLN
100.0000 mL | Freq: Once | INTRAMUSCULAR | Status: AC | PRN
  Administered 2013-01-25: 100 mL via INTRAVENOUS

## 2013-02-01 ENCOUNTER — Other Ambulatory Visit (HOSPITAL_COMMUNITY): Payer: Self-pay | Admitting: Oncology

## 2013-02-01 DIAGNOSIS — D510 Vitamin B12 deficiency anemia due to intrinsic factor deficiency: Secondary | ICD-10-CM

## 2013-02-01 MED ORDER — CYANOCOBALAMIN 1000 MCG/ML IJ SOLN: 1000.0000 ug | INTRAMUSCULAR | Status: DC

## 2013-02-24 LAB — POCT INR: INR: 2.5

## 2013-03-21 LAB — POCT INR: INR: 3.4

## 2013-03-24 ENCOUNTER — Telehealth (HOSPITAL_COMMUNITY): Payer: Self-pay

## 2013-03-24 ENCOUNTER — Other Ambulatory Visit (HOSPITAL_COMMUNITY): Payer: Self-pay | Admitting: Oncology

## 2013-03-24 DIAGNOSIS — D6851 Activated protein C resistance: Secondary | ICD-10-CM

## 2013-03-24 DIAGNOSIS — I82409 Acute embolism and thrombosis of unspecified deep veins of unspecified lower extremity: Secondary | ICD-10-CM

## 2013-03-24 NOTE — Telephone Encounter (Signed)
Patient notified and scheduled for 03/28/13.

## 2013-03-24 NOTE — Telephone Encounter (Signed)
Concerned that meter that she is using is not accurate.  Is checking INRs same time of day, no changes in diet, etc but notes that INRs are fluctuating a lot and is worried that her coumadin dosage is not what it should be based upon those readings.  Wants to know if she can start coming back to the clinic to get her INRs done?  Also want to know results of CT scans, has not heard anything.

## 2013-03-24 NOTE — Telephone Encounter (Signed)
Message copied by Mellissa Kohut on Fri Mar 24, 2013  9:59 AM ------      Message from: [facility]      Created: Fri Mar 24, 2013  9:44 AM       CT scan is stable.  Orders placed for INR.                  Concerned that meter that she is using is not accurate.  Is checking INRs same time of day, no changes in diet, etc but notes that INRs are fluctuating a lot and is worried that her coumadin dosage is not what it should be based upon those readings.  Wants to know if she can start coming back to the clinic to get her INRs done?  Also want to know results of CT scans, has not heard anything.                               ------

## 2013-03-28 ENCOUNTER — Encounter (HOSPITAL_COMMUNITY): Payer: Medicare Other | Attending: Hematology and Oncology

## 2013-03-28 DIAGNOSIS — I82409 Acute embolism and thrombosis of unspecified deep veins of unspecified lower extremity: Secondary | ICD-10-CM

## 2013-03-28 DIAGNOSIS — D6851 Activated protein C resistance: Secondary | ICD-10-CM

## 2013-03-28 DIAGNOSIS — D6859 Other primary thrombophilia: Secondary | ICD-10-CM | POA: Diagnosis present

## 2013-03-28 LAB — PROTIME-INR
INR: 2.39 — ABNORMAL HIGH (ref 0.00–1.49)
PROTHROMBIN TIME: 25.3 s — AB (ref 11.6–15.2)

## 2013-03-28 NOTE — Progress Notes (Signed)
[name] presented for labwork. Labs per MD order drawn via Peripheral Line 23 gauge needle inserted in left AC.  Good blood return present. Procedure without incident.  Needle removed intact. Patient tolerated procedure well.

## 2013-03-29 ENCOUNTER — Other Ambulatory Visit (HOSPITAL_COMMUNITY): Payer: Self-pay | Admitting: Oncology

## 2013-03-30 ENCOUNTER — Other Ambulatory Visit (HOSPITAL_COMMUNITY): Payer: Self-pay | Admitting: Oncology

## 2013-04-14 ENCOUNTER — Encounter (HOSPITAL_BASED_OUTPATIENT_CLINIC_OR_DEPARTMENT_OTHER): Payer: Medicare Other

## 2013-04-14 DIAGNOSIS — D6851 Activated protein C resistance: Secondary | ICD-10-CM

## 2013-04-14 DIAGNOSIS — D6859 Other primary thrombophilia: Secondary | ICD-10-CM

## 2013-04-14 DIAGNOSIS — I82409 Acute embolism and thrombosis of unspecified deep veins of unspecified lower extremity: Secondary | ICD-10-CM | POA: Diagnosis not present

## 2013-04-14 LAB — PROTIME-INR
INR: 2.1 — AB (ref 0.00–1.49)
Prothrombin Time: 22.9 seconds — ABNORMAL HIGH (ref 11.6–15.2)

## 2013-04-14 NOTE — Progress Notes (Signed)
Labs drawn today for pt 

## 2013-05-11 ENCOUNTER — Encounter (HOSPITAL_COMMUNITY): Payer: Medicare Other | Attending: Hematology and Oncology

## 2013-05-11 ENCOUNTER — Other Ambulatory Visit (HOSPITAL_COMMUNITY): Payer: Self-pay | Admitting: Oncology

## 2013-05-11 DIAGNOSIS — D6851 Activated protein C resistance: Secondary | ICD-10-CM

## 2013-05-11 DIAGNOSIS — I82409 Acute embolism and thrombosis of unspecified deep veins of unspecified lower extremity: Secondary | ICD-10-CM | POA: Insufficient documentation

## 2013-05-11 DIAGNOSIS — D509 Iron deficiency anemia, unspecified: Secondary | ICD-10-CM

## 2013-05-11 DIAGNOSIS — D6859 Other primary thrombophilia: Secondary | ICD-10-CM | POA: Insufficient documentation

## 2013-05-11 LAB — PROTIME-INR
INR: 2.36 — ABNORMAL HIGH (ref 0.00–1.49)
Prothrombin Time: 25 seconds — ABNORMAL HIGH (ref 11.6–15.2)

## 2013-05-11 MED ORDER — WARFARIN SODIUM 2 MG PO TABS: 2.0000 mg | ORAL_TABLET | Freq: Every day | ORAL | Status: DC

## 2013-05-11 NOTE — Progress Notes (Signed)
Labs drawn today for pt 

## 2013-05-11 NOTE — Progress Notes (Signed)
Patient notified of current PT, same dose of coumadin and will return in 4 weeks for labs.

## 2013-05-12 ENCOUNTER — Other Ambulatory Visit (HOSPITAL_COMMUNITY): Payer: Medicare Other

## 2013-06-08 ENCOUNTER — Encounter (HOSPITAL_COMMUNITY): Payer: Medicare Other | Attending: Hematology and Oncology

## 2013-06-08 DIAGNOSIS — I82409 Acute embolism and thrombosis of unspecified deep veins of unspecified lower extremity: Secondary | ICD-10-CM | POA: Insufficient documentation

## 2013-06-08 DIAGNOSIS — D6859 Other primary thrombophilia: Secondary | ICD-10-CM | POA: Insufficient documentation

## 2013-06-08 DIAGNOSIS — D6851 Activated protein C resistance: Secondary | ICD-10-CM

## 2013-06-08 LAB — PROTIME-INR
INR: 1.98 — AB (ref 0.00–1.49)
Prothrombin Time: 21.9 seconds — ABNORMAL HIGH (ref 11.6–15.2)

## 2013-06-10 ENCOUNTER — Other Ambulatory Visit (HOSPITAL_COMMUNITY): Payer: Self-pay | Admitting: Oncology

## 2013-07-06 ENCOUNTER — Encounter (HOSPITAL_COMMUNITY): Payer: Medicare Other | Attending: Hematology and Oncology

## 2013-07-06 ENCOUNTER — Other Ambulatory Visit (HOSPITAL_COMMUNITY): Payer: Self-pay | Admitting: Oncology

## 2013-07-06 DIAGNOSIS — D6859 Other primary thrombophilia: Secondary | ICD-10-CM | POA: Diagnosis present

## 2013-07-06 DIAGNOSIS — D509 Iron deficiency anemia, unspecified: Secondary | ICD-10-CM | POA: Diagnosis present

## 2013-07-06 DIAGNOSIS — I82409 Acute embolism and thrombosis of unspecified deep veins of unspecified lower extremity: Secondary | ICD-10-CM | POA: Diagnosis present

## 2013-07-06 DIAGNOSIS — C494 Malignant neoplasm of connective and soft tissue of abdomen: Secondary | ICD-10-CM | POA: Diagnosis present

## 2013-07-06 DIAGNOSIS — D6851 Activated protein C resistance: Secondary | ICD-10-CM

## 2013-07-06 LAB — PROTIME-INR
INR: 1.68 — AB (ref 0.00–1.49)
Prothrombin Time: 19.3 seconds — ABNORMAL HIGH (ref 11.6–15.2)

## 2013-07-06 NOTE — Progress Notes (Signed)
LABS DRAWN FOR PT/INR 

## 2013-07-14 ENCOUNTER — Other Ambulatory Visit (HOSPITAL_COMMUNITY): Payer: Self-pay | Admitting: Oncology

## 2013-07-14 ENCOUNTER — Encounter (HOSPITAL_BASED_OUTPATIENT_CLINIC_OR_DEPARTMENT_OTHER): Payer: Medicare Other

## 2013-07-14 DIAGNOSIS — I82409 Acute embolism and thrombosis of unspecified deep veins of unspecified lower extremity: Secondary | ICD-10-CM | POA: Diagnosis not present

## 2013-07-14 DIAGNOSIS — C49A0 Gastrointestinal stromal tumor, unspecified site: Secondary | ICD-10-CM

## 2013-07-14 DIAGNOSIS — D6851 Activated protein C resistance: Secondary | ICD-10-CM

## 2013-07-14 DIAGNOSIS — D6859 Other primary thrombophilia: Secondary | ICD-10-CM

## 2013-07-14 LAB — CBC WITH DIFFERENTIAL/PLATELET
Basophils Absolute: 0 10*3/uL (ref 0.0–0.1)
Basophils Relative: 0 % (ref 0–1)
EOS ABS: 0.1 10*3/uL (ref 0.0–0.7)
EOS PCT: 2 % (ref 0–5)
HCT: 42 % (ref 36.0–46.0)
HEMOGLOBIN: 14 g/dL (ref 12.0–15.0)
LYMPHS ABS: 2 10*3/uL (ref 0.7–4.0)
Lymphocytes Relative: 40 % (ref 12–46)
MCH: 29.5 pg (ref 26.0–34.0)
MCHC: 33.3 g/dL (ref 30.0–36.0)
MCV: 88.6 fL (ref 78.0–100.0)
MONOS PCT: 9 % (ref 3–12)
Monocytes Absolute: 0.4 10*3/uL (ref 0.1–1.0)
NEUTROS PCT: 49 % (ref 43–77)
Neutro Abs: 2.4 10*3/uL (ref 1.7–7.7)
Platelets: 273 10*3/uL (ref 150–400)
RBC: 4.74 MIL/uL (ref 3.87–5.11)
RDW: 13.5 % (ref 11.5–15.5)
WBC: 5 10*3/uL (ref 4.0–10.5)

## 2013-07-14 LAB — COMPREHENSIVE METABOLIC PANEL
ALBUMIN: 3.4 g/dL — AB (ref 3.5–5.2)
ALT: 27 U/L (ref 0–35)
AST: 27 U/L (ref 0–37)
Alkaline Phosphatase: 83 U/L (ref 39–117)
BUN: 14 mg/dL (ref 6–23)
CALCIUM: 9 mg/dL (ref 8.4–10.5)
CO2: 22 mEq/L (ref 19–32)
CREATININE: 0.64 mg/dL (ref 0.50–1.10)
Chloride: 107 mEq/L (ref 96–112)
GFR calc non Af Amer: >90 mL/min (ref >90)
GLUCOSE: 137 mg/dL — AB (ref 70–99)
Potassium: 4.2 mEq/L (ref 3.7–5.3)
Sodium: 142 mEq/L (ref 137–147)
TOTAL PROTEIN: 7.4 g/dL (ref 6.0–8.3)
Total Bilirubin: 0.3 mg/dL (ref 0.3–1.2)

## 2013-07-14 LAB — PROTIME-INR
INR: 1.82 — ABNORMAL HIGH (ref 0.00–1.49)
Prothrombin Time: 20.5 seconds — ABNORMAL HIGH (ref 11.6–15.2)

## 2013-07-14 NOTE — Progress Notes (Signed)
LABS DRAWN FOR CHROMOGRANIN A, PT, CBCD, CEA, CMP.

## 2013-07-15 LAB — CEA

## 2013-07-17 ENCOUNTER — Other Ambulatory Visit (HOSPITAL_COMMUNITY): Payer: Medicare Other

## 2013-07-19 LAB — CHROMOGRANIN A: CHROMOGRANIN A: 5 ng/mL (ref 1.9–15.0)

## 2013-07-24 ENCOUNTER — Telehealth (HOSPITAL_COMMUNITY): Payer: Self-pay

## 2013-07-24 ENCOUNTER — Encounter (HOSPITAL_COMMUNITY): Payer: Self-pay

## 2013-07-24 ENCOUNTER — Encounter (HOSPITAL_COMMUNITY): Payer: Medicare Other

## 2013-07-24 ENCOUNTER — Encounter (HOSPITAL_BASED_OUTPATIENT_CLINIC_OR_DEPARTMENT_OTHER): Payer: Medicare Other

## 2013-07-24 VITALS — BP 130/75 | HR 62 | Temp 97.4°F | Resp 18 | Wt 255.3 lb

## 2013-07-24 DIAGNOSIS — D6851 Activated protein C resistance: Secondary | ICD-10-CM

## 2013-07-24 DIAGNOSIS — C494 Malignant neoplasm of connective and soft tissue of abdomen: Secondary | ICD-10-CM

## 2013-07-24 DIAGNOSIS — I82409 Acute embolism and thrombosis of unspecified deep veins of unspecified lower extremity: Secondary | ICD-10-CM

## 2013-07-24 DIAGNOSIS — D509 Iron deficiency anemia, unspecified: Secondary | ICD-10-CM

## 2013-07-24 DIAGNOSIS — C49A0 Gastrointestinal stromal tumor, unspecified site: Secondary | ICD-10-CM

## 2013-07-24 DIAGNOSIS — D6859 Other primary thrombophilia: Secondary | ICD-10-CM

## 2013-07-24 LAB — PROTIME-INR
INR: 2.55 — AB (ref 0.00–1.49)
PROTHROMBIN TIME: 27.4 s — AB (ref 11.6–15.2)

## 2013-07-24 NOTE — Patient Instructions (Signed)
[facility] Discharge Instructions  RECOMMENDATIONS MADE BY THE CONSULTANT AND ANY TEST RESULTS WILL BE SENT TO YOUR REFERRING PHYSICIAN.  EXAM FINDINGS BY THE PHYSICIAN TODAY AND SIGNS OR SYMPTOMS TO REPORT TO CLINIC OR PRIMARY PHYSICIAN: Exam and findings as discussed by Dr. Barnet Glasgow.  Will let you know if we need to make any changes in your coumadin dosage.    INSTRUCTIONS/FOLLOW-UP: Follow-up in 3 months   Thank you for choosing [facility] to provide your oncology and hematology care.  To afford each patient quality time with our providers, please arrive at least 15 minutes before your scheduled appointment time.  With your help, our goal is to use those 15 minutes to complete the necessary work-up to ensure our physicians have the information they need to help with your evaluation and healthcare recommendations.    Effective January 1st, 2014, we ask that you re-schedule your appointment with our physicians should you arrive 10 or more minutes late for your appointment.  We strive to give you quality time with our providers, and arriving late affects you and other patients whose appointments are after yours.    Again, thank you for choosing [facility].  Our hope is that these requests will decrease the amount of time that you wait before being seen by our physicians.       _____________________________________________________________  Should you have questions after your visit to [facility], please contact our office at [phone] between the hours of 8:30 a.m. and 4:30 p.m.  Voicemails left after 4:30 p.m. will not be returned until the following business day.  For prescription refill requests, have your pharmacy contact our office with your prescription refill request.    _______________________________________________________________  We hope that we have given you very good care.  You may receive a patient  satisfaction survey in the mail, please complete it and return it as soon as possible.  We value your feedback!

## 2013-07-24 NOTE — Telephone Encounter (Signed)
Patient instructed to continue coumadin 7 mg daily and to return for next PT/INR on 08/07/13.  Verbalizes understanding. Per patient - does not want to take xarelto.  Is very comfortable with coumadin and wants to remain on it.

## 2013-07-24 NOTE — Addendum Note (Signed)
Addended by: Mellissa Kohut on: 07/24/2013 03:31 PM   Modules accepted: Orders

## 2013-07-24 NOTE — Telephone Encounter (Signed)
Message copied by Mellissa Kohut on Mon Jul 24, 2013  3:17 PM ------      Message from: [place], [place]: Mon Jul 24, 2013 10:47 AM       Continue warfarin at 7mg  daily.  Repeat INR in 2 weeks. Thanks.  ------

## 2013-07-24 NOTE — Progress Notes (Signed)
LABS DRAWN FOR PT

## 2013-07-24 NOTE — Progress Notes (Signed)
[facility]  OFFICE PROGRESS NOTE  [name], [name] [facility] [id number]  DIAGNOSIS: GIST (gastrointestinal stromal tumor), malignant - Plan: CBC with Differential, Comprehensive metabolic panel, CEA, Protime-INR, Chromogranin A  Factor V Leiden  Iron deficiency anemia, unspecified  Chief Complaint  Patient presents with  . GIST  . Thrombophilia  . Iron deficiency    CURRENT THERAPY: Watchful expectation, status post resection of Gist, stage II, intolerant of imatinib.  INTERVAL HISTORY: [name] 63 y.o. female returns for followup of iron deficiency with stage II GI ST, no adjuvant treatment due to intolerance of imatinib, in the setting of thrombophilia requiring lifelong anticoagulation due to factor V Leiden mutation while taking warfarin.  Her INR was only 1.8 one week ago so her dosage of warfarin was changed to 7 mg daily. She cannot afford an alternative anticoagulant because she no longer has prescription coverage. Appetite is good with no epistaxis, melena, hematochezia, hematuria, lower extremity swelling or redness, chest pain, PND, orthopnea, palpitations, or easy bruising.  MEDICAL HISTORY: Past Medical History  Diagnosis Date  . Vitamin B12 deficiency     vit b12 1000 mcg monthly  . Cellulitis of left leg 2006  . Ulcer 05/2009    esophageal  . Clotting disorder     heterozygosity from factor v leiden  . Pernicious anemia 07/10/2010  . DVT (deep venous thrombosis) 07/10/2010    on coumadin  . Factor V Leiden   . Anxiety   . 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 Allergic urticaria 01/04/2011    Rash from tape.  Marland Kitchen GERD (gastroesophageal reflux disease)   . Ventricular tachycardia 03/26/11    INTERIM HISTORY: has ANEMIA, IRON DEFICIENCY, CHRONIC; Reflux esophagitis; GI BLEEDING; WEIGHT GAIN; CONTUSION, ARM; DVT (deep venous thrombosis); Pernicious anemia; Factor V Leiden; Syncope; Acute  blood loss anemia; Laceration of forehead; Chronic ulcer of left leg; Chronic anticoagulation; Abnormal CXR; Lip swelling; GIST (gastrointestinal stromal tumor), malignant; Allergic urticaria; SVT (supraventricular tachycardia); Hypotension, iatrogenic; Ventricular tachycardia; Bradycardia; Colonic cancer; and Abnormal CT scan, colon on her problem list.   #1.GIST, Stage IIB, unable to tolerate adjuvant Gleevec and not interested in trying any other drug.  Needs periodic CT scans in f/u  #2.Factor V Leiden heterozygosity with multiple DVTs and superficial phlebitis of the left leg.  #3. Iron deficiency, taking iron supplements 3 times per week  ALLERGIES:  is allergic to cephalexin; dexlansoprazole; latex; other; penicillins; tape; enoxaparin sodium; nylon; omeprazole; and sulfonamide derivatives.  MEDICATIONS: has a current medication list which includes the following prescription(s): acetaminophen, cyanocobalamin, iron polysaccharides, lansoprazole, metoprolol tartrate, warfarin, warfarin, warfarin, diphenhydramine, and temazepam.  SURGICAL HISTORY:  Past Surgical History  Procedure Laterality Date  . Abdominal hysterectomy  1989  . Balloon dilation  12/11/2010    Procedure: BALLOON DILATION;  Surgeon: Rogene Houston, MD;  Location: [facility];  Service: Endoscopy;  Laterality: N/A;  . Laparotomy  01/05/2011    Procedure: EXPLORATORY LAPAROTOMY;  Surgeon: Jamesetta So;  Location: [facility] OR[facility];  Service: General;  Laterality: N/A;  . Bowel resection  01/05/2011    Procedure: SMALL BOWEL RESECTION;  Surgeon: Jamesetta So;  Location: [facility];  Service: General;;  Partial Small Bowel Resection  . Givens capsule study  01/02/2011    Procedure: GIVENS CAPSULE STUDY;  Surgeon: Rogene Houston, MD;  Location: [facility];  Service: Endoscopy;  Laterality: N/A;  . Colonoscopy  02/25/2012    Procedure: COLONOSCOPY;  Surgeon: Rogene Houston, MD;  Location: [facility];  Service: Endoscopy;   Laterality: N/A;  1200    FAMILY HISTORY: family history is not on file.  SOCIAL HISTORY:  reports that she has never smoked. She has never used smokeless tobacco. She reports that she does not drink alcohol or use illicit drugs.  REVIEW OF SYSTEMS:  Other than that discussed above is noncontributory.  PHYSICAL EXAMINATION: ECOG PERFORMANCE STATUS: 1 - Symptomatic but completely ambulatory  Blood pressure 130/75, pulse 62, temperature 97.4 F (36.3 C), temperature source Oral, resp. rate 18, weight 255 lb 4.8 oz (115.803 kg).  GENERAL:alert, no distress and comfortable SKIN: skin color, texture, turgor are normal, no rashes or significant lesions. No ecchymoses. EYES: PERLA; Conjunctiva are pink and non-injected, sclera clear SINUSES: No redness or tenderness over maxillary or ethmoid sinuses OROPHARYNX:no exudate, no erythema on lips, buccal mucosa, or tongue. NECK: supple, thyroid normal size, non-tender, without nodularity. No masses CHEST: Normal AP diameter with no breast masses. LYMPH:  no palpable lymphadenopathy in the cervical, axillary or inguinal LUNGS: clear to auscultation and percussion with normal breathing effort HEART: regular rate & rhythm and no murmurs. ABDOMEN:abdomen soft, non-tender and normal bowel sounds MUSCULOSKELETAL:no cyanosis of digits and no clubbing. Range of motion normal.  NEURO: alert & oriented x 3 with fluent speech, no focal motor/sensory deficits   LABORATORY DATA: Office Visit on 07/24/2013  Component Date Value Ref Range Status  . Prothrombin Time 07/24/2013 27.4* 11.6 - 15.2 seconds Final  . INR 07/24/2013 2.55* 0.00 - 1.49 Final  Lab on 07/14/2013  Component Date Value Ref Range Status  . Prothrombin Time 07/14/2013 20.5* 11.6 - 15.2 seconds Final  . INR 07/14/2013 1.82* 0.00 - 1.49 Final  . WBC 07/14/2013 5.0  4.0 - 10.5 K/uL Final  . RBC 07/14/2013 4.74  3.87 - 5.11 MIL/uL Final  . Hemoglobin 07/14/2013 14.0  12.0 - 15.0 g/dL  Final  . HCT 07/14/2013 42.0  36.0 - 46.0 % Final  . MCV 07/14/2013 88.6  78.0 - 100.0 fL Final  . MCH 07/14/2013 29.5  26.0 - 34.0 pg Final  . MCHC 07/14/2013 33.3  30.0 - 36.0 g/dL Final  . RDW 07/14/2013 13.5  11.5 - 15.5 % Final  . Platelets 07/14/2013 273  150 - 400 K/uL Final  . Neutrophils Relative % 07/14/2013 49  43 - 77 % Final  . Neutro Abs 07/14/2013 2.4  1.7 - 7.7 K/uL Final  . Lymphocytes Relative 07/14/2013 40  12 - 46 % Final  . Lymphs Abs 07/14/2013 2.0  0.7 - 4.0 K/uL Final  . Monocytes Relative 07/14/2013 9  3 - 12 % Final  . Monocytes Absolute 07/14/2013 0.4  0.1 - 1.0 K/uL Final  . Eosinophils Relative 07/14/2013 2  0 - 5 % Final  . Eosinophils Absolute 07/14/2013 0.1  0.0 - 0.7 K/uL Final  . Basophils Relative 07/14/2013 0  0 - 1 % Final  . Basophils Absolute 07/14/2013 0.0  0.0 - 0.1 K/uL Final  . Sodium 07/14/2013 142  137 - 147 mEq/L Final  . Potassium 07/14/2013 4.2  3.7 - 5.3 mEq/L Final  . Chloride 07/14/2013 107  96 - 112 mEq/L Final  . CO2 07/14/2013 22  19 - 32 mEq/L Final  . Glucose, Bld 07/14/2013 137* 70 - 99 mg/dL Final  . BUN 07/14/2013 14  6 - 23 mg/dL Final  .  Creatinine, Ser 07/14/2013 0.64  0.50 - 1.10 mg/dL Final  . Calcium 07/14/2013 9.0  8.4 - 10.5 mg/dL Final  . Total Protein 07/14/2013 7.4  6.0 - 8.3 g/dL Final  . Albumin 07/14/2013 3.4* 3.5 - 5.2 g/dL Final  . AST 07/14/2013 27  0 - 37 U/L Final  . ALT 07/14/2013 27  0 - 35 U/L Final  . Alkaline Phosphatase 07/14/2013 83  39 - 117 U/L Final  . Total Bilirubin 07/14/2013 0.3  0.3 - 1.2 mg/dL Final  . GFR calc non Af Amer 07/14/2013 >90  >90 mL/min Final  . GFR calc Af Amer 07/14/2013 >90  >90 mL/min Final   Comment: (NOTE)                          The eGFR has been calculated using the CKD EPI equation.                          This calculation has not been validated in all clinical situations.                          eGFR's persistently <90 mL/min signify possible Chronic Kidney                           Disease.  . CEA 07/14/2013 <0.5  0.0 - 5.0 ng/mL Final   Performed at [facility]  . Chromogranin A 07/14/2013 5.0  1.9 - 15.0 ng/mL Final   Comment: (NOTE)                          This test was performed using a laboratory developed                          electrochemiluminescent method. Values obtained with different assay                          methods cannot be used interchangeably. Chromogranin A levels,                          regardless of value, should not be interpreted as absolute evidence of                          the presence or absence of disease.                          This test was developed and its performance characteristics have been                          determined by [employer] [place]. Performance characteristics refer to the analytical                          performance of the test.  Performed at [facility] on 07/06/2013  Component Date Value Ref Range Status  . Prothrombin Time 07/06/2013 19.3* 11.6 - 15.2 seconds Final  . INR 07/06/2013 1.68* 0.00 - 1.49 Final    PATHOLOGY: No new pathology.  Urinalysis    Component Value Date/Time   COLORURINE YELLOW 04/03/2011 0259   APPEARANCEUR HAZY* 04/03/2011 0259   LABSPEC 1.015 04/03/2011 0259   PHURINE 8.5* 04/03/2011 0259   GLUCOSEU NEGATIVE 04/03/2011 0259   HGBUR NEGATIVE 04/03/2011 0259   BILIRUBINUR NEGATIVE 04/03/2011 0259   KETONESUR NEGATIVE 04/03/2011 0259   PROTEINUR NEGATIVE 04/03/2011 0259   UROBILINOGEN 0.2 04/03/2011 0259   NITRITE NEGATIVE 04/03/2011 0259   LEUKOCYTESUR NEGATIVE 04/03/2011 0259    RADIOGRAPHIC STUDIES: No results found.  ASSESSMENT:  #1.#1. Stage II GIST status post resection in December of 2012, cardiac dysrhythmia adverse effects to imatinib with lack of desire to try an alternative agent adjuvantly, for repeat CT scan to assess status of disease.    #2. Thrombophilia presenting as factor V Leiden mutation, on life long warfarin, INR today is pending #3. Iron deficiency with last ferritin of 49.    PLAN:  #1. Warfarin 7 mg daily with repeat INR in 2 weeks. #2. Followup in 3 months with CBC and ferritin along with INR with future determinations depending upon today's results. #3. Plan repeating CT scans in December of 2015.   All questions were answered. The patient knows to call the clinic with any problems, questions or concerns. We can certainly see the patient much sooner if necessary.   I spent 25 minutes counseling the patient face to face. The total time spent in the appointment was 30 minutes.    Doroteo Bradford, MD 07/24/2013 10:48 AM  DISCLAIMER:  This note was dictated with voice recognition software.  Similar sounding words can inadvertently be transcribed inaccurately and may not be corrected upon review.

## 2013-08-07 ENCOUNTER — Encounter (HOSPITAL_COMMUNITY): Payer: Medicare Other | Attending: Hematology and Oncology

## 2013-08-07 DIAGNOSIS — D6851 Activated protein C resistance: Secondary | ICD-10-CM

## 2013-08-07 DIAGNOSIS — D6859 Other primary thrombophilia: Secondary | ICD-10-CM | POA: Insufficient documentation

## 2013-08-07 DIAGNOSIS — I82402 Acute embolism and thrombosis of unspecified deep veins of left lower extremity: Secondary | ICD-10-CM

## 2013-08-07 DIAGNOSIS — I82409 Acute embolism and thrombosis of unspecified deep veins of unspecified lower extremity: Secondary | ICD-10-CM | POA: Insufficient documentation

## 2013-08-07 LAB — PROTIME-INR
INR: 2.43 — ABNORMAL HIGH (ref 0.00–1.49)
PROTHROMBIN TIME: 26.4 s — AB (ref 11.6–15.2)

## 2013-08-07 MED ORDER — WARFARIN SODIUM 5 MG PO TABS: 5.0000 mg | ORAL_TABLET | Freq: Every day | ORAL | Status: DC

## 2013-08-07 NOTE — Progress Notes (Signed)
LABS DRAWN FOR PT

## 2013-09-04 ENCOUNTER — Encounter (HOSPITAL_COMMUNITY): Payer: Medicare Other | Attending: Hematology and Oncology

## 2013-09-04 DIAGNOSIS — I82409 Acute embolism and thrombosis of unspecified deep veins of unspecified lower extremity: Secondary | ICD-10-CM | POA: Insufficient documentation

## 2013-09-04 DIAGNOSIS — D6851 Activated protein C resistance: Secondary | ICD-10-CM

## 2013-09-04 DIAGNOSIS — D6859 Other primary thrombophilia: Secondary | ICD-10-CM | POA: Insufficient documentation

## 2013-09-04 LAB — PROTIME-INR
INR: 2.73 — ABNORMAL HIGH (ref 0.00–1.49)
Prothrombin Time: 28.9 seconds — ABNORMAL HIGH (ref 11.6–15.2)

## 2013-09-04 NOTE — Progress Notes (Signed)
[name]'s reason for visit today are for labs as scheduled per MD orders.  Venipuncture performed with a 23 gauge butterfly needle to L Antecubital.  [name] tolerated venipuncture well and without incident.  [name] states she is taking Coumadin 7mg  daily w/o issues, missed doses, abnormal bleeding, or complications.  No questions patient was discharged.

## 2013-09-06 ENCOUNTER — Telehealth (HOSPITAL_COMMUNITY): Payer: Self-pay | Admitting: *Deleted

## 2013-09-06 NOTE — Telephone Encounter (Signed)
Patient called re: PT INR.Marland Kitchen Discussed with Dr. Barnet Glasgow and will continue same dose and check in one month. appt's given to patient.

## 2013-09-27 ENCOUNTER — Ambulatory Visit: Payer: Self-pay | Admitting: Family

## 2013-10-01 IMAGING — US US EXTREM LOW VENOUS*L*
1 series · 14 of 24 positions shown · non-contrast
Comparison: 10/27/2004

CLINICAL DATA: Left leg swelling, rule out DVT

LEFT LOWER EXTREMITY VENOUS DUPLEX ULTRASOUND
TECHNIQUE: Gray-scale sonography with graded compression, as well
as color Doppler and duplex ultrasound, were performed to evaluate
the deep venous system of the lower extremity from the level of the
common femoral vein through the popliteal and proximal calf veins.
Spectral Doppler was utilized to evaluate flow at rest and with
distal augmentation maneuvers.

[Series 1: us extrem low venous*left* · 14 of 36 slices shown]
[im 1/36]
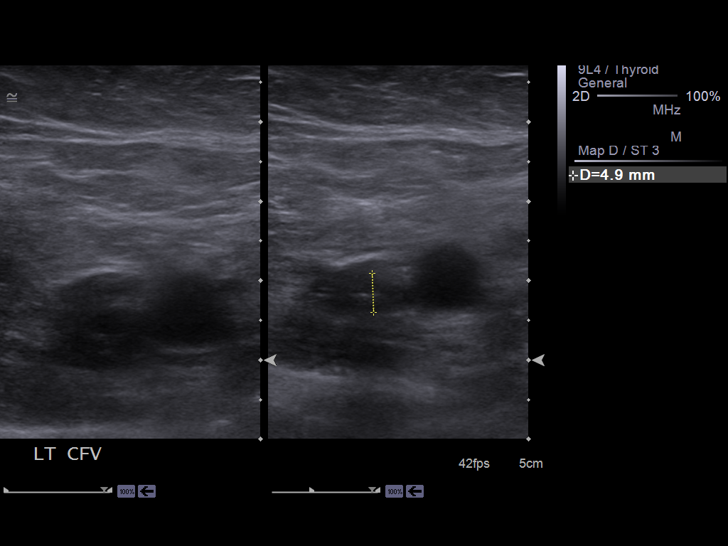
[im 4/36]
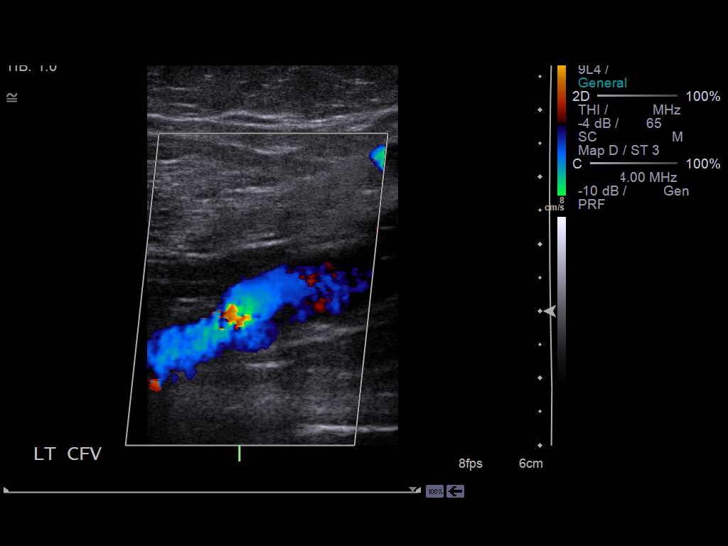
[im 7/36]
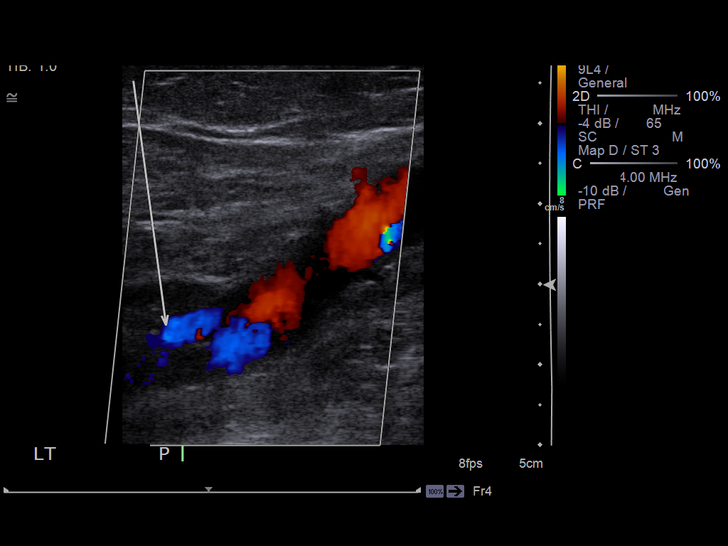
[im 10/36]
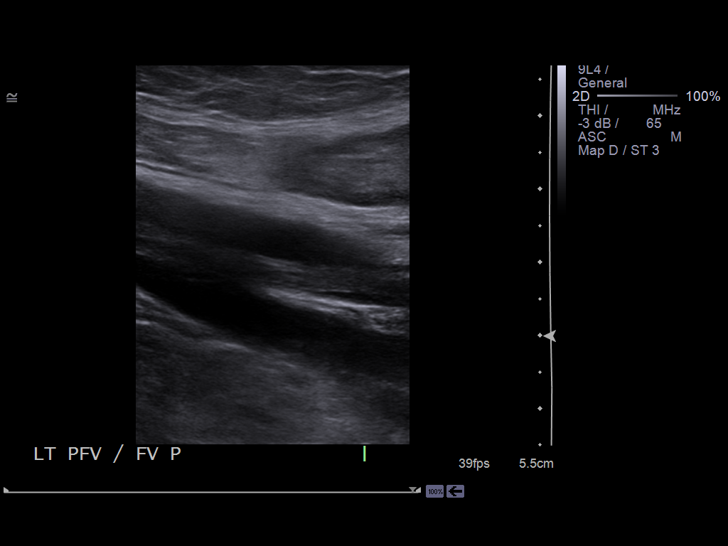
[im 11/36]
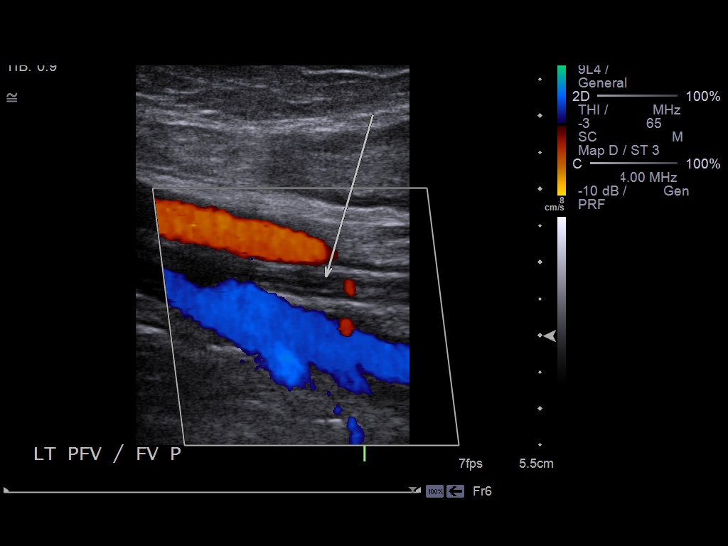
[im 14/36]
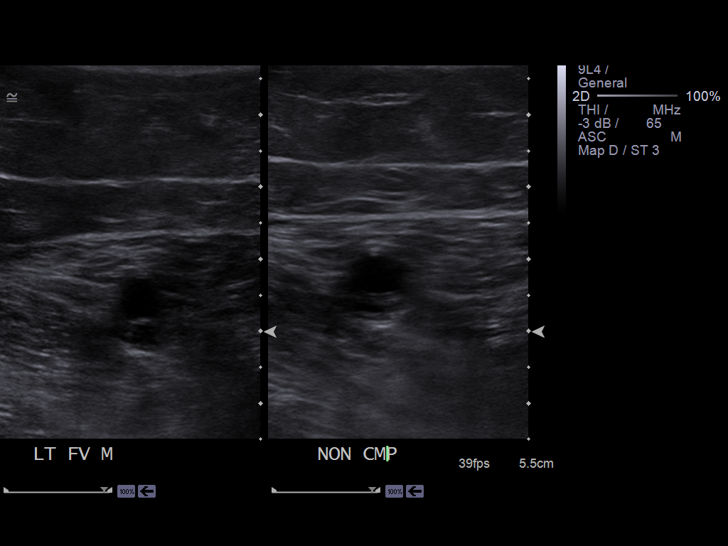
[im 17/36]
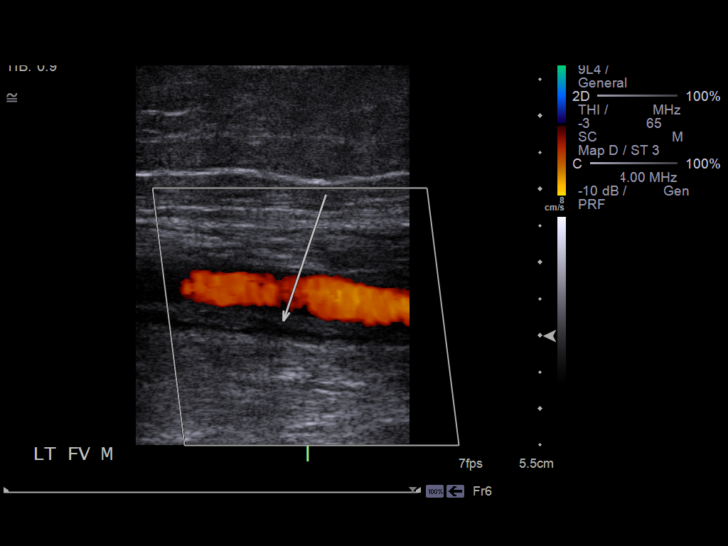
[im 19/36]
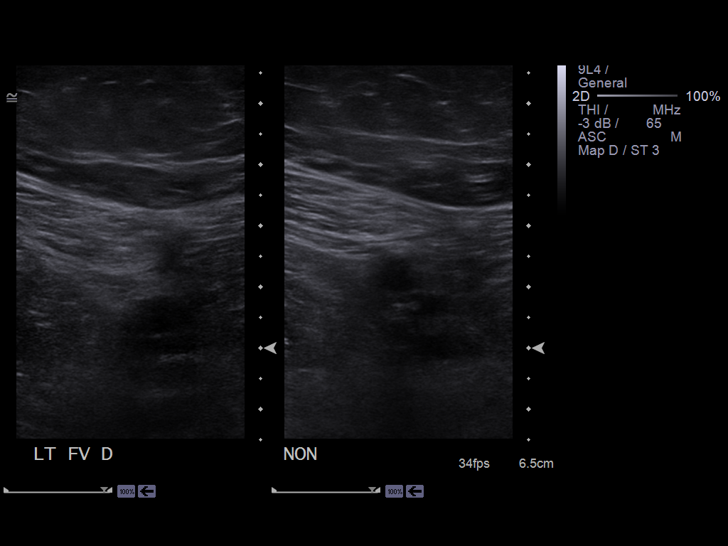
[im 22/36]
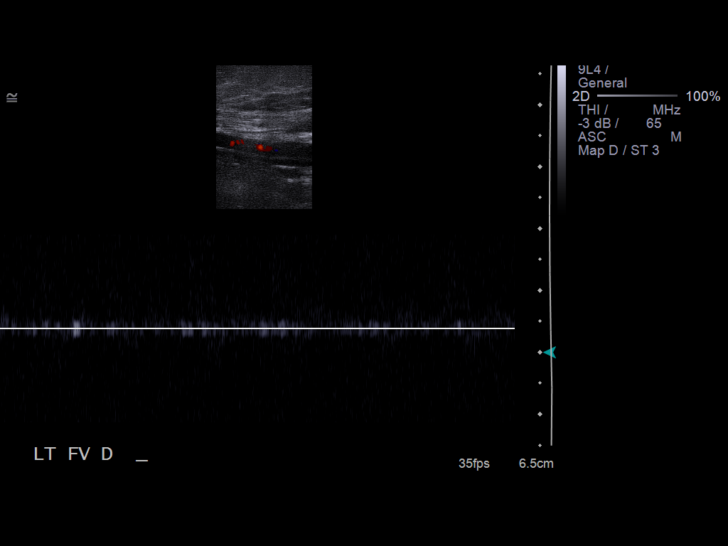
[im 25/36]
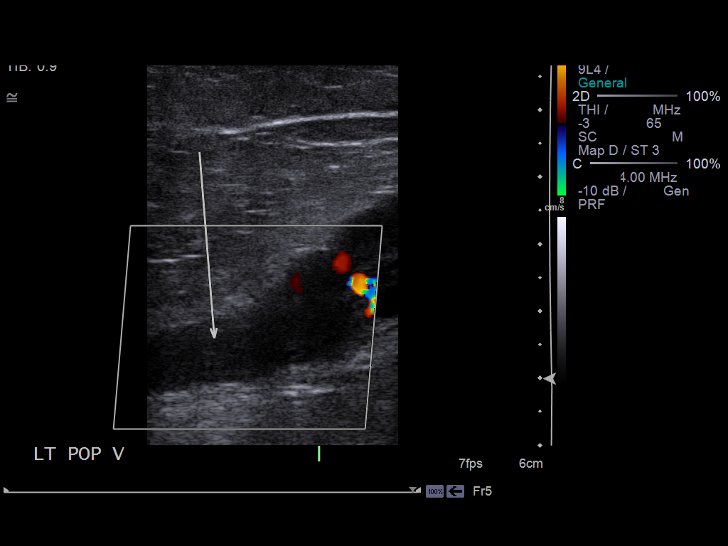
[im 28/36]
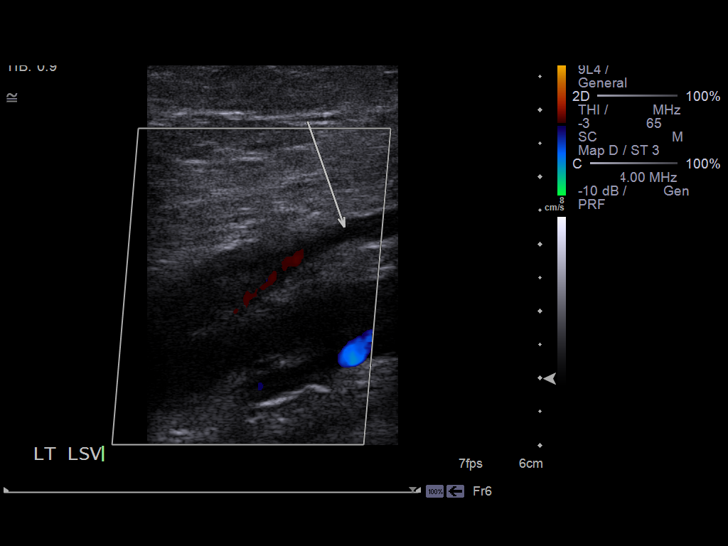
[im 29/36]
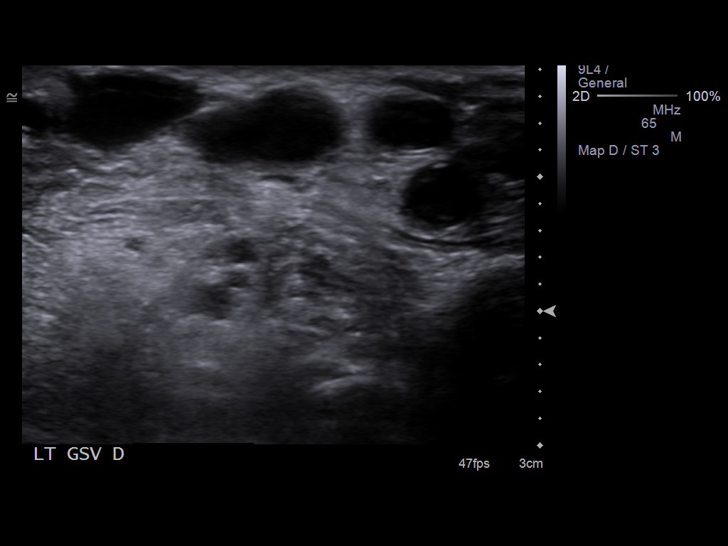
[im 32/36]
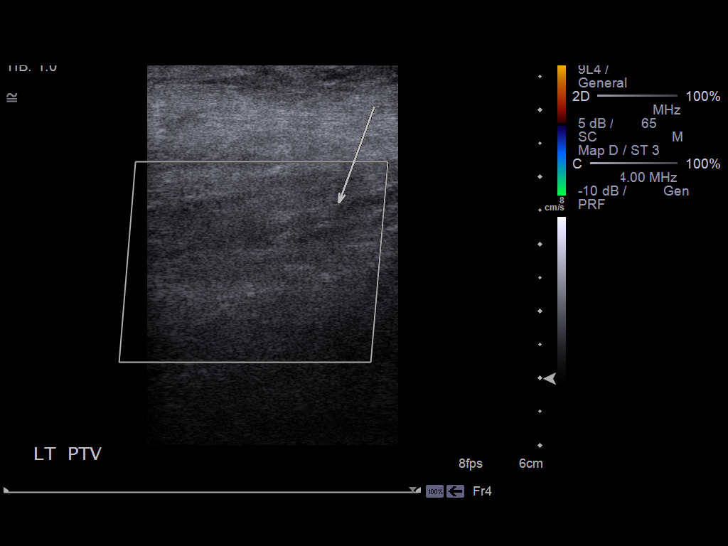
[im 36/36]
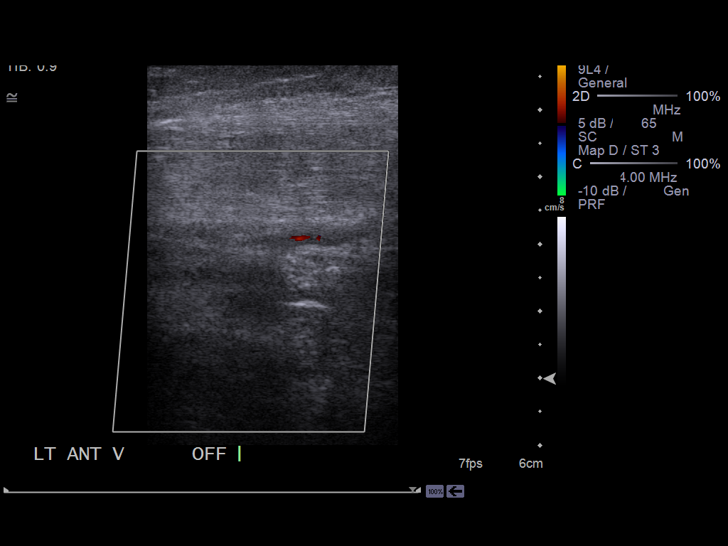

[14 of 24 positions shown; findings below may reference images not displayed]

FINDINGS: Ultrasound examination of the left lower extremity was
performed.  Nonobstructive thrombus noted in the left common
femoral vein.
There is a obstructive thrombus in the femoral vein extending in
popliteal vein, posterior tibial vein and peroneal vein with
decreased compressibility.  Findings are consistent with deep vein
thrombosis.
IMPRESSION: Deep vein thrombosis with occlusive thrombus femoral vein extending
in the left popliteal left peroneal and left posterior tibial vein.

Nonocclusive thrombus in the left common femoral vein.

This was made a call report.

## 2013-10-04 ENCOUNTER — Encounter (HOSPITAL_COMMUNITY): Payer: Medicare Other | Attending: Hematology and Oncology

## 2013-10-04 ENCOUNTER — Other Ambulatory Visit (HOSPITAL_COMMUNITY): Payer: Self-pay | Admitting: Oncology

## 2013-10-04 DIAGNOSIS — D509 Iron deficiency anemia, unspecified: Secondary | ICD-10-CM

## 2013-10-04 DIAGNOSIS — I82409 Acute embolism and thrombosis of unspecified deep veins of unspecified lower extremity: Secondary | ICD-10-CM

## 2013-10-04 DIAGNOSIS — D6859 Other primary thrombophilia: Secondary | ICD-10-CM | POA: Diagnosis present

## 2013-10-04 DIAGNOSIS — C49A0 Gastrointestinal stromal tumor, unspecified site: Secondary | ICD-10-CM

## 2013-10-04 DIAGNOSIS — D6851 Activated protein C resistance: Secondary | ICD-10-CM

## 2013-10-04 LAB — COMPREHENSIVE METABOLIC PANEL WITH GFR
ALT: 22 U/L (ref 0–35)
AST: 21 U/L (ref 0–37)
Albumin: 3.5 g/dL (ref 3.5–5.2)
Alkaline Phosphatase: 85 U/L (ref 39–117)
Anion gap: 9 (ref 5–15)
BUN: 11 mg/dL (ref 6–23)
CO2: 25 meq/L (ref 19–32)
Calcium: 9.2 mg/dL (ref 8.4–10.5)
Chloride: 109 meq/L (ref 96–112)
Creatinine, Ser: 0.71 mg/dL (ref 0.50–1.10)
GFR calc Af Amer: >90 mL/min
GFR calc non Af Amer: >90 mL/min
Glucose, Bld: 95 mg/dL (ref 70–99)
Potassium: 4.5 meq/L (ref 3.7–5.3)
Sodium: 143 meq/L (ref 137–147)
Total Bilirubin: 0.2 mg/dL — ABNORMAL LOW (ref 0.3–1.2)
Total Protein: 7.6 g/dL (ref 6.0–8.3)

## 2013-10-04 LAB — CBC WITH DIFFERENTIAL/PLATELET
Basophils Absolute: 0 10*3/uL (ref 0.0–0.1)
Basophils Relative: 0 % (ref 0–1)
Eosinophils Absolute: 0.1 10*3/uL (ref 0.0–0.7)
Eosinophils Relative: 3 % (ref 0–5)
HCT: 42 % (ref 36.0–46.0)
Hemoglobin: 14 g/dL (ref 12.0–15.0)
Lymphocytes Relative: 43 % (ref 12–46)
Lymphs Abs: 1.9 10*3/uL (ref 0.7–4.0)
MCH: 29.4 pg (ref 26.0–34.0)
MCHC: 33.3 g/dL (ref 30.0–36.0)
MCV: 88.2 fL (ref 78.0–100.0)
Monocytes Absolute: 0.4 10*3/uL (ref 0.1–1.0)
Monocytes Relative: 9 % (ref 3–12)
Neutro Abs: 2 10*3/uL (ref 1.7–7.7)
Neutrophils Relative %: 45 % (ref 43–77)
Platelets: 272 10*3/uL (ref 150–400)
RBC: 4.76 MIL/uL (ref 3.87–5.11)
RDW: 13.5 % (ref 11.5–15.5)
WBC: 4.5 10*3/uL (ref 4.0–10.5)

## 2013-10-04 LAB — FERRITIN: Ferritin: 39 ng/mL (ref 10–291)

## 2013-10-04 LAB — LACTATE DEHYDROGENASE: LDH: 199 U/L (ref 94–250)

## 2013-10-04 LAB — PROTIME-INR
INR: 3.5 — ABNORMAL HIGH (ref 0.00–1.49)
Prothrombin Time: 35.1 s — ABNORMAL HIGH (ref 11.6–15.2)

## 2013-10-04 NOTE — Progress Notes (Signed)
Labs for pt,cbcd,cmp,ldh,ferr

## 2013-10-15 NOTE — Progress Notes (Signed)
[name], [place] [address]  GIST (gastrointestinal stromal tumor), malignant - Plan: CBC with Differential, Comprehensive metabolic panel, CT Abdomen Pelvis W Contrast  Factor V Leiden  Chronic anticoagulation  ANEMIA, IRON DEFICIENCY, CHRONIC - Plan: CBC with Differential, Iron and TIBC, Ferritin, Soluble transferrin receptor  Leg wound, left, initial encounter - Plan: ciprofloxacin (CIPRO) 500 MG tablet  CURRENT THERAPY: Watchful expectation, status post resection of Gist, stage II, intolerant of imatinib.  Chronic anticoagulation with Coumadin for Factor V Leiden.  INTERVAL HISTORY: [name] 63 y.o. female returns for  regular  visit for followup of iron deficiency with stage II GIST, no adjuvant treatment due to intolerance of imatinib, in the setting of thrombophilia requiring lifelong anticoagulation due to factor V Leiden mutation while taking warfarin.   I personally reviewed and went over laboratory results with the patient.  The results are noted within this dictation.  Her INR is pending at the time of this dictation.   She denies any oncology complaints.  She denies any abdominal pain, weight loss, decreased appetite, blood in stool, black tarry stool, pelvic pain, and urinary complaints.  Additionally, she denies any bleeding and DVT/PE signs and symptoms.  She notes a left leg wound that is refractory to healing.  She notes that she has seen the wound clinic in the past for previous wounds and the patient reports that "they were not helpful."  She declines a referral today to the wound clinic.  She is applying neosporin to the wound and keeping it cleanly dressed.  There is healthy granulating tissue at the edge of wound.  There is associated erythema that is warm to the the touch.  She notes tenderness with palpation.  She is encouraged to keep the site clean and when the risk of contamination is low, she is to leave it open to the air to  help dry out the moist wound.    Oncologically, and hematologically, she denies any complaints and ROS questioning is negative.  Past Medical History  Diagnosis Date  . Vitamin B12 deficiency     vit b12 1000 mcg monthly  . Cellulitis of left leg 2006  . Ulcer 05/2009    esophageal  . Clotting disorder     heterozygosity from factor v leiden  . Pernicious anemia 07/10/2010  . DVT (deep venous thrombosis) 07/10/2010    on coumadin  . Factor V Leiden   . Anxiety   . 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 Allergic urticaria 01/04/2011    Rash from tape.  Marland Kitchen GERD (gastroesophageal reflux disease)   . Ventricular tachycardia 03/26/11    has ANEMIA, IRON DEFICIENCY, CHRONIC; Reflux esophagitis; GI BLEEDING; WEIGHT GAIN; CONTUSION, ARM; DVT (deep venous thrombosis); Pernicious anemia; Factor V Leiden; Syncope; Acute blood loss anemia; Chronic ulcer of left leg; Chronic anticoagulation; Abnormal CXR; Lip swelling; GIST (gastrointestinal stromal tumor), malignant; Allergic urticaria; SVT (supraventricular tachycardia); Hypotension, iatrogenic; Ventricular tachycardia; Bradycardia; Colonic cancer; and Abnormal CT scan, colon on her problem list.     is allergic to cephalexin; dexlansoprazole; latex; other; penicillins; tape; enoxaparin sodium; nylon; omeprazole; and sulfonamide derivatives.  Ms. Haggart had no medications administered during this visit.  Past Surgical History  Procedure Laterality Date  . Abdominal hysterectomy  1989  . Balloon dilation  12/11/2010    Procedure: BALLOON DILATION;  Surgeon: Rogene Houston, MD;  [facility];  Service: Endoscopy;  Laterality: N/A;  .  Laparotomy  01/05/2011    Procedure: EXPLORATORY LAPAROTOMY;  Surgeon: Jamesetta So;  Location: [facility];  Service: General;  Laterality: N/A;  . Bowel resection  01/05/2011    Procedure: SMALL BOWEL RESECTION;  Surgeon: Jamesetta So;  Location: [facility];  Service: General;;  Partial Small Bowel  Resection  . Givens capsule study  01/02/2011    Procedure: GIVENS CAPSULE STUDY;  Surgeon: Rogene Houston, MD;  Location: [facility];  Service: Endoscopy;  Laterality: N/A;  . Colonoscopy  02/25/2012    Procedure: COLONOSCOPY;  Surgeon: Rogene Houston, MD;  Location: [facility];  Service: Endoscopy;  Laterality: N/A;  1200    Denies any headaches, dizziness, double vision, fevers, chills, night sweats, nausea, vomiting, diarrhea, constipation, chest pain, heart palpitations, shortness of breath, blood in stool, black tarry stool, urinary pain, urinary burning, urinary frequency, hematuria.   PHYSICAL EXAMINATION  ECOG PERFORMANCE STATUS: 0 - Asymptomatic  Filed Vitals:   10/16/13 1046  BP: 145/72  Pulse: 57  Temp: 98.1 F (36.7 C)  Resp: 18    GENERAL:alert, no distress, well nourished, well developed, comfortable, cooperative, obese and smiling SKIN: skin color, texture, turgor are normal, no rashes or significant lesions HEAD: Normocephalic, No masses, lesions, tenderness or abnormalities EYES: normal, PERRLA, EOMI, Conjunctiva are pink and non-injected EARS: External ears normal OROPHARYNX:lips, buccal mucosa, and tongue normal and mucous membranes are moist  NECK: supple, no adenopathy, thyroid normal size, non-tender, without nodularity, no stridor, non-tender, trachea midline LYMPH:  no palpable lymphadenopathy, no hepatosplenomegaly BREAST:not examined LUNGS: clear to auscultation and percussion HEART: regular rate & rhythm, no murmurs, no gallops, S1 normal and S2 normal ABDOMEN:abdomen soft, non-tender, obese, normal bowel sounds and no masses or organomegaly BACK: Back symmetric, no curvature. EXTREMITIES:less then 2 second capillary refill, no joint deformities, effusion, or inflammation, no edema, no skin discoloration, no clubbing, no cyanosis, positive findings:  Left ankle wound measuring 2.5 cm, cleanly dressed with clear fluid discharge.  NEURO: alert &  oriented x 3 with fluent speech, no focal motor/sensory deficits, gait normal   LABORATORY DATA: CBC    Component Value Date/Time   WBC 4.5 10/04/2013 0847   RBC 4.76 10/04/2013 0847   RBC 3.81* 06/07/2009 1230   HGB 14.0 10/04/2013 0847   HCT 42.0 10/04/2013 0847   PLT 272 10/04/2013 0847   MCV 88.2 10/04/2013 0847   MCH 29.4 10/04/2013 0847   MCHC 33.3 10/04/2013 0847   RDW 13.5 10/04/2013 0847   LYMPHSABS 1.9 10/04/2013 0847   MONOABS 0.4 10/04/2013 0847   EOSABS 0.1 10/04/2013 0847   BASOSABS 0.0 10/04/2013 0847      Chemistry      Component Value Date/Time   NA 143 10/04/2013 0847   K 4.5 10/04/2013 0847   CL 109 10/04/2013 0847   CO2 25 10/04/2013 0847   BUN 11 10/04/2013 0847   CREATININE 0.71 10/04/2013 0847      Component Value Date/Time   CALCIUM 9.2 10/04/2013 0847   ALKPHOS 85 10/04/2013 0847   AST 21 10/04/2013 0847   ALT 22 10/04/2013 0847   BILITOT 0.2* 10/04/2013 0847     Lab Results  Component Value Date   INR 3.50* 10/04/2013   INR 2.73* 09/04/2013   INR 2.43* 08/07/2013      ASSESSMENT:  1. Stage II GIST status post resection in December of 2012, cardiac dysrhythmia adverse effects to imatinib with lack of desire to try an alternative agent  adjuvantly, for repeat CT scan to assess status of disease.  2. Thrombophilia presenting as factor V Leiden mutation, on life long warfarin, INR today is pending  3. Iron deficiency with last ferritin of 39 with normal Hgb on 10/04/2013. 4. Left leg wound   Patient Active Problem List   Diagnosis Date Noted  . Colonic cancer 09/21/2012  . Abnormal CT scan, colon 09/21/2012  . Bradycardia 04/19/2011  . Hypotension, iatrogenic 03/26/2011  . Ventricular tachycardia 03/26/2011  . SVT (supraventricular tachycardia) 03/25/2011  . Allergic urticaria 01/04/2011  . Lip swelling 01/03/2011  . GIST (gastrointestinal stromal tumor), malignant 01/03/2011  . Syncope 01/01/2011  . Acute blood loss anemia 01/01/2011  . Chronic ulcer of left leg 01/01/2011  .  Chronic anticoagulation 01/01/2011  . Abnormal CXR 01/01/2011  . Factor V Leiden   . DVT (deep venous thrombosis) 07/10/2010  . Pernicious anemia 07/10/2010  . CONTUSION, ARM 02/25/2010  . ANEMIA, IRON DEFICIENCY, CHRONIC 08/27/2009  . WEIGHT GAIN 08/27/2009  . Reflux esophagitis 07/05/2009  . GI BLEEDING 06/11/2009     PLAN:  1. I personally reviewed and went over laboratory results with the patient.  The results are noted within this dictation. 2. Repeat CT imaging in 3 months. 3. Labs as directed: INR 4. Continue Coumadin dose as directed 5. Labs in 3 months: CBC diff, CMET, Ferritin 6. Recommend Neosporin, triple antibiotic, polysporin ointment to leg wound. 7. Recommend leaving it open to air when at home resting and the risk of contamination of wound is limited 8. Rx for Cipro 500 mg BID x 10 days 9. INR pending from today 10. Return in 3 months for follow-up   THERAPY PLAN:  We will perform a CT abd/pelvis in December 2015 for annual surveillance.  Otherwise, from a hematologic standpoint, we will monitor for iron deficiency and maintain a therapeutic INR for her Factor V Leiden disease.  All questions were answered. The patient knows to call the clinic with any problems, questions or concerns. We can certainly see the patient much sooner if necessary.  Patient and plan discussed with Dr. Farrel Gobble and he is in agreement with the aforementioned.   Cecilia Vancleve 10/16/2013

## 2013-10-16 ENCOUNTER — Encounter (HOSPITAL_BASED_OUTPATIENT_CLINIC_OR_DEPARTMENT_OTHER): Payer: Medicare Other | Admitting: Oncology

## 2013-10-16 ENCOUNTER — Encounter (HOSPITAL_COMMUNITY): Payer: Self-pay | Admitting: *Deleted

## 2013-10-16 ENCOUNTER — Encounter (HOSPITAL_BASED_OUTPATIENT_CLINIC_OR_DEPARTMENT_OTHER): Payer: Medicare Other

## 2013-10-16 ENCOUNTER — Other Ambulatory Visit (HOSPITAL_COMMUNITY): Payer: Medicare Other

## 2013-10-16 VITALS — BP 145/72 | HR 57 | Temp 98.1°F | Resp 18 | Wt 254.0 lb

## 2013-10-16 DIAGNOSIS — S81802A Unspecified open wound, left lower leg, initial encounter: Secondary | ICD-10-CM

## 2013-10-16 DIAGNOSIS — I82409 Acute embolism and thrombosis of unspecified deep veins of unspecified lower extremity: Secondary | ICD-10-CM | POA: Diagnosis not present

## 2013-10-16 DIAGNOSIS — D6851 Activated protein C resistance: Secondary | ICD-10-CM

## 2013-10-16 DIAGNOSIS — D509 Iron deficiency anemia, unspecified: Secondary | ICD-10-CM

## 2013-10-16 DIAGNOSIS — D6859 Other primary thrombophilia: Secondary | ICD-10-CM

## 2013-10-16 DIAGNOSIS — C49A0 Gastrointestinal stromal tumor, unspecified site: Secondary | ICD-10-CM

## 2013-10-16 DIAGNOSIS — C494 Malignant neoplasm of connective and soft tissue of abdomen: Secondary | ICD-10-CM

## 2013-10-16 DIAGNOSIS — Z7901 Long term (current) use of anticoagulants: Secondary | ICD-10-CM

## 2013-10-16 LAB — PROTIME-INR
INR: 3.28 — ABNORMAL HIGH (ref 0.00–1.49)
Prothrombin Time: 33.4 seconds — ABNORMAL HIGH (ref 11.6–15.2)

## 2013-10-16 MED ORDER — CIPROFLOXACIN HCL 500 MG PO TABS: 500.0000 mg | ORAL_TABLET | Freq: Two times a day (BID) | ORAL | Status: DC

## 2013-10-16 NOTE — Progress Notes (Signed)
LABS FOR PT

## 2013-10-16 NOTE — Progress Notes (Signed)
INR results received prior to pt leaving from appointment today.  Informed to hold Coumadin for 2 days, then restart on same dose (7mg /7mg /6mg ) and return in one week for repeat INR per Robynn Pane, PA-C.

## 2013-10-16 NOTE — Patient Instructions (Signed)
[facility] Discharge Instructions  RECOMMENDATIONS MADE BY THE CONSULTANT AND ANY TEST RESULTS WILL BE SENT TO YOUR REFERRING PHYSICIAN.  EXAM FINDINGS BY THE PHYSICIAN TODAY AND SIGNS OR SYMPTOMS TO REPORT TO CLINIC OR PRIMARY PHYSICIAN: Exam and findings as discussed by Robynn Pane, PA-C.  MEDICATIONS PRESCRIBED:  Cipro 500mg  twice a day for 10 days  INSTRUCTIONS/FOLLOW-UP: CT scan of abdomen and pelvis as scheduled Return in 3 months for office visit and lab work.  Thank you for choosing [facility] to provide your oncology and hematology care.  To afford each patient quality time with our providers, please arrive at least 15 minutes before your scheduled appointment time.  With your help, our goal is to use those 15 minutes to complete the necessary work-up to ensure our physicians have the information they need to help with your evaluation and healthcare recommendations.    Effective January 1st, 2014, we ask that you re-schedule your appointment with our physicians should you arrive 10 or more minutes late for your appointment.  We strive to give you quality time with our providers, and arriving late affects you and other patients whose appointments are after yours.    Again, thank you for choosing [facility]  Our hope is that these requests will decrease the amount of time that you wait before being seen by our physicians.       _____________________________________________________________  Should you have questions after your visit to [facility], please contact our office at [phone] between the hours of 8:30 a.m. and 4:30 p.m.  Voicemails left after 4:30 p.m. will not be returned until the following business day.  For prescription refill requests, have your pharmacy contact our office with your prescription refill request.    _______________________________________________________________  We hope that we have  given you very good care.  You may receive a patient satisfaction survey in the mail, please complete it and return it as soon as possible.  We value your feedback!  _______________________________________________________________  Have you asked about our STAR program?  STAR stands for Survivorship Training and Rehabilitation, and this is a nationally recognized cancer care program that focuses on survivorship and rehabilitation.  Cancer and cancer treatments may cause problems, such as, pain, making you feel tired and keeping you from doing the things that you need or want to do. Cancer rehabilitation can help. Our goal is to reduce these troubling effects and help you have the best quality of life possible.  You may receive a survey from a nurse that asks questions about your current state of health.  Based on the survey results, all eligible patients will be referred to the [facility] program for an evaluation so we can better serve you!  A frequently asked questions sheet is available upon request.

## 2013-10-18 ENCOUNTER — Ambulatory Visit (HOSPITAL_COMMUNITY): Payer: Medicare Other | Admitting: Oncology

## 2013-10-18 ENCOUNTER — Other Ambulatory Visit (HOSPITAL_COMMUNITY): Payer: Medicare Other

## 2013-10-20 ENCOUNTER — Encounter (HOSPITAL_BASED_OUTPATIENT_CLINIC_OR_DEPARTMENT_OTHER): Payer: Medicare Other

## 2013-10-20 ENCOUNTER — Telehealth (HOSPITAL_COMMUNITY): Payer: Self-pay | Admitting: *Deleted

## 2013-10-20 DIAGNOSIS — D6851 Activated protein C resistance: Secondary | ICD-10-CM

## 2013-10-20 DIAGNOSIS — C494 Malignant neoplasm of connective and soft tissue of abdomen: Secondary | ICD-10-CM

## 2013-10-20 DIAGNOSIS — I82409 Acute embolism and thrombosis of unspecified deep veins of unspecified lower extremity: Secondary | ICD-10-CM

## 2013-10-20 LAB — PROTIME-INR
INR: 1.53 — AB (ref 0.00–1.49)
Prothrombin Time: 18.4 seconds — ABNORMAL HIGH (ref 11.6–15.2)

## 2013-10-20 NOTE — Telephone Encounter (Signed)
Instructed to take coumadin 6 mg daily until return from trip then recheck INR

## 2013-10-20 NOTE — Telephone Encounter (Signed)
Pt had pt/inr today.   Previously on 9/21 she was informed to hold Coumadin for 2 days, then restart on same dose (7mg /7mg /6mg ). She took none on Mon and Tuesday and then 7mg  on Wednesday and Thursday.

## 2013-10-20 NOTE — Progress Notes (Signed)
LABS FOR PT

## 2013-10-23 ENCOUNTER — Ambulatory Visit (HOSPITAL_COMMUNITY): Payer: Medicare Other

## 2013-10-23 ENCOUNTER — Other Ambulatory Visit (HOSPITAL_COMMUNITY): Payer: Medicare Other

## 2013-10-30 ENCOUNTER — Encounter (HOSPITAL_COMMUNITY): Payer: Medicare Other | Attending: Hematology and Oncology

## 2013-10-30 ENCOUNTER — Other Ambulatory Visit (HOSPITAL_COMMUNITY): Payer: Self-pay | Admitting: Oncology

## 2013-10-30 DIAGNOSIS — I82409 Acute embolism and thrombosis of unspecified deep veins of unspecified lower extremity: Secondary | ICD-10-CM | POA: Insufficient documentation

## 2013-10-30 DIAGNOSIS — D509 Iron deficiency anemia, unspecified: Secondary | ICD-10-CM | POA: Insufficient documentation

## 2013-10-30 DIAGNOSIS — C494 Malignant neoplasm of connective and soft tissue of abdomen: Secondary | ICD-10-CM

## 2013-10-30 DIAGNOSIS — C499 Malignant neoplasm of connective and soft tissue, unspecified: Secondary | ICD-10-CM | POA: Insufficient documentation

## 2013-10-30 DIAGNOSIS — D6851 Activated protein C resistance: Secondary | ICD-10-CM | POA: Insufficient documentation

## 2013-10-30 LAB — PROTIME-INR
INR: 2.15 — ABNORMAL HIGH (ref 0.00–1.49)
PROTHROMBIN TIME: 24 s — AB (ref 11.6–15.2)

## 2013-10-30 NOTE — Progress Notes (Signed)
Labs for pt/inr 

## 2013-11-14 ENCOUNTER — Encounter (HOSPITAL_BASED_OUTPATIENT_CLINIC_OR_DEPARTMENT_OTHER): Payer: Medicare Other

## 2013-11-14 DIAGNOSIS — I82409 Acute embolism and thrombosis of unspecified deep veins of unspecified lower extremity: Secondary | ICD-10-CM | POA: Diagnosis not present

## 2013-11-14 DIAGNOSIS — D6851 Activated protein C resistance: Secondary | ICD-10-CM

## 2013-11-14 LAB — PROTIME-INR
INR: 2.5 — AB (ref 0.00–1.49)
PROTHROMBIN TIME: 27.2 s — AB (ref 11.6–15.2)

## 2013-11-14 NOTE — Progress Notes (Signed)
LABS FOR PT/INR 

## 2013-11-22 ENCOUNTER — Encounter (INDEPENDENT_AMBULATORY_CARE_PROVIDER_SITE_OTHER): Payer: Self-pay

## 2013-11-22 ENCOUNTER — Ambulatory Visit (INDEPENDENT_AMBULATORY_CARE_PROVIDER_SITE_OTHER): Payer: Medicare Other | Admitting: Family Medicine

## 2013-11-22 ENCOUNTER — Encounter: Payer: Self-pay | Admitting: Family Medicine

## 2013-11-22 VITALS — BP 140/87 | HR 67 | Temp 97.1°F | Ht 66.5 in | Wt 251.0 lb

## 2013-11-22 DIAGNOSIS — I83221 Varicose veins of left lower extremity with both ulcer of thigh and inflammation: Secondary | ICD-10-CM

## 2013-11-22 DIAGNOSIS — I83222 Varicose veins of left lower extremity with both ulcer of calf and inflammation: Secondary | ICD-10-CM

## 2013-11-22 DIAGNOSIS — I83228 Varicose veins of left lower extremity with both ulcer of other part of lower extremity and inflammation: Secondary | ICD-10-CM

## 2013-11-22 DIAGNOSIS — I83023 Varicose veins of left lower extremity with ulcer of ankle: Secondary | ICD-10-CM

## 2013-11-22 DIAGNOSIS — I83225 Varicose veins of left lower extremity with both ulcer other part of foot and inflammation: Secondary | ICD-10-CM

## 2013-11-22 DIAGNOSIS — I83224 Varicose veins of left lower extremity with both ulcer of heel and midfoot and inflammation: Secondary | ICD-10-CM

## 2013-11-22 DIAGNOSIS — I83223 Varicose veins of left lower extremity with both ulcer of ankle and inflammation: Secondary | ICD-10-CM

## 2013-11-22 DIAGNOSIS — I83229 Varicose veins of left lower extremity with both ulcer of unspecified site and inflammation: Secondary | ICD-10-CM

## 2013-11-22 DIAGNOSIS — L97329 Non-pressure chronic ulcer of left ankle with unspecified severity: Principal | ICD-10-CM

## 2013-11-22 MED ORDER — DOXYCYCLINE HYCLATE 100 MG PO TABS
100.0000 mg | ORAL_TABLET | Freq: Two times a day (BID) | ORAL | Status: DC
Start: 2013-11-22 — End: 2013-12-25

## 2013-11-22 NOTE — Progress Notes (Signed)
   Subjective:    Patient ID: [name], female    DOB: [birth year], 63 y.o.   MRN: [id number]  HPI C/o left leg or pre tibial ulcer.  She has hx of venous stasis dermatitis and she has problems with ulcers on occasion.  She has been seeing oncology PA who put her on cipro and it didn't help.  She  Has seen a wound clinic in [place] and she has this cared for by them.   She is allergic to Unnas boot and she is allergic to a lot of abx's and she can only have paper tape due allergies.  She  Had a DVT in her left LE and this has caused constant Left Lower extremity edema.  She does wear compression stocking.   Review of Systems    No chest pain, SOB, HA, dizziness, vision change, N/V, diarrhea, constipation, dysuria, urinary urgency or frequency, myalgias, arthralgias or rash.  Objective:   Physical Exam Vital signs noted  Well developed well nourished female.  HEENT - Head atraumatic Normocephalic Respiratory - Lungs CTA bilateral Cardiac - RRR S1 and S2 without murmur GI - Abdomen soft Nontender and bowel sounds active x 4 Extremities - Left lower extremity with venous stasis changes and ulcer on the medial pre-tibial region the size of a quarter.         Assessment & Plan:  Venous stasis ulcer of ankle, left - Plan: doxycycline (VIBRA-TABS) 100 MG tablet, Ambulatory referral to Wound Clinic  Wet to dry dressing left lower extremity qd  Lysbeth Penner FNP

## 2013-12-12 ENCOUNTER — Other Ambulatory Visit (HOSPITAL_COMMUNITY): Payer: Medicare Other

## 2013-12-19 ENCOUNTER — Encounter (HOSPITAL_COMMUNITY): Payer: Medicare Other | Attending: Hematology and Oncology

## 2013-12-19 DIAGNOSIS — D509 Iron deficiency anemia, unspecified: Secondary | ICD-10-CM | POA: Diagnosis not present

## 2013-12-19 DIAGNOSIS — D6851 Activated protein C resistance: Secondary | ICD-10-CM | POA: Insufficient documentation

## 2013-12-19 DIAGNOSIS — C499 Malignant neoplasm of connective and soft tissue, unspecified: Secondary | ICD-10-CM | POA: Insufficient documentation

## 2013-12-19 DIAGNOSIS — I82409 Acute embolism and thrombosis of unspecified deep veins of unspecified lower extremity: Secondary | ICD-10-CM

## 2013-12-19 LAB — PROTIME-INR
INR: 2.73 — ABNORMAL HIGH (ref 0.00–1.49)
Prothrombin Time: 29.1 seconds — ABNORMAL HIGH (ref 11.6–15.2)

## 2013-12-19 NOTE — Progress Notes (Signed)
LABS FOR PT/INR 

## 2013-12-25 ENCOUNTER — Encounter (HOSPITAL_COMMUNITY): Payer: Self-pay

## 2013-12-25 ENCOUNTER — Emergency Department (HOSPITAL_COMMUNITY)
Admission: EM | Admit: 2013-12-25 | Discharge: 2013-12-25 | Disposition: A | Discharge status: Home / Self Care | Payer: Medicare Other | Attending: Emergency Medicine | Admitting: Emergency Medicine

## 2013-12-25 DIAGNOSIS — R51 Headache: Secondary | ICD-10-CM | POA: Diagnosis present

## 2013-12-25 DIAGNOSIS — T798XXA Other early complications of trauma, initial encounter: Secondary | ICD-10-CM

## 2013-12-25 DIAGNOSIS — D510 Vitamin B12 deficiency anemia due to intrinsic factor deficiency: Secondary | ICD-10-CM | POA: Diagnosis not present

## 2013-12-25 DIAGNOSIS — Z8659 Personal history of other mental and behavioral disorders: Secondary | ICD-10-CM | POA: Diagnosis not present

## 2013-12-25 DIAGNOSIS — K219 Gastro-esophageal reflux disease without esophagitis: Secondary | ICD-10-CM | POA: Diagnosis not present

## 2013-12-25 DIAGNOSIS — Z86718 Personal history of other venous thrombosis and embolism: Secondary | ICD-10-CM | POA: Diagnosis not present

## 2013-12-25 DIAGNOSIS — L089 Local infection of the skin and subcutaneous tissue, unspecified: Secondary | ICD-10-CM | POA: Diagnosis not present

## 2013-12-25 DIAGNOSIS — R001 Bradycardia, unspecified: Secondary | ICD-10-CM | POA: Diagnosis not present

## 2013-12-25 DIAGNOSIS — Z880 Allergy status to penicillin: Secondary | ICD-10-CM | POA: Diagnosis not present

## 2013-12-25 DIAGNOSIS — Z91040 Latex allergy status: Secondary | ICD-10-CM | POA: Diagnosis not present

## 2013-12-25 DIAGNOSIS — Z7901 Long term (current) use of anticoagulants: Secondary | ICD-10-CM | POA: Diagnosis not present

## 2013-12-25 MED ORDER — DOXYCYCLINE HYCLATE 100 MG PO TABS
100.0000 mg | ORAL_TABLET | Freq: Once | ORAL | Status: AC
  Administered 2013-12-25: 100 mg via ORAL
  Filled 2013-12-25: qty 1

## 2013-12-25 MED ORDER — DOXYCYCLINE HYCLATE 100 MG PO CAPS: 100.0000 mg | ORAL_CAPSULE | Freq: Two times a day (BID) | ORAL | Status: DC

## 2013-12-25 NOTE — ED Notes (Signed)
Discharge instructions given, pt demonstrated teach back and verbal understanding. No concerns voiced.  

## 2013-12-25 NOTE — Discharge Instructions (Signed)
There is no evidence for allergic reaction to cause your discomfort. It appears that there is a small wound of the edge of the nose, that has become infected. This may improve with warm compresses every 2-3 hours. An antibiotic prescription may help speed the recovery. Return here if you feel like you're swelling inside your mouth, or develop problems breathing.   Wound Infection A wound infection happens when a type of germ (bacteria) starts growing in the wound. In some cases, this can cause the wound to break open. If cared for properly, the infected wound will heal from the inside to the outside. Wound infections need treatment. CAUSES An infection is caused by bacteria growing in the wound.  SYMPTOMS   Increase in redness, swelling, or pain at the wound site.  Increase in drainage at the wound site.  Wound or bandage (dressing) starts to smell bad.  Fever.  Feeling tired or fatigued.  Pus draining from the wound. TREATMENT  Your health care provider will prescribe antibiotic medicine. The wound infection should improve within 24 to 48 hours. Any redness around the wound should stop spreading and the wound should be less painful.  HOME CARE INSTRUCTIONS   Only take over-the-counter or prescription medicines for pain, discomfort, or fever as directed by your health care provider.  Take your antibiotics as directed. Finish them even if you start to feel better.  Gently wash the area with mild soap and water 2 times a day, or as directed. Rinse off the soap. Pat the area dry with a clean towel. Do not rub the wound. This may cause bleeding.  Follow your health care provider's instructions for how often you need to change the dressing.  Apply ointment and a dressing to the wound as directed.  If the dressing sticks, moisten it with soapy water and gently remove it.  Change the bandage right away if it becomes wet, dirty, or develops a bad smell.  Take showers. Do not take  tub baths, swim, or do anything that may soak the wound until it is healed.  Avoid exercises that make you sweat heavily.  Use anti-itch medicine as directed by your health care provider. The wound may itch when it is healing. Do not pick or scratch at the wound.  Follow up with your health care provider to get your wound rechecked as directed. SEEK MEDICAL CARE IF:  You have an increase in swelling, pain, or redness around the wound.  You have an increase in the amount of pus coming from the wound.  There is a bad smell coming from the wound.  More of the wound breaks open.  You have a fever. MAKE SURE YOU:   Understand these instructions.  Will watch your condition.  Will get help right away if you are not doing well or get worse. Document Released: 10/11/2002 Document Revised: 01/17/2013 Document Reviewed: 05/18/2010 [facility] Patient Information 2015 [place], [place]. This information is not intended to replace advice given to you by your health care provider. Make sure you discuss any questions you have with your health care provider.

## 2013-12-25 NOTE — ED Notes (Signed)
Facial swelling per pt. Thought that I had a pimple coming up in my nose per pt. Took two benadryl this morning per pt.

## 2013-12-25 NOTE — ED Provider Notes (Signed)
CSN: [id number]     Arrival date & time 12/25/13  0344 History   First MD Initiated Contact with Patient 12/25/13 0359     Chief Complaint  Patient presents with  . Facial Pain     (Consider location/radiation/quality/duration/timing/severity/associated sxs/prior Treatment) HPI   [name] is a 63 y.o. female who is here for evaluation of swelling in her face, and a sore in her nose.  This constellation made her concerned that she was having an allergic reaction.  She has previously had reactions causing swelling in her mouth, and difficulty breathing.  She denies facial trauma, fever, chills, nausea, vomiting, weakness or dizziness.  There are no other known modifying factors.   Past Medical History  Diagnosis Date  . Vitamin B12 deficiency     vit b12 1000 mcg monthly  . Cellulitis of left leg 2006  . Ulcer 05/2009    esophageal  . Clotting disorder     heterozygosity from factor v leiden  . Pernicious anemia 07/10/2010  . DVT (deep venous thrombosis) 07/10/2010    on coumadin  . Factor V Leiden   . Anxiety   . 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 Allergic urticaria 01/04/2011    Rash from tape.  Marland Kitchen GERD (gastroesophageal reflux disease)   . Ventricular tachycardia 03/26/11   Past Surgical History  Procedure Laterality Date  . Abdominal hysterectomy  1989  . Balloon dilation  12/11/2010    Procedure: BALLOON DILATION;  Surgeon: Rogene Houston, MD;  [facility];  Service: Endoscopy;  Laterality: N/A;  . Laparotomy  01/05/2011    Procedure: EXPLORATORY LAPAROTOMY;  Surgeon: Jamesetta So;  Location: [facility];  Service: General;  Laterality: N/A;  . Bowel resection  01/05/2011    Procedure: SMALL BOWEL RESECTION;  Surgeon: Jamesetta So;  Location: [facility];  Service: General;;  Partial Small Bowel Resection  . Givens capsule study  01/02/2011    Procedure: GIVENS CAPSULE STUDY;  Surgeon: Rogene Houston, MD;  [facility];  Service:  Endoscopy;  Laterality: N/A;  . Colonoscopy  02/25/2012    Procedure: COLONOSCOPY;  Surgeon: Rogene Houston, MD;  [facility];  Service: Endoscopy;  Laterality: N/A;  1200   No family history on file. History  Substance Use Topics  . Smoking status: Never Smoker   . Smokeless tobacco: Never Used  . Alcohol Use: No   OB History    No data available     Review of Systems  All other systems reviewed and are negative.     Allergies  Cephalexin; Dexlansoprazole; Latex; Other; Penicillins; Tape; Enoxaparin sodium; Nylon; Omeprazole; and Sulfonamide derivatives  Home Medications   Prior to Admission medications   Medication Sig Start Date End Date Taking? Authorizing Provider  acetaminophen (TYLENOL) 325 MG tablet Take 650 mg by mouth every 6 (six) hours as needed. Pain   Yes Historical Provider, MD  cyanocobalamin (,VITAMIN B-12,) 1000 MCG/ML injection Inject 1 mL (1,000 mcg total) into the muscle every 30 (thirty) days. 02/01/13  Yes Baird Cancer, PA-C  iron polysaccharides (FERREX 150) 150 MG capsule Take 1 capsule (150 mg total) by mouth daily. Take 1 2x per week for maintenance 07/22/12  Yes Pieter Partridge, MD  lansoprazole (PREVACID) 15 MG capsule Take 15 mg by mouth daily.    Yes Historical Provider, MD  metoprolol tartrate (LOPRESSOR) 25 MG tablet Take 1 tablet (25 mg total)  by mouth 2 (two) times daily. 06/27/12 12/25/13 Yes Evans Lance, MD  doxycycline (VIBRAMYCIN) 100 MG capsule Take 1 capsule (100 mg total) by mouth 2 (two) times daily. 12/25/13   Richarda Blade, MD  warfarin (COUMADIN) 2 MG tablet Take 2 mg by mouth daily at 6 PM. Currently takes 7mg /7mg /6mg  05/11/13   Baird Cancer, PA-C  warfarin (COUMADIN) 5 MG tablet Take 4 mg by mouth daily. 7mg /7mg /6mg  08/07/13   Manon Hilding Kefalas, PA-C   BP 179/77 mmHg  Pulse 57  Temp(Src) 98 F (36.7 C)  Resp 20  SpO2 96% Physical Exam  Constitutional: She is oriented to person, place, and time. She appears  well-developed and well-nourished.  HENT:  Head: Normocephalic and atraumatic.  Right Ear: External ear normal.  Left Ear: External ear normal.  Very minimal swelling adjacent to the right nasal alar, and the right upper lip.  The right inferior alae, has an area of redness with induration, and an area of skin breakdown.  There is no drainage.  There is no proximal nasal swelling or deformity.  There is no associated adenopathy of the face or neck.  There is no angioedema of the oral tissues.  Eyes: Conjunctivae and EOM are normal. Pupils are equal, round, and reactive to light.  Neck: Normal range of motion and phonation normal. Neck supple.  Cardiovascular:  Bradycardia  Pulmonary/Chest: Effort normal. She exhibits no bony tenderness.  Abdominal: There is tenderness.  Musculoskeletal: Normal range of motion.  Neurological: She is alert and oriented to person, place, and time. No cranial nerve deficit or sensory deficit. She exhibits normal muscle tone. Coordination normal.  Skin: Skin is warm, dry and intact.  Psychiatric: She has a normal mood and affect. Her behavior is normal. Judgment and thought content normal.  Nursing note and vitals reviewed.   ED Course  Procedures (including critical care time)  Medications  doxycycline (VIBRA-TABS) tablet 100 mg (not administered)    Patient Vitals for the past 24 hrs:  BP Temp Pulse Resp SpO2  12/25/13 0400 179/77 mmHg 98 F (36.7 C) (!) 57 20 96 %    Findings discussed with patient and husband, all questions were answered.  Labs Review Labs Reviewed - No data to display  Imaging Review No results found.   EKG Interpretation None      MDM   Final diagnoses:  Wound infection, initial encounter    Minor facial infection .  Likely from skin breakdown of the nasal alar.  Doubt significant cellulitis, angioedema, serious bacterial infection or metabolic instability.  Nursing Notes Reviewed/ Care Coordinated Applicable  Imaging Reviewed Interpretation of Laboratory Data incorporated into ED treatment  The patient appears reasonably screened and/or stabilized for discharge and I doubt any other medical condition or other [facility] requiring further screening, evaluation, or treatment in the ED at this time prior to discharge.  Plan: Home Medications- Doxycycline; Home Treatments- Moist Compress q 2-3 hours; return here if the recommended treatment, does not improve the symptoms; Recommended follow up- PCP prn    Richarda Blade, MD 12/25/13 [phone]

## 2014-01-14 NOTE — Assessment & Plan Note (Addendum)
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  Ct abd/pelvis on 01/15/2014 shows NED.  Will repeat in 12 months for continued surveillance.

## 2014-01-14 NOTE — Assessment & Plan Note (Signed)
On lifelong B12, given to self at home.

## 2014-01-14 NOTE — Assessment & Plan Note (Signed)
L leg, on lifelong Coumadin therapy (due to Factor V Leiden Heterozygosity)

## 2014-01-14 NOTE — Progress Notes (Signed)
[name], [place] [address]  GIST (gastrointestinal stromal tumor), malignant  Factor V Leiden  DVT (deep venous thrombosis), left  Pernicious anemia  Iron deficiency anemia  CURRENT THERAPY: Watchful expectation, status post resection of Gist, stage II, intolerant of imatinib. Chronic anticoagulation with Coumadin for Factor V Leiden.  INTERVAL HISTORY: [name] 63 y.o. female returns for followup of iron deficiency with stage II GIST, no adjuvant treatment due to intolerance of imatinib (cardiac dysrhythmia), in the setting of thrombophilia requiring lifelong anticoagulation due to factor V Leiden mutation while taking warfarin.   I personally reviewed and went over laboratory results with the patient.  The results are noted within this dictation.  I personally reviewed and went over radiographic studies with the patient.  The results are noted within this dictation.  Her Ct abd/pelvis shows NED.  She denies any oncologic or hematologic complaints and ROS questioning is negative.   Past Medical History  Diagnosis Date  . Vitamin B12 deficiency     vit b12 1000 mcg monthly  . Cellulitis of left leg 2006  . Ulcer 05/2009    esophageal  . Clotting disorder     heterozygosity from factor v leiden  . Pernicious anemia 07/10/2010  . DVT (deep venous thrombosis) 07/10/2010    on coumadin  . Factor V Leiden   . Anxiety   . 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 Allergic urticaria 01/04/2011    Rash from tape.  Marland Kitchen GERD (gastroesophageal reflux disease)   . Ventricular tachycardia 03/26/11    has Iron deficiency anemia; Reflux esophagitis; GI BLEEDING; WEIGHT GAIN; CONTUSION, ARM; DVT (deep venous thrombosis); Pernicious anemia; Factor V Leiden; Syncope; Acute blood loss anemia; Chronic ulcer of left leg; Chronic anticoagulation; Abnormal CXR; Lip swelling; GIST (gastrointestinal stromal tumor), malignant; Allergic urticaria;  SVT (supraventricular tachycardia); Hypotension, iatrogenic; Ventricular tachycardia; Bradycardia; Colonic cancer; and Abnormal CT scan, colon on her problem list.     is allergic to imatinib; cephalexin; dexlansoprazole; latex; other; penicillins; tape; enoxaparin sodium; nylon; omeprazole; and sulfonamide derivatives.  [name] had no medications administered during this visit.  Past Surgical History  Procedure Laterality Date  . Abdominal hysterectomy  1989  . Balloon dilation  12/11/2010    Procedure: BALLOON DILATION;  Surgeon: Rogene Houston, MD;  [facility];  Service: Endoscopy;  Laterality: N/A;  . Laparotomy  01/05/2011    Procedure: EXPLORATORY LAPAROTOMY;  Surgeon: Jamesetta So;  Location: [facility];  Service: General;  Laterality: N/A;  . Bowel resection  01/05/2011    Procedure: SMALL BOWEL RESECTION;  Surgeon: Jamesetta So;  Location: [facility];  Service: General;;  Partial Small Bowel Resection  . Givens capsule study  01/02/2011    Procedure: GIVENS CAPSULE STUDY;  Surgeon: Rogene Houston, MD;  [facility];  Service: Endoscopy;  Laterality: N/A;  . Colonoscopy  02/25/2012    Procedure: COLONOSCOPY;  Surgeon: Rogene Houston, MD;  [facility];  Service: Endoscopy;  Laterality: N/A;  1200    Denies any headaches, dizziness, double vision, fevers, chills, night sweats, nausea, vomiting, diarrhea, constipation, chest pain, heart palpitations, shortness of breath, blood in stool, black tarry stool, urinary pain, urinary burning, urinary frequency, hematuria.   PHYSICAL EXAMINATION  ECOG PERFORMANCE STATUS: 0 - Asymptomatic  Filed Vitals:   01/16/14 0900  BP: 150/70  Pulse: 66  Temp: 98  F (36.7 C)  Resp: 18    GENERAL:alert, healthy, no distress, well nourished, well developed, comfortable, cooperative, obese and smiling SKIN: skin color, texture, turgor are normal, no rashes or significant lesions HEAD: Normocephalic, No masses,  lesions, tenderness or abnormalities EYES: normal, PERRLA, EOMI, Conjunctiva are pink and non-injected EARS: External ears normal OROPHARYNX:lips, buccal mucosa, and tongue normal and mucous membranes are moist  NECK: supple, no adenopathy, thyroid normal size, non-tender, without nodularity, no stridor, non-tender, trachea midline LYMPH:  no palpable lymphadenopathy BREAST:not examined LUNGS: clear to auscultation  HEART: regular rate & rhythm, no murmurs, no gallops, S1 normal and S2 normal ABDOMEN:abdomen soft, non-tender and normal bowel sounds BACK: Back symmetric, no curvature. EXTREMITIES:less then 2 second capillary refill, no joint deformities, effusion, or inflammation, no edema, no skin discoloration, no clubbing, no cyanosis  NEURO: alert & oriented x 3 with fluent speech, no focal motor/sensory deficits, gait normal    LABORATORY DATA: CBC    Component Value Date/Time   WBC 5.2 01/16/2014 0914   RBC 4.70 01/16/2014 0914   RBC 3.81* 06/07/2009 1230   HGB 13.4 01/16/2014 0914   HCT 42.7 01/16/2014 0914   PLT 293 01/16/2014 0914   MCV 90.9 01/16/2014 0914   MCH 28.5 01/16/2014 0914   MCHC 31.4 01/16/2014 0914   RDW 13.7 01/16/2014 0914   LYMPHSABS 2.1 01/16/2014 0914   MONOABS 0.6 01/16/2014 0914   EOSABS 0.1 01/16/2014 0914   BASOSABS 0.0 01/16/2014 0914      Chemistry      Component Value Date/Time   NA 141 01/16/2014 0914   K 3.9 01/16/2014 0914   CL 110 01/16/2014 0914   CO2 27 01/16/2014 0914   BUN 11 01/16/2014 0914   CREATININE 0.72 01/16/2014 0914      Component Value Date/Time   CALCIUM 9.3 01/16/2014 0914   ALKPHOS 90 01/16/2014 0914   AST 34 01/16/2014 0914   ALT 41* 01/16/2014 0914   BILITOT 0.4 01/16/2014 0914     Lab Results  Component Value Date   INR 2.57* 01/16/2014   INR 2.73* 12/19/2013   INR 2.50* 11/14/2013     RADIOGRAPHIC STUDIES:  01/15/2014   CLINICAL DATA: Subsequent encounter for GI stromal tumor of the small  bowel. Status post resection 01/03/2011.  EXAM: CT ABDOMEN AND PELVIS WITH CONTRAST  TECHNIQUE: Multidetector CT imaging of the abdomen and pelvis was performed using the standard protocol following bolus administration of intravenous contrast.  CONTRAST: 12mL OMNIPAQUE IOHEXOL 300 MG/ML SOLN  COMPARISON: 01/25/2013.  FINDINGS: Lower chest: Unremarkable.  Hepatobiliary: No focal abnormality within the liver parenchyma. There is no evidence for gallstones, gallbladder wall thickening, or pericholecystic fluid. No intrahepatic or extrahepatic biliary dilation.  Pancreas: No focal mass lesion. No dilatation of the main duct. No intraparenchymal cyst. No peripancreatic edema.  Spleen: No splenomegaly. No focal mass lesion.  Adrenals/Urinary Tract: Right adrenal gland is normal. 2.8 x 2.7 cm left adrenal nodule is unchanged since the prior study and also when comparing back to 10/29/2004 exam, consistent with benign adrenal adenoma. 11 mm low-density lesion in the interpolar right kidney has increased in size since 7 mm on the previous study and 5 mm from 01/25/2012. This measures near water attenuation and likely represents a cyst. Tiny probable cyst noted in the interpolar left kidney. Bladder is unremarkable.  Stomach/Bowel: Small hiatal hernia noted. Stomach is nondistended. Duodenum is normally positioned as is the ligament of Treitz. Suture line noted in the mid small  bowel. Terminal ileum is normal. Appendix is normal. No gross colonic mass. No colonic wall thickening. No substantial diverticular change.  Vascular/Lymphatic: No abdominal aortic aneurysm. The portal vein and superior mesenteric vein are patent. No evidence for lymphadenopathy in the abdomen. There are some superficial varices in the inferior aspect of the anterior abdominal wall. These varices may be due to stenosis or chronic occlusion of the left common iliac vein which is markedly  attenuated along with the left external iliac vein.  Reproductive: Uterus is surgically absent. No adnexal mass.  Other: No intraperitoneal free fluid.  Musculoskeletal: Bone windows reveal no worrisome lytic or sclerotic osseous lesions.  IMPRESSION: Stable exam. No evidence for recurrent disease in the abdomen or pelvis.  Stable left adrenal adenoma.  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   Electronically Signed  By: Misty Stanley M.D.  On: 01/15/2014 14:24        ASSESSMENT AND PLAN:  GIST (gastrointestinal stromal tumor), malignant Stage II GIST status post resection in December of 2012, cardiac dysrhythmia adverse effects to imatinib with lack of desire to try an alternative agent adjuvantly.  Ct abd/pelvis on 01/15/2014 shows NED.  Will repeat in 12 months for continued surveillance.   Factor V Leiden On lifelong anticoagulation with Coumadin.  Continue Vitamin-K antagonist as directed and INRs as directed.  INR today (01/16/2014) is therapeutic.  She is to continue the same dose of Coumadin and repeat INR in 1 months.  She is interested again in home monitoring.  I have sent a message to Lendell Caprice to get this service re-instituted for the patient.  Return in 6 months for follow-up.  DVT (deep venous thrombosis) L leg, on lifelong Coumadin therapy (due to Factor V Leiden Heterozygosity)  Pernicious anemia On lifelong B12, given to self at home.  Iron deficiency anemia Low-normal ferritin with a normal Hgb.  Will monitor.  If anemia is noted, will administer IV Feraheme.   THERAPY PLAN:  We will continue with lifelong anticoagulation.  She is stable on Coumadin without any contraindications at this time.  We will continue surveillance for her Stage II GIST with annual CT imaging surveillance.   All questions were answered. The patient knows to call the clinic with any problems, questions or concerns. We can certainly see the patient much  sooner if necessary.  Patient and plan discussed with Dr. Ancil Linsey and she is in agreement with the aforementioned.   KEFALAS,THOMAS 01/16/2014

## 2014-01-14 NOTE — Assessment & Plan Note (Signed)
Low-normal ferritin with a normal Hgb.  Will monitor.  If anemia is noted, will administer IV Feraheme.

## 2014-01-14 NOTE — Assessment & Plan Note (Addendum)
On lifelong anticoagulation with Coumadin.  Continue Vitamin-K antagonist as directed and INRs as directed.  INR today (01/16/2014) is therapeutic.  She is to continue the same dose of Coumadin and repeat INR in 1 months.  She is interested again in home monitoring.  I have sent a message to Lendell Caprice to get this service re-instituted for the patient.  Return in 6 months for follow-up.

## 2014-01-15 ENCOUNTER — Other Ambulatory Visit (HOSPITAL_COMMUNITY): Payer: Medicare Other

## 2014-01-15 ENCOUNTER — Ambulatory Visit (HOSPITAL_COMMUNITY)
Admission: RE | Admit: 2014-01-15 | Discharge: 2014-01-15 | Disposition: A | Discharge status: Home / Self Care | Payer: Medicare Other | Source: Ambulatory Visit | Attending: Oncology | Admitting: Oncology

## 2014-01-15 ENCOUNTER — Encounter (HOSPITAL_COMMUNITY): Payer: Self-pay

## 2014-01-15 DIAGNOSIS — K449 Diaphragmatic hernia without obstruction or gangrene: Secondary | ICD-10-CM | POA: Insufficient documentation

## 2014-01-15 DIAGNOSIS — C49A0 Gastrointestinal stromal tumor, unspecified site: Secondary | ICD-10-CM

## 2014-01-15 DIAGNOSIS — D3502 Benign neoplasm of left adrenal gland: Secondary | ICD-10-CM | POA: Insufficient documentation

## 2014-01-15 DIAGNOSIS — I745 Embolism and thrombosis of iliac artery: Secondary | ICD-10-CM | POA: Insufficient documentation

## 2014-01-15 DIAGNOSIS — D481 Neoplasm of uncertain behavior of connective and other soft tissue: Secondary | ICD-10-CM | POA: Insufficient documentation

## 2014-01-15 LAB — POCT I-STAT CREATININE: Creatinine, Ser: 0.8 mg/dL (ref 0.50–1.10)

## 2014-01-15 MED ORDER — IOHEXOL 300 MG/ML  SOLN
100.0000 mL | Freq: Once | INTRAMUSCULAR | Status: AC | PRN
  Administered 2014-01-15: 100 mL via INTRAVENOUS

## 2014-01-16 ENCOUNTER — Encounter (HOSPITAL_COMMUNITY): Payer: Self-pay | Admitting: Oncology

## 2014-01-16 ENCOUNTER — Encounter (HOSPITAL_BASED_OUTPATIENT_CLINIC_OR_DEPARTMENT_OTHER): Payer: Medicare Other

## 2014-01-16 ENCOUNTER — Encounter (HOSPITAL_COMMUNITY): Payer: Medicare Other | Attending: Hematology and Oncology | Admitting: Oncology

## 2014-01-16 VITALS — BP 150/70 | HR 66 | Temp 98.0°F | Resp 18 | Wt 253.4 lb

## 2014-01-16 DIAGNOSIS — Z8509 Personal history of malignant neoplasm of other digestive organs: Secondary | ICD-10-CM

## 2014-01-16 DIAGNOSIS — I82402 Acute embolism and thrombosis of unspecified deep veins of left lower extremity: Secondary | ICD-10-CM

## 2014-01-16 DIAGNOSIS — D509 Iron deficiency anemia, unspecified: Secondary | ICD-10-CM | POA: Diagnosis not present

## 2014-01-16 DIAGNOSIS — D6851 Activated protein C resistance: Secondary | ICD-10-CM

## 2014-01-16 DIAGNOSIS — C49A0 Gastrointestinal stromal tumor, unspecified site: Secondary | ICD-10-CM

## 2014-01-16 DIAGNOSIS — I82409 Acute embolism and thrombosis of unspecified deep veins of unspecified lower extremity: Secondary | ICD-10-CM

## 2014-01-16 DIAGNOSIS — D510 Vitamin B12 deficiency anemia due to intrinsic factor deficiency: Secondary | ICD-10-CM

## 2014-01-16 DIAGNOSIS — Z7901 Long term (current) use of anticoagulants: Secondary | ICD-10-CM

## 2014-01-16 DIAGNOSIS — C499 Malignant neoplasm of connective and soft tissue, unspecified: Secondary | ICD-10-CM | POA: Insufficient documentation

## 2014-01-16 LAB — COMPREHENSIVE METABOLIC PANEL
ALT: 41 U/L — AB (ref 0–35)
AST: 34 U/L (ref 0–37)
Albumin: 3.7 g/dL (ref 3.5–5.2)
Alkaline Phosphatase: 90 U/L (ref 39–117)
Anion gap: 4 — ABNORMAL LOW (ref 5–15)
BUN: 11 mg/dL (ref 6–23)
CO2: 27 mmol/L (ref 19–32)
Calcium: 9.3 mg/dL (ref 8.4–10.5)
Chloride: 110 mEq/L (ref 96–112)
Creatinine, Ser: 0.72 mg/dL (ref 0.50–1.10)
GFR calc Af Amer: >90 mL/min (ref >90)
Glucose, Bld: 86 mg/dL (ref 70–99)
Potassium: 3.9 mmol/L (ref 3.5–5.1)
SODIUM: 141 mmol/L (ref 135–145)
Total Bilirubin: 0.4 mg/dL (ref 0.3–1.2)
Total Protein: 7.7 g/dL (ref 6.0–8.3)

## 2014-01-16 LAB — CBC WITH DIFFERENTIAL/PLATELET
BASOS ABS: 0 10*3/uL (ref 0.0–0.1)
Basophils Relative: 0 % (ref 0–1)
Eosinophils Absolute: 0.1 10*3/uL (ref 0.0–0.7)
Eosinophils Relative: 2 % (ref 0–5)
HEMATOCRIT: 42.7 % (ref 36.0–46.0)
Hemoglobin: 13.4 g/dL (ref 12.0–15.0)
LYMPHS ABS: 2.1 10*3/uL (ref 0.7–4.0)
Lymphocytes Relative: 40 % (ref 12–46)
MCH: 28.5 pg (ref 26.0–34.0)
MCHC: 31.4 g/dL (ref 30.0–36.0)
MCV: 90.9 fL (ref 78.0–100.0)
Monocytes Absolute: 0.6 10*3/uL (ref 0.1–1.0)
Monocytes Relative: 12 % (ref 3–12)
NEUTROS ABS: 2.4 10*3/uL (ref 1.7–7.7)
Neutrophils Relative %: 46 % (ref 43–77)
Platelets: 293 10*3/uL (ref 150–400)
RBC: 4.7 MIL/uL (ref 3.87–5.11)
RDW: 13.7 % (ref 11.5–15.5)
WBC: 5.2 10*3/uL (ref 4.0–10.5)

## 2014-01-16 LAB — PROTIME-INR
INR: 2.57 — ABNORMAL HIGH (ref 0.00–1.49)
Prothrombin Time: 27.8 seconds — ABNORMAL HIGH (ref 11.6–15.2)

## 2014-01-16 LAB — FERRITIN: FERRITIN: 63 ng/mL (ref 10–291)

## 2014-01-16 LAB — IRON AND TIBC
Iron: 83 ug/dL (ref 42–135)
Saturation Ratios: 30 % (ref 20–55)
TIBC: 275 ug/dL (ref 250–470)
UIBC: 192 ug/dL (ref 125–400)

## 2014-01-16 NOTE — Progress Notes (Signed)
Labs for sotran,iron/tibc,ferr,pt,cbcd,cmp

## 2014-01-16 NOTE — Patient Instructions (Signed)
[facility] Discharge Instructions  RECOMMENDATIONS MADE BY THE CONSULTANT AND ANY TEST RESULTS WILL BE SENT TO YOUR REFERRING PHYSICIAN.  Continue Coumadin daily as directed. Your CT scan is negative for any signs of cancer.  I printed and gave you the report to have.  Return in 6 months for follow-up.  Lab Results  Component Value Date   INR 2.57* 01/16/2014   INR 2.73* 12/19/2013   INR 2.50* 11/14/2013    Thank you for choosing [facility] to provide your oncology and hematology care.  To afford each patient quality time with our providers, please arrive at least 15 minutes before your scheduled appointment time.  With your help, our goal is to use those 15 minutes to complete the necessary work-up to ensure our physicians have the information they need to help with your evaluation and healthcare recommendations.    Effective January 1st, 2014, we ask that you re-schedule your appointment with our physicians should you arrive 10 or more minutes late for your appointment.  We strive to give you quality time with our providers, and arriving late affects you and other patients whose appointments are after yours.    Again, thank you for choosing [facility].  Our hope is that these requests will decrease the amount of time that you wait before being seen by our physicians.       _____________________________________________________________  Should you have questions after your visit to [facility], please contact our office at [phone] between the hours of 8:30 a.m. and 5:00 p.m.  Voicemails left after 4:30 p.m. will not be returned until the following business day.  For prescription refill requests, have your pharmacy contact our office with your prescription refill request.

## 2014-01-22 LAB — SOLUBLE TRANSFERRIN RECEPTOR: Transferrin Receptor, Soluble: 1.37 mg/L (ref 0.76–1.76)

## 2014-01-30 ENCOUNTER — Telehealth (HOSPITAL_COMMUNITY): Payer: Self-pay | Admitting: Oncology

## 2014-01-30 NOTE — Telephone Encounter (Signed)
As of 01/30/14 [facility] has not set up/delivered   pt's PT/INR. Pt said they would contact her in about a week with more details.

## 2014-02-14 ENCOUNTER — Other Ambulatory Visit (HOSPITAL_COMMUNITY): Payer: Medicare Other

## 2014-02-20 ENCOUNTER — Other Ambulatory Visit (HOSPITAL_COMMUNITY): Payer: Self-pay | Admitting: Oncology

## 2014-02-20 DIAGNOSIS — D6851 Activated protein C resistance: Secondary | ICD-10-CM

## 2014-02-20 MED ORDER — WARFARIN SODIUM 5 MG PO TABS: 5.0000 mg | ORAL_TABLET | Freq: Every day | ORAL | Status: DC

## 2014-03-06 ENCOUNTER — Encounter: Payer: Self-pay | Admitting: Internal Medicine

## 2014-03-07 ENCOUNTER — Encounter: Payer: Self-pay | Admitting: Internal Medicine

## 2014-03-08 ENCOUNTER — Other Ambulatory Visit (HOSPITAL_COMMUNITY): Payer: Self-pay | Admitting: Oncology

## 2014-04-06 ENCOUNTER — Ambulatory Visit: Payer: Medicare Other | Admitting: Internal Medicine

## 2014-05-16 ENCOUNTER — Ambulatory Visit (INDEPENDENT_AMBULATORY_CARE_PROVIDER_SITE_OTHER): Payer: Medicare Other | Admitting: Internal Medicine

## 2014-05-16 ENCOUNTER — Encounter: Payer: Self-pay | Admitting: Internal Medicine

## 2014-05-16 VITALS — BP 138/70 | HR 57 | Ht 66.5 in | Wt 251.1 lb

## 2014-05-16 DIAGNOSIS — R55 Syncope and collapse: Secondary | ICD-10-CM

## 2014-05-16 DIAGNOSIS — R001 Bradycardia, unspecified: Secondary | ICD-10-CM | POA: Diagnosis not present

## 2014-05-16 DIAGNOSIS — I472 Ventricular tachycardia, unspecified: Secondary | ICD-10-CM

## 2014-05-16 DIAGNOSIS — R635 Abnormal weight gain: Secondary | ICD-10-CM | POA: Diagnosis not present

## 2014-05-16 MED ORDER — METOPROLOL TARTRATE 25 MG PO TABS: 25.0000 mg | ORAL_TABLET | Freq: Every day | ORAL | Status: DC

## 2014-05-16 NOTE — Assessment & Plan Note (Addendum)
She has rare palpitations but has had no recurrent sustained VT. She will continue her current meds but I have asked her to break her metoprolol in half and take a half tablet twice daily.

## 2014-05-16 NOTE — Assessment & Plan Note (Signed)
She remains frustrated by her inability to lose weight. We discussed calories in vs calories burned. She is certain that she is not eating too much. I have suggested she get a fit bit to quantify how much activity she does in a day.

## 2014-05-16 NOTE — Addendum Note (Signed)
Addended by: Levonne Hubert on: 05/16/2014 10:25 AM   Modules accepted: Orders

## 2014-05-16 NOTE — Progress Notes (Signed)
HPI [name] returns today for followup. She is a pleasant middle aged woman with a h/o palpitations after chemotherapy, HTN and obesity. In the interim, she has done well except that she has not been able to lose weight. She has not had any syncope. She has started to exercise but notes that she gets short of breath going up inclines, particularly if she is carrying a heavy load. She would like to lose 90 pounds. She is frustrated by her inability to eat greens because she is on warfarin. Allergies  Allergen Reactions  . Imatinib Other (See Comments)    Cardiac dysrhythmia  . Cephalexin Swelling  . Dexlansoprazole Swelling  . Latex Itching  . Other Swelling    Patient states that Pecans cause her mouth to swell.  Marland Kitchen Penicillins Swelling  . Tape Itching  . Enoxaparin Sodium Itching, Swelling and Palpitations  . Nylon Rash  . Omeprazole Itching and Rash  . Sulfonamide Derivatives Rash     Current Outpatient Prescriptions  Medication Sig Dispense Refill  . acetaminophen (TYLENOL) 325 MG tablet Take 650 mg by mouth every 6 (six) hours as needed. Pain    . cyanocobalamin (,VITAMIN B-12,) 1000 MCG/ML injection Inject 1 mL (1,000 mcg total) into the muscle every 30 (thirty) days. 10 mL 6  . lansoprazole (PREVACID) 15 MG capsule Take 15 mg by mouth daily.     . metoprolol succinate (TOPROL-XL) 25 MG 24 hr tablet Take 25 mg by mouth daily.    Marland Kitchen warfarin (COUMADIN) 2 MG tablet Take 1 mg by mouth daily at 6 PM. Currently takes 7mg /7mg /6mg     . warfarin (COUMADIN) 5 MG tablet Take 1 tablet (5 mg total) by mouth daily. 7mg /7mg /6mg  30 tablet 6  . [DISCONTINUED] diphenhydrAMINE (BENADRYL) 25 mg capsule Take 25 mg by mouth every 6 (six) hours as needed. Itching or allergies     No current facility-administered medications for this visit.     Past Medical History  Diagnosis Date  . Vitamin B12 deficiency     vit b12 1000 mcg monthly  . Cellulitis of left leg 2006  . Ulcer 05/2009   esophageal  . Clotting disorder     heterozygosity from factor v leiden  . Pernicious anemia 07/10/2010  . DVT (deep venous thrombosis) 07/10/2010    on coumadin  . Factor V Leiden   . Anxiety   . 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 Allergic urticaria 01/04/2011    Rash from tape.  Marland Kitchen GERD (gastroesophageal reflux disease)   . Ventricular tachycardia 03/26/11    ROS:   All systems reviewed and negative except as noted in the HPI.   Past Surgical History  Procedure Laterality Date  . Abdominal hysterectomy  1989  . Balloon dilation  12/11/2010    Procedure: BALLOON DILATION;  Surgeon: Rogene Houston, MD;  [facility];  Service: Endoscopy;  Laterality: N/A;  . Laparotomy  01/05/2011    Procedure: EXPLORATORY LAPAROTOMY;  Surgeon: Jamesetta So;  Location: [facility];  Service: General;  Laterality: N/A;  . Bowel resection  01/05/2011    Procedure: SMALL BOWEL RESECTION;  Surgeon: Jamesetta So;  Location: [facility];  Service: General;;  Partial Small Bowel Resection  . Givens capsule study  01/02/2011    Procedure: GIVENS CAPSULE STUDY;  Surgeon: Rogene Houston, MD;  [facility];  Service: Endoscopy;  Laterality: N/A;  . Colonoscopy  02/25/2012    Procedure: COLONOSCOPY;  Surgeon: Rogene Houston, MD;  [facility];  Service: Endoscopy;  Laterality: N/A;  1200     No family history on file.   History   Social History  . Marital Status: Married    Spouse Name: N/A  . Number of Children: N/A  . Years of Education: N/A   Occupational History  . Not on file.   Social History Main Topics  . Smoking status: Never Smoker   . Smokeless tobacco: Never Used  . Alcohol Use: No  . Drug Use: No  . Sexual Activity: Not Currently   Other Topics Concern  . Not on file   Social History Narrative     Ht 5' 6.5" (1.689 m)  Wt 251 lb 2 oz (113.91 kg)  BMI 39.93 kg/m2  Physical Exam:  Well appearing middle aged obese woman, NAD HEENT:  Unremarkable Neck:  6 cm JVD, no thyromegally Back:  No CVA tenderness Lungs:  Clear with no wheezes HEART:  Regular rate rhythm, no murmurs, no rubs, no clicks Abd:  soft, obese, positive bowel sounds, no organomegally, no rebound, no guarding Ext:  2 plus pulses, trace edema, but with venous insufficiency Skin:  No rashes no nodules Neuro:  CN II through XII intact, motor grossly intact  EKG NSR   Assess/Plan:

## 2014-05-16 NOTE — Assessment & Plan Note (Signed)
She is currently asymptomatic. No indication for PPM. Will follow.

## 2014-05-16 NOTE — Patient Instructions (Signed)
Your physician wants you to follow-up in: 1 year with Dr. Lovena Le. You will receive a reminder letter in the mail two months in advance. If you don't receive a letter, please call our office to schedule the follow-up appointment.  Your physician recommends that you continue on your current medications as directed. Please refer to the Current Medication list given to you today.  Take Metoprolol 25 mg 1/2 Tablet Two times Daily  Thank you for choosing [facility]!

## 2014-05-16 NOTE — Addendum Note (Signed)
Addended by: Levonne Hubert on: 05/16/2014 10:23 AM   Modules accepted: Level of Service

## 2014-05-16 NOTE — Assessment & Plan Note (Signed)
She has had no recurrent episodes since we saw her last. She will undergo watchful waiting.

## 2014-05-18 ENCOUNTER — Encounter: Payer: Self-pay | Admitting: Oncology

## 2014-05-24 LAB — POCT INR: INR: 2.8

## 2014-05-29 ENCOUNTER — Other Ambulatory Visit (HOSPITAL_COMMUNITY): Payer: Self-pay | Admitting: Oncology

## 2014-05-29 DIAGNOSIS — D6851 Activated protein C resistance: Secondary | ICD-10-CM

## 2014-05-29 MED ORDER — WARFARIN SODIUM 2 MG PO TABS: 2.0000 mg | ORAL_TABLET | Freq: Every day | ORAL | Status: DC

## 2014-06-11 ENCOUNTER — Telehealth (HOSPITAL_COMMUNITY): Payer: Self-pay

## 2014-06-11 LAB — POCT INR: INR: 4.2

## 2014-06-11 NOTE — Telephone Encounter (Signed)
CRITICAL VALUE ALERT Critical value received:  INR Date of notification:  4.2 Time of notification: 4163  Critical value read back:  Yes.   Nurse who received alert:  Mickie Kay, RN  Reported to Robynn Pane, PA-C.

## 2014-06-11 NOTE — Telephone Encounter (Signed)
Hold Coumadin x 2 days.  Repeat INR in 2 days

## 2014-06-11 NOTE — Telephone Encounter (Signed)
Call back confirmation received.

## 2014-06-11 NOTE — Telephone Encounter (Signed)
Left message on both home and cell telephones and instructed to hold coumadin x 2 days and recheck INR in 2 days.  Call back confirmation requested.

## 2014-06-13 ENCOUNTER — Other Ambulatory Visit (HOSPITAL_COMMUNITY): Payer: Self-pay | Admitting: Oncology

## 2014-06-13 ENCOUNTER — Encounter (HOSPITAL_COMMUNITY): Payer: Self-pay | Admitting: *Deleted

## 2014-06-13 ENCOUNTER — Encounter: Payer: Self-pay | Admitting: Oncology

## 2014-06-13 ENCOUNTER — Telehealth (HOSPITAL_COMMUNITY): Payer: Self-pay | Admitting: *Deleted

## 2014-06-13 NOTE — Progress Notes (Signed)
INR today: 2.4

## 2014-06-13 NOTE — Telephone Encounter (Signed)
Patient notified via vm on home phone and cell phone that she is change coumadin dosing to 6/6/5 and INR in 7 days. I told her it was fine if she called back and let Amy know that she received the message.

## 2014-06-20 LAB — POCT INR: INR: 2.5

## 2014-07-11 ENCOUNTER — Ambulatory Visit (HOSPITAL_COMMUNITY): Payer: Medicare Other | Admitting: Hematology & Oncology

## 2014-07-11 ENCOUNTER — Encounter (HOSPITAL_COMMUNITY): Payer: Medicare Other | Attending: Hematology & Oncology | Admitting: Hematology & Oncology

## 2014-07-11 ENCOUNTER — Encounter (HOSPITAL_COMMUNITY): Payer: Self-pay | Admitting: Hematology & Oncology

## 2014-07-11 VITALS — BP 162/72 | HR 49 | Temp 97.6°F | Resp 18 | Wt 249.0 lb

## 2014-07-11 DIAGNOSIS — I82402 Acute embolism and thrombosis of unspecified deep veins of left lower extremity: Secondary | ICD-10-CM | POA: Diagnosis not present

## 2014-07-11 DIAGNOSIS — I87009 Postthrombotic syndrome without complications of unspecified extremity: Secondary | ICD-10-CM | POA: Diagnosis not present

## 2014-07-11 DIAGNOSIS — Z8509 Personal history of malignant neoplasm of other digestive organs: Secondary | ICD-10-CM

## 2014-07-11 DIAGNOSIS — Z7901 Long term (current) use of anticoagulants: Secondary | ICD-10-CM

## 2014-07-11 DIAGNOSIS — D509 Iron deficiency anemia, unspecified: Secondary | ICD-10-CM

## 2014-07-11 DIAGNOSIS — D6851 Activated protein C resistance: Secondary | ICD-10-CM

## 2014-07-11 DIAGNOSIS — C189 Malignant neoplasm of colon, unspecified: Secondary | ICD-10-CM

## 2014-07-11 DIAGNOSIS — Z139 Encounter for screening, unspecified: Secondary | ICD-10-CM

## 2014-07-11 DIAGNOSIS — C49A0 Gastrointestinal stromal tumor, unspecified site: Secondary | ICD-10-CM

## 2014-07-11 NOTE — Progress Notes (Signed)
Hold Coumadin x 2 days.  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 Chronic anticoagulation with Coumadin for Factor V Leiden.  INTERVAL HISTORY: [name] 64 y.o. female returns for followup of iron deficiency with stage II GIST, no adjuvant treatment due to intolerance of imatinib (cardiac dysrhythmia). He also has a history of factor V Leiden mutation with a left lower extremity DVT and significant post-thrombotic syndrome. She has been maintained on Coumadin and has been advised to take it lifelong.   I personally reviewed and went over laboratory results with the patient.  The results are noted within this dictation.  She denies any oncologic or hematologic complaints and ROS questioning is negative.   She is here alone today.  She talks about disruptions that occur around her home.  They've had multiple disturbances at her door where she has had to call 911.   She checks her INR's at home with monitor. We adjust her Coumadin dosing. She takes iron sulfate 3 days a week. Does B12 shots at home, the 20th of every month  Says she has been working in the yard on her garden.  While doing this, has experienced pain in her left leg; had severe blood clots here, began 28 years ago. She saw a vascular surgeon and nothing could be done.  She has had problems with left lower extremity ulcerations secondary to poor circulation. She has to be very cautious about any form of injury to the left leg. Past Medical History  Diagnosis Date  . Vitamin B12 deficiency     vit b12 1000 mcg monthly  . Cellulitis of left leg 2006  . Ulcer 05/2009    esophageal  . Clotting  disorder     heterozygosity from factor v leiden  . Pernicious anemia 07/10/2010  . DVT (deep venous thrombosis) 07/10/2010    on coumadin  . Factor V Leiden   . Anxiety   . 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 Allergic urticaria 01/04/2011    Rash from tape.  Marland Kitchen GERD (gastroesophageal reflux disease)   . Ventricular tachycardia 03/26/11    has Iron deficiency anemia; Reflux esophagitis; GI BLEEDING; WEIGHT GAIN; CONTUSION, ARM; DVT (deep venous thrombosis); Pernicious anemia; Factor V Leiden; Syncope; Acute blood loss anemia; Chronic ulcer of left leg; Chronic anticoagulation; Abnormal CXR; Lip swelling; GIST (gastrointestinal stromal tumor), malignant; Allergic urticaria; SVT (supraventricular tachycardia); Hypotension, iatrogenic; Ventricular tachycardia; Bradycardia; Colonic cancer; and Abnormal CT scan, colon on her problem list.     is allergic to imatinib; cephalexin; dexlansoprazole; latex; other; penicillins; tape; enoxaparin sodium; nylon; omeprazole; and sulfonamide derivatives.  [name] had no medications administered during this visit.  Past Surgical History  Procedure Laterality Date  . Abdominal hysterectomy  1989  . Balloon dilation  12/11/2010    Procedure: BALLOON DILATION;  Surgeon: Rogene Houston, MD;  [facility];  Service: Endoscopy;  Laterality: N/A;  . Laparotomy  01/05/2011    Procedure: EXPLORATORY LAPAROTOMY;  Surgeon: Jamesetta So;  Location: [facility];  Service: General;  Laterality: N/A;  . Bowel resection  01/05/2011    Procedure: SMALL BOWEL RESECTION;  Surgeon:  Jamesetta So;  Location: [facility];  Service: General;;  Partial Small Bowel Resection  . Givens capsule study  01/02/2011    Procedure: GIVENS CAPSULE STUDY;  Surgeon: Rogene Houston, MD;  [facility];  Service: Endoscopy;  Laterality: N/A;  . Colonoscopy  02/25/2012    Procedure: COLONOSCOPY;  Surgeon: Rogene Houston, MD;  [facility];  Service:  Endoscopy;  Laterality: N/A;  1200    Denies any headaches, dizziness, double vision, fevers, chills, night sweats, nausea, vomiting, diarrhea, constipation, chest pain, heart palpitations, shortness of breath, blood in stool, black tarry stool, urinary pain, urinary burning, urinary frequency, hematuria. Marland Kitchenskkpros   PHYSICAL EXAMINATION  ECOG PERFORMANCE STATUS: 0 - Asymptomatic  Filed Vitals:   07/11/14 1016  BP: 162/72  Pulse: 49  Temp: 97.6 F (36.4 C)  Resp: 18    GENERAL:alert, healthy, no distress, well nourished, well developed, comfortable, cooperative, obese and smiling SKIN: skin color, texture, turgor are normal, no rashes or significant lesions HEAD: Normocephalic, No masses, lesions, tenderness or abnormalities EYES: normal, PERRLA, EOMI, Conjunctiva are pink and non-injected EARS: External ears normal OROPHARYNX:lips, buccal mucosa, and tongue normal and mucous membranes are moist  NECK: supple, no adenopathy, thyroid normal size, non-tender, without nodularity, no stridor, non-tender, trachea midline LYMPH:  no palpable lymphadenopathy BREAST:not examined LUNGS: clear to auscultation  HEART: regular rate & rhythm, no murmurs, no gallops, S1 normal and S2 normal ABDOMEN:abdomen soft, non-tender and normal bowel sounds BACK: Back symmetric, no curvature. EXTREMITIES:less then 2 second capillary refill, no joint deformities, effusion, or inflammation, no edema, no clubbing, no cyanosis significant tiny discoloration of the left lower extremity. Scarring at sites of prior ulcerations. Varicosities in the left posterior calf. NEURO: alert & oriented x 3 with fluent speech, no focal motor/sensory deficits, gait normal   LABORATORY DATA: CBC    Component Value Date/Time   WBC 5.2 01/16/2014 0914   RBC 4.70 01/16/2014 0914   RBC 3.81* 06/07/2009 1230   HGB 13.4 01/16/2014 0914   HCT 42.7 01/16/2014 0914   PLT 293 01/16/2014 0914   MCV 90.9 01/16/2014 0914   MCH  28.5 01/16/2014 0914   MCHC 31.4 01/16/2014 0914   RDW 13.7 01/16/2014 0914   LYMPHSABS 2.1 01/16/2014 0914   MONOABS 0.6 01/16/2014 0914   EOSABS 0.1 01/16/2014 0914   BASOSABS 0.0 01/16/2014 0914      Chemistry      Component Value Date/Time   NA 141 01/16/2014 0914   K 3.9 01/16/2014 0914   CL 110 01/16/2014 0914   CO2 27 01/16/2014 0914   BUN 11 01/16/2014 0914   CREATININE 0.72 01/16/2014 0914      Component Value Date/Time   CALCIUM 9.3 01/16/2014 0914   ALKPHOS 90 01/16/2014 0914   AST 34 01/16/2014 0914   ALT 41* 01/16/2014 0914   BILITOT 0.4 01/16/2014 0914     Lab Results  Component Value Date   INR 4.2 06/11/2014   INR 2.8 05/24/2014   INR 2.57* 01/16/2014     RADIOGRAPHIC STUDIES:  01/15/2014   CLINICAL DATA: Subsequent encounter for GI stromal tumor of the small bowel. Status post resection 01/03/2011.  EXAM: CT ABDOMEN AND PELVIS WITH CONTRAST  TECHNIQUE: Multidetector CT imaging of the abdomen and pelvis was performed using the standard protocol following bolus administration of intravenous contrast.  CONTRAST: 111mL OMNIPAQUE IOHEXOL 300 MG/ML SOLN  COMPARISON: 01/25/2013.  FINDINGS: Lower chest: Unremarkable.  Hepatobiliary: No focal abnormality  within the liver parenchyma. There is no evidence for gallstones, gallbladder wall thickening, or pericholecystic fluid. No intrahepatic or extrahepatic biliary dilation.  Pancreas: No focal mass lesion. No dilatation of the main duct. No intraparenchymal cyst. No peripancreatic edema.  Spleen: No splenomegaly. No focal mass lesion.  Adrenals/Urinary Tract: Right adrenal gland is normal. 2.8 x 2.7 cm left adrenal nodule is unchanged since the prior study and also when comparing back to 10/29/2004 exam, consistent with benign adrenal adenoma. 11 mm low-density lesion in the interpolar right kidney has increased in size since 7 mm on the previous study and 5 mm  from 01/25/2012. This measures near water attenuation and likely represents a cyst. Tiny probable cyst noted in the interpolar left kidney. Bladder is unremarkable.  Stomach/Bowel: Small hiatal hernia noted. Stomach is nondistended. Duodenum is normally positioned as is the ligament of Treitz. Suture line noted in the mid small bowel. Terminal ileum is normal. Appendix is normal. No gross colonic mass. No colonic wall thickening. No substantial diverticular change.  Vascular/Lymphatic: No abdominal aortic aneurysm. The portal vein and superior mesenteric vein are patent. No evidence for lymphadenopathy in the abdomen. There are some superficial varices in the inferior aspect of the anterior abdominal wall. These varices may be due to stenosis or chronic occlusion of the left common iliac vein which is markedly attenuated along with the left external iliac vein.  Reproductive: Uterus is surgically absent. No adnexal mass.  Other: No intraperitoneal free fluid.  Musculoskeletal: Bone windows reveal no worrisome lytic or sclerotic osseous lesions.  IMPRESSION: Stable exam. No evidence for recurrent disease in the abdomen or pelvis.  Stable left adrenal adenoma.  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   Electronically Signed  By: Misty Stanley M.D.  On: 01/15/2014 14:24      ASSESSMENT AND PLAN:  Stage II GIST Intolerance to gleevec Left lower extremity DVT with significant post-thrombotic syndrome Heterozygote for Factor V Leiden mutation History of iron deficiency  Realistically in regards to ongoing Coumadin therapy she could continue discontinuing this given that her DVT was provoked, i.e. postsurgical and that she has what is considered a mild thrombophilia. However her varicosities to increase the potential for re-clotting and continuing therapy is certainly not an unreasonable option. She is not able to afford Xarelto currently.  We will plan  on repeat CT scan of the abdomen and pelvis for additional follow-up of her GI stromal tumor in December. If her scan is normal we will discuss how to proceed with ongoing observation.  She takes iron sulfate 3 times weekly. She is unable to tolerate it more often. We will recheck her iron levels at her next follow-up.  She is overdue for screening mammography and we have arranged for a mammogram in the next several weeks.  Orders Placed This Encounter  Procedures  . MM Digital Screening    Standing Status: Future     Number of Occurrences:      Standing Expiration Date: 07/11/2015    Order Specific Question:  Reason for Exam (SYMPTOM  OR DIAGNOSIS REQUIRED)    Answer:  screening    Order Specific Question:  Preferred imaging location?    Answer:  [facility]  . CT Abdomen Pelvis W Contrast    Standing Status: Future     Number of Occurrences:      Standing Expiration Date: 07/11/2015    Order Specific Question:  Reason for Exam (SYMPTOM  OR DIAGNOSIS REQUIRED)  Answer:  GIST, re-staging    Order Specific Question:  Preferred imaging location?    Answer:  [facility]  . CBC with Differential    Standing Status: Future     Number of Occurrences:      Standing Expiration Date: 07/11/2015  . Comprehensive metabolic panel    Standing Status: Future     Number of Occurrences:      Standing Expiration Date: 07/11/2015  . Ferritin    Standing Status: Future     Number of Occurrences:      Standing Expiration Date: 07/11/2015   All questions were answered. The patient knows to call the clinic with any problems, questions or concerns. We can certainly see the patient much sooner if necessary.   This document serves as a record of services personally performed by Ancil Linsey, MD. It was created on her behalf by Janace Hoard, a trained medical scribe. The creation of this record is based on the scribe's personal observations and the provider's statements to them. This  document has been checked and approved by the attending provider.  I have reviewed the above documentation for accuracy and completeness, and I agree with the above. This note was electronically signed.  Kelby Fam. Whitney Muse, MD

## 2014-07-11 NOTE — Patient Instructions (Signed)
..  [place] at [facility] Discharge Instructions  RECOMMENDATIONS MADE BY THE CONSULTANT AND ANY TEST RESULTS WILL BE SENT TO YOUR REFERRING PHYSICIAN.  Return in 6 months  CT scan in 6 months  Thank you for choosing [place] at [facility] to provide your oncology and hematology care.  To afford each patient quality time with our provider, please arrive at least 15 minutes before your scheduled appointment time.    You need to re-schedule your appointment should you arrive 10 or more minutes late.  We strive to give you quality time with our providers, and arriving late affects you and other patients whose appointments are after yours.  Also, if you no show three or more times for appointments you may be dismissed from the clinic at the providers discretion.     Again, thank you for choosing [facility].  Our hope is that these requests will decrease the amount of time that you wait before being seen by our physicians.       _____________________________________________________________  Should you have questions after your visit to [facility], please contact our office at [phone] between the hours of 8:30 a.m. and 4:30 p.m.  Voicemails left after 4:30 p.m. will not be returned until the following business day.  For prescription refill requests, have your pharmacy contact our office.

## 2014-07-16 ENCOUNTER — Encounter: Payer: Self-pay | Admitting: Oncology

## 2014-07-18 ENCOUNTER — Encounter (HOSPITAL_COMMUNITY): Payer: Self-pay | Admitting: *Deleted

## 2014-07-18 LAB — POCT INR: INR: 3.7

## 2014-07-18 NOTE — Progress Notes (Signed)
INR =3.7 on 07/18/2014 Patient on coumadin 6/6/5

## 2014-07-19 ENCOUNTER — Other Ambulatory Visit (HOSPITAL_COMMUNITY): Payer: Self-pay | Admitting: Oncology

## 2014-07-19 DIAGNOSIS — I82402 Acute embolism and thrombosis of unspecified deep veins of left lower extremity: Secondary | ICD-10-CM

## 2014-07-20 ENCOUNTER — Ambulatory Visit (HOSPITAL_COMMUNITY)
Admission: RE | Admit: 2014-07-20 | Discharge: 2014-07-20 | Disposition: A | Discharge status: Home / Self Care | Payer: Medicare Other | Source: Ambulatory Visit | Attending: Oncology | Admitting: Oncology

## 2014-07-20 DIAGNOSIS — I82432 Acute embolism and thrombosis of left popliteal vein: Secondary | ICD-10-CM | POA: Insufficient documentation

## 2014-07-20 DIAGNOSIS — M79605 Pain in left leg: Secondary | ICD-10-CM | POA: Insufficient documentation

## 2014-07-20 DIAGNOSIS — Z85038 Personal history of other malignant neoplasm of large intestine: Secondary | ICD-10-CM | POA: Diagnosis not present

## 2014-07-20 DIAGNOSIS — I82412 Acute embolism and thrombosis of left femoral vein: Secondary | ICD-10-CM | POA: Insufficient documentation

## 2014-07-20 DIAGNOSIS — M7989 Other specified soft tissue disorders: Secondary | ICD-10-CM | POA: Insufficient documentation

## 2014-07-20 DIAGNOSIS — I82402 Acute embolism and thrombosis of unspecified deep veins of left lower extremity: Secondary | ICD-10-CM

## 2014-07-25 LAB — POCT INR: INR: 2.2

## 2014-07-26 ENCOUNTER — Ambulatory Visit (HOSPITAL_COMMUNITY)
Admission: RE | Admit: 2014-07-26 | Discharge: 2014-07-26 | Disposition: A | Discharge status: Home / Self Care | Payer: Medicare Other | Source: Ambulatory Visit | Attending: Hematology & Oncology | Admitting: Hematology & Oncology

## 2014-07-26 DIAGNOSIS — C49A0 Gastrointestinal stromal tumor, unspecified site: Secondary | ICD-10-CM

## 2014-07-26 DIAGNOSIS — C189 Malignant neoplasm of colon, unspecified: Secondary | ICD-10-CM

## 2014-07-26 DIAGNOSIS — Z1231 Encounter for screening mammogram for malignant neoplasm of breast: Secondary | ICD-10-CM | POA: Insufficient documentation

## 2014-07-26 DIAGNOSIS — Z139 Encounter for screening, unspecified: Secondary | ICD-10-CM

## 2014-08-01 LAB — PROTIME-INR: INR: 3.2 — AB (ref 0.9–1.1)

## 2014-08-08 ENCOUNTER — Encounter (HOSPITAL_COMMUNITY): Payer: Self-pay | Admitting: Emergency Medicine

## 2014-08-08 ENCOUNTER — Emergency Department (HOSPITAL_COMMUNITY)
Admission: EM | Admit: 2014-08-08 | Discharge: 2014-08-08 | Disposition: A | Discharge status: Home / Self Care | Payer: Medicare Other | Attending: Emergency Medicine | Admitting: Emergency Medicine

## 2014-08-08 ENCOUNTER — Emergency Department (HOSPITAL_COMMUNITY): Payer: Medicare Other

## 2014-08-08 DIAGNOSIS — F419 Anxiety disorder, unspecified: Secondary | ICD-10-CM | POA: Diagnosis not present

## 2014-08-08 DIAGNOSIS — Z872 Personal history of diseases of the skin and subcutaneous tissue: Secondary | ICD-10-CM | POA: Insufficient documentation

## 2014-08-08 DIAGNOSIS — S8992XA Unspecified injury of left lower leg, initial encounter: Secondary | ICD-10-CM | POA: Insufficient documentation

## 2014-08-08 DIAGNOSIS — Y9301 Activity, walking, marching and hiking: Secondary | ICD-10-CM | POA: Insufficient documentation

## 2014-08-08 DIAGNOSIS — X58XXXA Exposure to other specified factors, initial encounter: Secondary | ICD-10-CM | POA: Diagnosis not present

## 2014-08-08 DIAGNOSIS — Y9289 Other specified places as the place of occurrence of the external cause: Secondary | ICD-10-CM | POA: Diagnosis not present

## 2014-08-08 DIAGNOSIS — K219 Gastro-esophageal reflux disease without esophagitis: Secondary | ICD-10-CM | POA: Diagnosis not present

## 2014-08-08 DIAGNOSIS — D510 Vitamin B12 deficiency anemia due to intrinsic factor deficiency: Secondary | ICD-10-CM | POA: Insufficient documentation

## 2014-08-08 DIAGNOSIS — Z79899 Other long term (current) drug therapy: Secondary | ICD-10-CM | POA: Diagnosis not present

## 2014-08-08 DIAGNOSIS — Y998 Other external cause status: Secondary | ICD-10-CM | POA: Diagnosis not present

## 2014-08-08 DIAGNOSIS — Z9889 Other specified postprocedural states: Secondary | ICD-10-CM | POA: Insufficient documentation

## 2014-08-08 DIAGNOSIS — Z86718 Personal history of other venous thrombosis and embolism: Secondary | ICD-10-CM | POA: Diagnosis not present

## 2014-08-08 DIAGNOSIS — Z91040 Latex allergy status: Secondary | ICD-10-CM | POA: Insufficient documentation

## 2014-08-08 DIAGNOSIS — I825Z2 Chronic embolism and thrombosis of unspecified deep veins of left distal lower extremity: Secondary | ICD-10-CM | POA: Diagnosis not present

## 2014-08-08 DIAGNOSIS — Z880 Allergy status to penicillin: Secondary | ICD-10-CM | POA: Insufficient documentation

## 2014-08-08 DIAGNOSIS — Z7901 Long term (current) use of anticoagulants: Secondary | ICD-10-CM | POA: Insufficient documentation

## 2014-08-08 DIAGNOSIS — M25562 Pain in left knee: Secondary | ICD-10-CM

## 2014-08-08 DIAGNOSIS — R600 Localized edema: Secondary | ICD-10-CM

## 2014-08-08 LAB — CBC WITH DIFFERENTIAL/PLATELET
BASOS ABS: 0 10*3/uL (ref 0.0–0.1)
Basophils Relative: 0 % (ref 0–1)
Eosinophils Absolute: 0.1 10*3/uL (ref 0.0–0.7)
Eosinophils Relative: 3 % (ref 0–5)
HEMATOCRIT: 45.7 % (ref 36.0–46.0)
HEMOGLOBIN: 14.5 g/dL (ref 12.0–15.0)
Lymphocytes Relative: 38 % (ref 12–46)
Lymphs Abs: 2.1 10*3/uL (ref 0.7–4.0)
MCH: 28.4 pg (ref 26.0–34.0)
MCHC: 31.7 g/dL (ref 30.0–36.0)
MCV: 89.4 fL (ref 78.0–100.0)
MONO ABS: 0.6 10*3/uL (ref 0.1–1.0)
Monocytes Relative: 11 % (ref 3–12)
NEUTROS ABS: 2.6 10*3/uL (ref 1.7–7.7)
NEUTROS PCT: 48 % (ref 43–77)
Platelets: 272 10*3/uL (ref 150–400)
RBC: 5.11 MIL/uL (ref 3.87–5.11)
RDW: 13.9 % (ref 11.5–15.5)
WBC: 5.4 10*3/uL (ref 4.0–10.5)

## 2014-08-08 LAB — BASIC METABOLIC PANEL
ANION GAP: 8 (ref 5–15)
BUN: 14 mg/dL (ref 6–20)
CALCIUM: 9.3 mg/dL (ref 8.9–10.3)
CHLORIDE: 106 mmol/L (ref 101–111)
CO2: 26 mmol/L (ref 22–32)
Creatinine, Ser: 0.68 mg/dL (ref 0.44–1.00)
GFR calc non Af Amer: >60 mL/min (ref >60)
Glucose, Bld: 100 mg/dL — ABNORMAL HIGH (ref 65–99)
POTASSIUM: 4.4 mmol/L (ref 3.5–5.1)
SODIUM: 140 mmol/L (ref 135–145)

## 2014-08-08 LAB — PROTIME-INR
INR: 2.06 — ABNORMAL HIGH (ref 0.00–1.49)
Prothrombin Time: 23.1 seconds — ABNORMAL HIGH (ref 11.6–15.2)

## 2014-08-08 MED ORDER — TRAMADOL HCL 50 MG PO TABS: 50.0000 mg | ORAL_TABLET | Freq: Four times a day (QID) | ORAL | Status: DC | PRN

## 2014-08-08 MED ORDER — FENTANYL CITRATE (PF) 100 MCG/2ML IJ SOLN
50.0000 ug | Freq: Once | INTRAMUSCULAR | Status: DC
  Filled 2014-08-08: qty 2

## 2014-08-08 MED ORDER — FENTANYL CITRATE (PF) 100 MCG/2ML IJ SOLN
50.0000 ug | Freq: Once | INTRAMUSCULAR | Status: AC
Start: 2014-08-08 — End: 2014-08-08
  Administered 2014-08-08: 50 ug via INTRAVENOUS

## 2014-08-08 NOTE — ED Notes (Signed)
Pt made aware to return if symptoms worsen or if any life threatening symptoms occur.   

## 2014-08-08 NOTE — Discharge Instructions (Signed)
As discussed, your evaluation today has been largely reassuring.  But, it is important that you monitor your condition carefully, and do not hesitate to return to the ED if you develop new, or concerning changes in your condition.  You are leg discomfort is likely due to disruption of cartilage in your knee, less likely due to strain of the gastrocnemius or calf muscle.  Please follow-up with your physicians for appropriate ongoing care.

## 2014-08-08 NOTE — ED Provider Notes (Signed)
CSN: [id number]     Arrival date & time 08/08/14  1046 History   First MD Initiated Contact with Patient 08/08/14 1411     Chief Complaint  Patient presents with  . Knee Pain    HPI  Patient presents with concern of pain in the left lower extremity. Patient has a history of recurrent DVT in the calf. However, the patient felt sudden onset of different pain approximately 12 hours ago, while walking. Since that time pain has been focally in the popliteal fossa, with mild radiation on the medial inferior aspect. Pain is worse with ambulation, minimally better at rest. There is no new chest pain, dyspnea, distal loss of sensation, weakness.   Past Medical History  Diagnosis Date  . Vitamin B12 deficiency     vit b12 1000 mcg monthly  . Cellulitis of left leg 2006  . Ulcer 05/2009    esophageal  . Clotting disorder     heterozygosity from factor v leiden  . Pernicious anemia 07/10/2010  . DVT (deep venous thrombosis) 07/10/2010    on coumadin  . Factor V Leiden   . Anxiety   . 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 Allergic urticaria 01/04/2011    Rash from tape.  Marland Kitchen GERD (gastroesophageal reflux disease)   . Ventricular tachycardia 03/26/11   Past Surgical History  Procedure Laterality Date  . Abdominal hysterectomy  1989  . Balloon dilation  12/11/2010    Procedure: BALLOON DILATION;  Surgeon: Rogene Houston, MD;  [facility];  Service: Endoscopy;  Laterality: N/A;  . Laparotomy  01/05/2011    Procedure: EXPLORATORY LAPAROTOMY;  Surgeon: Jamesetta So;  Location: [facility];  Service: General;  Laterality: N/A;  . Bowel resection  01/05/2011    Procedure: SMALL BOWEL RESECTION;  Surgeon: Jamesetta So;  Location: [facility];  Service: General;;  Partial Small Bowel Resection  . Givens capsule study  01/02/2011    Procedure: GIVENS CAPSULE STUDY;  Surgeon: Rogene Houston, MD;  [facility];  Service: Endoscopy;  Laterality: N/A;  . Colonoscopy  02/25/2012     Procedure: COLONOSCOPY;  Surgeon: Rogene Houston, MD;  [facility];  Service: Endoscopy;  Laterality: N/A;  1200   History reviewed. No pertinent family history. History  Substance Use Topics  . Smoking status: Never Smoker   . Smokeless tobacco: Never Used  . Alcohol Use: No   OB History    No data available     Review of Systems  Constitutional:       Per HPI, otherwise negative  HENT:       Per HPI, otherwise negative  Respiratory:       Per HPI, otherwise negative  Cardiovascular:       Per HPI, otherwise negative  Gastrointestinal: Negative for vomiting.  Endocrine:       Negative aside from HPI  Genitourinary:       Neg aside from HPI   Musculoskeletal:       Per HPI, otherwise negative  Skin: Negative.   Allergic/Immunologic: Negative for immunocompromised state.  Neurological: Negative for syncope and weakness.  Hematological: Bruises/bleeds easily.      Allergies  Imatinib; Cephalexin; Dexlansoprazole; Latex; Other; Penicillins; Tape; Enoxaparin sodium; Nylon; Omeprazole; and Sulfonamide derivatives  Home Medications   Prior to Admission medications   Medication Sig Start Date End Date Taking? Authorizing Provider  cyanocobalamin (,VITAMIN B-12,) 1000 MCG/ML injection Inject 1  mL (1,000 mcg total) into the muscle every 30 (thirty) days. 02/01/13  Yes Manon Hilding Kefalas, PA-C  lansoprazole (PREVACID) 15 MG capsule Take 15 mg by mouth daily.    Yes Historical Provider, MD  metoprolol tartrate (LOPRESSOR) 25 MG tablet Take 1 tablet (25 mg total) by mouth daily. Take 1/2 Tablet Two times Daily Patient taking differently: Take 12.5 mg by mouth 2 (two) times daily.  05/16/14  Yes Evans Lance, MD  warfarin (COUMADIN) 5 MG tablet Take 1 tablet (5 mg total) by mouth daily. 7mg /7mg /6mg  Patient taking differently: Take 5 mg by mouth daily.  02/20/14  Yes Baird Cancer, PA-C  acetaminophen (TYLENOL) 325 MG tablet Take 650 mg by mouth every 6 (six) hours  as needed. Pain    Historical Provider, MD  traMADol (ULTRAM) 50 MG tablet Take 1 tablet (50 mg total) by mouth every 6 (six) hours as needed. 08/08/14   Carmin Muskrat, MD  warfarin (COUMADIN) 2 MG tablet Take 1 tablet (2 mg total) by mouth daily at 6 PM. Currently takes 7mg /7mg /6mg  Patient not taking: Reported on 08/08/2014 05/29/14   Manon Hilding Kefalas, PA-C   BP 165/81 mmHg  Pulse 58  Temp(Src) 98 F (36.7 C) (Oral)  Resp 18  Ht 5' 7.5" (1.715 m)  Wt 228 lb (103.42 kg)  BMI 35.16 kg/m2  SpO2 99% Physical Exam  Constitutional: She is oriented to person, place, and time. She appears well-developed and well-nourished. No distress.  HENT:  Head: Normocephalic and atraumatic.  Eyes: Conjunctivae and EOM are normal.  Cardiovascular: Normal rate and regular rhythm.   Pulmonary/Chest: Effort normal and breath sounds normal. No stridor. No respiratory distress.  Abdominal: She exhibits no distension.  Musculoskeletal: She exhibits no edema.       Left knee: She exhibits bony tenderness. She exhibits normal range of motion, no effusion, no ecchymosis, no deformity, no laceration, no erythema, normal alignment, no LCL laxity, normal patellar mobility and no MCL laxity. Tenderness found.       Left ankle: Normal.       Legs: Neurological: She is alert and oriented to person, place, and time. No cranial nerve deficit.  Skin: Skin is warm and dry.  Psychiatric: She has a normal mood and affect.  Nursing note and vitals reviewed.   ED Course  Procedures (including critical care time) Labs Review Labs Reviewed  BASIC METABOLIC PANEL - Abnormal; Notable for the following:    Glucose, Bld 100 (*)    All other components within normal limits  PROTIME-INR - Abnormal; Notable for the following:    Prothrombin Time 23.1 (*)    INR 2.06 (*)    All other components within normal limits  CBC WITH DIFFERENTIAL/PLATELET    Imaging Review US Venous Img Lower Unilateral Left  08/08/2014   CLINICAL  DATA:  Chronic left lower extremity pain.  EXAM: Left LOWER EXTREMITY VENOUS DOPPLER ULTRASOUND  TECHNIQUE: Gray-scale sonography with graded compression, as well as color Doppler and duplex ultrasound were performed to evaluate the lower extremity deep venous systems from the level of the common femoral vein and including the common femoral, femoral, profunda femoral, popliteal and calf veins including the posterior tibial, peroneal and gastrocnemius veins when visible. The superficial great saphenous vein was also interrogated. Spectral Doppler was utilized to evaluate flow at rest and with distal augmentation maneuvers in the common femoral, femoral and popliteal veins.  COMPARISON:  Ultrasound of July 20, 2014.  FINDINGS: Contralateral Common Femoral Vein:  Respiratory phasicity is normal and symmetric with the symptomatic side. No evidence of thrombus. Normal compressibility.  Common Femoral Vein: No evidence of thrombus. Normal compressibility, respiratory phasicity and response to augmentation.  Saphenofemoral Junction: No evidence of thrombus. Normal compressibility and flow on color Doppler imaging.  Profunda Femoral Vein: No evidence of thrombus. Normal compressibility and flow on color Doppler imaging.  Femoral Vein: Continues to be atretic distally, most consistent with sequela of previous deep venous thrombosis.  Popliteal Vein: Stable occlusive thrombus is noted which most likely represents chronic deep venous thrombosis.  Calf Veins: Peroneal veins are not visualized. Posterior tibial veins demonstrate normal compressibility and phasicity without evidence of thrombus.  Superficial Great Saphenous Vein: No evidence of thrombus. Normal compressibility and flow on color Doppler imaging.  Venous Reflux:  None.  Other Findings: Superficial varicosities are noted in the left calf and ankle.  IMPRESSION: Left femoral vein remains atretic most likely representing sequela of prior deep venous thrombosis. Stable  chronic deep venous thrombosis is noted within the left popliteal vein. No new thrombus is noted. Superficial varicosities are again noted in the left ankle. There is no significant change compared to prior exam   Electronically Signed   By: Marijo Conception, M.D.   On: 08/08/2014 15:59   Dg Knee Complete 4 Views Left  08/08/2014   CLINICAL DATA:  64 year old female with left calf pain and popping sensation in the left knee.  EXAM: LEFT KNEE - COMPLETE 4+ VIEW  COMPARISON:  Prior radiographs of the left knee 05/01/2004  FINDINGS: There is no evidence of fracture, dislocation, or joint effusion. There is no evidence of arthropathy or other focal bone abnormality. Soft tissues are unremarkable.  IMPRESSION: Negative.   Electronically Signed   By: Jacqulynn Cadet M.D.   On: 08/08/2014 16:09    On repeat exam the patient is stable condition, states that she feels better.   Evaluation after the patient had Ace wrap applied shows good tolerance, no new questions.  MDM   Final diagnoses:  Edema of left lower extremity  Left knee pain  DVT, lower extremity, distal, chronic, left   Patient presents with concern of different discomfort from her typical pain in her left lower extremity. Patient has factor V Leiden deficiency, is anticoagulated dependent. With different pain, patient did have ultrasound, in addition to x-ray, though the patient's pain seems likely due to either cartilaginous disruption, or muscle strain, proximal gastric venous. Patient had pain reduction here. Patient tolerated partial immobilization, crutches, was discharged in stable condition with orthopedic, primary care follow-up.  Carmin Muskrat, MD 08/08/14 [phone]

## 2014-08-08 NOTE — ED Notes (Signed)
Pt reports left calf pain since walking this am and reports heard a "pop" in left knee. Pt reports was seen a few weeks ago for same and told "it was an old blood clot." pt denies any new discoloration,swelling. Distal pulses strong intact. Cap refill<3 secs. Pt denies any known injury. Pt reports is on warfarin.

## 2014-08-13 ENCOUNTER — Encounter: Payer: Self-pay | Admitting: Oncology

## 2014-08-15 LAB — PROTIME-INR: INR: 2.2 — AB (ref 0.9–1.1)

## 2014-09-03 ENCOUNTER — Ambulatory Visit: Payer: Medicare Other | Admitting: Orthopedic Surgery

## 2014-09-04 ENCOUNTER — Other Ambulatory Visit (HOSPITAL_COMMUNITY): Payer: Self-pay | Admitting: Oncology

## 2014-09-04 DIAGNOSIS — D6851 Activated protein C resistance: Secondary | ICD-10-CM

## 2014-09-04 MED ORDER — WARFARIN SODIUM 5 MG PO TABS
5.0000 mg | ORAL_TABLET | Freq: Every day | ORAL | Status: DC
Start: 2014-09-04 — End: 2015-09-06

## 2014-09-07 ENCOUNTER — Encounter: Payer: Self-pay | Admitting: Oncology

## 2014-09-18 LAB — PROTIME-INR: INR: 2.8 — AB (ref 0.9–1.1)

## 2014-09-28 ENCOUNTER — Ambulatory Visit (HOSPITAL_COMMUNITY): Payer: Medicare Other

## 2014-10-24 LAB — PROTIME-INR

## 2014-11-19 ENCOUNTER — Ambulatory Visit (INDEPENDENT_AMBULATORY_CARE_PROVIDER_SITE_OTHER): Payer: Medicare Other | Admitting: Pediatrics

## 2014-11-19 ENCOUNTER — Encounter: Payer: Self-pay | Admitting: Pediatrics

## 2014-11-19 VITALS — BP 134/73 | HR 53 | Temp 97.1°F | Ht 67.5 in | Wt 247.6 lb

## 2014-11-19 DIAGNOSIS — R739 Hyperglycemia, unspecified: Secondary | ICD-10-CM

## 2014-11-19 DIAGNOSIS — F32A Depression, unspecified: Secondary | ICD-10-CM | POA: Insufficient documentation

## 2014-11-19 DIAGNOSIS — F329 Major depressive disorder, single episode, unspecified: Secondary | ICD-10-CM | POA: Diagnosis not present

## 2014-11-19 DIAGNOSIS — Z0000 Encounter for general adult medical examination without abnormal findings: Secondary | ICD-10-CM

## 2014-11-19 MED ORDER — CITALOPRAM HYDROBROMIDE 20 MG PO TABS: 20.0000 mg | ORAL_TABLET | Freq: Every day | ORAL | Status: DC

## 2014-11-19 NOTE — Patient Instructions (Signed)
24 hour a day CRISIS NUMBER: [id number]   List of local counseling services:  [facility]- Therapist [address] [place] ,[place] [address] [phone] Children limited to anxiety and depression- NO ADD/ADHD Does not accept Medicaid  [facility] [address] [place], [place] Does see children Does accept medicaid Will assess for Autism but not treat  [facility]. [address] [place] [phone] Does see children  Does accept Medicaid Medication management- substance abuse- bipolar- grief- family-marriage- OCD- Anxiety- PTSD  [facility] [place] Does see children Does accept medicaid They do perform psychological testing  [place] [address] [place] Schedule through [place]. [phone] Patient must call and make own appointment Does se children Does accept Medicaid  [facility] [place], [place] 7-10 accompanied by an adult, 11 and up by themselves Does accept Medicaid Will see patients with- substance abuse-ADHD-ADD-Bipolar-Domestic violence-Marriage counseling- Family Counseling and sexual abuse  [place] and Psychiatrist [address] [place] [phone] Does see children Does accept [facility] [place] [phone]  Dr. Sabra Heck-  Psychiatrist [address], [place] Specializes in ADHD and addictions They do ADHD testing Suboxone clinic  [facility] [address] [place] Does Child psychological testing  [facility] [address] Dr. Dellia [place] [phone] Does Accept Medicaid Evaluates for Autism  [facility] [place] [phone] Does Not accept Medicaid Does do adult ADD evaluations  Dr. Lorenza Evangelist [address] [place] [phone] Does not Take Medicaid Sees ADD and ADHD for treatment   [facility] [address] [place],  [address] [phone] Takes Medicaid WIll see children as young as 90

## 2014-11-19 NOTE — Progress Notes (Signed)
Subjective:   [name] is a 64 y.o. female who presents for a Medicare Annual Wellness Visit.  HPI:  Feeling depressed. Poor sleep. Has tried tylenol PM. On warfarin for Factor V Leiden.  Review of Systems  All systems negative other than what is in the HPI  Current Medications (verified) Outpatient Encounter Prescriptions as of 11/19/2014  Medication Sig  . acetaminophen (TYLENOL) 325 MG tablet Take 650 mg by mouth every 6 (six) hours as needed. Pain  . cyanocobalamin (,VITAMIN B-12,) 1000 MCG/ML injection Inject 1 mL (1,000 mcg total) into the muscle every 30 (thirty) days.  . lansoprazole (PREVACID) 15 MG capsule Take 15 mg by mouth daily.   . metoprolol tartrate (LOPRESSOR) 25 MG tablet Take 1 tablet (25 mg total) by mouth daily. Take 1/2 Tablet Two times Daily (Patient taking differently: Take 12.5 mg by mouth 2 (two) times daily. )  . traMADol (ULTRAM) 50 MG tablet Take 1 tablet (50 mg total) by mouth every 6 (six) hours as needed.  . warfarin (COUMADIN) 2 MG tablet Take 1 tablet (2 mg total) by mouth daily at 6 PM. Currently takes 7mg /7mg /6mg   . warfarin (COUMADIN) 5 MG tablet Take 1 tablet (5 mg total) by mouth daily.   No facility-administered encounter medications on file as of 11/19/2014.    Allergies (verified) Imatinib; Cephalexin; Dexlansoprazole; Latex; Other; Penicillins; Tape; Enoxaparin sodium; Nylon; Omeprazole; and Sulfonamide derivatives   History: Past Medical History  Diagnosis Date  . Vitamin B12 deficiency     vit b12 1000 mcg monthly  . Cellulitis of left leg 2006  . Ulcer 05/2009    esophageal  . Clotting disorder ([place])     heterozygosity from factor v leiden  . Pernicious anemia 07/10/2010  . DVT (deep venous thrombosis) ([place]) 07/10/2010    on coumadin  . Factor V Leiden ([place])   . Anxiety   . 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 Allergic urticaria 01/04/2011    Rash from tape.  Marland Kitchen GERD (gastroesophageal reflux disease)   .  Ventricular tachycardia ([place]) 03/26/11   Past Surgical History  Procedure Laterality Date  . Abdominal hysterectomy  1989  . Balloon dilation  12/11/2010    Procedure: BALLOON DILATION;  Surgeon: Rogene Houston, MD;  [facility];  Service: Endoscopy;  Laterality: N/A;  . Laparotomy  01/05/2011    Procedure: EXPLORATORY LAPAROTOMY;  Surgeon: Jamesetta So;  Location: [facility];  Service: General;  Laterality: N/A;  . Bowel resection  01/05/2011    Procedure: SMALL BOWEL RESECTION;  Surgeon: Jamesetta So;  Location[facility];  Service: General;;  Partial Small Bowel Resection  . Givens capsule study  01/02/2011    Procedure: GIVENS CAPSULE STUDY;  Surgeon: Rogene Houston, MD;  [facility];  Service: Endoscopy;  Laterality: N/A;  . Colonoscopy  02/25/2012    Procedure: COLONOSCOPY;  Surgeon: Rogene Houston, MD;  [facility];  Service: Endoscopy;  Laterality: N/A;  1200   History reviewed. No pertinent family history. Social History   Occupational History  . Not on file.   Social History Main Topics  . Smoking status: Never Smoker   . Smokeless tobacco: Never Used  . Alcohol Use: No  . Drug Use: No  . Sexual Activity: Not Currently    Do you feel safe at home?  Yes  Dietary issues and exercise activities: Current Exercise Habits:: Home exercise routine, Type of  exercise: walking, Time (Minutes): 20, Frequency (Times/Week): > 6, Weekly Exercise (Minutes/Week): 0  Current Dietary habits:  Some fruits and vegetables   Objective:    Today's Vitals   11/19/14 0953  BP: 134/73  Pulse: 53  Temp: 97.1 F (36.2 C)  TempSrc: Oral  Height: 5' 7.5" (1.715 m)  Weight: 247 lb 9.6 oz (112.311 kg)   Body mass index is 38.19 kg/(m^2).  Gen: NAD, alert, cooperative with exam, NCAT, tearful at times EYES: EOMI, no scleral injection or icterus ENT: no erythema OP CV: NRRR, normal S1/S2, no murmur, WWP Resp: CTABL, no wheezes, normal WOB Abd: soft,  NTND. Neuro: Alert and oriented    Activities of Daily Living In your present state of health, do you have any difficulty performing the following activities: 11/19/2014  Hearing? N  Vision? Y  Difficulty concentrating or making decisions? Y  Walking or climbing stairs? N  Dressing or bathing? N  Doing errands, shopping? N  Preparing Food and eating ? N  Using the Toilet? N  In the past six months, have you accidently leaked urine? Y  Do you have problems with loss of bowel control? N  Managing your Medications? N  Managing your Finances? N  Housekeeping or managing your Housekeeping? N    Are there smokers in your home (other than you)? No      Depression Screen PHQ 2/9 Scores 11/19/2014 11/19/2014 11/19/2014 11/22/2013  PHQ - 2 Score 4 4 0 0  PHQ- 9 Score 12 - - -    Fall Risk Fall Risk  11/19/2014 11/19/2014 11/19/2014 01/16/2014 11/22/2013  Falls in the past year? No No No No No    Cognitive Function: MMSE - Mini Mental State Exam 11/19/2014  Orientation to time 5  Orientation to Place 5  Registration 3  Attention/ Calculation 5  Recall 3  Language- name 2 objects 2  Language- repeat 1  Language- follow 3 step command 3  Language- read & follow direction 1  Write a sentence 1  Copy design 1  Total score 30    Immunizations and Health Maintenance  There is no immunization history on file for this patient. Health Maintenance Due  Topic Date Due  . Hepatitis C Screening  1950/12/06  . HIV Screening  04/16/1966    Patient Care Team: Chipper Herb, MD as PCP - General (Family Medicine)  Indicate any recent Medical Services you may have received from other than [facility] providers in the past year (date may be approximate).    Assessment:    Annual Wellness Visit    Screening Tests Health Maintenance  Topic Date Due  . Hepatitis C Screening  26-Jan-1951  . HIV Screening  04/16/1966  . INFLUENZA VACCINE  04/26/2015 (Originally 08/27/2014)  . PAP  SMEAR  04/26/2015 (Originally 04/15/1972)  . ZOSTAVAX  04/26/2015 (Originally 04/16/2011)  . MAMMOGRAM  07/25/2016  . TETANUS/TDAP  11/23/2019  . COLONOSCOPY  02/24/2022        Plan:   During the course of the visit Skiler was educated and counseled about the following appropriate screening and preventive services:   Vaccines--declined flu, shingles shot.  Colorectal cancer screening--H/o GIST tumor at small intestine/large intestine junction per pt. Found with GI bleeding.  Cardiovascular disease screening--lipid panel today  Diabetes screening--HgA1c today  Bone Denisty / Osteoporosis Screening--will get at 64yo, not a smoker, not on long term steroids  Mammogram--UTD  PAP--hysterectomy with cervix removal. Was told to stop Pap smears.  Glaucoma screening--due  for appt  Nutrition counseling--completed  Smoking cessation counseling--NA  Advanced Directives--declined further information. Encouraged her to talk with her husband about her wishes, he is who she would want to make decisions for her.   Goals    . Weight < 150 lb (68.04 kg)      Depression: Ongoing since losing 15yo son 7 years ago. His birthday is coming up. Having trouble sleeping at times. No thoughts of self harm. PHQ9 12. Used to work and be out more but since cancer has been more restricted in activities. Husband now at home much of the time as well. Encouraged walking 10 minutes 2-3 times a day. Start celexa, take 1/2 pill for 8 days then 1 full pill. RTC for re-eval in 8 weeks.  Patient Instructions (the written plan) were given to the patient.   [name], MD   11/19/2014

## 2014-11-20 LAB — LIPID PANEL
CHOL/HDL RATIO: 4.3 ratio (ref 0.0–4.4)
Cholesterol, Total: 212 mg/dL — ABNORMAL HIGH (ref 100–199)
HDL: 49 mg/dL (ref >39)
LDL Calculated: 142 mg/dL — ABNORMAL HIGH (ref 0–99)
Triglycerides: 107 mg/dL (ref 0–149)
VLDL Cholesterol Cal: 21 mg/dL (ref 5–40)

## 2014-11-20 LAB — HEPATITIS C ANTIBODY

## 2014-11-22 NOTE — Progress Notes (Signed)
PT aware verbalized understanding °

## 2014-11-23 ENCOUNTER — Encounter: Payer: Self-pay | Admitting: *Deleted

## 2014-11-27 LAB — PROTIME-INR

## 2014-11-28 ENCOUNTER — Telehealth (HOSPITAL_COMMUNITY): Payer: Self-pay | Admitting: *Deleted

## 2014-11-28 NOTE — Telephone Encounter (Signed)
Lab called with an INR of 1.9. Patient is on 5 mg daily and reports that she was 2.3 and 2.1 the las 2 weeks. No changes in meds, no missed doses.

## 2014-11-28 NOTE — Telephone Encounter (Signed)
Take 7.5 mg tonight and then back to 5 mg daily.  Robynn Pane, PA-C 3:39 PM 11/28/2014

## 2014-11-28 NOTE — Telephone Encounter (Signed)
Patient notified to take 7.5 mg tonight then return to 5 mg daily

## 2014-12-14 ENCOUNTER — Encounter: Payer: Self-pay | Admitting: Oncology

## 2014-12-26 LAB — PROTIME-INR: INR: 2.3 — AB (ref 0.9–1.1)

## 2015-01-10 ENCOUNTER — Ambulatory Visit (HOSPITAL_COMMUNITY)
Admission: RE | Admit: 2015-01-10 | Discharge: 2015-01-10 | Disposition: A | Discharge status: Home / Self Care | Payer: Medicare Other | Source: Ambulatory Visit | Attending: Hematology & Oncology | Admitting: Hematology & Oncology

## 2015-01-10 DIAGNOSIS — K469 Unspecified abdominal hernia without obstruction or gangrene: Secondary | ICD-10-CM | POA: Diagnosis not present

## 2015-01-10 DIAGNOSIS — Z8500 Personal history of malignant neoplasm of unspecified digestive organ: Secondary | ICD-10-CM | POA: Insufficient documentation

## 2015-01-10 DIAGNOSIS — C189 Malignant neoplasm of colon, unspecified: Secondary | ICD-10-CM

## 2015-01-10 DIAGNOSIS — I82522 Chronic embolism and thrombosis of left iliac vein: Secondary | ICD-10-CM | POA: Insufficient documentation

## 2015-01-10 DIAGNOSIS — M47816 Spondylosis without myelopathy or radiculopathy, lumbar region: Secondary | ICD-10-CM | POA: Diagnosis not present

## 2015-01-10 DIAGNOSIS — D3502 Benign neoplasm of left adrenal gland: Secondary | ICD-10-CM | POA: Insufficient documentation

## 2015-01-10 DIAGNOSIS — C49A0 Gastrointestinal stromal tumor, unspecified site: Secondary | ICD-10-CM

## 2015-01-10 DIAGNOSIS — Z139 Encounter for screening, unspecified: Secondary | ICD-10-CM

## 2015-01-10 DIAGNOSIS — Z08 Encounter for follow-up examination after completed treatment for malignant neoplasm: Secondary | ICD-10-CM | POA: Insufficient documentation

## 2015-01-10 LAB — POCT I-STAT CREATININE: CREATININE: 0.7 mg/dL (ref 0.44–1.00)

## 2015-01-10 MED ORDER — IOHEXOL 300 MG/ML  SOLN
100.0000 mL | Freq: Once | INTRAMUSCULAR | Status: AC | PRN
  Administered 2015-01-10: 100 mL via INTRAVENOUS

## 2015-01-14 ENCOUNTER — Encounter (HOSPITAL_COMMUNITY): Payer: Medicare Other | Attending: Hematology & Oncology | Admitting: Hematology & Oncology

## 2015-01-14 ENCOUNTER — Encounter (HOSPITAL_BASED_OUTPATIENT_CLINIC_OR_DEPARTMENT_OTHER): Payer: Medicare Other

## 2015-01-14 ENCOUNTER — Encounter (HOSPITAL_COMMUNITY): Payer: Self-pay | Admitting: Hematology & Oncology

## 2015-01-14 VITALS — BP 133/69 | HR 61 | Temp 97.6°F | Resp 18 | Wt 244.0 lb

## 2015-01-14 DIAGNOSIS — C189 Malignant neoplasm of colon, unspecified: Secondary | ICD-10-CM

## 2015-01-14 DIAGNOSIS — C49A0 Gastrointestinal stromal tumor, unspecified site: Secondary | ICD-10-CM

## 2015-01-14 DIAGNOSIS — C499 Malignant neoplasm of connective and soft tissue, unspecified: Secondary | ICD-10-CM | POA: Diagnosis not present

## 2015-01-14 DIAGNOSIS — E611 Iron deficiency: Secondary | ICD-10-CM

## 2015-01-14 DIAGNOSIS — C49A3 Gastrointestinal stromal tumor of small intestine: Secondary | ICD-10-CM | POA: Diagnosis not present

## 2015-01-14 DIAGNOSIS — D6851 Activated protein C resistance: Secondary | ICD-10-CM

## 2015-01-14 DIAGNOSIS — I82422 Acute embolism and thrombosis of left iliac vein: Secondary | ICD-10-CM | POA: Diagnosis not present

## 2015-01-14 DIAGNOSIS — I80299 Phlebitis and thrombophlebitis of other deep vessels of unspecified lower extremity: Secondary | ICD-10-CM | POA: Diagnosis not present

## 2015-01-14 DIAGNOSIS — D509 Iron deficiency anemia, unspecified: Secondary | ICD-10-CM | POA: Diagnosis not present

## 2015-01-14 DIAGNOSIS — I82402 Acute embolism and thrombosis of unspecified deep veins of left lower extremity: Secondary | ICD-10-CM

## 2015-01-14 DIAGNOSIS — Z7901 Long term (current) use of anticoagulants: Secondary | ICD-10-CM

## 2015-01-14 LAB — COMPREHENSIVE METABOLIC PANEL
ALT: 28 U/L (ref 14–54)
AST: 27 U/L (ref 15–41)
Albumin: 3.9 g/dL (ref 3.5–5.0)
Alkaline Phosphatase: 74 U/L (ref 38–126)
Anion gap: 7 (ref 5–15)
BUN: 17 mg/dL (ref 6–20)
CHLORIDE: 109 mmol/L (ref 101–111)
CO2: 25 mmol/L (ref 22–32)
Calcium: 9.6 mg/dL (ref 8.9–10.3)
Creatinine, Ser: 0.77 mg/dL (ref 0.44–1.00)
Glucose, Bld: 94 mg/dL (ref 65–99)
POTASSIUM: 4.5 mmol/L (ref 3.5–5.1)
SODIUM: 141 mmol/L (ref 135–145)
Total Bilirubin: 0.5 mg/dL (ref 0.3–1.2)
Total Protein: 7.6 g/dL (ref 6.5–8.1)

## 2015-01-14 LAB — CBC WITH DIFFERENTIAL/PLATELET
BASOS ABS: 0 10*3/uL (ref 0.0–0.1)
Basophils Relative: 1 %
Eosinophils Absolute: 0.2 10*3/uL (ref 0.0–0.7)
Eosinophils Relative: 3 %
HCT: 43.1 % (ref 36.0–46.0)
Hemoglobin: 14 g/dL (ref 12.0–15.0)
LYMPHS ABS: 2.6 10*3/uL (ref 0.7–4.0)
LYMPHS PCT: 40 %
MCH: 29.2 pg (ref 26.0–34.0)
MCHC: 32.5 g/dL (ref 30.0–36.0)
MCV: 90 fL (ref 78.0–100.0)
MONO ABS: 0.7 10*3/uL (ref 0.1–1.0)
Monocytes Relative: 11 %
Neutro Abs: 3 10*3/uL (ref 1.7–7.7)
Neutrophils Relative %: 46 %
Platelets: 300 10*3/uL (ref 150–400)
RBC: 4.79 MIL/uL (ref 3.87–5.11)
RDW: 13.1 % (ref 11.5–15.5)
WBC: 6.5 10*3/uL (ref 4.0–10.5)

## 2015-01-14 LAB — FERRITIN: FERRITIN: 49 ng/mL (ref 11–307)

## 2015-01-14 NOTE — Progress Notes (Signed)
..  [name]'s reason for visit today are for labs as scheduled per MD orders.  Venipuncture performed with a 23 gauge butterfly needle to R Antecubital.  [name] tolerated venipuncture well and without incident; questions were answered and patient was discharged.

## 2015-01-14 NOTE — Progress Notes (Signed)
Hold Coumadin x 2 days.  Repeat INR in 2 days        Denise Gainer, MD [place] [address]  Stage II GIST Intolerance to gleevec Left lower extremity DVT with significant post-thrombotic syndrome Heterozygote for Factor V Leiden mutation History of iron deficiency  CURRENT THERAPY: Watchful expectation, status post resection of Gist, stage II, intolerant of imatinib. Chronic anticoagulation with Coumadin for Factor V Leiden.  INTERVAL HISTORY: [name] 64 y.o. female returns for followup of iron deficiency with stage II GIST, no adjuvant treatment due to intolerance of imatinib (cardiac dysrhythmia). She also has a history of factor V Leiden mutation with a left lower extremity DVT and significant post-thrombotic syndrome. She has been maintained on Coumadin and has been advised to take it lifelong.   I personally reviewed and went over laboratory results with the patient.  The results are noted within this dictation.  She denies any oncologic or hematologic complaints and ROS questioning is negative.   Her main complaint is with regards to her knee, and also weight gain, "but what do you do about that?"   She says it's hard to exercise since she's still having trouble with her knee. She received a brace and four weeks of therapy in the past, but it didn't help. They also did an injection, which was also unhelpful. After that, they suggested nothing else, so she hasn't done anything else. In terms of her injury, she says that they told her she has a torn meniscus, but it's "not enough to worry about."  She says she started walking and it started acting up again, so she quit that. She says that her knee has "never really been what it should be."  In general, her appetite is good, energy is good, and she says she's sleeping "about like usual." She confirms that her bowels are good and denies any pain when her abdomen is palpated. [name] says she thinks  that she is good on her colonoscopy.  She says she thinks she's ready for Christmas.   She still likes taking the Coumadin and doesn't want to try anything new.  She denies nausea and vomiting, constipation, diarrhea, change in appetite or energy level. She denies any bleeding or unusual bruising.  Past Medical History  Diagnosis Date  . Vitamin B12 deficiency     vit b12 1000 mcg monthly  . Cellulitis of left leg 2006  . Ulcer 05/2009    esophageal  . Clotting disorder ([place])     heterozygosity from factor v leiden  . Pernicious anemia 07/10/2010  . DVT (deep venous thrombosis) ([place]) 07/10/2010    on coumadin  . Factor V Leiden ([place])   . Anxiety   . 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 Allergic urticaria 01/04/2011    Rash from tape.  Marland Kitchen GERD (gastroesophageal reflux disease)   . Ventricular tachycardia ([place]) 03/26/11    has Iron deficiency anemia; Reflux esophagitis; GI BLEEDING; WEIGHT GAIN; CONTUSION, ARM; DVT (deep venous thrombosis) ([place]); Pernicious anemia; Factor V Leiden ([place]); Syncope; Acute blood loss anemia; Chronic ulcer of left leg ([place]); Chronic anticoagulation; Abnormal CXR; Lip swelling; GIST (gastrointestinal stromal tumor), malignant ([place]); Allergic urticaria; SVT (supraventricular tachycardia) ([place]); Hypotension, iatrogenic; Ventricular tachycardia ([place]); Bradycardia; Colonic cancer ([place]); Abnormal CT scan, colon; and Depression on her problem list.     is allergic to imatinib; cephalexin; dexlansoprazole; latex; other; penicillins; tape; enoxaparin sodium; nylon; omeprazole; and sulfonamide  derivatives.  [name] had no medications administered during this visit.  Past Surgical History  Procedure Laterality Date  . Abdominal hysterectomy  1989  . Balloon dilation  12/11/2010    Procedure: BALLOON DILATION;  Surgeon: Rogene Houston, MD;  [facility];  Service: Endoscopy;  Laterality: N/A;  . Laparotomy  01/05/2011    Procedure: EXPLORATORY  LAPAROTOMY;  Surgeon: Jamesetta So;  Location: [facility];  Service: General;  Laterality: N/A;  . Bowel resection  01/05/2011    Procedure: SMALL BOWEL RESECTION;  Surgeon: Jamesetta So;  Location: [facility];  Service: General;;  Partial Small Bowel Resection  . Givens capsule study  01/02/2011    Procedure: GIVENS CAPSULE STUDY;  Surgeon: Rogene Houston, MD;  [facility];  Service: Endoscopy;  Laterality: N/A;  . Colonoscopy  02/25/2012    Procedure: COLONOSCOPY;  Surgeon: Rogene Houston, MD;  [facility];  Service: Endoscopy;  Laterality: N/A;  1200    Denies any headaches, dizziness, double vision, fevers, chills, night sweats, nausea, vomiting, diarrhea, constipation, chest pain, heart palpitations, shortness of breath, blood in stool, black tarry stool, urinary pain, urinary burning, urinary frequency, hematuria.  14 point review of systems was performed and is negative except as detailed under history of present illness and above    PHYSICAL EXAMINATION  ECOG PERFORMANCE STATUS: 0 - Asymptomatic  Filed Vitals:   01/14/15 1031  BP: 133/69  Pulse: 61  Temp: 97.6 F (36.4 C)  Resp: 18    GENERAL:alert, healthy, no distress, well nourished, well developed, comfortable, cooperative, obese and smiling SKIN: skin color, texture, turgor are normal, no rashes or significant lesions HEAD: Normocephalic, No masses, lesions, tenderness or abnormalities EYES: normal, PERRLA, EOMI, Conjunctiva are pink and non-injected EARS: External ears normal OROPHARYNX:lips, buccal mucosa, and tongue normal and mucous membranes are moist  NECK: supple, no adenopathy, thyroid normal size, non-tender, without nodularity, no stridor, non-tender, trachea midline LYMPH:  no palpable lymphadenopathy BREAST:not examined LUNGS: clear to auscultation  HEART: regular rate & rhythm, no murmurs, no gallops, S1 normal and S2 normal ABDOMEN:abdomen soft, non-tender and normal bowel  sounds BACK: Back symmetric, no curvature. EXTREMITIES:less then 2 second capillary refill, no joint deformities, effusion, or inflammation, no edema, no clubbing, no cyanosis significant tiny discoloration of the left lower extremity. Scarring at sites of prior ulcerations. Varicosities in the left posterior calf. NEURO: alert & oriented x 3 with fluent speech, no focal motor/sensory deficits, gait normal   LABORATORY DATA: I have reviewed the data as listed:  CBC    Component Value Date/Time   WBC 6.5 01/14/2015 0930   RBC 4.79 01/14/2015 0930   RBC 3.81* 06/07/2009 1230   HGB 14.0 01/14/2015 0930   HCT 43.1 01/14/2015 0930   PLT 300 01/14/2015 0930   MCV 90.0 01/14/2015 0930   MCH 29.2 01/14/2015 0930   MCHC 32.5 01/14/2015 0930   RDW 13.1 01/14/2015 0930   LYMPHSABS 2.6 01/14/2015 0930   MONOABS 0.7 01/14/2015 0930   EOSABS 0.2 01/14/2015 0930   BASOSABS 0.0 01/14/2015 0930      Chemistry      Component Value Date/Time   NA 141 01/14/2015 0930   K 4.5 01/14/2015 0930   CL 109 01/14/2015 0930   CO2 25 01/14/2015 0930   BUN 17 01/14/2015 0930   CREATININE 0.77 01/14/2015 0930      Component Value Date/Time   CALCIUM 9.6 01/14/2015 0930  ALKPHOS 74 01/14/2015 0930   AST 27 01/14/2015 0930   ALT 28 01/14/2015 0930   BILITOT 0.5 01/14/2015 0930     Lab Results  Component Value Date   INR 2.8* 09/18/2014   INR 2.2* 08/15/2014   INR 2.06* 08/08/2014     RADIOGRAPHIC STUDIES:  CLINICAL DATA: Chronic left lower extremity pain.  EXAM: Left LOWER EXTREMITY VENOUS DOPPLER ULTRASOUND  TECHNIQUE: Gray-scale sonography with graded compression, as well as color Doppler and duplex ultrasound were performed to evaluate the lower extremity deep venous systems from the level of the common femoral vein and including the common femoral, femoral, profunda femoral, popliteal and calf veins including the posterior tibial, peroneal and gastrocnemius veins when  visible. The superficial great saphenous vein was also interrogated. Spectral Doppler was utilized to evaluate flow at rest and with distal augmentation maneuvers in the common femoral, femoral and popliteal veins.  COMPARISON: Ultrasound of July 20, 2014.  FINDINGS: Contralateral Common Femoral Vein: Respiratory phasicity is normal and symmetric with the symptomatic side. No evidence of thrombus. Normal compressibility.  Common Femoral Vein: No evidence of thrombus. Normal compressibility, respiratory phasicity and response to augmentation.  Saphenofemoral Junction: No evidence of thrombus. Normal compressibility and flow on color Doppler imaging.  Profunda Femoral Vein: No evidence of thrombus. Normal compressibility and flow on color Doppler imaging.  Femoral Vein: Continues to be atretic distally, most consistent with sequela of previous deep venous thrombosis.  Popliteal Vein: Stable occlusive thrombus is noted which most likely represents chronic deep venous thrombosis.  Calf Veins: Peroneal veins are not visualized. Posterior tibial veins demonstrate normal compressibility and phasicity without evidence of thrombus.  Superficial Great Saphenous Vein: No evidence of thrombus. Normal compressibility and flow on color Doppler imaging.  Venous Reflux: None.  Other Findings: Superficial varicosities are noted in the left calf and ankle.  IMPRESSION: Left femoral vein remains atretic most likely representing sequela of prior deep venous thrombosis. Stable chronic deep venous thrombosis is noted within the left popliteal vein. No new thrombus is noted. Superficial varicosities are again noted in the left ankle. There is no significant change compared to prior exam   Electronically Signed  By: Marijo Conception, M.D.  On: 08/08/2014 15:59    CLINICAL DATA: Restaging GIST tumor  EXAM: CT ABDOMEN AND PELVIS WITH  CONTRAST  TECHNIQUE: Multidetector CT imaging of the abdomen and pelvis was performed using the standard protocol following bolus administration of intravenous contrast.  CONTRAST: 174mL OMNIPAQUE IOHEXOL 300 MG/ML SOLN  COMPARISON: 01/15/2014  FINDINGS: Lower chest: No pleural fluid. The lung bases appear clear.  Hepatobiliary: There is no focal liver abnormality. The gallbladder appears normal. No gallstones. No biliary dilatation.  Pancreas: Unremarkable appearance of the pancreas.  Spleen: Negative.  Adrenals/Urinary Tract: Normal appearance of the right adrenal gland. Nodule in the left adrenal gland measures 24.3 Hounsfield units and 3.1 cm, image 26/ series 2. Unchanged from previous exam and favored to represent a benign adenoma. Bilateral extra renal pelvis noted. Similar appearance of right renal cyst. The urinary bladder appears normal.  Stomach/Bowel: Small hiatal hernia. The stomach is otherwise normal in appearance. No pathologic dilatation of the small or large bowel loops identified.  Vascular/Lymphatic: Normal appearance of the abdominal aorta. The left iliac vein is chronically occluded. There is enlargement of the right iliac vein and a large varix extends from the right common femoral vein to the left common femoral vein. No enlarged retroperitoneal or mesenteric adenopathy. No enlarged pelvic or inguinal  lymph nodes.  Reproductive: Previous hysterectomy. No adnexal mass identified.  Other: No free fluid or fluid collections identified. Stable appearance of mild haziness within the small bowel mesenteric.  Musculoskeletal: Lumbar spondylosis noted. No aggressive lytic or sclerotic bone lesions noted.  IMPRESSION: 1. Stable exam. No evidence for recurrent disease within the abdomen or pelvis. 2. Stable left adrenal gland adenoma. 3. 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   Electronically Signed  By: Kerby Moors M.D.  On: 01/10/2015 11:52   ASSESSMENT AND PLAN:  Stage II GIST, 1 mitoses/50 hpf, TNM pT3, pN0 Intolerance to gleevec Left lower extremity DVT with significant post-thrombotic syndrome Heterozygote for Factor V Leiden mutation History of iron deficiency Chronic LLE thrombophlebitis Chronic coumadin therapy  CT imaging was reviewed and shows no evidence of recurrence. She has chronic occlusion of the L common iliac. She remains on coumadin and does not wish to change to newer anticoagulation.   I will schedule her for her next mammogram, due after 7/7. Her last colonoscopy was in 2014.  She will stay on her Coumadin.  We will see her back in 6 months  All questions were answered. The patient knows to call the clinic with any problems, questions or concerns. We can certainly see the patient much sooner if necessary.   This document serves as a record of services personally performed by Ancil Linsey, MD. It was created on her behalf by Toni Amend, a trained medical scribe. The creation of this record is based on the scribe's personal observations and the provider's statements to them. This document has been checked and approved by the attending provider.  I have reviewed the above documentation for accuracy and completeness, and I agree with the above. This note was electronically signed.   K[place]by Fam. Whitney Muse, MD

## 2015-01-14 NOTE — Patient Instructions (Signed)
[place] at [facility] Discharge Instructions  RECOMMENDATIONS MADE BY THE CONSULTANT AND ANY TEST RESULTS WILL BE SENT TO YOUR REFERRING PHYSICIAN.   Exam completed by Dr Whitney Muse today Continue coumadin CT scans were good Blood work is good but ferritin is still pending Return to see the doctor in 6 months Please call the clinic if you have any questions or concerns   Thank you for choosing [place] at [facility] to provide your oncology and hematology care.  To afford each patient quality time with our provider, please arrive at least 15 minutes before your scheduled appointment time.    You need to re-schedule your appointment should you arrive 10 or more minutes late.  We strive to give you quality time with our providers, and arriving late affects you and other patients whose appointments are after yours.  Also, if you no show three or more times for appointments you may be dismissed from the clinic at the providers discretion.     Again, thank you for choosing [facility].  Our hope is that these requests will decrease the amount of time that you wait before being seen by our physicians.       _____________________________________________________________  Should you have questions after your visit to [facility], please contact our office at [phone] between the hours of 8:30 a.m. and 4:30 p.m.  Voicemails left after 4:30 p.m. will not be returned until the following business day.  For prescription refill requests, have your pharmacy contact our office.

## 2015-01-17 LAB — POCT INR: INR: 2.3

## 2015-01-22 ENCOUNTER — Encounter (HOSPITAL_COMMUNITY): Payer: Self-pay | Admitting: Emergency Medicine

## 2015-01-22 ENCOUNTER — Inpatient Hospital Stay (HOSPITAL_COMMUNITY)
Admission: EM | Admit: 2015-01-22 | Discharge: 2015-01-25 | DRG: 389 | Disposition: A | Discharge status: Home / Self Care | Payer: Medicare Other | Attending: Family Medicine | Admitting: Family Medicine

## 2015-01-22 DIAGNOSIS — R109 Unspecified abdominal pain: Secondary | ICD-10-CM | POA: Diagnosis not present

## 2015-01-22 DIAGNOSIS — Z9049 Acquired absence of other specified parts of digestive tract: Secondary | ICD-10-CM

## 2015-01-22 DIAGNOSIS — Z85068 Personal history of other malignant neoplasm of small intestine: Secondary | ICD-10-CM

## 2015-01-22 DIAGNOSIS — D510 Vitamin B12 deficiency anemia due to intrinsic factor deficiency: Secondary | ICD-10-CM | POA: Diagnosis present

## 2015-01-22 DIAGNOSIS — Z86718 Personal history of other venous thrombosis and embolism: Secondary | ICD-10-CM

## 2015-01-22 DIAGNOSIS — Z9221 Personal history of antineoplastic chemotherapy: Secondary | ICD-10-CM

## 2015-01-22 DIAGNOSIS — R1033 Periumbilical pain: Secondary | ICD-10-CM

## 2015-01-22 DIAGNOSIS — K565 Intestinal adhesions [bands] with obstruction (postprocedural) (postinfection): Secondary | ICD-10-CM | POA: Diagnosis not present

## 2015-01-22 DIAGNOSIS — I82409 Acute embolism and thrombosis of unspecified deep veins of unspecified lower extremity: Secondary | ICD-10-CM | POA: Diagnosis present

## 2015-01-22 DIAGNOSIS — K219 Gastro-esophageal reflux disease without esophagitis: Secondary | ICD-10-CM | POA: Diagnosis present

## 2015-01-22 DIAGNOSIS — Z7901 Long term (current) use of anticoagulants: Secondary | ICD-10-CM

## 2015-01-22 DIAGNOSIS — Z90710 Acquired absence of both cervix and uterus: Secondary | ICD-10-CM

## 2015-01-22 DIAGNOSIS — K56609 Unspecified intestinal obstruction, unspecified as to partial versus complete obstruction: Secondary | ICD-10-CM | POA: Diagnosis present

## 2015-01-22 DIAGNOSIS — D6851 Activated protein C resistance: Secondary | ICD-10-CM | POA: Diagnosis present

## 2015-01-22 DIAGNOSIS — R8271 Bacteriuria: Secondary | ICD-10-CM | POA: Diagnosis present

## 2015-01-22 NOTE — ED Notes (Signed)
Pt c/o generalized abd pain with nausea since this am. Pt denies any v/d.

## 2015-01-23 ENCOUNTER — Emergency Department (HOSPITAL_COMMUNITY): Payer: Medicare Other

## 2015-01-23 ENCOUNTER — Encounter (HOSPITAL_COMMUNITY): Payer: Self-pay | Admitting: *Deleted

## 2015-01-23 DIAGNOSIS — D6851 Activated protein C resistance: Secondary | ICD-10-CM | POA: Diagnosis not present

## 2015-01-23 DIAGNOSIS — K565 Intestinal adhesions [bands] with obstruction (postprocedural) (postinfection): Secondary | ICD-10-CM | POA: Diagnosis present

## 2015-01-23 DIAGNOSIS — D510 Vitamin B12 deficiency anemia due to intrinsic factor deficiency: Secondary | ICD-10-CM | POA: Diagnosis present

## 2015-01-23 DIAGNOSIS — K5669 Other intestinal obstruction: Secondary | ICD-10-CM | POA: Diagnosis not present

## 2015-01-23 DIAGNOSIS — Z9049 Acquired absence of other specified parts of digestive tract: Secondary | ICD-10-CM | POA: Diagnosis not present

## 2015-01-23 DIAGNOSIS — C762 Malignant neoplasm of abdomen: Secondary | ICD-10-CM | POA: Diagnosis not present

## 2015-01-23 DIAGNOSIS — Z86718 Personal history of other venous thrombosis and embolism: Secondary | ICD-10-CM | POA: Diagnosis not present

## 2015-01-23 DIAGNOSIS — Z7901 Long term (current) use of anticoagulants: Secondary | ICD-10-CM | POA: Diagnosis not present

## 2015-01-23 DIAGNOSIS — K219 Gastro-esophageal reflux disease without esophagitis: Secondary | ICD-10-CM | POA: Diagnosis present

## 2015-01-23 DIAGNOSIS — K56609 Unspecified intestinal obstruction, unspecified as to partial versus complete obstruction: Secondary | ICD-10-CM | POA: Diagnosis present

## 2015-01-23 DIAGNOSIS — R109 Unspecified abdominal pain: Secondary | ICD-10-CM | POA: Diagnosis present

## 2015-01-23 DIAGNOSIS — R8271 Bacteriuria: Secondary | ICD-10-CM | POA: Diagnosis present

## 2015-01-23 DIAGNOSIS — Z9221 Personal history of antineoplastic chemotherapy: Secondary | ICD-10-CM | POA: Diagnosis not present

## 2015-01-23 DIAGNOSIS — Z90710 Acquired absence of both cervix and uterus: Secondary | ICD-10-CM | POA: Diagnosis not present

## 2015-01-23 DIAGNOSIS — Z85068 Personal history of other malignant neoplasm of small intestine: Secondary | ICD-10-CM | POA: Diagnosis not present

## 2015-01-23 LAB — URINALYSIS, ROUTINE W REFLEX MICROSCOPIC
Bilirubin Urine: NEGATIVE
GLUCOSE, UA: NEGATIVE mg/dL
KETONES UR: NEGATIVE mg/dL
LEUKOCYTES UA: NEGATIVE
NITRITE: NEGATIVE
PH: 6 (ref 5.0–8.0)
Protein, ur: NEGATIVE mg/dL

## 2015-01-23 LAB — COMPREHENSIVE METABOLIC PANEL
ALBUMIN: 4.2 g/dL (ref 3.5–5.0)
ALT: 31 U/L (ref 14–54)
AST: 26 U/L (ref 15–41)
Alkaline Phosphatase: 77 U/L (ref 38–126)
Anion gap: 8 (ref 5–15)
BILIRUBIN TOTAL: 0.8 mg/dL (ref 0.3–1.2)
BUN: 14 mg/dL (ref 6–20)
CO2: 26 mmol/L (ref 22–32)
Calcium: 9.7 mg/dL (ref 8.9–10.3)
Chloride: 105 mmol/L (ref 101–111)
Creatinine, Ser: 0.76 mg/dL (ref 0.44–1.00)
GFR calc Af Amer: >60 mL/min (ref >60)
GFR calc non Af Amer: >60 mL/min (ref >60)
GLUCOSE: 129 mg/dL — AB (ref 65–99)
POTASSIUM: 4.1 mmol/L (ref 3.5–5.1)
Sodium: 139 mmol/L (ref 135–145)
Total Protein: 8.3 g/dL — ABNORMAL HIGH (ref 6.5–8.1)

## 2015-01-23 LAB — URINE MICROSCOPIC-ADD ON

## 2015-01-23 LAB — CBC WITH DIFFERENTIAL/PLATELET
Basophils Absolute: 0 10*3/uL (ref 0.0–0.1)
Basophils Relative: 0 %
EOS ABS: 0.1 10*3/uL (ref 0.0–0.7)
EOS PCT: 1 %
HCT: 46.7 % — ABNORMAL HIGH (ref 36.0–46.0)
Hemoglobin: 15.2 g/dL — ABNORMAL HIGH (ref 12.0–15.0)
Lymphocytes Relative: 19 %
Lymphs Abs: 1.9 10*3/uL (ref 0.7–4.0)
MCH: 29.3 pg (ref 26.0–34.0)
MCHC: 32.5 g/dL (ref 30.0–36.0)
MCV: 90 fL (ref 78.0–100.0)
MONOS PCT: 10 %
Monocytes Absolute: 1 10*3/uL (ref 0.1–1.0)
Neutro Abs: 7.1 10*3/uL (ref 1.7–7.7)
Neutrophils Relative %: 70 %
Platelets: 311 10*3/uL (ref 150–400)
RBC: 5.19 MIL/uL — AB (ref 3.87–5.11)
RDW: 13.2 % (ref 11.5–15.5)
WBC: 10.1 10*3/uL (ref 4.0–10.5)

## 2015-01-23 LAB — LIPASE, BLOOD: Lipase: 20 U/L (ref 11–51)

## 2015-01-23 LAB — PROTIME-INR
INR: 1.92 — AB (ref 0.00–1.49)
Prothrombin Time: 21.8 seconds — ABNORMAL HIGH (ref 11.6–15.2)

## 2015-01-23 LAB — HEPARIN LEVEL (UNFRACTIONATED): Heparin Unfractionated: 0.29 IU/mL — ABNORMAL LOW (ref 0.30–0.70)

## 2015-01-23 MED ORDER — ACETAMINOPHEN 325 MG PO TABS: 650.0000 mg | ORAL_TABLET | Freq: Four times a day (QID) | ORAL | Status: DC | PRN

## 2015-01-23 MED ORDER — POLYETHYLENE GLYCOL 3350 17 G PO PACK
17.0000 g | PACK | Freq: Every day | ORAL | Status: DC
  Administered 2015-01-23 – 2015-01-24 (×2): 17 g via ORAL
  Filled 2015-01-23 (×3): qty 1

## 2015-01-23 MED ORDER — ONDANSETRON HCL 4 MG PO TABS: 4.0000 mg | ORAL_TABLET | Freq: Four times a day (QID) | ORAL | Status: DC | PRN

## 2015-01-23 MED ORDER — HEPARIN BOLUS VIA INFUSION
1000.0000 [IU] | Freq: Once | INTRAVENOUS | Status: AC
  Administered 2015-01-23: 1000 [IU] via INTRAVENOUS
  Filled 2015-01-23: qty 1000

## 2015-01-23 MED ORDER — MORPHINE SULFATE (PF) 4 MG/ML IV SOLN
4.0000 mg | Freq: Once | INTRAVENOUS | Status: AC
  Administered 2015-01-23: 4 mg via INTRAVENOUS
  Filled 2015-01-23: qty 1

## 2015-01-23 MED ORDER — METOPROLOL TARTRATE 25 MG PO TABS
12.5000 mg | ORAL_TABLET | Freq: Two times a day (BID) | ORAL | Status: DC
  Administered 2015-01-23 – 2015-01-25 (×5): 12.5 mg via ORAL
  Filled 2015-01-23 (×5): qty 1

## 2015-01-23 MED ORDER — DIATRIZOATE MEGLUMINE & SODIUM 66-10 % PO SOLN
ORAL | Status: AC
  Filled 2015-01-23: qty 30

## 2015-01-23 MED ORDER — ACETAMINOPHEN 650 MG RE SUPP: 650.0000 mg | Freq: Four times a day (QID) | RECTAL | Status: DC | PRN

## 2015-01-23 MED ORDER — MORPHINE SULFATE (PF) 2 MG/ML IV SOLN
2.0000 mg | INTRAVENOUS | Status: DC | PRN
  Administered 2015-01-23: 2 mg via INTRAVENOUS
  Filled 2015-01-23: qty 1

## 2015-01-23 MED ORDER — FENTANYL CITRATE (PF) 100 MCG/2ML IJ SOLN
100.0000 ug | INTRAMUSCULAR | Status: DC | PRN
  Administered 2015-01-23: 100 ug via INTRAVENOUS
  Filled 2015-01-23: qty 2

## 2015-01-23 MED ORDER — IOHEXOL 300 MG/ML  SOLN
100.0000 mL | Freq: Once | INTRAMUSCULAR | Status: AC | PRN
  Administered 2015-01-23: 100 mL via INTRAVENOUS

## 2015-01-23 MED ORDER — ONDANSETRON HCL 4 MG/2ML IJ SOLN
4.0000 mg | Freq: Once | INTRAMUSCULAR | Status: AC
  Administered 2015-01-23: 4 mg via INTRAVENOUS
  Filled 2015-01-23: qty 2

## 2015-01-23 MED ORDER — HEPARIN (PORCINE) IN NACL 100-0.45 UNIT/ML-% IJ SOLN
1350.0000 [IU]/h | INTRAMUSCULAR | Status: DC
  Administered 2015-01-23 – 2015-01-24 (×2): 1300 [IU]/h via INTRAVENOUS
  Administered 2015-01-24: 1350 [IU]/h via INTRAVENOUS
  Filled 2015-01-23 (×3): qty 250

## 2015-01-23 MED ORDER — ONDANSETRON HCL 4 MG/2ML IJ SOLN: 4.0000 mg | Freq: Four times a day (QID) | INTRAMUSCULAR | Status: DC | PRN

## 2015-01-23 MED ORDER — DEXTROSE-NACL 5-0.45 % IV SOLN
INTRAVENOUS | Status: DC
  Administered 2015-01-23 – 2015-01-24 (×3): via INTRAVENOUS

## 2015-01-23 MED ORDER — HYDROCODONE-ACETAMINOPHEN 5-325 MG PO TABS: 1.0000 | ORAL_TABLET | ORAL | Status: DC | PRN

## 2015-01-23 MED ORDER — ZOLPIDEM TARTRATE 5 MG PO TABS: 5.0000 mg | ORAL_TABLET | Freq: Every evening | ORAL | Status: DC | PRN

## 2015-01-23 NOTE — ED Provider Notes (Signed)
CSN: [id number]     Arrival date & time 01/22/15  2218 History   First MD Initiated Contact with Patient 01/22/15 2354     Chief Complaint  Patient presents with  . Abdominal Pain     (Consider location/radiation/quality/duration/timing/severity/associated sxs/prior Treatment) The history is provided by the patient.   [name] is a 64 y.o. female with a past medical and surgical history significant for GERD with h/o esophageal ulcer pernicious anemia and a h/o GIST tumor with small bowel resection presenting with abdominal pain and swelling which started today.  She reports eating a small breakfast after which she developed waxing and waning abdominal pain, described as severe cramps, partially relieved by bms. (has had 4 small non diarrheal, hard or bloody bm's today) which is typical for her especially after a day without a bm, such as yesterday.  She ate a small lunch, then had escalation of pain.  She reports nausea without vomiting, dysuria, back pain, fevers or chills.  She took her normal prevacid today, she has found no relief of symptoms.   Past Medical History  Diagnosis Date  . Vitamin B12 deficiency     vit b12 1000 mcg monthly  . Cellulitis of left leg 2006  . Ulcer 05/2009    esophageal  . Clotting disorder ([place])     heterozygosity from factor v leiden  . Pernicious anemia 07/10/2010  . DVT (deep venous thrombosis) ([place]) 07/10/2010    on coumadin  . Factor V Leiden ([place])   . Anxiety   . 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 Allergic urticaria 01/04/2011    Rash from tape.  Marland Kitchen GERD (gastroesophageal reflux disease)   . Ventricular tachycardia ([place]) 03/26/11   Past Surgical History  Procedure Laterality Date  . Abdominal hysterectomy  1989  . Balloon dilation  12/11/2010    Procedure: BALLOON DILATION;  Surgeon: Rogene Houston, MD;  [facility];  Service: Endoscopy;  Laterality: N/A;  . Laparotomy  01/05/2011    Procedure: EXPLORATORY  LAPAROTOMY;  Surgeon: Jamesetta So;  Location: [facility];  Service: General;  Laterality: N/A;  . Bowel resection  01/05/2011    Procedure: SMALL BOWEL RESECTION;  Surgeon: Jamesetta So;  Location: [facility];  Service: General;;  Partial Small Bowel Resection  . Givens capsule study  01/02/2011    Procedure: GIVENS CAPSULE STUDY;  Surgeon: Rogene Houston, MD;  [facility];  Service: Endoscopy;  Laterality: N/A;  . Colonoscopy  02/25/2012    Procedure: COLONOSCOPY;  Surgeon: Rogene Houston, MD;  [facility];  Service: Endoscopy;  Laterality: N/A;  1200   History reviewed. No pertinent family history. Social History  Substance Use Topics  . Smoking status: Never Smoker   . Smokeless tobacco: Never Used  . Alcohol Use: No   OB History    No data available     Review of Systems  Constitutional: Negative for fever and chills.  HENT: Negative for congestion and sore throat.   Eyes: Negative.   Respiratory: Negative for chest tightness and shortness of breath.   Cardiovascular: Negative for chest pain.  Gastrointestinal: Positive for nausea, abdominal pain and abdominal distention. Negative for vomiting and diarrhea.  Genitourinary: Negative.   Musculoskeletal: Negative for joint swelling, arthralgias and neck pain.  Skin: Negative.  Negative for rash and wound.  Neurological: Negative for dizziness, weakness, light-headedness, numbness and headaches.  Psychiatric/Behavioral: Negative.  Allergies  Imatinib; Cephalexin; Dexlansoprazole; Latex; Other; Penicillins; Tape; Enoxaparin sodium; Nylon; Omeprazole; and Sulfonamide derivatives  Home Medications   Prior to Admission medications   Medication Sig Start Date End Date Taking? Authorizing Provider  acetaminophen (TYLENOL) 325 MG tablet Take 650 mg by mouth every 6 (six) hours as needed. Pain    Historical Provider, MD  citalopram (CELEXA) 20 MG tablet Take 1 tablet (20 mg total) by mouth daily. Patient  not taking: Reported on 01/14/2015 11/19/14   [name], MD  cyanocobalamin (,VITAMIN B-12,) 1000 MCG/ML injection Inject 1 mL (1,000 mcg total) into the muscle every 30 (thirty) days. 02/01/13   Baird Cancer, PA-C  lansoprazole (PREVACID) 15 MG capsule Take 15 mg by mouth daily.     Historical Provider, MD  metoprolol tartrate (LOPRESSOR) 25 MG tablet Take 1 tablet (25 mg total) by mouth daily. Take 1/2 Tablet Two times Daily Patient taking differently: Take 12.5 mg by mouth 2 (two) times daily.  05/16/14   Evans Lance, MD  traMADol (ULTRAM) 50 MG tablet Take 1 tablet (50 mg total) by mouth every 6 (six) hours as needed. Patient not taking: Reported on 01/14/2015 08/08/14   Carmin Muskrat, MD  warfarin (COUMADIN) 2 MG tablet Take 1 tablet (2 mg total) by mouth daily at 6 PM. Currently takes 7mg /7mg /6mg  05/29/14   Baird Cancer, PA-C  warfarin (COUMADIN) 5 MG tablet Take 1 tablet (5 mg total) by mouth daily. 09/04/14   Manon Hilding Kefalas, PA-C   BP 148/76 mmHg  Pulse 62  Temp(Src) 97.8 F (36.6 C) (Oral)  Resp 20  Ht 5\' 7"  (1.702 m)  Wt 111.131 kg  BMI 38.36 kg/m2  SpO2 93% Physical Exam  Constitutional: She appears well-developed and well-nourished.  HENT:  Head: Normocephalic and atraumatic.  Eyes: Conjunctivae are normal.  Neck: Normal range of motion.  Cardiovascular: Normal rate, regular rhythm, normal heart sounds and intact distal pulses.   Pulmonary/Chest: Effort normal and breath sounds normal. She has no wheezes.  Abdominal: Soft. Bowel sounds are normal. There is no hepatosplenomegaly. There is tenderness in the periumbilical area. There is guarding. There is no rigidity and no CVA tenderness.  Musculoskeletal: Normal range of motion.  Neurological: She is alert.  Skin: Skin is warm and dry.  Psychiatric: She has a normal mood and affect.  Nursing note and vitals reviewed.   ED Course  Procedures (including critical care time) Labs Review Labs Reviewed  CBC  WITH DIFFERENTIAL/PLATELET - Abnormal; Notable for the following:    RBC 5.19 (*)    Hemoglobin 15.2 (*)    HCT 46.7 (*)    All other components within normal limits  COMPREHENSIVE METABOLIC PANEL - Abnormal; Notable for the following:    Glucose, Bld 129 (*)    Total Protein 8.3 (*)    All other components within normal limits  URINALYSIS, ROUTINE W REFLEX MICROSCOPIC (NOT AT [facility]) - Abnormal; Notable for the following:    Specific Gravity, Urine >1.030 (*)    Hgb urine dipstick MODERATE (*)    All other components within normal limits  PROTIME-INR - Abnormal; Notable for the following:    Prothrombin Time 21.8 (*)    INR 1.92 (*)    All other components within normal limits  URINE MICROSCOPIC-ADD ON - Abnormal; Notable for the following:    Squamous Epithelial / LPF 0-5 (*)    Bacteria, UA MANY (*)    Crystals CA OXALATE CRYSTALS (*)  All other components within normal limits  LIPASE, BLOOD    Imaging Review Dg Abd Acute W/chest  01/23/2015  CLINICAL DATA:  64 year old female with abdominal pain and cramping. EXAM: DG ABDOMEN ACUTE W/ 1V CHEST COMPARISON:  CT dated 01/10/2015 FINDINGS: There is moderate stool throughout the colon. A loop of small bowel noted in the left hemi abdomen measuring up to 3 cm 3.2 cm in caliber. A 1.4 cm round density along the medial aspect of the ascending colon likely represents a fecalith. No definite free air identified. No radiopaque densities noted over the renal silhouettes. There is degenerative changes of the spine. No acute fracture. IMPRESSION: Constipation. Minimally dilated loop of small bowel in the left lower abdomen may represent a segmental ileus. There is no definite evidence of bowel obstruction at this time. Clinical correlation recommended. Probable fecalith in the ascending colon. Electronically Signed   By: Anner Crete M.D.   On: 01/23/2015 01:16   I have personally reviewed and evaluated these images and lab results as part of  my medical decision-making.   EKG Interpretation None      MDM   Final diagnoses:  Periumbilical abdominal pain    Patients  labs reviewed.  Radiological studies were viewed, interpreted and considered during the medical decision making and disposition process. I agree with radiologists reading.  Results were also discussed with patient. Pt with risk factors for sbo, discussed with Dr. Eulis Foster who endorses Ct scan to better define ileus vs obstruction. He will dispo pt once imaging results.   Urine cx ordered, bacteria on UA, no urinary sx, would defer to culture to determine need for abx.  Pt tx with morphine and zofran, sx improved.       Evalee Jefferson, PA-C 01/23/15 0130  Daleen Bo, MD 01/23/15 [phone]

## 2015-01-23 NOTE — Progress Notes (Signed)
ANTICOAGULATION CONSULT NOTE - Initial Consult  Pharmacy Consult for Heparin Indication: h/o DVT's, factor V leiden, on chronic Coumaidn  Allergies  Allergen Reactions  . Imatinib Other (See Comments)    Cardiac dysrhythmia  . Cephalexin Swelling  . Dexlansoprazole Swelling  . Latex Itching  . Other Swelling    Patient states that Pecans cause her mouth to swell.  Marland Kitchen Penicillins Swelling  . Tape Itching  . Enoxaparin Sodium Itching, Swelling and Palpitations  . Nylon Rash  . Omeprazole Itching and Rash  . Sulfonamide Derivatives Rash   Patient Measurements: Height: 5\' 7"  (170.2 cm) Weight: 247 lb 9.2 oz (112.3 kg) IBW/kg (Calculated) : 61.6 HEPARIN DW (KG): 87.6  Vital Signs: Temp: 97.5 F (36.4 C) (12/28 0710) Temp Source: Oral (12/28 0710) BP: 139/73 mmHg (12/28 0710) Pulse Rate: 65 (12/28 0710)  Labs:  Recent Labs  01/23/15 0025  HGB 15.2*  HCT 46.7*  PLT 311  LABPROT 21.8*  INR 1.92*  CREATININE 0.76    Estimated Creatinine Clearance: 93.1 mL/min (by C-G formula based on Cr of 0.76).   Medical History: Past Medical History  Diagnosis Date  . Vitamin B12 deficiency     vit b12 1000 mcg monthly  . Cellulitis of left leg 2006  . Ulcer 05/2009    esophageal  . Clotting disorder ([place])     heterozygosity from factor v leiden  . Pernicious anemia 07/10/2010  . DVT (deep venous thrombosis) ([place]) 07/10/2010    on coumadin  . Factor V Leiden ([place])   . Anxiety   . 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 Allergic urticaria 01/04/2011    Rash from tape.  Marland Kitchen GERD (gastroesophageal reflux disease)   . Ventricular tachycardia ([place]) 03/26/11    Medications:  Prescriptions prior to admission  Medication Sig Dispense Refill Last Dose  . acetaminophen (TYLENOL) 325 MG tablet Take 650 mg by mouth every 6 (six) hours as needed. Pain   Taking  . citalopram (CELEXA) 20 MG tablet Take 1 tablet (20 mg total) by mouth daily. (Patient not taking: Reported on  01/14/2015) 30 tablet 3 Not Taking  . cyanocobalamin (,VITAMIN B-12,) 1000 MCG/ML injection Inject 1 mL (1,000 mcg total) into the muscle every 30 (thirty) days. 10 mL 6 Taking  . lansoprazole (PREVACID) 15 MG capsule Take 15 mg by mouth daily.    Taking  . metoprolol tartrate (LOPRESSOR) 25 MG tablet Take 1 tablet (25 mg total) by mouth daily. Take 1/2 Tablet Two times Daily (Patient taking differently: Take 12.5 mg by mouth 2 (two) times daily. ) 90 tablet 3 Taking  . traMADol (ULTRAM) 50 MG tablet Take 1 tablet (50 mg total) by mouth every 6 (six) hours as needed. (Patient not taking: Reported on 01/14/2015) 15 tablet 0 Not Taking  . warfarin (COUMADIN) 2 MG tablet Take 1 tablet (2 mg total) by mouth daily at 6 PM. Currently takes 7mg /7mg /6mg  90 tablet 2 Taking  . warfarin (COUMADIN) 5 MG tablet Take 1 tablet (5 mg total) by mouth daily. 30 tablet 6 Taking    Assessment: 64yo female past medical history significant for small bowel resection in 2012 for small bowel cancer status post chemotherapy as well, esophageal ulcer presenting today with 2 days history of abdominal pain in her mid abdomen . No history of nausea or vomiting the patient had 4 bowel movements since yesterday and the bowel movements are not diarrheic. Patient denies abdominal trauma, no cough shortness of breath  or chest pain. The patient is on Coumadin for history of factor V Leyden and history of DVTs.  Asked to initiate Heparin while Coumadin on hold. INR is slightly below therapeutic range on admission.  Goal of Therapy:  Heparin level 0.3-0.7 units/ml Monitor platelets by anticoagulation protocol: Yes   Plan:  Heparin 1000 units bolus now x 1 Heparin infusion at 1300 units/hr Heparin level in 6-8 hrs then daily CBC daily while on Heparin  [place] Crane, Tyshana Nishida A 01/23/2015,7:41 AM

## 2015-01-23 NOTE — Progress Notes (Signed)
  PROGRESS NOTE  [name] [id number] DOB: 07/08/1950 DOA: 01/22/2015 PCP: [name], MD  Summary: 32 yof PMHx of small bowel resection in 2012 for small bowel cancer status post chemotherapy as well, factor V Leiden, DVTs, and esophageal ulcers presented with abdominal pain. Abdominal CT revealed a small bowel obstruction. She was admitted for further management.   Assessment/Plan: 1. SBO with possible adhesions. Dr. Arnoldo Morale aware of patient, no NG tube needed at this time. Continue NPO and pain management.  2. PMH Stage II GIST, PMH small bowel resection 3. Heterozygote for factor V Leiden with history of DVTs in the left leg. On chronic Coumadin therapy as outpt. On heparin as inpt. 4. PMH esophageal ulcer, GERD 5. Pernicious anemia   Overall improved. Continue conservative management with antiemetic and pain management.  Continue NPO.   Code Status: Full DVT prophylaxis: Heparin  Family Communication: Sister and brother-in-law at bedside. Discussed care plan with the patient.  Disposition Plan: Anticipate discharge in 1-2 days.   Murray Hodgkins, MD  [facility]  Pager [phone] If 7PM-7AM, please contact night-coverage at [email or web address], password [facility] 01/23/2015, 6:41 AM  LOS: 0 days   Consultants:  General surgery  Procedures:    Antibiotics:    HPI/Subjective: Feels okay. Still has intermittent pain and nausea. Denies vomiting.    Objective: Filed Vitals:   01/23/15 0400 01/23/15 0430 01/23/15 0500 01/23/15 0530  BP: 158/83 131/81 131/74 132/77  Pulse: 61 75 66 64  Temp:      TempSrc:      Resp: 14 14 16 12   Height:      Weight:      SpO2: 96% 92% 90% 91%   No intake or output data in the 24 hours ending 01/23/15 0641   Filed Weights   01/22/15 2312  Weight: 111.131 kg (245 lb)    Exam:    VSS, afebrile General:  Appears comfortable, calm. Cardiovascular: Regular rate and rhythm, no murmur, rub or gallop. No lower extremity  edema. Respiratory: Clear to auscultation bilaterally, no wheezes, rales or rhonchi. Normal respiratory effort. Abdomen: soft, decreased bowel sounds Psychiatric: grossly normal mood and affect, speech fluent and appropriate  New data reviewed:  Abdominal CT -  Small bowel obstruction with a transition zone just distal to the ileo ileal anastomosis in the anterior pelvis and secondary to peritoneal adhesions.  CMP unremarkable  CBC unremarkable  INR 1.92  Pertinent data since admission:  Abdominal CT -  Small bowel obstruction with a transition zone just distal to the ileo ileal anastomosis in the anterior pelvis and secondary to peritoneal adhesions.  CMP unremarkable  CBC unremarkable  INR 1.92   Pending data:  UC  Scheduled Meds: . diatrizoate meglumine-sodium       Continuous Infusions:   Active Problems:   Small bowel obstruction ([name])   SBO (small bowel obstruction) ([place])   Time spent 20 minutes   By signing my name below, I, Rosalie Doctor attest that this documentation has been prepared under the direction and in the presence of Murray Hodgkins, MD Electronically signed: Rosalie Doctor, Scribe.  01/23/2015 10:40am  I personally performed the services described in this documentation. All medical record entries made by the scribe were at my direction. I have reviewed the chart and agree that the record reflects my personal performance and is accurate and complete. Murray Hodgkins, MD

## 2015-01-23 NOTE — H&P (Signed)
[facility]                                                                                    Patient Demographics  [name], is a 65 y.o. female  CSN: [id number]  MRN: [id number]  DOB - [birth year]  Admit Date - 01/22/2015  Outpatient Primary MD for the patient is [name], MD   With History of -  Past Medical History  Diagnosis Date  . Vitamin B12 deficiency     vit b12 1000 mcg monthly  . Cellulitis of left leg 2006  . Ulcer 05/2009    esophageal  . Clotting disorder ([place])     heterozygosity from factor v leiden  . Pernicious anemia 07/10/2010  . DVT (deep venous thrombosis) ([place]) 07/10/2010    on coumadin  . Factor V Leiden ([place])   . Anxiety   . 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 Allergic urticaria 01/04/2011    Rash from tape.  Marland Kitchen GERD (gastroesophageal reflux disease)   . Ventricular tachycardia ([place]) 03/26/11      Past Surgical History  Procedure Laterality Date  . Abdominal hysterectomy  1989  . Balloon dilation  12/11/2010    Procedure: BALLOON DILATION;  Surgeon: Rogene Houston, MD;  Location[facility];  Service: Endoscopy;  Laterality: N/A;  . Laparotomy  01/05/2011    Procedure: EXPLORATORY LAPAROTOMY;  Surgeon: Jamesetta So;  Location: [facility] ORS;  Service: General;  Laterality: N/A;  . Bowel resection  01/05/2011    Procedure: SMALL BOWEL RESECTION;  Surgeon: Jamesetta So;  Location: [facility] ORS;  Service: General;;  Partial Small Bowel Resection  . Givens capsule study  01/02/2011    Procedure: GIVENS CAPSULE STUDY;  Surgeon: Rogene Houston, MD;  Location: [facility];  Service: Endoscopy;  Laterality: N/A;  . Colonoscopy  02/25/2012    Procedure: COLONOSCOPY;  Surgeon: Rogene Houston, MD;  Location: [facility];  Service: Endoscopy;  Laterality: N/A;  1200    in for   Chief Complaint  Patient presents with  . Abdominal Pain     HPI  [name]  is a 64 y.o. female, past medical history significant for  small bowel resection in 2012 for small bowel cancer status post chemotherapy as well, esophageal ulcer presenting today with 2 days history of abdominal pain in her mid abdomen . No history of nausea or vomiting the patient had 4 bowel movements since yesterday and the bowel movements are not diarrheic. Patient denies abdominal trauma, no cough shortness of breath or chest pain. The patient is on Coumadin for history of factor V Leyden and history of DVTs.    Review of Systems    In addition to the HPI above,  No Headache, No changes with Vision or hearing, No problems swallowing food or Liquids, No Chest pain, Cough or Shortness of Breath,  No Nausea or Vommitting, Bowel movements are regular, No Blood in stool or Urine, No dysuria, No new skin rashes or bruises, No new joints pains-aches,  No new weakness, tingling, numbness in any extremity, No recent weight gain or loss,  No polyuria, polydypsia or polyphagia, No significant Mental Stressors.  A full 10 point Review of Systems was done, except as stated above, all other Review of Systems were negative.   Social History Social History  Substance Use Topics  . Smoking status: Never Smoker   . Smokeless tobacco: Never Used  . Alcohol Use: No     Family History History reviewed. No pertinent family history.   Prior to Admission medications   Medication Sig Start Date End Date Taking? Authorizing Provider  acetaminophen (TYLENOL) 325 MG tablet Take 650 mg by mouth every 6 (six) hours as needed. Pain    Historical Provider, MD  citalopram (CELEXA) 20 MG tablet Take 1 tablet (20 mg total) by mouth daily. Patient not taking: Reported on 01/14/2015 11/19/14   [name], MD  cyanocobalamin (,VITAMIN B-12,) 1000 MCG/ML injection Inject 1 mL (1,000 mcg total) into the muscle every 30 (thirty) days. 02/01/13   Baird Cancer, PA-C  lansoprazole (PREVACID) 15 MG capsule Take 15 mg by mouth daily.     Historical Provider, MD   metoprolol tartrate (LOPRESSOR) 25 MG tablet Take 1 tablet (25 mg total) by mouth daily. Take 1/2 Tablet Two times Daily Patient taking differently: Take 12.5 mg by mouth 2 (two) times daily.  05/16/14   Evans Lance, MD  traMADol (ULTRAM) 50 MG tablet Take 1 tablet (50 mg total) by mouth every 6 (six) hours as needed. Patient not taking: Reported on 01/14/2015 08/08/14   Carmin Muskrat, MD  warfarin (COUMADIN) 2 MG tablet Take 1 tablet (2 mg total) by mouth daily at 6 PM. Currently takes 7mg /7mg /6mg  05/29/14   Baird Cancer, PA-C  warfarin (COUMADIN) 5 MG tablet Take 1 tablet (5 mg total) by mouth daily. 09/04/14   Baird Cancer, PA-C    Allergies  Allergen Reactions  . Imatinib Other (See Comments)    Cardiac dysrhythmia  . Cephalexin Swelling  . Dexlansoprazole Swelling  . Latex Itching  . Other Swelling    Patient states that Pecans cause her mouth to swell.  Marland Kitchen Penicillins Swelling  . Tape Itching  . Enoxaparin Sodium Itching, Swelling and Palpitations  . Nylon Rash  . Omeprazole Itching and Rash  . Sulfonamide Derivatives Rash    Physical Exam  Vitals  Blood pressure 131/74, pulse 66, temperature 97.8 F (36.6 C), temperature source Oral, resp. rate 16, height 5\' 7"  (1.702 m), weight 111.131 kg (245 lb), SpO2 90 %.   1. General well-developed, well-nourished female, extremely pleasant in no acute distress  2. Normal affect and insight, Not Suicidal or Homicidal, Awake Alert, Oriented X 3.  3. No F.N deficits, grossly, patient moving all extremities,   4. Ears and Eyes appear Normal, Conjunctivae clear, PERRLA. Moist Oral Mucosa.  5. Supple Neck, No JVD, No cervical lymphadenopathy appriciated, No Carotid Bruits.  6. Symmetrical Chest wall movement, Good air movement bilaterally, CTAB.  7. RRR, No Gallops, Rubs or Murmurs, No Parasternal Heave.  8. Positive Bowel Sounds, Abdomen Soft, mildly tender, No organomegaly appriciated,No rebound -guarding or  rigidity.  9.  No Cyanosis, Normal Skin Turgor, No Skin Rash or Bruise.  10. Good muscle tone,  joints appear normal , no effusions, Normal ROM.    Data Review  CBC  Recent Labs Lab 01/23/15 0025  WBC 10.1  HGB 15.2*  HCT 46.7*  PLT 311  MCV 90.0  MCH 29.3  MCHC 32.5  RDW 13.2  LYMPHSABS 1.9  MONOABS 1.0  EOSABS  0.1  BASOSABS 0.0   ------------------------------------------------------------------------------------------------------------------  Chemistries   Recent Labs Lab 01/23/15 0025  NA 139  K 4.1  CL 105  CO2 26  GLUCOSE 129*  BUN 14  CREATININE 0.76  CALCIUM 9.7  AST 26  ALT 31  ALKPHOS 77  BILITOT 0.8   ------------------------------------------------------------------------------------------------------------------ estimated creatinine clearance is 92.5 mL/min (by C-G formula based on Cr of 0.76). ------------------------------------------------------------------------------------------------------------------ No results for input(s): TSH, T4TOTAL, T3FREE, THYROIDAB in the last 72 hours.  Invalid input(s): FREET3   Coagulation profile  Recent Labs Lab 01/23/15 0025  INR 1.92*   ------------------------------------------------------------------------------------------------------------------- No results for input(s): DDIMER in the last 72 hours. -------------------------------------------------------------------------------------------------------------------  Cardiac Enzymes No results for input(s): CKMB, TROPONINI, MYOGLOBIN in the last 168 hours.  Invalid input(s): CK ------------------------------------------------------------------------------------------------------------------ Invalid input(s): POCBNP   ---------------------------------------------------------------------------------------------------------------  Urinalysis    Component Value Date/Time   COLORURINE YELLOW 01/23/2015 0025   APPEARANCEUR CLEAR 01/23/2015  0025   LABSPEC >1.030* 01/23/2015 0025   PHURINE 6.0 01/23/2015 0025   GLUCOSEU NEGATIVE 01/23/2015 0025   HGBUR MODERATE* 01/23/2015 0025   BILIRUBINUR NEGATIVE 01/23/2015 0025   KETONESUR NEGATIVE 01/23/2015 0025   PROTEINUR NEGATIVE 01/23/2015 0025   UROBILINOGEN 0.2 04/03/2011 0259   NITRITE NEGATIVE 01/23/2015 0025   LEUKOCYTESUR NEGATIVE 01/23/2015 0025    ----------------------------------------------------------------------------------------------------------------   Imaging results:   Ct Abdomen Pelvis W Contrast  01/23/2015  CLINICAL DATA:  64 year old female with abdominal pain and nausea. History of GIST and prior small bowel resection. EXAM: CT ABDOMEN AND PELVIS WITH CONTRAST TECHNIQUE: Multidetector CT imaging of the abdomen and pelvis was performed using the standard protocol following bolus administration of intravenous contrast. CONTRAST:  172mL OMNIPAQUE IOHEXOL 300 MG/ML  SOLN COMPARISON:  Abdominal radiograph dated 01/23/2015 and CT dated 01/10/2015 FINDINGS: The visualized lung bases are clear. No intra-abdominal free air. Trace free fluid within the pelvis. The liver, gallbladder, pancreas, spleen, and the right adrenal gland appear unremarkable. There is a stable appearing 3 cm left adrenal hypodense nodule, indeterminate, likely an adenoma. MRI may provide better evaluation. There bilateral extrarenal pelvis. There is mild fullness of the right renal pelvis. There is no hydronephrosis on either side. Small stable right renal hypodense lesion most compatible with a cyst. The visualized ureters appear unremarkable. The urinary bladder is only partially distended. There is apparent diffuse thickening of the bladder wall which may be partly related to underdistention. Cystitis is not excluded. Correlation with urinalysis recommended. Hysterectomy. There is a small hiatal hernia. This postsurgical changes of partial small bowel resection with an ileo ileal anastomosis in the  anterior pelvis. The anastomosis appears patent. There is a focal area of abrupt caliber change within the small bowel 1 cm distal to the anastomosis (series 2 image 69) compatible with small bowel obstruction. There is abutment of this loop of bowel to the anterior peritoneal wall compatible with adhesions. There is mild dilatation and fecalization of the small bowel segment within the anterior pelvis. The distal small bowel and terminal ileum are collapsed. There is colonic diverticulosis without active inflammation. A fecalith is noted in the proximal transverse colon within a diverticulum. The appendix is unremarkable. The abdominal aorta and IVC appear patent. The left common iliac vein is not visualized and may be atrophic or aplastic. The no portal venous gas identified. There is mild haziness of the mesentery with multiple top-normal lymph nodes with a "misty mesentery" appearance. This finding is nonspecific but may be related to underlying inflammatory/infectious etiology. There is no adenopathy.  Midline vertical anterior pelvic wall incisional scar. A small fat containing left paraumbilical hernia noted. There is stable prominence of the vasculature in the subcutaneous soft tissues of the anterior pelvic wall. There is degenerative changes of the spine. No acute fracture. IMPRESSION: Small bowel obstruction with a transition zone just distal to the ileo ileal anastomosis in the anterior pelvis and secondary to peritoneal adhesions. Electronically Signed   By: Anner Crete M.D.   On: 01/23/2015 02:17   Ct Abdomen Pelvis W Contrast  01/10/2015  CLINICAL DATA:  Restaging GIST tumor EXAM: CT ABDOMEN AND PELVIS WITH CONTRAST TECHNIQUE: Multidetector CT imaging of the abdomen and pelvis was performed using the standard protocol following bolus administration of intravenous contrast. CONTRAST:  14mL OMNIPAQUE IOHEXOL 300 MG/ML  SOLN COMPARISON:  01/15/2014 FINDINGS: Lower chest:  No pleural fluid.  The  lung bases appear clear. Hepatobiliary: There is no focal liver abnormality. The gallbladder appears normal. No gallstones. No biliary dilatation. Pancreas: Unremarkable appearance of the pancreas. Spleen: Negative. Adrenals/Urinary Tract: Normal appearance of the right adrenal gland. Nodule in the left adrenal gland measures 24.3 Hounsfield units and 3.1 cm, image 26/ series 2. Unchanged from previous exam and favored to represent a benign adenoma. Bilateral extra renal pelvis noted. Similar appearance of right renal cyst. The urinary bladder appears normal. Stomach/Bowel: Small hiatal hernia. The stomach is otherwise normal in appearance. No pathologic dilatation of the small or large bowel loops identified. Vascular/Lymphatic: Normal appearance of the abdominal aorta. The left iliac vein is chronically occluded. There is enlargement of the right iliac vein and a large varix extends from the right common femoral vein to the left common femoral vein. No enlarged retroperitoneal or mesenteric adenopathy. No enlarged pelvic or inguinal lymph nodes. Reproductive: Previous hysterectomy.  No adnexal mass identified. Other: No free fluid or fluid collections identified. Stable appearance of mild haziness within the small bowel mesenteric. Musculoskeletal: Lumbar spondylosis noted. No aggressive lytic or sclerotic bone lesions noted. IMPRESSION: 1. Stable exam. No evidence for recurrent disease within the abdomen or pelvis. 2. Stable left adrenal gland adenoma. 3. 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 Electronically Signed   By: Kerby Moors M.D.   On: 01/10/2015 11:52   Dg Abd Acute W/chest  01/23/2015  CLINICAL DATA:  64 year old female with abdominal pain and cramping. EXAM: DG ABDOMEN ACUTE W/ 1V CHEST COMPARISON:  CT dated 01/10/2015 FINDINGS: There is moderate stool throughout the colon. A loop of small bowel noted in the left hemi abdomen measuring up to 3 cm 3.2 cm in caliber. A 1.4 cm round  density along the medial aspect of the ascending colon likely represents a fecalith. No definite free air identified. No radiopaque densities noted over the renal silhouettes. There is degenerative changes of the spine. No acute fracture. IMPRESSION: Constipation. Minimally dilated loop of small bowel in the left lower abdomen may represent a segmental ileus. There is no definite evidence of bowel obstruction at this time. Clinical correlation recommended. Probable fecalith in the ascending colon. Electronically Signed   By: Anner Crete M.D.   On: 01/23/2015 01:16      Assessment & Plan  1. Small bowel obstruction possible adhesions/ 2. History of small bowel cancer status post surgery and chemotherapy 3. History of factor V Leiden with history of DVTs in the left leg on chronic Coumadin therapy  Plan Keep nothing by mouth except for medications Switch Coumadin to heparin Pain control No NG tube needed at this time  Dr. Arnoldo Morale aware of patient   DVT Prophylaxis Heparin   AM Labs Ordered, also please review Full Orders  Family Communication: Admission, patients condition and plan of care including tests being ordered have been discussed with the patient and husband who indicate understanding and agree with the plan and Code Status.  Code Status full  Disposition Plan: Home  Time spent in minutes : 39 minutes  Condition GUARDED   @SIGNATURE @

## 2015-01-23 NOTE — Consult Note (Signed)
Reason for Consult: Small bowel obstruction Referring Physician: Hospitalist  [name] is an 64 y.o. female.  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 She had a similar episode in 2013 which resolved without surgery. She states her last bowel movement was yesterday evening and she did pass gas this morning. Her abdominal pain has significantly eased and she does not have nausea. CT scan showed a patent anastomosis, though it appeared to have a transition zone just distal to this secondary to an adhesion to the abdominal wall. The patient is on Coumadin for history of DVT and factor V Leyden factor. She is currently on a heparin drip.  Past Medical History  Diagnosis Date  . Vitamin B12 deficiency     vit b12 1000 mcg monthly  . Cellulitis of left leg 2006  . Ulcer 05/2009    esophageal  . Clotting disorder ([place])     heterozygosity from factor v leiden  . Pernicious anemia 07/10/2010  . DVT (deep venous thrombosis) ([place]) 07/10/2010    on coumadin  . Factor V Leiden ([place])   . Anxiety   . 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 Allergic urticaria 01/04/2011    Rash from tape.  Marland Kitchen GERD (gastroesophageal reflux disease)   . Ventricular tachycardia ([place]) 03/26/11    Past Surgical History  Procedure Laterality Date  . Abdominal hysterectomy  1989  . Balloon dilation  12/11/2010    Procedure: BALLOON DILATION;  Surgeon: Rogene Houston, MD;  [facility];  Service: Endoscopy;  Laterality: N/A;  . Laparotomy  01/05/2011    Procedure: EXPLORATORY LAPAROTOMY;  Surgeon: Jamesetta So;  Location: [facility];  Service: General;  Laterality: N/A;  . Bowel resection  01/05/2011    Procedure: SMALL BOWEL RESECTION;  Surgeon: Jamesetta So;  Location: [facility];  Service: General;;  Partial Small Bowel Resection  . Givens  capsule study  01/02/2011    Procedure: GIVENS CAPSULE STUDY;  Surgeon: Rogene Houston, MD;  Location: [facility];  Service: Endoscopy;  Laterality: N/A;  . Colonoscopy  02/25/2012    Procedure: COLONOSCOPY;  Surgeon: Rogene Houston, MD;  Location: [facility];  Service: Endoscopy;  Laterality: N/A;  1200    History reviewed. No pertinent family history.  Social History:  reports that she has never smoked. She has never used smokeless tobacco. She reports that she does not drink alcohol or use illicit drugs.  Allergies:  Allergies  Allergen Reactions  . Imatinib Other (See Comments)    Cardiac dysrhythmia  . Cephalexin Swelling  . Dexlansoprazole Swelling  . Latex Itching  . Other Swelling    Patient states that Pecans cause her mouth to swell.  Marland Kitchen Penicillins Swelling  . Tape Itching  . Enoxaparin Sodium Itching, Swelling and Palpitations  . Nylon Rash  . Omeprazole Itching and Rash  . Sulfonamide Derivatives Rash    Medications: I have reviewed the patient's current medications.  Results for orders placed or performed during the hospital encounter of 01/22/15 (from the past 48 hour(s))  CBC with Differential     Status: Abnormal   Collection Time: 01/23/15 12:25 AM  Result Value Ref Range   WBC 10.1 4.0 - 10.5 K/uL   RBC 5.19 (H) 3.87 - 5.11 MIL/uL   Hemoglobin 15.2 (H)  12.0 - 15.0 g/dL   HCT 46.7 (H) 36.0 - 46.0 %   MCV 90.0 78.0 - 100.0 fL   MCH 29.3 26.0 - 34.0 pg   MCHC 32.5 30.0 - 36.0 g/dL   RDW 13.2 11.5 - 15.5 %   Platelets 311 150 - 400 K/uL   Neutrophils Relative % 70 %   Neutro Abs 7.1 1.7 - 7.7 K/uL   Lymphocytes Relative 19 %   Lymphs Abs 1.9 0.7 - 4.0 K/uL   Monocytes Relative 10 %   Monocytes Absolute 1.0 0.1 - 1.0 K/uL   Eosinophils Relative 1 %   Eosinophils Absolute 0.1 0.0 - 0.7 K/uL   Basophils Relative 0 %   Basophils Absolute 0.0 0.0 - 0.1 K/uL  Comprehensive metabolic panel     Status: Abnormal   Collection Time: 01/23/15 12:25 AM   Result Value Ref Range   Sodium 139 135 - 145 mmol/L   Potassium 4.1 3.5 - 5.1 mmol/L   Chloride 105 101 - 111 mmol/L   CO2 26 22 - 32 mmol/L   Glucose, Bld 129 (H) 65 - 99 mg/dL   BUN 14 6 - 20 mg/dL   Creatinine, Ser 0.76 0.44 - 1.00 mg/dL   Calcium 9.7 8.9 - 10.3 mg/dL   Total Protein 8.3 (H) 6.5 - 8.1 g/dL   Albumin 4.2 3.5 - 5.0 g/dL   AST 26 15 - 41 U/L   ALT 31 14 - 54 U/L   Alkaline Phosphatase 77 38 - 126 U/L   Total Bilirubin 0.8 0.3 - 1.2 mg/dL   GFR calc non Af Amer >60 >60 mL/min   GFR calc Af Amer >60 >60 mL/min    Comment: (NOTE) The eGFR has been calculated using the CKD EPI equation. This calculation has not been validated in all clinical situations. eGFR's persistently <60 mL/min signify possible Chronic Kidney Disease.    Anion gap 8 5 - 15  Lipase, blood     Status: None   Collection Time: 01/23/15 12:25 AM  Result Value Ref Range   Lipase 20 11 - 51 U/L  Urinalysis, Routine w reflex microscopic (not at [facility])     Status: Abnormal   Collection Time: 01/23/15 12:25 AM  Result Value Ref Range   Color, Urine YELLOW YELLOW   APPearance CLEAR CLEAR   Specific Gravity, Urine >1.030 (H) 1.005 - 1.030   pH 6.0 5.0 - 8.0   Glucose, UA NEGATIVE NEGATIVE mg/dL   Hgb urine dipstick MODERATE (A) NEGATIVE   Bilirubin Urine NEGATIVE NEGATIVE   Ketones, ur NEGATIVE NEGATIVE mg/dL   Protein, ur NEGATIVE NEGATIVE mg/dL   Nitrite NEGATIVE NEGATIVE   Leukocytes, UA NEGATIVE NEGATIVE  Protime-INR     Status: Abnormal   Collection Time: 01/23/15 12:25 AM  Result Value Ref Range   Prothrombin Time 21.8 (H) 11.6 - 15.2 seconds   INR 1.92 (H) 0.00 - 1.49  Urine microscopic-add on     Status: Abnormal   Collection Time: 01/23/15 12:25 AM  Result Value Ref Range   Squamous Epithelial / LPF 0-5 (A) NONE SEEN   WBC, UA 0-5 0 - 5 WBC/hpf   RBC / HPF 0-5 0 - 5 RBC/hpf   Bacteria, UA MANY (A) NONE SEEN   Crystals CA OXALATE CRYSTALS (A) NEGATIVE    Ct Abdomen Pelvis W  Contrast  01/23/2015  CLINICAL DATA:  64 year old female with abdominal pain and nausea. History of GIST and prior small bowel resection. EXAM: CT  ABDOMEN AND PELVIS WITH CONTRAST TECHNIQUE: Multidetector CT imaging of the abdomen and pelvis was performed using the standard protocol following bolus administration of intravenous contrast. CONTRAST:  140m OMNIPAQUE IOHEXOL 300 MG/ML  SOLN COMPARISON:  Abdominal radiograph dated 01/23/2015 and CT dated 01/10/2015 FINDINGS: The visualized lung bases are clear. No intra-abdominal free air. Trace free fluid within the pelvis. The liver, gallbladder, pancreas, spleen, and the right adrenal gland appear unremarkable. There is a stable appearing 3 cm left adrenal hypodense nodule, indeterminate, likely an adenoma. MRI may provide better evaluation. There bilateral extrarenal pelvis. There is mild fullness of the right renal pelvis. There is no hydronephrosis on either side. Small stable right renal hypodense lesion most compatible with a cyst. The visualized ureters appear unremarkable. The urinary bladder is only partially distended. There is apparent diffuse thickening of the bladder wall which may be partly related to underdistention. Cystitis is not excluded. Correlation with urinalysis recommended. Hysterectomy. There is a small hiatal hernia. This postsurgical changes of partial small bowel resection with an ileo ileal anastomosis in the anterior pelvis. The anastomosis appears patent. There is a focal area of abrupt caliber change within the small bowel 1 cm distal to the anastomosis (series 2 image 69) compatible with small bowel obstruction. There is abutment of this loop of bowel to the anterior peritoneal wall compatible with adhesions. There is mild dilatation and fecalization of the small bowel segment within the anterior pelvis. The distal small bowel and terminal ileum are collapsed. There is colonic diverticulosis without active inflammation. A fecalith is  noted in the proximal transverse colon within a diverticulum. The appendix is unremarkable. The abdominal aorta and IVC appear patent. The left common iliac vein is not visualized and may be atrophic or aplastic. The no portal venous gas identified. There is mild haziness of the mesentery with multiple top-normal lymph nodes with a "misty mesentery" appearance. This finding is nonspecific but may be related to underlying inflammatory/infectious etiology. There is no adenopathy. Midline vertical anterior pelvic wall incisional scar. A small fat containing left paraumbilical hernia noted. There is stable prominence of the vasculature in the subcutaneous soft tissues of the anterior pelvic wall. There is degenerative changes of the spine. No acute fracture. IMPRESSION: Small bowel obstruction with a transition zone just distal to the ileo ileal anastomosis in the anterior pelvis and secondary to peritoneal adhesions. Electronically Signed   By: AAnner CreteM.D.   On: 01/23/2015 02:17   Dg Abd Acute W/chest  01/23/2015  CLINICAL DATA:  64year old female with abdominal pain and cramping. EXAM: DG ABDOMEN ACUTE W/ 1V CHEST COMPARISON:  CT dated 01/10/2015 FINDINGS: There is moderate stool throughout the colon. A loop of small bowel noted in the left hemi abdomen measuring up to 3 cm 3.2 cm in caliber. A 1.4 cm round density along the medial aspect of the ascending colon likely represents a fecalith. No definite free air identified. No radiopaque densities noted over the renal silhouettes. There is degenerative changes of the spine. No acute fracture. IMPRESSION: Constipation. Minimally dilated loop of small bowel in the left lower abdomen may represent a segmental ileus. There is no definite evidence of bowel obstruction at this time. Clinical correlation recommended. Probable fecalith in the ascending colon. Electronically Signed   By: AAnner CreteM.D.   On: 01/23/2015 01:16    ROS: See chart Blood  pressure 139/73, pulse 65, temperature 97.5 F (36.4 C), temperature source Oral, resp. rate 18, height '5\' 7"'  (1.702 m),  weight 112.3 kg (247 lb 9.2 oz), SpO2 96 %. Physical Exam: Pleasant white female in no acute distress. Abdomen is soft, nontender, nondistended. No hepatosplenomegaly or masses or hernias are noted. No rigidity is noted.  Assessment/Plan: Impression: Partial small bowel obstruction secondary to adhesive disease Plan: No need for acute surgical intervention at this time. Agree with full liquid diet. Will add Muralax. No need for NG tube. We will follow with you.  Anyelin Mogle A 01/23/2015, 10:57 AM

## 2015-01-23 NOTE — Care Management Note (Signed)
Case Management Note  Patient Details  Name: [name] MRN: [id number] Date of Birth: Aug 20, 1950  Subjective/Objective:                  Pt is from home and ind with ADL's. Pt admitted for SBO. Pt plans to return home with self care.   Action/Plan: No CM needs.   Expected Discharge Date:  01/26/15               Expected Discharge Plan:  Home/Self Care  In-House Referral:  NA  Discharge planning Services  CM Consult  Post Acute Care Choice:  NA Choice offered to:  NA  DME Arranged:    DME Agency:     HH Arranged:    HH Agency:     Status of Service:  Completed, signed off  Medicare Important Message Given:    Date Medicare IM Given:    Medicare IM give by:    Date Additional Medicare IM Given:    Additional Medicare Important Message give by:     If discussed at [place] of Stay Meetings, dates discussed:    Additional Comments:  Sherald Barge, RN 01/23/2015, 2:33 PM

## 2015-01-24 DIAGNOSIS — D6851 Activated protein C resistance: Secondary | ICD-10-CM

## 2015-01-24 DIAGNOSIS — Z7901 Long term (current) use of anticoagulants: Secondary | ICD-10-CM

## 2015-01-24 LAB — CBC
HCT: 42.8 % (ref 36.0–46.0)
Hemoglobin: 13.7 g/dL (ref 12.0–15.0)
MCH: 29.2 pg (ref 26.0–34.0)
MCHC: 32 g/dL (ref 30.0–36.0)
MCV: 91.3 fL (ref 78.0–100.0)
PLATELETS: 289 10*3/uL (ref 150–400)
RBC: 4.69 MIL/uL (ref 3.87–5.11)
RDW: 13.6 % (ref 11.5–15.5)
WBC: 5.7 10*3/uL (ref 4.0–10.5)

## 2015-01-24 LAB — BASIC METABOLIC PANEL
Anion gap: 4 — ABNORMAL LOW (ref 5–15)
BUN: 9 mg/dL (ref 6–20)
CO2: 29 mmol/L (ref 22–32)
CREATININE: 0.66 mg/dL (ref 0.44–1.00)
Calcium: 8.9 mg/dL (ref 8.9–10.3)
Chloride: 108 mmol/L (ref 101–111)
Glucose, Bld: 102 mg/dL — ABNORMAL HIGH (ref 65–99)
POTASSIUM: 3.8 mmol/L (ref 3.5–5.1)
SODIUM: 141 mmol/L (ref 135–145)

## 2015-01-24 LAB — HEPARIN LEVEL (UNFRACTIONATED): Heparin Unfractionated: 0.33 IU/mL (ref 0.30–0.70)

## 2015-01-24 NOTE — Progress Notes (Signed)
Subjective: Patient has no abdominal pain. Did have a small bowel movement. Tolerating clear liquid diet well.  Objective: Vital signs in last 24 hours: Temp:  [97.9 F (36.6 C)-98 F (36.7 C)] 97.9 F (36.6 C) (12/29 0603) Pulse Rate:  [54-65] 54 (12/29 0603) Resp:  [18-54] 54 (12/29 0603) BP: (116-127)/(61-72) 127/72 mmHg (12/29 0603) SpO2:  [95 %] 95 % (12/29 0603) Last BM Date: 01/22/15  Intake/Output from previous day: 12/28 0701 - 12/29 0700 In: 2312.1 [P.O.:360; I.V.:1952.1] Out: -  Intake/Output this shift:    General appearance: alert, cooperative and no distress GI: soft, non-tender; bowel sounds normal; no masses,  no organomegaly  Lab Results:   Recent Labs  01/23/15 0025 01/24/15 0707  WBC 10.1 5.7  HGB 15.2* 13.7  HCT 46.7* 42.8  PLT 311 289   BMET  Recent Labs  01/23/15 0025 01/24/15 0707  NA 139 141  K 4.1 3.8  CL 105 108  CO2 26 29  GLUCOSE 129* 102*  BUN 14 9  CREATININE 0.76 0.66  CALCIUM 9.7 8.9   PT/INR  Recent Labs  01/23/15 0025  LABPROT 21.8*  INR 1.92*    Studies/Results: Ct Abdomen Pelvis W Contrast  01/23/2015  CLINICAL DATA:  64 year old female with abdominal pain and nausea. History of GIST and prior small bowel resection. EXAM: CT ABDOMEN AND PELVIS WITH CONTRAST TECHNIQUE: Multidetector CT imaging of the abdomen and pelvis was performed using the standard protocol following bolus administration of intravenous contrast. CONTRAST:  138mL OMNIPAQUE IOHEXOL 300 MG/ML  SOLN COMPARISON:  Abdominal radiograph dated 01/23/2015 and CT dated 01/10/2015 FINDINGS: The visualized lung bases are clear. No intra-abdominal free air. Trace free fluid within the pelvis. The liver, gallbladder, pancreas, spleen, and the right adrenal gland appear unremarkable. There is a stable appearing 3 cm left adrenal hypodense nodule, indeterminate, likely an adenoma. MRI may provide better evaluation. There bilateral extrarenal pelvis. There is mild  fullness of the right renal pelvis. There is no hydronephrosis on either side. Small stable right renal hypodense lesion most compatible with a cyst. The visualized ureters appear unremarkable. The urinary bladder is only partially distended. There is apparent diffuse thickening of the bladder wall which may be partly related to underdistention. Cystitis is not excluded. Correlation with urinalysis recommended. Hysterectomy. There is a small hiatal hernia. This postsurgical changes of partial small bowel resection with an ileo ileal anastomosis in the anterior pelvis. The anastomosis appears patent. There is a focal area of abrupt caliber change within the small bowel 1 cm distal to the anastomosis (series 2 image 69) compatible with small bowel obstruction. There is abutment of this loop of bowel to the anterior peritoneal wall compatible with adhesions. There is mild dilatation and fecalization of the small bowel segment within the anterior pelvis. The distal small bowel and terminal ileum are collapsed. There is colonic diverticulosis without active inflammation. A fecalith is noted in the proximal transverse colon within a diverticulum. The appendix is unremarkable. The abdominal aorta and IVC appear patent. The left common iliac vein is not visualized and may be atrophic or aplastic. The no portal venous gas identified. There is mild haziness of the mesentery with multiple top-normal lymph nodes with a "misty mesentery" appearance. This finding is nonspecific but may be related to underlying inflammatory/infectious etiology. There is no adenopathy. Midline vertical anterior pelvic wall incisional scar. A small fat containing left paraumbilical hernia noted. There is stable prominence of the vasculature in the subcutaneous soft tissues of the  anterior pelvic wall. There is degenerative changes of the spine. No acute fracture. IMPRESSION: Small bowel obstruction with a transition zone just distal to the ileo  ileal anastomosis in the anterior pelvis and secondary to peritoneal adhesions. Electronically Signed   By: Anner Crete M.D.   On: 01/23/2015 02:17   Dg Abd Acute W/chest  01/23/2015  CLINICAL DATA:  64 year old female with abdominal pain and cramping. EXAM: DG ABDOMEN ACUTE W/ 1V CHEST COMPARISON:  CT dated 01/10/2015 FINDINGS: There is moderate stool throughout the colon. A loop of small bowel noted in the left hemi abdomen measuring up to 3 cm 3.2 cm in caliber. A 1.4 cm round density along the medial aspect of the ascending colon likely represents a fecalith. No definite free air identified. No radiopaque densities noted over the renal silhouettes. There is degenerative changes of the spine. No acute fracture. IMPRESSION: Constipation. Minimally dilated loop of small bowel in the left lower abdomen may represent a segmental ileus. There is no definite evidence of bowel obstruction at this time. Clinical correlation recommended. Probable fecalith in the ascending colon. Electronically Signed   By: Anner Crete M.D.   On: 01/23/2015 01:16    Anti-infectives: Anti-infectives    None      Assessment/Plan: Impression: Small bowel obstruction, resolving. No need for acute surgical intervention. Plan: We'll advance to full liquid diet. Patient may be advanced once her bowel function fully returns.  LOS: 1 day    Abass Misener A 01/24/2015

## 2015-01-24 NOTE — Progress Notes (Signed)
PROGRESS NOTE  [name] [id number] DOB: [birth year] DOA: 01/22/2015 PCP: [name], MD  Summary: 64 yof PMHx of small bowel resection in 2012 for small bowel cancer status post chemotherapy as well, factor V Leiden, DVTs, and esophageal ulcers presented with abdominal pain. Abdominal CT revealed a small bowel obstruction. She was admitted for further management.   Assessment/Plan: 1. SBO with possible adhesions. Resolving with supportive care. General surgery following. 2. PMH Stage II GIST, PMH small bowel resection 3. Heterozygote for factor V Leiden with history of DVTs in the left leg. On chronic Coumadin therapy as outpt. On heparin infusion as inpt. 4. PMH esophageal ulcer, GERD 5. Pernicious anemia, stable.    Overall improved. Continue conservative management.  Advance diet as tolerated, decrease IVF.   Likely home 12/30  Code Status: Full DVT prophylaxis: Heparin  Family Communication: Family at bedside. Disposition Plan: Anticipate discharge in 1-2 days.   Murray Hodgkins, MD  [facility]  Pager [phone] If 7PM-7AM, please contact night-coverage at [email or web address], password [facility] 01/24/2015, 7:22 AM  LOS: 1 day   Consultants:  General surgery  Procedures:    Antibiotics:    HPI/Subjective: Feels better. Denies any pain, nausea, or vomiting. Tolerating liquids. Passing gas, small BM.  Objective: Filed Vitals:   01/23/15 0710 01/23/15 0841 01/23/15 2129 01/24/15 0603  BP: 139/73 139/73 116/61 127/72  Pulse: 65 65 65 54  Temp: 97.5 F (36.4 C) 97.5 F (36.4 C) 98 F (36.7 C) 97.9 F (36.6 C)  TempSrc: Oral Oral Oral Oral  Resp: 18  18 54  Height: 5\' 7"  (1.702 m) 5\' 7"  (1.702 m)    Weight: 112.3 kg (247 lb 9.2 oz) 112.3 kg (247 lb 9.2 oz)    SpO2: 96% 96% 95% 95%    Intake/Output Summary (Last 24 hours) at 01/24/15 0722 Last data filed at 01/24/15 0350  Gross per 24 hour  Intake 2312.08 ml  Output      0 ml  Net 2312.08 ml       Filed Weights   01/22/15 2312 01/23/15 0710 01/23/15 0841  Weight: 111.131 kg (245 lb) 112.3 kg (247 lb 9.2 oz) 112.3 kg (247 lb 9.2 oz)    Exam:   VSS, afebrile General:  Appears calm and comfortable Cardiovascular: RRR, no m/r/g. No LE edema. Respiratory: CTA bilaterally, no w/r/r. Normal respiratory effort. Abdomen: soft, ntnd, normal bowel sounds Psychiatric: grossly normal mood and affect, speech fluent and appropriate   New data reviewed:  BMP and CBC unremarkable.   Pertinent data since admission:  Abdominal CT -  Small bowel obstruction with a transition zone just distal to the ileo ileal anastomosis in the anterior pelvis and secondary to peritoneal adhesions.  CMP unremarkable  CBC unremarkable  INR 1.92   Pending data:  UC  Scheduled Meds: . metoprolol tartrate  12.5 mg Oral BID  . polyethylene glycol  17 g Oral Daily   Continuous Infusions: . dextrose 5 % and 0.45% NaCl 100 mL/hr at 01/24/15 0350  . heparin 1,300 Units/hr (01/24/15 0351)    Principal Problem:   SBO (small bowel obstruction) ([place]) Active Problems:   DVT (deep venous thrombosis) ([name])   Factor V Leiden ([place])   Chronic anticoagulation   Small bowel obstruction ([place])   Time spent 20 minutes   By signing my name below, I, Rosalie Doctor attest that this documentation has been prepared under the direction and in the presence of Murray Hodgkins, MD Electronically signed: Rosalie Doctor, Scribe.  01/24/2015 9:16am   I personally performed the services described in this documentation. All medical record entries made by the scribe were at my direction. I have reviewed the chart and agree that the record reflects my personal performance and is accurate and complete. Murray Hodgkins, MD

## 2015-01-24 NOTE — Progress Notes (Signed)
ANTICOAGULATION CONSULT NOTE - follow up  Pharmacy Consult for Heparin Indication: h/o DVT's, factor V leiden, on chronic Coumadin  Allergies  Allergen Reactions  . Imatinib Other (See Comments)    Cardiac dysrhythmia  . Penicillins Anaphylaxis    Has patient had a PCN reaction causing immediate rash, facial/tongue/throat swelling, SOB or lightheadedness with hypotension: No no Has patient had a PCN reaction causing severe rash involving mucus membranes or skin necrosis: No Has patient had a PCN reaction that required hospitalization No Has patient had a PCN reaction occurring within the last 10 years: No If all of the above answers are "NO", then may proceed with Cephalosporin use.   . Cephalexin Swelling  . Dexlansoprazole Swelling  . Latex Itching  . Other Swelling    Patient states that Pecans cause her mouth to swell.  . Tape Itching  . Enoxaparin Sodium Itching, Swelling and Palpitations  . Nylon Rash  . Omeprazole Itching and Rash  . Sulfonamide Derivatives Rash   Patient Measurements: Height: 5\' 7"  (170.2 cm) Weight: 247 lb 9.2 oz (112.3 kg) IBW/kg (Calculated) : 61.6 HEPARIN DW (KG): 87.6  Vital Signs: Temp: 97.9 F (36.6 C) (12/29 0603) Temp Source: Oral (12/29 0603) BP: 127/72 mmHg (12/29 0603) Pulse Rate: 61 (12/29 0928)  Labs:  Recent Labs  01/23/15 0025 01/23/15 1443 01/24/15 0707 01/24/15 0708  HGB 15.2*  --  13.7  --   HCT 46.7*  --  42.8  --   PLT 311  --  289  --   LABPROT 21.8*  --   --   --   INR 1.92*  --   --   --   HEPARINUNFRC  --  0.29*  --  0.33  CREATININE 0.76  --  0.66  --     Estimated Creatinine Clearance: 93.1 mL/min (by C-G formula based on Cr of 0.66).   Medical History: Past Medical History  Diagnosis Date  . Vitamin B12 deficiency     vit b12 1000 mcg monthly  . Cellulitis of left leg 2006  . Ulcer 05/2009    esophageal  . Clotting disorder ([place])     heterozygosity from factor v leiden  . Pernicious anemia  07/10/2010  . DVT (deep venous thrombosis) ([place]) 07/10/2010    on coumadin  . Factor V Leiden ([place])   . Anxiety   . 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 Allergic urticaria 01/04/2011    Rash from tape.  Marland Kitchen GERD (gastroesophageal reflux disease)   . Ventricular tachycardia ([place]) 03/26/11    Medications:  Prescriptions prior to admission  Medication Sig Dispense Refill Last Dose  . acetaminophen (TYLENOL) 500 MG tablet Take 500-1,000 mg by mouth every 6 (six) hours as needed for moderate pain.   unknown  . cyanocobalamin (,VITAMIN B-12,) 1000 MCG/ML injection Inject 1 mL (1,000 mcg total) into the muscle every 30 (thirty) days. 10 mL 6 Past Month at Unknown time  . diphenhydrAMINE (BENADRYL) 25 mg capsule Take 25 mg by mouth every 6 (six) hours as needed for allergies.   unknown  . ferrous sulfate 325 (65 FE) MG tablet Take 325 mg by mouth 3 (three) times a week.   Past Week at Unknown time  . lansoprazole (PREVACID) 15 MG capsule Take 15 mg by mouth daily.    01/22/2015 at Unknown time  . metoprolol tartrate (LOPRESSOR) 25 MG tablet Take 1 tablet (25 mg total) by mouth daily. Take 1/2 Tablet Two  times Daily (Patient taking differently: Take 12.5 mg by mouth 2 (two) times daily. ) 90 tablet 3 01/22/2015 at 1730  . warfarin (COUMADIN) 5 MG tablet Take 1 tablet (5 mg total) by mouth daily. 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 No history of nausea or vomiting the patient had 4 bowel movements since yesterday and the bowel movements are not diarrheic. Patient denies abdominal trauma, no cough shortness of breath or chest pain. The patient is on Coumadin for history of factor V Ley[place] and history of DVTs.  Asked to initiate Heparin while Coumadin on hold.  Per  notes, no plans for surgery at this time for SBO.  Heparin level is therapeutic.  CBC ok.  Goal of Therapy:  Heparin level 0.3-0.7 units/ml Monitor platelets by anticoagulation protocol: Yes   Plan:  Heparin infusion at 1350 units/hr Heparin level daily CBC daily while on Heparin  [place] Crane, Oney Tatlock A 01/24/2015,10:51 AM

## 2015-01-24 NOTE — Clinical Documentation Improvement (Signed)
Internal Medicine  Abnormal Lab/Test Results:  80,000 COLONIES/ml ESCHERICHIA COLI   Possible Clinical Conditions associated with below indicators    UTI due E Coli  Other Condition  Cannot Clinically Determine   Supporting Information:  lab value above   Please exercise your independent, professional judgment when responding. A specific answer is not anticipated or expected.   Thank You,  Laureles [phone]

## 2015-01-25 LAB — PROTIME-INR
INR: 1.72 — AB (ref 0.00–1.49)
PROTHROMBIN TIME: 20.1 s — AB (ref 11.6–15.2)

## 2015-01-25 LAB — CBC
HEMATOCRIT: 41.4 % (ref 36.0–46.0)
HEMOGLOBIN: 13.1 g/dL (ref 12.0–15.0)
MCH: 28.7 pg (ref 26.0–34.0)
MCHC: 31.6 g/dL (ref 30.0–36.0)
MCV: 90.6 fL (ref 78.0–100.0)
Platelets: 250 10*3/uL (ref 150–400)
RBC: 4.57 MIL/uL (ref 3.87–5.11)
RDW: 13.3 % (ref 11.5–15.5)
WBC: 4.4 10*3/uL (ref 4.0–10.5)

## 2015-01-25 LAB — URINE CULTURE: Culture: 80000

## 2015-01-25 LAB — HEPARIN LEVEL (UNFRACTIONATED): HEPARIN UNFRACTIONATED: 0.44 [IU]/mL (ref 0.30–0.70)

## 2015-01-25 NOTE — Discharge Summary (Addendum)
Physician Discharge Summary  [name] [id number] DOB: May 31, 1950 DOA: 01/22/2015  PCP: [name], MD  Admit date: 01/22/2015 Discharge date: 01/25/2015  Discharge Diagnoses:  1. SBO   2. Asymptomatic bacteriuria.  3. PMH Stage II GIST, PMH small bowel resection 4. Heterozygote for factor V Leiden with history of DVTs in the left leg. 5. PMH esophageal ulcer, GERD 6. Pernicious anemia.  Discharge Condition: Improved Disposition: Home  Diet recommendation: Regular  Filed Weights   01/22/15 2312 01/23/15 0710 01/23/15 0841  Weight: 111.131 kg (245 lb) 112.3 kg (247 lb 9.2 oz) 112.3 kg (247 lb 9.2 oz)    History of present illness:  64 yof PMHx of small bowel resection in 2012 for small bowel cancer status post chemotherapy as well, factor V Leiden, DVTs, and esophageal ulcers presented with abdominal pain. Abdominal CT revealed a small bowel obstruction.    Hospital Course:  General surgery was consulted and recommended conservative management for SBO. Patient was placed on bowel rest with antiemetics and pain medications as needed. Her abdominal pain is now resolved and she is having normal BMs and passing flatus. She is tolerating a normal diet without any complications. Hospitalization was uncomplicated.  Individual issues as below:  1. SBO with possible adhesions. Resolved with supportive care. Tolerating normal diet well. Bowels are moving. 2. Asymptomatic bacteriuria. Culture pending. Given she is asymptomatic, will not treat.  3. PMH Stage II GIST, PMH small bowel resection. 4. Heterozygote for factor V Leiden with history of DVTs in the left leg (63 years ago). On chronic Coumadin therapy as outpt. On heparin infusion as inpt.  5. PMH esophageal ulcer, GERD 6. Pernicious anemia, stable.  Consultants:  General surgery  Procedures:  none  Antibiotics:  None   Discharge Instructions Discharge Instructions    Activity as tolerated - No  restrictions    Complete by:  As directed      Diet general    Complete by:  As directed      Discharge instructions    Complete by:  As directed   Call your physician or seek immediate medical attention for pain, fever, constipation, vomiting or worsening of condition.            Discharge Medication List as of 01/25/2015 12:53 PM    CONTINUE these medications which have NOT CHANGED   Details  acetaminophen (TYLENOL) 500 MG tablet Take 500-1,000 mg by mouth every 6 (six) hours as needed for moderate pain., Until Discontinued, Historical Med    cyanocobalamin (,VITAMIN B-12,) 1000 MCG/ML injection Inject 1 mL (1,000 mcg total) into the muscle every 30 (thirty) days., Starting 02/01/2013, Until Discontinued, Normal    diphenhydrAMINE (BENADRYL) 25 mg capsule Take 25 mg by mouth every 6 (six) hours as needed for allergies., Until Discontinued, Historical Med    ferrous sulfate 325 (65 FE) MG tablet Take 325 mg by mouth 3 (three) times a week., Until Discontinued, Historical Med    lansoprazole (PREVACID) 15 MG capsule Take 15 mg by mouth daily. , Until Discontinued, Historical Med    metoprolol tartrate (LOPRESSOR) 25 MG tablet Take 1 tablet (25 mg total) by mouth daily. Take 1/2 Tablet Two times Daily, Starting 05/16/2014, Until Discontinued, Normal    warfarin (COUMADIN) 5 MG tablet Take 1 tablet (5 mg total) by mouth daily., Starting 09/04/2014, Until Discontinued, Normal       Allergies  Allergen Reactions  . Imatinib Other (See Comments)    Cardiac dysrhythmia  . Penicillins  Anaphylaxis    Has patient had a PCN reaction causing immediate rash, facial/tongue/throat swelling, SOB or lightheadedness with hypotension: No no Has patient had a PCN reaction causing severe rash involving mucus membranes or skin necrosis: No Has patient had a PCN reaction that required hospitalization No Has patient had a PCN reaction occurring within the last 10 years: No If all of the above answers  are "NO", then may proceed with Cephalosporin use.   . Cephalexin Swelling  . Dexlansoprazole Swelling  . Latex Itching  . Other Swelling    Patient states that Pecans cause her mouth to swell.  . Tape Itching  . Enoxaparin Sodium Itching, Swelling and Palpitations  . Nylon Rash  . Omeprazole Itching and Rash  . Sulfonamide Derivatives Rash    The results of significant diagnostics from this hospitalization (including imaging, microbiology, ancillary and laboratory) are listed below for reference.    Significant Diagnostic Studies: Ct Abdomen Pelvis W Contrast  01/23/2015  CLINICAL DATA:  64 year old female with abdominal pain and nausea. History of GIST and prior small bowel resection. EXAM: CT ABDOMEN AND PELVIS WITH CONTRAST TECHNIQUE: Multidetector CT imaging of the abdomen and pelvis was performed using the standard protocol following bolus administration of intravenous contrast. CONTRAST:  165mL OMNIPAQUE IOHEXOL 300 MG/ML  SOLN COMPARISON:  Abdominal radiograph dated 01/23/2015 and CT dated 01/10/2015 FINDINGS: The visualized lung bases are clear. No intra-abdominal free air. Trace free fluid within the pelvis. The liver, gallbladder, pancreas, spleen, and the right adrenal gland appear unremarkable. There is a stable appearing 3 cm left adrenal hypodense nodule, indeterminate, likely an adenoma. MRI may provide better evaluation. There bilateral extrarenal pelvis. There is mild fullness of the right renal pelvis. There is no hydronephrosis on either side. Small stable right renal hypodense lesion most compatible with a cyst. The visualized ureters appear unremarkable. The urinary bladder is only partially distended. There is apparent diffuse thickening of the bladder wall which may be partly related to underdistention. Cystitis is not excluded. Correlation with urinalysis recommended. Hysterectomy. There is a small hiatal hernia. This postsurgical changes of partial small bowel resection  with an ileo ileal anastomosis in the anterior pelvis. The anastomosis appears patent. There is a focal area of abrupt caliber change within the small bowel 1 cm distal to the anastomosis (series 2 image 69) compatible with small bowel obstruction. There is abutment of this loop of bowel to the anterior peritoneal wall compatible with adhesions. There is mild dilatation and fecalization of the small bowel segment within the anterior pelvis. The distal small bowel and terminal ileum are collapsed. There is colonic diverticulosis without active inflammation. A fecalith is noted in the proximal transverse colon within a diverticulum. The appendix is unremarkable. The abdominal aorta and IVC appear patent. The left common iliac vein is not visualized and may be atrophic or aplastic. The no portal venous gas identified. There is mild haziness of the mesentery with multiple top-normal lymph nodes with a "misty mesentery" appearance. This finding is nonspecific but may be related to underlying inflammatory/infectious etiology. There is no adenopathy. Midline vertical anterior pelvic wall incisional scar. A small fat containing left paraumbilical hernia noted. There is stable prominence of the vasculature in the subcutaneous soft tissues of the anterior pelvic wall. There is degenerative changes of the spine. No acute fracture. IMPRESSION: Small bowel obstruction with a transition zone just distal to the ileo ileal anastomosis in the anterior pelvis and secondary to peritoneal adhesions. Electronically Signed   By:  Anner Crete M.D.   On: 01/23/2015 02:17   Ct Abdomen Pelvis W Contrast  01/10/2015  CLINICAL DATA:  Restaging GIST tumor EXAM: CT ABDOMEN AND PELVIS WITH CONTRAST TECHNIQUE: Multidetector CT imaging of the abdomen and pelvis was performed using the standard protocol following bolus administration of intravenous contrast. CONTRAST:  144mL OMNIPAQUE IOHEXOL 300 MG/ML  SOLN COMPARISON:  01/15/2014 FINDINGS:  Lower chest:  No pleural fluid.  The lung bases appear clear. Hepatobiliary: There is no focal liver abnormality. The gallbladder appears normal. No gallstones. No biliary dilatation. Pancreas: Unremarkable appearance of the pancreas. Spleen: Negative. Adrenals/Urinary Tract: Normal appearance of the right adrenal gland. Nodule in the left adrenal gland measures 24.3 Hounsfield units and 3.1 cm, image 26/ series 2. Unchanged from previous exam and favored to represent a benign adenoma. Bilateral extra renal pelvis noted. Similar appearance of right renal cyst. The urinary bladder appears normal. Stomach/Bowel: Small hiatal hernia. The stomach is otherwise normal in appearance. No pathologic dilatation of the small or large bowel loops identified. Vascular/Lymphatic: Normal appearance of the abdominal aorta. The left iliac vein is chronically occluded. There is enlargement of the right iliac vein and a large varix extends from the right common femoral vein to the left common femoral vein. No enlarged retroperitoneal or mesenteric adenopathy. No enlarged pelvic or inguinal lymph nodes. Reproductive: Previous hysterectomy.  No adnexal mass identified. Other: No free fluid or fluid collections identified. Stable appearance of mild haziness within the small bowel mesenteric. Musculoskeletal: Lumbar spondylosis noted. No aggressive lytic or sclerotic bone lesions noted. IMPRESSION: 1. Stable exam. No evidence for recurrent disease within the abdomen or pelvis. 2. Stable left adrenal gland adenoma. 3. 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 Electronically Signed   By: Kerby Moors M.D.   On: 01/10/2015 11:52   Dg Abd Acute W/chest  01/23/2015  CLINICAL DATA:  64 year old female with abdominal pain and cramping. EXAM: DG ABDOMEN ACUTE W/ 1V CHEST COMPARISON:  CT dated 01/10/2015 FINDINGS: There is moderate stool throughout the colon. A loop of small bowel noted in the left hemi abdomen measuring up to 3 cm  3.2 cm in caliber. A 1.4 cm round density along the medial aspect of the ascending colon likely represents a fecalith. No definite free air identified. No radiopaque densities noted over the renal silhouettes. There is degenerative changes of the spine. No acute fracture. IMPRESSION: Constipation. Minimally dilated loop of small bowel in the left lower abdomen may represent a segmental ileus. There is no definite evidence of bowel obstruction at this time. Clinical correlation recommended. Probable fecalith in the ascending colon. Electronically Signed   By: Anner Crete M.D.   On: 01/23/2015 01:16    Microbiology: Recent Results (from the past 240 hour(s))  Urine culture     Status: None (Preliminary result)   Collection Time: 01/23/15 12:25 AM  Result Value Ref Range Status   Specimen Description URINE, CLEAN CATCH  Final   Special Requests NONE  Final   Culture   Final    80,000 COLONIES/ml ESCHERICHIA COLI Performed at [facility]    Report Status PENDING  Incomplete     Labs: Basic Metabolic Panel:  Recent Labs Lab 01/23/15 0025 01/24/15 0707  NA 139 141  K 4.1 3.8  CL 105 108  CO2 26 29  GLUCOSE 129* 102*  BUN 14 9  CREATININE 0.76 0.66  CALCIUM 9.7 8.9   Liver Function Tests:  Recent Labs Lab 01/23/15 0025  AST 26  ALT 31  ALKPHOS 77  BILITOT 0.8  PROT 8.3*  ALBUMIN 4.2    Recent Labs Lab 01/23/15 0025  LIPASE 20    CBC:  Recent Labs Lab 01/23/15 0025 01/24/15 0707 01/25/15 0703  WBC 10.1 5.7 4.4  NEUTROABS 7.1  --   --   HGB 15.2* 13.7 13.1  HCT 46.7* 42.8 41.4  MCV 90.0 91.3 90.6  PLT 311 289 250     Principal Problem:   SBO (small bowel obstruction) ([name]) Active Problems:   DVT (deep venous thrombosis) ([name])   Factor V Leiden ([name])   Chronic anticoagulation   Small bowel obstruction ([place])   Time coordinating discharge: 20 minutes  Signed:  Murray Hodgkins, MD [facility] 01/25/2015, 8:22 AM  By signing my  name below, I, Rosalie Doctor attest that this documentation has been prepared under the direction and in the presence of Murray Hodgkins, MD Electronically signed: Rosalie Doctor, Scribe.  01/25/2015  I personally performed the services described in this documentation. All medical record entries made by the scribe were at my direction. I have reviewed the chart and agree that the record reflects my personal performance and is accurate and complete. Murray Hodgkins, MD

## 2015-01-25 NOTE — Care Management Note (Signed)
Case Management Note  Patient Details  Name: [name] MRN: [id number] Date of Birth: August 03, 1950   Expected Discharge Date:  01/26/15               Expected Discharge Plan:  Home/Self Care  In-House Referral:  NA  Discharge planning Services  CM Consult  Post Acute Care Choice:  NA Choice offered to:  NA  DME Arranged:    DME Agency:     HH Arranged:    [place] Agency:     Status of Service:  Completed, signed off  Medicare Important Message Given:  N/A - LOS <3 / Initial given by admissions Date Medicare IM Given:    Medicare IM give by:    Date Additional Medicare IM Given:    Additional Medicare Important Message give by:     If discussed at Time of Stay Meetings, dates discussed:    Additional Comments: Pt is discharging home today with self care. No CM needs.   Sherald Barge, RN 01/25/2015, 11:41 AM

## 2015-01-25 NOTE — Care Management Important Message (Signed)
Important Message  Patient Details  Name: [name] MRN: [id number] Date of Birth: 03/08/1950   Medicare Important Message Given:  N/A - LOS <3 / Initial given by admissions    Sherald Barge, RN 01/25/2015, 11:40 AM

## 2015-01-25 NOTE — Progress Notes (Signed)
Patient's IV removed.  Site WNL.  AVS reviewed with patient.  Verbalized understanding of discharge instructions, physician follow-up, medications.  Patient reports belongings intact and in possession at time of discharge.  Patient escorted by NT to main entrance for discharge.  Patient refused wheelchair.  Ambulated to main entrance for discharge.  Patient stable at time of discharge.

## 2015-01-25 NOTE — Progress Notes (Signed)
PROGRESS NOTE  [name] [id number] DOB: 1951-01-14 DOA: 01/22/2015 PCP: [name], MD  Summary: 85 yof PMHx of small bowel resection in 2012 for small bowel cancer status post chemotherapy as well, factor V Leiden, DVTs, and esophageal ulcers presented with abdominal pain. Abdominal CT revealed a small bowel obstruction. She was admitted for further management.   Assessment/Plan: 1. SBO with possible adhesions. Resolved with supportive care. Tolerating normal diet well. Bowels are moving. 2. Asymptomatic bacteriuria. Culture pending. Given she is asymptomatic, will not treat.  3. PMH Stage II GIST, PMH small bowel resection. 4. Heterozygote for factor V Leiden with history of DVTs in the left leg. On chronic Coumadin therapy as outpt. On heparin infusion as inpt.  5. PMH esophageal ulcer, GERD 6. Pernicious anemia, stable.    Overall improved. Discharge home today.   Code Status: Full DVT prophylaxis: Heparin  Family Communication:  No family at bedside.  Disposition Plan: Anticipate discharge today.  Murray Hodgkins, MD  [facility]  Pager [phone] If 7PM-7AM, please contact night-coverage at [email or web address], password [facility] 01/25/2015, 7:36 AM  LOS: 2 days   Consultants:  General surgery  Procedures:    Antibiotics:    HPI/Subjective: Feels good. Denies any pain, nausea, or vomiting. Has been having BMs. Tolerating a normal diet. No dysuria or bladder complaints.  Objective: Filed Vitals:   01/24/15 1151 01/24/15 1424 01/24/15 2100 01/25/15 0634  BP:  137/62 150/66 144/72  Pulse:  62 61 61  Temp:  98 F (36.7 C) 98.5 F (36.9 C) 97.9 F (36.6 C)  TempSrc:   Oral Oral  Resp:  16  18  Height:      Weight:      SpO2: 97% 99% 96% 97%    Intake/Output Summary (Last 24 hours) at 01/25/15 0736 Last data filed at 01/24/15 1800  Gross per 24 hour  Intake 2123.3 ml  Output      0 ml  Net 2123.3 ml     Filed Weights   01/22/15 2312  01/23/15 0710 01/23/15 0841  Weight: 111.131 kg (245 lb) 112.3 kg (247 lb 9.2 oz) 112.3 kg (247 lb 9.2 oz)    Exam:   VSS, afebrile General:  Appears comfortable, calm. Cardiovascular: Regular rate and rhythm, no murmur, rub or gallop. No lower extremity edema. Respiratory: Clear to auscultation bilaterally, no wheezes, rales or rhonchi. Normal respiratory effort. Psychiatric: grossly normal mood and affect, speech fluent and appropriate   New data reviewed:  CBC unremarkable.   Pertinent data since admission:  Abdominal CT -  Small bowel obstruction with a transition zone just distal to the ileo ileal anastomosis in the anterior pelvis and secondary to peritoneal adhesions.  CMP unremarkable  CBC unremarkable  INR 1.92   Pending data:  UC  Scheduled Meds: . metoprolol tartrate  12.5 mg Oral BID  . polyethylene glycol  17 g Oral Daily   Continuous Infusions: . dextrose 5 % and 0.45% NaCl 50 mL/hr at 01/24/15 1824  . heparin 1,350 Units/hr (01/24/15 2101)    Principal Problem:   SBO (small bowel obstruction) ([place]) Active Problems:   DVT (deep venous thrombosis) ([name])   Factor V Leiden ([place])   Chronic anticoagulation   Small bowel obstruction ([place])   By signing my name below, I, Rosalie Doctor attest that this documentation has been prepared under the direction and in the presence of Murray Hodgkins, MD Electronically signed: Rosalie Doctor, Scribe.  01/25/2015   I personally performed the services  described in this documentation. All medical record entries made by the scribe were at my direction. I have reviewed the chart and agree that the record reflects my personal performance and is accurate and complete. Murray Hodgkins, MD

## 2015-01-25 NOTE — Progress Notes (Signed)
ANTICOAGULATION CONSULT NOTE - follow up  Pharmacy Consult for Heparin Indication: h/o DVT's, factor V leiden, on chronic Coumadin  Allergies  Allergen Reactions  . Imatinib Other (See Comments)    Cardiac dysrhythmia  . Penicillins Anaphylaxis    Has patient had a PCN reaction causing immediate rash, facial/tongue/throat swelling, SOB or lightheadedness with hypotension: No no Has patient had a PCN reaction causing severe rash involving mucus membranes or skin necrosis: No Has patient had a PCN reaction that required hospitalization No Has patient had a PCN reaction occurring within the last 10 years: No If all of the above answers are "NO", then may proceed with Cephalosporin use.   . Cephalexin Swelling  . Dexlansoprazole Swelling  . Latex Itching  . Other Swelling    Patient states that Pecans cause her mouth to swell.  . Tape Itching  . Enoxaparin Sodium Itching, Swelling and Palpitations  . Nylon Rash  . Omeprazole Itching and Rash  . Sulfonamide Derivatives Rash   Patient Measurements: Height: 5\' 7"  (170.2 cm) Weight: 247 lb 9.2 oz (112.3 kg) IBW/kg (Calculated) : 61.6 HEPARIN DW (KG): 87.6  Vital Signs: Temp: 97.9 F (36.6 C) (12/30 0634) Temp Source: Oral (12/30 0634) BP: 144/72 mmHg (12/30 0634) Pulse Rate: 61 (12/30 0634)  Labs:  Recent Labs  01/23/15 0025 01/23/15 1443 01/24/15 0707 01/24/15 0708 01/25/15 0703  HGB 15.2*  --  13.7  --  13.1  HCT 46.7*  --  42.8  --  41.4  PLT 311  --  289  --  250  LABPROT 21.8*  --   --   --  20.1*  INR 1.92*  --   --   --  1.72*  HEPARINUNFRC  --  0.29*  --  0.33 0.44  CREATININE 0.76  --  0.66  --   --     Estimated Creatinine Clearance: 93.1 mL/min (by C-G formula based on Cr of 0.66).   Medical History: Past Medical History  Diagnosis Date  . Vitamin B12 deficiency     vit b12 1000 mcg monthly  . Cellulitis of left leg 2006  . Ulcer 05/2009    esophageal  . Clotting disorder ([place])    heterozygosity from factor v leiden  . Pernicious anemia 07/10/2010  . DVT (deep venous thrombosis) ([place]) 07/10/2010    on coumadin  . Factor V Leiden ([place])   . Anxiety   . 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 Allergic urticaria 01/04/2011    Rash from tape.  Marland Kitchen GERD (gastroesophageal reflux disease)   . Ventricular tachycardia ([place]) 03/26/11    Medications:  Prescriptions prior to admission  Medication Sig Dispense Refill Last Dose  . acetaminophen (TYLENOL) 500 MG tablet Take 500-1,000 mg by mouth every 6 (six) hours as needed for moderate pain.   unknown  . cyanocobalamin (,VITAMIN B-12,) 1000 MCG/ML injection Inject 1 mL (1,000 mcg total) into the muscle every 30 (thirty) days. 10 mL 6 Past Month at Unknown time  . diphenhydrAMINE (BENADRYL) 25 mg capsule Take 25 mg by mouth every 6 (six) hours as needed for allergies.   unknown  . ferrous sulfate 325 (65 FE) MG tablet Take 325 mg by mouth 3 (three) times a week.   Past Week at Unknown time  . lansoprazole (PREVACID) 15 MG capsule Take 15 mg by mouth daily.    01/22/2015 at Unknown time  . metoprolol tartrate (LOPRESSOR) 25 MG tablet Take 1 tablet (25  mg total) by mouth daily. Take 1/2 Tablet Two times Daily (Patient taking differently: Take 12.5 mg by mouth 2 (two) times daily. ) 90 tablet 3 01/22/2015 at 1730  . warfarin (COUMADIN) 5 MG tablet Take 1 tablet (5 mg total) by mouth daily. 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 No history of nausea or vomiting the patient had 4 bowel movements since yesterday and the bowel movements are not diarrheic. Patient denies abdominal trauma, no cough shortness of breath or chest pain. The patient is on Coumadin for history of factor V Le[place] and history of DVTs.   Asked to initiate Heparin while Coumadin on hold.  Per notes, no plans for surgery at this time for SBO.  Heparin level is therapeutic at 0.44.  CBC ok.  Goal of Therapy:  Heparin level 0.3-0.7 units/ml Monitor platelets by anticoagulation protocol: Yes   Plan:  Heparin infusion at 1350 units/hr Heparin level daily CBC daily while on Heparin  Trenton Gammon, Deronda Christian L 01/25/2015,8:22 AM

## 2015-01-25 NOTE — Progress Notes (Signed)
  Subjective: Patient feels well and would like to go home. No nausea or vomiting have been noted. No abdominal pain has been noted. Has been moving bowels.  Objective: Vital signs in last 24 hours: Temp:  [97.9 F (36.6 C)-98.5 F (36.9 C)] 97.9 F (36.6 C) (12/30 0634) Pulse Rate:  [61-62] 61 (12/30 0634) Resp:  [16-18] 18 (12/30 0634) BP: (137-150)/(62-72) 144/72 mmHg (12/30 0634) SpO2:  [96 %-99 %] 97 % (12/30 0634) Last BM Date: 01/23/15  Intake/Output from previous day: 12/29 0701 - 12/30 0700 In: 2123.3 [P.O.:720; I.V.:1403.3] Out: -  Intake/Output this shift:    General appearance: alert, cooperative and no distress GI: soft, non-tender; bowel sounds normal; no masses,  no organomegaly  Lab Results:   Recent Labs  01/24/15 0707 01/25/15 0703  WBC 5.7 4.4  HGB 13.7 13.1  HCT 42.8 41.4  PLT 289 250   BMET  Recent Labs  01/23/15 0025 01/24/15 0707  NA 139 141  K 4.1 3.8  CL 105 108  CO2 26 29  GLUCOSE 129* 102*  BUN 14 9  CREATININE 0.76 0.66  CALCIUM 9.7 8.9   PT/INR  Recent Labs  01/23/15 0025  LABPROT 21.8*  INR 1.92*    Studies/Results: No results found.  Anti-infectives: Anti-infectives    None      Assessment/Plan: Impression: Small bowel obstruction, resolved Plan: Have advanced to regular diet. No need for surgical intervention. Okay for discharge from surgery standpoint, though patient understands that she has to be switched off her heparin drip to Coumadin.  LOS: 2 days    Enjoli Tidd A 01/25/2015

## 2015-01-30 ENCOUNTER — Ambulatory Visit: Payer: Medicare Other | Admitting: Pediatrics

## 2015-01-31 LAB — PROTIME-INR: INR: 1.6 — AB (ref 0.9–1.1)

## 2015-02-08 ENCOUNTER — Telehealth (HOSPITAL_COMMUNITY): Payer: Self-pay | Admitting: Emergency Medicine

## 2015-02-08 LAB — PROTIME-INR

## 2015-02-08 NOTE — Telephone Encounter (Signed)
Pt taking 5 mg daily coumadin.  Change to 7.5 mg, 5mg , 5 mg then repeat, pt verbalized understanding.

## 2015-03-01 ENCOUNTER — Ambulatory Visit (INDEPENDENT_AMBULATORY_CARE_PROVIDER_SITE_OTHER): Payer: Medicare Other | Admitting: Pediatrics

## 2015-03-01 ENCOUNTER — Encounter: Payer: Self-pay | Admitting: Pediatrics

## 2015-03-01 VITALS — BP 137/80 | HR 76 | Temp 97.9°F | Ht 67.0 in | Wt 242.6 lb

## 2015-03-01 DIAGNOSIS — K5900 Constipation, unspecified: Secondary | ICD-10-CM | POA: Diagnosis not present

## 2015-03-01 DIAGNOSIS — R8281 Pyuria: Secondary | ICD-10-CM

## 2015-03-01 DIAGNOSIS — N390 Urinary tract infection, site not specified: Secondary | ICD-10-CM | POA: Diagnosis not present

## 2015-03-01 DIAGNOSIS — R399 Unspecified symptoms and signs involving the genitourinary system: Secondary | ICD-10-CM

## 2015-03-01 LAB — POCT URINALYSIS DIPSTICK
BILIRUBIN UA: NEGATIVE
GLUCOSE UA: NEGATIVE
KETONES UA: NEGATIVE
Nitrite, UA: POSITIVE
SPEC GRAV UA: 1.025
Urobilinogen, UA: NEGATIVE
pH, UA: 5

## 2015-03-01 LAB — POCT UA - MICROSCOPIC ONLY
CASTS, UR, LPF, POC: NEGATIVE
Crystals, Ur, HPF, POC: NEGATIVE
MUCUS UA: NEGATIVE
YEAST UA: NEGATIVE

## 2015-03-01 MED ORDER — NITROFURANTOIN MONOHYD MACRO 100 MG PO CAPS: 100.0000 mg | ORAL_CAPSULE | Freq: Two times a day (BID) | ORAL | Status: DC

## 2015-03-01 NOTE — Progress Notes (Signed)
Subjective:    Patient ID: [name], female    DOB: 1950-07-05, 65 y.o.   MRN: [id number]  CC: Hospitalization Follow-up   HPI: [name] is a 65 y.o. female presenting for Hospitalization Follow-up  Recent admission 6 weeks ago with SBO with possible adhesions that resolved with supportive care.  Was given miralax for constipation, to help with partial blockage in the hospital. Miralax helped at first, now with some bloating and abd discomfort every time she takes. HAsnt taken it for past 5 days with improvement in abd symptoms. Having daily or multiple times a day bowel movements, all loose, none solid. Now also sometimes taking metamucil, pro-biotics.  Wants to know what she should take daily if anything for bowels.  Factor V Leiden heterozygote: INR at home 2.2 today, has machine and she checks weekly.. Followed by hematology. On warfarin. Not interested in switching to xarelto due to expense.   Having some R sided and lower abd pain, burning and irritation with urination. No fevers. Normal appetite. Otherwise feeling well.    Depression screen [facility] 2/9 03/01/2015 11/19/2014 11/19/2014 11/19/2014 11/22/2013  Decreased Interest 0 2 2 0 0  Down, Depressed, Hopeless 0 2 2 0 0  PHQ - 2 Score 0 4 4 0 0  Altered sleeping - 3 - - -  Tired, decreased energy - 2 - - -  Change in appetite - 2 - - -  Feeling bad or failure about yourself  - 1 - - -  Trouble concentrating - 0 - - -  Moving slowly or fidgety/restless - 0 - - -  Suicidal thoughts - 0 - - -  PHQ-9 Score - 12 - - -     Relevant past medical, surgical, family and social history reviewed and updated as indicated. Interim medical history since our last visit reviewed. Allergies and medications reviewed and updated.    ROS: Per HPI unless specifically indicated above  History  Smoking status  . Never Smoker   Smokeless tobacco  . Never Used    Past Medical History Patient Active Problem List   Diagnosis Date Noted  . Small bowel obstruction ([place]) 01/23/2015  . SBO (small bowel obstruction) ([place]) 01/23/2015  . Depression 11/19/2014  . Colonic cancer ([place]) 09/21/2012  . Abnormal CT scan, colon 09/21/2012  . Bradycardia 04/19/2011  . Hypotension, iatrogenic 03/26/2011  . Ventricular tachycardia ([place]) 03/26/2011  . SVT (supraventricular tachycardia) ([place]) 03/25/2011  . Allergic urticaria 01/04/2011  . Lip swelling 01/03/2011  . GIST (gastrointestinal stromal tumor), malignant ([place]) 01/03/2011  . Syncope 01/01/2011  . Acute blood loss anemia 01/01/2011  . Chronic ulcer of left leg ([place]) 01/01/2011  . Chronic anticoagulation 01/01/2011  . Abnormal CXR 01/01/2011  . Factor V Leiden ([place])   . DVT (deep venous thrombosis) ([place]) 07/10/2010  . Pernicious anemia 07/10/2010  . CONTUSION, ARM 02/25/2010  . Iron deficiency anemia 08/27/2009  . WEIGHT GAIN 08/27/2009  . Reflux esophagitis 07/05/2009  . GI BLEEDING 06/11/2009        Objective:    BP 137/80 mmHg  Pulse 76  Temp(Src) 97.9 F (36.6 C) (Oral)  Ht 5\' 7"  (1.702 m)  Wt 242 lb 9.6 oz (110.043 kg)  BMI 37.99 kg/m2  Wt Readings from Last 3 Encounters:  03/01/15 242 lb 9.6 oz (110.043 kg)  01/23/15 247 lb 9.2 oz (112.3 kg)  01/14/15 244 lb (110.678 kg)     Gen: NAD, alert, cooperative with exam, NCAT EYES:  EOMI, no scleral injection or icterus ENT: OP without erythema LYMPH: no cervical LAD CV: NRRR, normal S1/S2, no murmur, distal pulses 2+ b/l Resp: CTABL, no wheezes, normal WOB Abd: +BS, soft, mildly tender suprapubicly, ND. no guarding or organomegaly, no CVA tenderness Ext: No edema, warm Neuro: Alert and oriented, strength equal b/l UE and LE, coordination grossly normal MSK: normal muscle bulk     Assessment & Plan:    [name] was seen today for hospitalization follow-up, constipation, and UTI symptoms, Macdonald ot have pyuria.  Diagnoses and all orders for this visit:  UTI symptoms -     POCT  urinalysis dipstick -     POCT UA - Microscopic Only -     Urine culture  Constipation, unspecified constipation type Take miralax only if needed. Increase fiber in diet with metamucil or increased fruits and veg in diet. Can take probiotics daily. Goal daily bowel movements, take miralax if no stool in 24h. If stools are starting to get hard, start back on miralax.  Pyuria -     nitrofurantoin, macrocrystal-monohydrate, (MACROBID) 100 MG capsule; Take 1 capsule (100 mg total) by mouth 2 (two) times daily. 1 po BId   Follow up plan: As needed  Denise Found, [name] [facility] 03/01/2015, 6:14 PM

## 2015-03-01 NOTE — Patient Instructions (Addendum)
Take miralax only if not having bowel movements for 24 hours  Take one pill pro-biotic daily  Metamucil--take as needed

## 2015-03-02 ENCOUNTER — Encounter (HOSPITAL_COMMUNITY): Payer: Self-pay | Admitting: Hematology & Oncology

## 2015-03-03 LAB — URINE CULTURE

## 2015-04-12 LAB — POCT INR: INR: 4.5

## 2015-04-15 ENCOUNTER — Encounter (HOSPITAL_COMMUNITY): Payer: Self-pay | Admitting: *Deleted

## 2015-04-15 LAB — POCT INR: INR: 1.7

## 2015-04-15 NOTE — Progress Notes (Signed)
Patient notified of dose change and INR weekly

## 2015-04-22 LAB — PROTIME-INR

## 2015-04-25 ENCOUNTER — Telehealth: Payer: Self-pay | Admitting: Orthopedic Surgery

## 2015-04-25 NOTE — Telephone Encounter (Signed)
Patient called to request appointment for knee problem for which she was seen at [facility] Emergency room 08/08/14.  Relays has been seen for the problem by orthopedist in [place]; therefore, second opinion.  Patient will request notes for our doctors to review; appointment pending.

## 2015-05-13 ENCOUNTER — Other Ambulatory Visit: Payer: Self-pay | Admitting: Internal Medicine

## 2015-06-26 LAB — PROTIME-INR: INR: 3 — AB (ref 0.9–1.1)

## 2015-07-02 ENCOUNTER — Other Ambulatory Visit (HOSPITAL_COMMUNITY): Payer: Self-pay | Admitting: Oncology

## 2015-07-02 DIAGNOSIS — D6851 Activated protein C resistance: Secondary | ICD-10-CM

## 2015-07-10 ENCOUNTER — Other Ambulatory Visit (HOSPITAL_COMMUNITY): Payer: Medicare Other

## 2015-07-16 ENCOUNTER — Encounter (HOSPITAL_COMMUNITY): Payer: Medicare Other | Attending: Oncology | Admitting: Oncology

## 2015-07-16 ENCOUNTER — Encounter (HOSPITAL_COMMUNITY): Payer: Medicare Other

## 2015-07-16 ENCOUNTER — Encounter (HOSPITAL_COMMUNITY): Payer: Self-pay | Admitting: Oncology

## 2015-07-16 VITALS — BP 146/76 | HR 76 | Temp 97.6°F | Resp 18 | Wt 243.0 lb

## 2015-07-16 DIAGNOSIS — D6851 Activated protein C resistance: Secondary | ICD-10-CM | POA: Insufficient documentation

## 2015-07-16 DIAGNOSIS — M25562 Pain in left knee: Secondary | ICD-10-CM | POA: Diagnosis not present

## 2015-07-16 DIAGNOSIS — D509 Iron deficiency anemia, unspecified: Secondary | ICD-10-CM | POA: Insufficient documentation

## 2015-07-16 DIAGNOSIS — D510 Vitamin B12 deficiency anemia due to intrinsic factor deficiency: Secondary | ICD-10-CM | POA: Insufficient documentation

## 2015-07-16 DIAGNOSIS — Z9889 Other specified postprocedural states: Secondary | ICD-10-CM | POA: Diagnosis not present

## 2015-07-16 DIAGNOSIS — C49A3 Gastrointestinal stromal tumor of small intestine: Secondary | ICD-10-CM | POA: Diagnosis present

## 2015-07-16 DIAGNOSIS — Z7901 Long term (current) use of anticoagulants: Secondary | ICD-10-CM | POA: Diagnosis not present

## 2015-07-16 DIAGNOSIS — F419 Anxiety disorder, unspecified: Secondary | ICD-10-CM | POA: Diagnosis not present

## 2015-07-16 DIAGNOSIS — K219 Gastro-esophageal reflux disease without esophagitis: Secondary | ICD-10-CM | POA: Insufficient documentation

## 2015-07-16 LAB — PROTIME-INR
INR: 2.09 — ABNORMAL HIGH (ref 0.00–1.49)
PROTHROMBIN TIME: 23.3 s — AB (ref 11.6–15.2)

## 2015-07-16 NOTE — Assessment & Plan Note (Addendum)
Factor V Leiden, on lifelong anticoagulation with Vitamin K antagonist.  She was monitoring her INRs at home with a home INR device that was contracted with a company, but she found that she was getting charged twice the amount that she should.  As a result, she discontinued this monitoring service and wishes to have [place] follow her INRs.  Labs today: PT/INR.  I personally reviewed and went over laboratory results with the patient.  The results are noted within this dictation.  INR is 2.09.  She will continue the same dose of Coumadin.  I personally reviewed and went over radiographic studies with the patient.  The results are noted within this dictation.  She is due for mammogram and she will get this scheduled later this month.  Labs in 4 weeks: CBC, ferritin, PT/INR.  She is interested in changing to Xarelto, but will wait until next year when she enrolls for prescription coverage with her insurance.  At the time of transition, if her INR is therapeutic, she will be switched to Xarelto 20 mg PO daily.  We discussed the risks, benefits, alternatives, and side effects of Xarelto.  She is following with an orthopod at [facility] orthopedic for her left knee.  She reports a discussion of total knee arthroplasty which she is resistant to as a result of a degenerative joint.  Return in 4 weeks for follow-up.

## 2015-07-16 NOTE — Assessment & Plan Note (Signed)
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   Annual CT imaging has been negative for recurrence.  CT abd/pelvis with contrast is due in December 2017.

## 2015-07-16 NOTE — Patient Instructions (Addendum)
[facility] Discharge Instructions  RECOMMENDATIONS MADE BY THE CONSULTANT AND ANY TEST RESULTS WILL BE SENT TO YOUR REFERRING PHYSICIAN.  Exam done and seen today by Kirby Crigler Labs reviewed today.  INR is 2.09.  Repeat INR in 4 weeks.  When your insurance changes and you want to switch to Xarelto, let us know. Return to see the Doctor in 6 months Call the clinic for any concerns or questions.  Thank you for choosing [place] at [facility] to provide your oncology and hematology care.  To afford each patient quality time with our provider, please arrive at least 15 minutes before your scheduled appointment time.   Beginning January 23rd 2017 lab work for the [facility] will be done in the  Main lab at [facility] on 1st floor. If you have a lab appointment with the [facility] please come in thru the  Main Entrance and check in at the main information desk  You need to re-schedule your appointment should you arrive 10 or more minutes late.  We strive to give you quality time with our providers, and arriving late affects you and other patients whose appointments are after yours.  Also, if you no show three or more times for appointments you may be dismissed from the clinic at the providers discretion.     Again, thank you for choosing [facility].  Our hope is that these requests will decrease the amount of time that you wait before being seen by our physicians.       _____________________________________________________________  Should you have questions after your visit to [facility], please contact our office at [phone] between the hours of 8:30 a.m. and 4:30 p.m.  Voicemails left after 4:30 p.m. will not be returned until the following business day.  For prescription refill requests, have your pharmacy contact our office.         Resources For Cancer Patients and their  Caregivers ? American Cancer Society: Can assist with transportation, wigs, general needs, runs Look Good Feel Better.        (757) 765-4012 ? Cancer Care: Provides financial assistance, online support groups, medication/co-pay assistance.  1-800-813-HOPE 212-532-1628) ? [place] Assists [place] cancer patients and their families through emotional , educational and financial support.  3474883599 ? [place] DSS Where to apply for food stamps, Medicaid and utility assistance. (978)719-4564 ? [facility]: Transportation to medical appointments. 418-604-1111 ? Social Security Administration: May apply for disability if have a Stage IV cancer. (929)109-1322 445-696-7477 ? [employer], Disability and Transit Services: Assists with nutrition, care and transit needs. [place] Support Programs: @10RELATIVEDAYS @ > Cancer Support Group  2nd Tuesday of the month 1pm-2pm, Journey Room  > Creative Journey  3rd Tuesday of the month 1130am-1pm, Journey Room  > Look Good Feel Better  1st Wednesday of the month 10am-12 noon, Journey Room (Call [place] to register [phone])

## 2015-07-16 NOTE — Progress Notes (Signed)
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  Annual imaging surveillance as well.  INTERVAL HISTORY: [name] 65 y.o. female returns for followup of Heterozygote for Factor V Leiden mutation, on anticoagulation. AND GIST, Stage II.  S/P resection on 01/05/2011 by Dr. Arnoldo Morale.    GIST (gastrointestinal stromal tumor), malignant ([place])   01/03/2011 Initial Diagnosis GIST (gastrointestinal stromal tumor), malignant ([place])   She is doing very well. She denies any acute complaints. She was previously following her INR levels at home with an INR device that was contracted with a company. Unfortunately, she was double charged and therefore she has discontinued their service. She admits to compliance with her Coumadin.  She is seeing an orthopod in [place] for her left knee pain. This is been ongoing issue and she notes that it was recommended she consider a total left knee arthroplasty. She is hesitant to pursue this. She reports that she has follow-up upcoming.  She denies any signs of blood loss.   Review of Systems  Constitutional: Negative for fever, chills and weight loss.  HENT: Negative.  Negative for nosebleeds.   Eyes: Negative.   Respiratory: Negative.  Negative for hemoptysis.   Cardiovascular: Negative.   Gastrointestinal: Negative for nausea, vomiting, diarrhea, constipation, blood in stool and melena.  Genitourinary: Negative.   Musculoskeletal: Negative.   Skin: Negative.   Neurological: Negative.   Endo/Heme/Allergies:  Negative.   Psychiatric/Behavioral: Negative.     Past Medical History  Diagnosis Date  . Vitamin B12 deficiency     vit b12 1000 mcg monthly  . Cellulitis of left leg 2006  . Ulcer 05/2009    esophageal  . Clotting disorder ([place])     heterozygosity from factor v leiden  . Pernicious anemia 07/10/2010  . DVT (deep venous thrombosis) ([place]) 07/10/2010    on coumadin  . Factor V Leiden ([place])   . Anxiety   . 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 Allergic urticaria 01/04/2011    Rash from tape.  Marland Kitchen GERD (gastroesophageal reflux disease)   . Ventricular tachycardia ([place]) 03/26/11  . GIST (gastrointestinal stromal tumor), malignant ([place]) 01/03/2011    S/P resection on 01/05/11.  Intolerant to [employer].     Past Surgical History  Procedure Laterality Date  . Abdominal hysterectomy  1989  . Balloon dilation  12/11/2010    Procedure: BALLOON DILATION;  Surgeon: Rogene Houston, MD;  [facility];  Service: Endoscopy;  Laterality: N/A;  . Laparotomy  01/05/2011    Procedure: EXPLORATORY LAPAROTOMY;  Surgeon: Jamesetta So;  Location: [facility] OR[facility];  Service: General;  Laterality: N/A;  . Bowel resection  01/05/2011    Procedure: SMALL BOWEL RESECTION;  Surgeon: Jamesetta So;  Location: [facility];  Service: General;;  Partial Small Bowel Resection  . Givens capsule study  01/02/2011    Procedure: GIVENS CAPSULE STUDY;  Surgeon: Rogene Houston, MD;  Location: [facility];  Service: Endoscopy;  Laterality: N/A;  . Colonoscopy  02/25/2012  Procedure: COLONOSCOPY;  Surgeon: Rogene Houston, MD;  Location: [facility];  Service: Endoscopy;  Laterality: N/A;  1200    History reviewed. No pertinent family history.  Social History   Social History  . Marital Status: Married    Spouse Name: N/A  . Number of Children: N/A  . Years of Education: N/A   Social History Main Topics  . Smoking status: Never Smoker   . Smokeless tobacco: Never Used  . Alcohol Use: No  . Drug Use:  No  . Sexual Activity: Not Currently   Other Topics Concern  . None   Social History Narrative     PHYSICAL EXAMINATION  ECOG PERFORMANCE STATUS: 1 - Symptomatic but completely ambulatory  Filed Vitals:   07/16/15 1429  BP: 146/76  Pulse: 76  Temp: 97.6 F (36.4 C)  Resp: 18    GENERAL:alert, no distress, well nourished, well developed, comfortable, cooperative, obese, smiling and unaccompanied SKIN: skin color, texture, turgor are normal, no rashes or significant lesions HEAD: Normocephalic, No masses, lesions, tenderness or abnormalities EYES: normal, PERRLA, Conjunctiva are pink and non-injected EARS: External ears normal OROPHARYNX:lips, buccal mucosa, and tongue normal and mucous membranes are moist  NECK: supple, no adenopathy, thyroid normal size, non-tender, without nodularity, trachea midline LYMPH:  not examined BREAST:not examined LUNGS: clear to auscultation  HEART: regular rate & rhythm, no murmurs, no gallops, S1 normal and S2 normal ABDOMEN:abdomen soft, non-tender, obese and normal bowel sounds BACK: Back symmetric, no curvature. EXTREMITIES:less then 2 second capillary refill, no joint deformities, effusion, or inflammation, no skin discoloration, no cyanosis  NEURO: alert & oriented x 3 with fluent speech, no focal motor/sensory deficits, gait normal   LABORATORY DATA: CBC    Component Value Date/Time   WBC 4.4 01/25/2015 0703   RBC 4.57 01/25/2015 0703   RBC 3.81* 06/07/2009 1230   HGB 13.1 01/25/2015 0703   HCT 41.4 01/25/2015 0703   PLT 250 01/25/2015 0703   MCV 90.6 01/25/2015 0703   MCH 28.7 01/25/2015 0703   MCHC 31.6 01/25/2015 0703   RDW 13.3 01/25/2015 0703   LYMPHSABS 1.9 01/23/2015 0025   MONOABS 1.0 01/23/2015 0025   EOSABS 0.1 01/23/2015 0025   BASOSABS 0.0 01/23/2015 0025      Chemistry      Component Value Date/Time   NA 141 01/24/2015 0707   K 3.8 01/24/2015 0707   CL 108 01/24/2015 0707   CO2 29 01/24/2015 0707   BUN  9 01/24/2015 0707   CREATININE 0.66 01/24/2015 0707      Component Value Date/Time   CALCIUM 8.9 01/24/2015 0707   ALKPHOS 77 01/23/2015 0025   AST 26 01/23/2015 0025   ALT 31 01/23/2015 0025   BILITOT 0.8 01/23/2015 0025     Lab Results  Component Value Date   INR 2.09* 07/16/2015   INR 3.0* 06/26/2015   INR 1.7 04/15/2015   Lab Results  Component Value Date   IRON 83 01/16/2014   TIBC 275 01/16/2014   FERRITIN 49 01/14/2015     PENDING LABS:   RADIOGRAPHIC STUDIES:  No results found.   PATHOLOGY:    ASSESSMENT AND PLAN:  Factor V Leiden ([place]) Factor V Leiden, on lifelong anticoagulation with Vitamin K antagonist.  She was monitoring her INRs at home with a home INR device that was contracted with a company, but she found that she was getting charged twice the amount that she should.  As a result, she discontinued this  monitoring service and wishes to have [place] follow her INRs.  Labs today: PT/INR.  I personally reviewed and went over laboratory results with the patient.  The results are noted within this dictation.  INR is 2.09.  She will continue the same dose of Coumadin.  I personally reviewed and went over radiographic studies with the patient.  The results are noted within this dictation.  She is due for mammogram and she will get this scheduled later this month.  Labs in 4 weeks: CBC, ferritin, PT/INR.  She is interested in changing to Xarelto, but will wait until next year when she enrolls for prescription coverage with her insurance.  At the time of transition, if her INR is therapeutic, she will be switched to Xarelto 20 mg PO daily.  We discussed the risks, benefits, alternatives, and side effects of Xarelto.  She is following with an orthopod at [facility] orthopedic for her left knee.  She reports a discussion of total knee arthroplasty which she is resistant to as a result of a degenerative joint.  Return in 4 weeks for follow-up.    GIST  (gastrointestinal stromal tumor), malignant ([place]) Stage II GIST status post resection on 01/05/2011,   She was started on adjuvant imatinib that was complicated by cardiac dysrhythmia adverse effects.   Annual CT imaging has been negative for recurrence.  CT abd/pelvis with contrast is due in December 2017.    ORDERS PLACED FOR THIS ENCOUNTER: Orders Placed This Encounter  Procedures  . CT Abdomen Pelvis W Contrast  . CBC with Differential  . Protime-INR  . Ferritin  . Iron and TIBC  . Protime-INR    MEDICATIONS PRESCRIBED THIS ENCOUNTER: No orders of the defined types were placed in this encounter.    THERAPY PLAN:  Continue anticoagulation as directed and we will monitor for recurrence of GIST with annual imaging.  All questions were answered. The patient knows to call the clinic with any problems, questions or concerns. We can certainly see the patient much sooner if necessary.  Patient and plan discussed with Dr. Ancil Linsey and she is in agreement with the aforementioned.   This note is electronically signed by: Doy Mince 07/16/2015 3:54 PM

## 2015-07-18 ENCOUNTER — Ambulatory Visit (HOSPITAL_COMMUNITY): Payer: Medicare Other | Admitting: Oncology

## 2015-07-18 ENCOUNTER — Other Ambulatory Visit (HOSPITAL_COMMUNITY): Payer: Medicare Other

## 2015-07-19 ENCOUNTER — Telehealth: Payer: Self-pay | Admitting: *Deleted

## 2015-08-19 ENCOUNTER — Encounter (HOSPITAL_COMMUNITY): Payer: Medicare Other | Attending: Oncology

## 2015-08-19 DIAGNOSIS — D509 Iron deficiency anemia, unspecified: Secondary | ICD-10-CM | POA: Insufficient documentation

## 2015-08-19 DIAGNOSIS — Z9889 Other specified postprocedural states: Secondary | ICD-10-CM | POA: Insufficient documentation

## 2015-08-19 DIAGNOSIS — D510 Vitamin B12 deficiency anemia due to intrinsic factor deficiency: Secondary | ICD-10-CM | POA: Insufficient documentation

## 2015-08-19 DIAGNOSIS — F419 Anxiety disorder, unspecified: Secondary | ICD-10-CM | POA: Insufficient documentation

## 2015-08-19 DIAGNOSIS — D6851 Activated protein C resistance: Secondary | ICD-10-CM | POA: Insufficient documentation

## 2015-08-19 DIAGNOSIS — K219 Gastro-esophageal reflux disease without esophagitis: Secondary | ICD-10-CM | POA: Diagnosis not present

## 2015-08-19 DIAGNOSIS — C49A3 Gastrointestinal stromal tumor of small intestine: Secondary | ICD-10-CM | POA: Insufficient documentation

## 2015-08-19 DIAGNOSIS — M25562 Pain in left knee: Secondary | ICD-10-CM | POA: Insufficient documentation

## 2015-08-19 DIAGNOSIS — Z7901 Long term (current) use of anticoagulants: Secondary | ICD-10-CM | POA: Diagnosis not present

## 2015-08-19 LAB — CBC WITH DIFFERENTIAL/PLATELET
BASOS PCT: 0 %
Basophils Absolute: 0 10*3/uL (ref 0.0–0.1)
EOS ABS: 0.2 10*3/uL (ref 0.0–0.7)
Eosinophils Relative: 3 %
HCT: 42.2 % (ref 36.0–46.0)
HEMOGLOBIN: 13.8 g/dL (ref 12.0–15.0)
LYMPHS ABS: 2.1 10*3/uL (ref 0.7–4.0)
Lymphocytes Relative: 38 %
MCH: 28.8 pg (ref 26.0–34.0)
MCHC: 32.7 g/dL (ref 30.0–36.0)
MCV: 88.1 fL (ref 78.0–100.0)
Monocytes Absolute: 0.7 10*3/uL (ref 0.1–1.0)
Monocytes Relative: 14 %
NEUTROS PCT: 45 %
Neutro Abs: 2.4 10*3/uL (ref 1.7–7.7)
Platelets: 254 10*3/uL (ref 150–400)
RBC: 4.79 MIL/uL (ref 3.87–5.11)
RDW: 13.6 % (ref 11.5–15.5)
WBC: 5.4 10*3/uL (ref 4.0–10.5)

## 2015-08-19 LAB — PROTIME-INR
INR: 2.36 — AB (ref 0.00–1.49)
Prothrombin Time: 25.6 seconds — ABNORMAL HIGH (ref 11.6–15.2)

## 2015-08-19 LAB — FERRITIN: FERRITIN: 40 ng/mL (ref 11–307)

## 2015-09-02 ENCOUNTER — Other Ambulatory Visit (HOSPITAL_COMMUNITY): Payer: Self-pay | Admitting: Hematology & Oncology

## 2015-09-02 DIAGNOSIS — Z1231 Encounter for screening mammogram for malignant neoplasm of breast: Secondary | ICD-10-CM

## 2015-09-06 ENCOUNTER — Other Ambulatory Visit (HOSPITAL_COMMUNITY): Payer: Self-pay | Admitting: *Deleted

## 2015-09-06 DIAGNOSIS — D6851 Activated protein C resistance: Secondary | ICD-10-CM

## 2015-09-06 MED ORDER — WARFARIN SODIUM 5 MG PO TABS: 5.0000 mg | ORAL_TABLET | Freq: Every day | ORAL | 6 refills | Status: DC

## 2015-09-12 NOTE — Telephone Encounter (Signed)
error 

## 2015-09-16 ENCOUNTER — Ambulatory Visit (HOSPITAL_COMMUNITY)
Admission: RE | Admit: 2015-09-16 | Discharge: 2015-09-16 | Disposition: A | Discharge status: Home / Self Care | Payer: Medicare Other | Source: Ambulatory Visit | Attending: Hematology & Oncology | Admitting: Hematology & Oncology

## 2015-09-16 ENCOUNTER — Encounter (HOSPITAL_COMMUNITY): Payer: Medicare Other | Attending: Oncology

## 2015-09-16 DIAGNOSIS — M25562 Pain in left knee: Secondary | ICD-10-CM | POA: Insufficient documentation

## 2015-09-16 DIAGNOSIS — D510 Vitamin B12 deficiency anemia due to intrinsic factor deficiency: Secondary | ICD-10-CM | POA: Diagnosis not present

## 2015-09-16 DIAGNOSIS — C49A3 Gastrointestinal stromal tumor of small intestine: Secondary | ICD-10-CM | POA: Diagnosis not present

## 2015-09-16 DIAGNOSIS — Z7901 Long term (current) use of anticoagulants: Secondary | ICD-10-CM | POA: Diagnosis not present

## 2015-09-16 DIAGNOSIS — D509 Iron deficiency anemia, unspecified: Secondary | ICD-10-CM | POA: Insufficient documentation

## 2015-09-16 DIAGNOSIS — Z1231 Encounter for screening mammogram for malignant neoplasm of breast: Secondary | ICD-10-CM | POA: Insufficient documentation

## 2015-09-16 DIAGNOSIS — F419 Anxiety disorder, unspecified: Secondary | ICD-10-CM | POA: Diagnosis not present

## 2015-09-16 DIAGNOSIS — K219 Gastro-esophageal reflux disease without esophagitis: Secondary | ICD-10-CM | POA: Insufficient documentation

## 2015-09-16 DIAGNOSIS — D6851 Activated protein C resistance: Secondary | ICD-10-CM

## 2015-09-16 DIAGNOSIS — Z9889 Other specified postprocedural states: Secondary | ICD-10-CM | POA: Insufficient documentation

## 2015-09-16 LAB — PROTIME-INR
INR: 2.46
Prothrombin Time: 27.1 seconds — ABNORMAL HIGH (ref 11.4–15.2)

## 2015-10-01 ENCOUNTER — Telehealth: Payer: Self-pay | Admitting: *Deleted

## 2015-10-01 ENCOUNTER — Other Ambulatory Visit (HOSPITAL_COMMUNITY): Payer: Self-pay | Admitting: Oncology

## 2015-10-01 DIAGNOSIS — D510 Vitamin B12 deficiency anemia due to intrinsic factor deficiency: Secondary | ICD-10-CM

## 2015-10-01 MED ORDER — CYANOCOBALAMIN 1000 MCG/ML IJ SOLN: 1000.0000 ug | INTRAMUSCULAR | 11 refills | Status: DC

## 2015-10-01 NOTE — Telephone Encounter (Signed)
Appt rescheduled to 9/8 with Dr. Evette Doffing

## 2015-10-02 ENCOUNTER — Ambulatory Visit: Payer: Medicare Other | Admitting: Pediatrics

## 2015-10-04 ENCOUNTER — Encounter: Payer: Self-pay | Admitting: Pediatrics

## 2015-10-04 ENCOUNTER — Ambulatory Visit (INDEPENDENT_AMBULATORY_CARE_PROVIDER_SITE_OTHER): Payer: Medicare Other | Admitting: Pediatrics

## 2015-10-04 VITALS — BP 130/81 | HR 59 | Temp 98.0°F | Ht 67.0 in | Wt 247.2 lb

## 2015-10-04 DIAGNOSIS — D6851 Activated protein C resistance: Secondary | ICD-10-CM | POA: Diagnosis not present

## 2015-10-04 DIAGNOSIS — I471 Supraventricular tachycardia: Secondary | ICD-10-CM | POA: Diagnosis not present

## 2015-10-04 DIAGNOSIS — D649 Anemia, unspecified: Secondary | ICD-10-CM

## 2015-10-04 MED ORDER — METOPROLOL TARTRATE 25 MG PO TABS: 12.5000 mg | ORAL_TABLET | Freq: Two times a day (BID) | ORAL | 2 refills | Status: DC

## 2015-10-04 MED ORDER — FERROUS SULFATE 325 (65 FE) MG PO TABS: 325.0000 mg | ORAL_TABLET | ORAL | 2 refills | Status: DC

## 2015-10-04 NOTE — Progress Notes (Signed)
  Subjective:   Patient ID: [name], female    DOB: November 21, 1950, 65 y.o.   MRN: [id number] CC: Medication Refill  HPI: [name] is a 65 y.o. female presenting for Medication Refill  Occasionally gets lightheaded at home, such as after getting out of the bath After mowing the lawn on a hot day She sits down and the feeling goes away after a couple of seconds Rarely happens  Usually feeling well No SOB, no CP Denies regular headaches No racing heart rate No palpitations with lightheaded feelings  Is on warfarin xarelto was going to be over $200 with her insurance a month and she cant afford that Has been on warfarin for years and is most comfortable staying with it  Regular stooling Under oncology care still for recent GIST tumor  Relevant past medical, surgical, family and social history reviewed. Allergies and medications reviewed and updated. History  Smoking Status  . Never Smoker  Smokeless Tobacco  . Never Used   ROS: Per HPI   Objective:    BP 130/81   Pulse (!) 59   Temp 98 F (36.7 C) (Oral)   Ht 5\' 7"  (1.702 m)   Wt 247 lb 3.2 oz (112.1 kg)   BMI 38.72 kg/m   Wt Readings from Last 3 Encounters:  10/04/15 247 lb 3.2 oz (112.1 kg)  07/16/15 243 lb (110.2 kg)  03/01/15 242 lb 9.6 oz (110 kg)    Gen: NAD, alert, cooperative with exam, NCAT EYES: EOMI, no conjunctival injection, or no icterus ENT:  TMs pearly gray b/l, OP without erythema LYMPH: no cervical LAD CV: NRRR, normal S1/S2,  distal pulses 2+ b/l Resp: CTABL, no wheezes, normal WOB Abd: +BS, soft, NTND. no guarding or organomegaly Ext: No edema, warm Neuro: Alert and oriented  Assessment & Plan:  Shiquita was seen today for medication refill and headache.  Diagnoses and all orders for this visit:  SVT (supraventricular tachycardia) ([place]) Will let me know if light-headed episodes get more frequent So far each has happened at times of likely vasodilation like after a bath or  workin goutside in the sun No palpitations with episodes Cont BB for now -     metoprolol tartrate (LOPRESSOR) 25 MG tablet; Take 0.5 tablets (12.5 mg total) by mouth 2 (two) times daily.  Factor V Leiden ([place]) Cont warfarin Not interested in switch to xarelto or similar due to expense  Anemia, unspecified anemia type Improving, followed by heme Still taking iron several times a week -     ferrous sulfate 325 (65 FE) MG tablet; Take 1 tablet (325 mg total) by mouth 3 (three) times a week.   Follow up plan: Return in about 3 months (around 01/03/2016). Assunta Found, MD [place]

## 2015-10-14 ENCOUNTER — Encounter (HOSPITAL_COMMUNITY): Payer: Medicare Other | Attending: Hematology & Oncology

## 2015-10-14 DIAGNOSIS — D6851 Activated protein C resistance: Secondary | ICD-10-CM

## 2015-10-18 ENCOUNTER — Telehealth (HOSPITAL_COMMUNITY): Payer: Self-pay | Admitting: *Deleted

## 2015-10-31 ENCOUNTER — Other Ambulatory Visit (HOSPITAL_COMMUNITY): Payer: Medicare Other

## 2015-11-04 ENCOUNTER — Encounter (HOSPITAL_COMMUNITY): Payer: Medicare Other | Attending: Oncology

## 2015-11-04 ENCOUNTER — Other Ambulatory Visit (HOSPITAL_COMMUNITY): Payer: Self-pay | Admitting: Oncology

## 2015-11-04 DIAGNOSIS — D6851 Activated protein C resistance: Secondary | ICD-10-CM | POA: Insufficient documentation

## 2015-11-04 LAB — PROTIME-INR
INR: 1.81
PROTHROMBIN TIME: 21.2 s — AB (ref 11.4–15.2)

## 2015-11-12 ENCOUNTER — Encounter (HOSPITAL_COMMUNITY): Payer: Medicare Other

## 2015-11-12 DIAGNOSIS — D6851 Activated protein C resistance: Secondary | ICD-10-CM

## 2015-11-12 LAB — PROTIME-INR
INR: 1.92
PROTHROMBIN TIME: 22.3 s — AB (ref 11.4–15.2)

## 2015-11-19 ENCOUNTER — Other Ambulatory Visit (HOSPITAL_COMMUNITY): Payer: Medicare Other

## 2015-11-20 ENCOUNTER — Encounter (HOSPITAL_COMMUNITY): Payer: Medicare Other

## 2015-11-20 DIAGNOSIS — D6851 Activated protein C resistance: Secondary | ICD-10-CM | POA: Diagnosis not present

## 2015-11-20 LAB — PROTIME-INR
INR: 2.28
Prothrombin Time: 25.5 seconds — ABNORMAL HIGH (ref 11.4–15.2)

## 2015-11-21 ENCOUNTER — Telehealth (HOSPITAL_COMMUNITY): Payer: Self-pay

## 2015-11-21 NOTE — Telephone Encounter (Signed)
Returned patient phone call and informed her to stay on same dose and INR check in 3 weeks

## 2015-11-21 NOTE — Telephone Encounter (Signed)
-----   Message from Louis Meckel sent at 11/21/2015 11:50 AM EDT ----- Regarding: pt results  Patient called and stated she has not been called about her pt results from 10/25.  Please call

## 2015-12-11 ENCOUNTER — Encounter (HOSPITAL_COMMUNITY): Payer: Medicare Other | Attending: Hematology & Oncology

## 2015-12-11 ENCOUNTER — Other Ambulatory Visit (HOSPITAL_COMMUNITY): Payer: Self-pay | Admitting: Oncology

## 2015-12-11 DIAGNOSIS — D6851 Activated protein C resistance: Secondary | ICD-10-CM

## 2015-12-11 LAB — PROTIME-INR
INR: 1.97
Prothrombin Time: 22.7 seconds — ABNORMAL HIGH (ref 11.4–15.2)

## 2015-12-11 MED ORDER — WARFARIN SODIUM 1 MG PO TABS
1.0000 mg | ORAL_TABLET | Freq: Every day | ORAL | 5 refills | Status: DC
Start: 2015-12-11 — End: 2016-08-25

## 2016-01-14 ENCOUNTER — Other Ambulatory Visit (HOSPITAL_COMMUNITY): Payer: Self-pay

## 2016-01-14 DIAGNOSIS — D6851 Activated protein C resistance: Secondary | ICD-10-CM

## 2016-01-15 ENCOUNTER — Encounter (HOSPITAL_COMMUNITY): Payer: Self-pay | Admitting: Emergency Medicine

## 2016-01-15 ENCOUNTER — Encounter (HOSPITAL_COMMUNITY): Payer: Medicare Other

## 2016-01-15 ENCOUNTER — Encounter (HOSPITAL_COMMUNITY): Payer: Self-pay | Admitting: Oncology

## 2016-01-15 ENCOUNTER — Emergency Department (HOSPITAL_COMMUNITY): Payer: Medicare Other

## 2016-01-15 ENCOUNTER — Encounter (HOSPITAL_COMMUNITY): Payer: Medicare Other | Attending: Oncology | Admitting: Oncology

## 2016-01-15 ENCOUNTER — Emergency Department (HOSPITAL_COMMUNITY)
Admission: EM | Admit: 2016-01-15 | Discharge: 2016-01-15 | Disposition: A | Discharge status: Home / Self Care | Payer: Medicare Other | Attending: Emergency Medicine | Admitting: Emergency Medicine

## 2016-01-15 VITALS — BP 141/70 | HR 62 | Temp 97.6°F | Resp 18 | Wt 248.0 lb

## 2016-01-15 DIAGNOSIS — Z7901 Long term (current) use of anticoagulants: Secondary | ICD-10-CM | POA: Insufficient documentation

## 2016-01-15 DIAGNOSIS — S52591A Other fractures of lower end of right radius, initial encounter for closed fracture: Secondary | ICD-10-CM | POA: Insufficient documentation

## 2016-01-15 DIAGNOSIS — D6851 Activated protein C resistance: Secondary | ICD-10-CM | POA: Diagnosis not present

## 2016-01-15 DIAGNOSIS — Z79899 Other long term (current) drug therapy: Secondary | ICD-10-CM | POA: Insufficient documentation

## 2016-01-15 DIAGNOSIS — W010XXA Fall on same level from slipping, tripping and stumbling without subsequent striking against object, initial encounter: Secondary | ICD-10-CM | POA: Insufficient documentation

## 2016-01-15 DIAGNOSIS — Y999 Unspecified external cause status: Secondary | ICD-10-CM | POA: Diagnosis not present

## 2016-01-15 DIAGNOSIS — C49A3 Gastrointestinal stromal tumor of small intestine: Secondary | ICD-10-CM | POA: Diagnosis not present

## 2016-01-15 DIAGNOSIS — Y939 Activity, unspecified: Secondary | ICD-10-CM | POA: Insufficient documentation

## 2016-01-15 DIAGNOSIS — M25562 Pain in left knee: Secondary | ICD-10-CM | POA: Insufficient documentation

## 2016-01-15 DIAGNOSIS — S52691A Other fracture of lower end of right ulna, initial encounter for closed fracture: Secondary | ICD-10-CM | POA: Diagnosis not present

## 2016-01-15 DIAGNOSIS — D6861 Antiphospholipid syndrome: Secondary | ICD-10-CM | POA: Diagnosis not present

## 2016-01-15 DIAGNOSIS — S6991XA Unspecified injury of right wrist, hand and finger(s), initial encounter: Secondary | ICD-10-CM | POA: Diagnosis present

## 2016-01-15 DIAGNOSIS — S52201A Unspecified fracture of shaft of right ulna, initial encounter for closed fracture: Secondary | ICD-10-CM

## 2016-01-15 DIAGNOSIS — Z91040 Latex allergy status: Secondary | ICD-10-CM | POA: Diagnosis not present

## 2016-01-15 DIAGNOSIS — Y92007 Garden or yard of unspecified non-institutional (private) residence as the place of occurrence of the external cause: Secondary | ICD-10-CM | POA: Insufficient documentation

## 2016-01-15 DIAGNOSIS — S5291XA Unspecified fracture of right forearm, initial encounter for closed fracture: Secondary | ICD-10-CM

## 2016-01-15 LAB — CBC WITH DIFFERENTIAL/PLATELET
BASOS ABS: 0 10*3/uL (ref 0.0–0.1)
BASOS PCT: 0 %
EOS ABS: 0.1 10*3/uL (ref 0.0–0.7)
EOS PCT: 2 %
HCT: 43 % (ref 36.0–46.0)
Hemoglobin: 13.7 g/dL (ref 12.0–15.0)
LYMPHS PCT: 39 %
Lymphs Abs: 2.2 10*3/uL (ref 0.7–4.0)
MCH: 29 pg (ref 26.0–34.0)
MCHC: 31.9 g/dL (ref 30.0–36.0)
MCV: 90.9 fL (ref 78.0–100.0)
MONO ABS: 0.7 10*3/uL (ref 0.1–1.0)
Monocytes Relative: 12 %
Neutro Abs: 2.5 10*3/uL (ref 1.7–7.7)
Neutrophils Relative %: 47 %
PLATELETS: 266 10*3/uL (ref 150–400)
RBC: 4.73 MIL/uL (ref 3.87–5.11)
RDW: 13.6 % (ref 11.5–15.5)
WBC: 5.5 10*3/uL (ref 4.0–10.5)

## 2016-01-15 LAB — IRON AND TIBC
Iron: 49 ug/dL (ref 28–170)
Saturation Ratios: 16 % (ref 10.4–31.8)
TIBC: 302 ug/dL (ref 250–450)
UIBC: 253 ug/dL

## 2016-01-15 LAB — PROTIME-INR
INR: 2.56
Prothrombin Time: 28 seconds — ABNORMAL HIGH (ref 11.4–15.2)

## 2016-01-15 LAB — FERRITIN: Ferritin: 69 ng/mL (ref 11–307)

## 2016-01-15 MED ORDER — ONDANSETRON HCL 4 MG/2ML IJ SOLN
4.0000 mg | Freq: Once | INTRAMUSCULAR | Status: AC
  Administered 2016-01-15: 4 mg via INTRAVENOUS
  Filled 2016-01-15: qty 2

## 2016-01-15 MED ORDER — ONDANSETRON HCL 4 MG PO TABS: 4.0000 mg | ORAL_TABLET | Freq: Three times a day (TID) | ORAL | 0 refills | Status: DC | PRN

## 2016-01-15 MED ORDER — HYDROCODONE-ACETAMINOPHEN 5-325 MG PO TABS: 1.0000 | ORAL_TABLET | ORAL | 0 refills | Status: DC | PRN

## 2016-01-15 MED ORDER — HYDROCODONE-ACETAMINOPHEN 5-325 MG PO TABS
1.0000 | ORAL_TABLET | Freq: Once | ORAL | Status: AC
  Administered 2016-01-15: 1 via ORAL
  Filled 2016-01-15: qty 1

## 2016-01-15 MED ORDER — FENTANYL CITRATE (PF) 100 MCG/2ML IJ SOLN
50.0000 ug | Freq: Once | INTRAMUSCULAR | Status: AC
  Administered 2016-01-15: 50 ug via INTRAVENOUS
  Filled 2016-01-15: qty 2

## 2016-01-15 NOTE — ED Notes (Signed)
Patient states that she is not having any pain at this time as long as she does not move it.

## 2016-01-15 NOTE — ED Provider Notes (Signed)
[facility] DEPT Provider Note   CSN: [id number] Arrival date & time: 01/15/16  1849     History   Chief Complaint Chief Complaint  Patient presents with  . Fall    HPI [name] is a 65 y.o. female.  Pt is a 65 y/o F with PMH of factor v leiden, DVT (on coumadin for approx 30 years), anemia, gerd and previous GIST who presents to ED for R forearm pain and deformity, onset approx 1 hr PTA after mechanical trip and fall in yard causing her to land on R forearm, unsure if [place], no head trauma or LOC. Denies extremity numbness/tingling. Left hand dominant. Had one episode of n/v on the way to ER secondary to pain. Denies fevers, chills, dizziness, vision or gait changes, Cp, SOB, abd pain, or any additional concerns.    The history is provided by the patient. No language interpreter was used.  Fall  Pertinent negatives include no chest pain, no abdominal pain, no headaches and no shortness of breath.    Past Medical History:  Diagnosis Date  . Allergic urticaria 01/04/2011   Rash from tape.  . Anxiety   . Cellulitis of left leg 2006  . Clotting disorder ([place])    heterozygosity from factor v leiden  . DVT (deep venous thrombosis) ([place]) 07/10/2010   on coumadin  . Factor V Leiden ([place])   . GERD (gastroesophageal reflux disease)   . GIST (gastrointestinal stromal tumor), malignant ([place]) 01/03/2011   S/P resection on 01/05/11.  Intolerant to [employer].   Marland Kitchen Pernicious anemia 07/10/2010  . Small bowel mass 01/03/2011   s/p surgery  . Ulcer ([place]) 05/2009   esophageal  . Ventricular tachycardia ([place]) 03/26/11  . Vitamin B12 deficiency    vit b12 1000 mcg monthly    Patient Active Problem List   Diagnosis Date Noted  . Small bowel obstruction 01/23/2015  . SBO (small bowel obstruction) 01/23/2015  . Depression 11/19/2014  . Abnormal CT scan, colon 09/21/2012  . Bradycardia 04/19/2011  . Hypotension, iatrogenic 03/26/2011  . Ventricular tachycardia ([place]) 03/26/2011  .  SVT (supraventricular tachycardia) ([place]) 03/25/2011  . Allergic urticaria 01/04/2011  . Lip swelling 01/03/2011  . GIST (gastrointestinal stromal tumor), malignant ([place]) 01/03/2011  . Syncope 01/01/2011  . Acute blood loss anemia 01/01/2011  . Chronic ulcer of left leg ([place]) 01/01/2011  . Chronic anticoagulation 01/01/2011  . Abnormal CXR 01/01/2011  . Factor V Leiden ([place])   . DVT (deep venous thrombosis) ([place]) 07/10/2010  . Pernicious anemia 07/10/2010  . CONTUSION, ARM 02/25/2010  . Iron deficiency anemia 08/27/2009  . WEIGHT GAIN 08/27/2009  . Reflux esophagitis 07/05/2009  . GI BLEEDING 06/11/2009    Past Surgical History:  Procedure Laterality Date  . ABDOMINAL HYSTERECTOMY  1989  . BALLOON DILATION  12/11/2010   Procedure: BALLOON DILATION;  Surgeon: Rogene Houston, MD;  [facility];  Service: Endoscopy;  Laterality: N/A;  . BOWEL RESECTION  01/05/2011   Procedure: SMALL BOWEL RESECTION;  Surgeon: Jamesetta So;  Location: [facility] OR[facility];  Service: General;;  Partial Small Bowel Resection  . COLONOSCOPY  02/25/2012   Procedure: COLONOSCOPY;  Surgeon: Rogene Houston, MD;  Location: [facility];  Service: Endoscopy;  Laterality: N/A;  1200  . GIVENS CAPSULE STUDY  01/02/2011   Procedure: GIVENS CAPSULE STUDY;  Surgeon: Rogene Houston, MD;  Location: [facility];  Service: Endoscopy;  Laterality: N/A;  . LAPAROTOMY  01/05/2011   Procedure:  EXPLORATORY LAPAROTOMY;  Surgeon: Jamesetta So;  Location: [facility] OR[facility];  Service: General;  Laterality: N/A;    OB History    Gravida Para Term Preterm AB Living   3 3 3     2    SAB TAB Ectopic Multiple Live Births                   Home Medications    Prior to Admission medications   Medication Sig Start Date End Date Taking? Authorizing Provider  acetaminophen (TYLENOL) 500 MG tablet Take 500-1,000 mg by mouth every 6 (six) hours as needed for moderate pain.    Historical Provider, MD  cyanocobalamin (,VITAMIN B-12,)  1000 MCG/ML injection Inject 1 mL (1,000 mcg total) into the muscle every 30 (thirty) days. 10/01/15   Baird Cancer, PA-C  diphenhydrAMINE (BENADRYL) 25 mg capsule Take 25 mg by mouth every 6 (six) hours as needed for allergies.    Historical Provider, MD  ferrous sulfate 325 (65 FE) MG tablet Take 1 tablet (325 mg total) by mouth 3 (three) times a week. 10/04/15   [name], MD  lansoprazole (PREVACID) 15 MG capsule Take 15 mg by mouth daily.     Historical Provider, MD  metoprolol tartrate (LOPRESSOR) 25 MG tablet Take 0.5 tablets (12.5 mg total) by mouth 2 (two) times daily. 10/04/15   [name], MD  warfarin (COUMADIN) 1 MG tablet Take 1 tablet (1 mg total) by mouth daily at 6 PM. 12/11/15   Baird Cancer, PA-C  warfarin (COUMADIN) 5 MG tablet Take 1 tablet (5 mg total) by mouth daily. 09/06/15   Baird Cancer, PA-C    Family History History reviewed. No pertinent family history.  Social History Social History  Substance Use Topics  . Smoking status: Never Smoker  . Smokeless tobacco: Never Used  . Alcohol use No     Allergies   Imatinib; Penicillins; Cephalexin; Dexlansoprazole; Latex; Other; Tape; Enoxaparin sodium; Nylon; Omeprazole; and Sulfonamide derivatives   Review of Systems Review of Systems  Constitutional: Negative for chills, fever and unexpected weight change.  Eyes: Negative for visual disturbance.  Respiratory: Negative for cough and shortness of breath.   Cardiovascular: Negative for chest pain and palpitations.  Gastrointestinal: Positive for nausea and vomiting. Negative for abdominal pain and diarrhea.  Genitourinary: Negative for dysuria.  Musculoskeletal: Negative for back pain and neck pain.       +R arm pain  Skin: Negative for rash and wound.  Neurological: Negative for dizziness, numbness and headaches.  All other systems reviewed and are negative.    Physical Exam Updated Vital Signs BP 139/67 (BP Location: Left Arm)   Pulse 78    Temp 97.8 F (36.6 C) (Temporal)   Resp 16   Ht 5' 7.6" (1.717 m)   Wt 104.3 kg   SpO2 96%   BMI 35.39 kg/m   Physical Exam  Constitutional: She is oriented to person, place, and time. She appears well-developed.  HENT:  Head: Normocephalic and atraumatic.  Right Ear: External ear normal.  Left Ear: External ear normal.  Nose: Nose normal.  Mouth/Throat: Oropharynx is clear and moist.  Eyes: Conjunctivae and EOM are normal. Pupils are equal, round, and reactive to light.  Neck: Normal range of motion. Neck supple.  Cardiovascular: Normal rate, regular rhythm, normal heart sounds and intact distal pulses.   Pulmonary/Chest: Effort normal and breath sounds normal.  Abdominal: Soft. Bowel sounds are normal.  Musculoskeletal:  +  R distal forearm ttp with deformity, radial pulses 2+, cap refill < 2sec, sharp and dull sensation grossly intact to fingers, able to wiggle fingers.  Neurological: She is alert and oriented to person, place, and time.  Skin: Skin is warm and dry. Capillary refill takes less than 2 seconds.  Nursing note and vitals reviewed.    ED Treatments / Results  Labs (all labs ordered are listed, but only abnormal results are displayed) Labs Reviewed - No data to display  EKG  EKG Interpretation None       Radiology No results found.  Procedures Procedures (including critical care time)  Medications Ordered in ED Medications  ondansetron (ZOFRAN) injection 4 mg (not administered)  fentaNYL (SUBLIMAZE) injection 50 mcg (not administered)     Initial Impression / Assessment and Plan / ED Course  I have reviewed the triage vital signs and the nursing notes.  Pertinent labs & imaging results that were available during my care of the patient were reviewed by me and considered in my medical decision making (see chart for details).  Clinical Course    Pt is a 65 y/o F who presents to ED for R distal forearm pain with deformity, concern for  fracture. CBC, BMP, and INR drawn earlier today and reviewed. Will get imaging and give analgesia.   8:05 PM Pending med admin and imaging  9:48 Pt updated on xray results; offered additional pain meds but defers at this time. Plan to place in sugar tong  10:26 PM Discussed discharge instructions, return precautions, RICE, and follow up with Dr Aline Brochure (his office will call her tomorrow); pt verbalizes understanding and agrees with plan  11:09 PM Sugar tong and shoulder sling placed by Demaris Callander. Able to wiggle fingers, sensation intact to fingers, cap refill <2 sec. Pt denies any additional concerns.  Final Clinical Impressions(s) / ED Diagnoses   Final diagnoses:  None    New Prescriptions New Prescriptions   No medications on file     Ulice Bold, NP 01/16/16 0013    Noemi Chapel, MD 01/16/16 2049

## 2016-01-15 NOTE — Progress Notes (Signed)
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  Annual imaging surveillance as well.  INTERVAL HISTORY: [name] 65 y.o. female returns for followup of Heterozygote for Factor V Leiden mutation, on anticoagulation with Vitamin K antagonist. AND GIST, Stage II.  S/P resection on 01/05/2011 by Dr. Arnoldo Morale.    GIST (gastrointestinal stromal tumor), malignant ([place])   01/03/2011 Initial Diagnosis    GIST (gastrointestinal stromal tumor), malignant ([place])      She is doing well.  She denies any issues with bleeding or abnormal bruising.  She denies any blood in her stool or dark stools.  She is compliant with her Coumadin.  She reports ongoing left knee pain.  She tried a brace which was ineffective.  She continues to follow with orthopedist about this issue.  She denies any abnormal abdominal pain, change in appetite, or unintentional weight loss.  She denies any nausea or vomiting too.   Review of Systems  Constitutional: Negative for chills, fever and weight loss.  HENT: Negative.  Negative for nosebleeds.   Respiratory: Negative.  Negative for hemoptysis.   Cardiovascular: Negative.  Negative for chest pain.  Gastrointestinal: Negative for abdominal pain, blood in stool, constipation, diarrhea, melena, nausea and vomiting.  Genitourinary: Negative.  Negative for hematuria.  Skin: Negative.   Neurological: Negative.   Endo/Heme/Allergies: Negative.  Does not bruise/bleed easily.  Psychiatric/Behavioral: Negative.     Past Medical History:  Diagnosis Date  . Allergic urticaria 01/04/2011   Rash from tape.  . Anxiety   .  Cellulitis of left leg 2006  . Clotting disorder ([place])    heterozygosity from factor v leiden  . DVT (deep venous thrombosis) ([place]) 07/10/2010   on coumadin  . Factor V Leiden ([place])   . GERD (gastroesophageal reflux disease)   . GIST (gastrointestinal stromal tumor), malignant ([place]) 01/03/2011   S/P resection on 01/05/11.  Intolerant to [employer].   Marland Kitchen Pernicious anemia 07/10/2010  . Small bowel mass 01/03/2011   s/p surgery  . Ulcer ([place]) 05/2009   esophageal  . Ventricular tachycardia ([place]) 03/26/11  . Vitamin B12 deficiency    vit b12 1000 mcg monthly    Past Surgical History:  Procedure Laterality Date  . ABDOMINAL HYSTERECTOMY  1989  . BALLOON DILATION  12/11/2010   Procedure: BALLOON DILATION;  Surgeon: Rogene Houston, MD;  Location: [facility] END[facility] SU[facility];  Service: Endoscopy;  Laterality: N/A;  . BOWEL RESECTION  01/05/2011   Procedure: SMALL BOWEL RESECTION;  Surgeon: Jamesetta So;  Location: [facility] ORS;  Service: General;;  Partial Small Bowel Resection  . COLONOSCOPY  02/25/2012   Procedure: COLONOSCOPY;  Surgeon: Rogene Houston, MD;  Location: [facility] ENDO SUITE;  Service: Endoscopy;  Laterality: N/A;  1200  . GIVENS CAPSULE STUDY  01/02/2011   Procedure: GIVENS CAPSULE STUDY;  Surgeon: Rogene Houston, MD;  Location: [facility] ENDO SUITE;  Service: Endoscopy;  Laterality: N/A;  . LAPAROTOMY  01/05/2011   Procedure: EXPLORATORY LAPAROTOMY;  Surgeon: Jamesetta So;  Location: [facility] ORS;  Service: General;  Laterality: N/A;    History reviewed. No pertinent family history.  Social History  Social History  . Marital status: Married    Spouse name: N/A  . Number of children: N/A  . Years of education: N/A   Social History Main Topics  . Smoking status: Never Smoker  . Smokeless tobacco: Never Used  . Alcohol use No  . Drug use: No  . Sexual activity: Not Currently   Other Topics Concern  . None   Social History Narrative  . None     PHYSICAL EXAMINATION  ECOG PERFORMANCE STATUS: 1  - Symptomatic but completely ambulatory  Vitals:   01/15/16 1002  BP: (!) 141/70  Pulse: 62  Resp: 18  Temp: 97.6 F (36.4 C)    GENERAL:alert, no distress, well nourished, well developed, comfortable, cooperative, obese, smiling and unaccompanied SKIN: skin color, texture, turgor are normal, no rashes or significant lesions HEAD: Normocephalic, No masses, lesions, tenderness or abnormalities EYES: normal, PERRLA, Conjunctiva are pink and non-injected EARS: External ears normal OROPHARYNX:lips, buccal mucosa, and tongue normal and mucous membranes are moist  NECK: supple, no adenopathy, thyroid normal size, non-tender, without nodularity, trachea midline LYMPH:  not examined BREAST:not examined LUNGS: clear to auscultation  HEART: regular rate & rhythm, no murmurs, no gallops, S1 normal and S2 normal ABDOMEN:abdomen soft, non-tender, obese and normal bowel sounds BACK: Back symmetric, no curvature. EXTREMITIES:less then 2 second capillary refill, no joint deformities, effusion, or inflammation, no skin discoloration, no cyanosis  NEURO: alert & oriented x 3 with fluent speech, no focal motor/sensory deficits, gait normal   LABORATORY DATA: CBC    Component Value Date/Time   WBC 5.5 01/15/2016 0938   RBC 4.73 01/15/2016 0938   HGB 13.7 01/15/2016 0938   HCT 43.0 01/15/2016 0938   PLT 266 01/15/2016 0938   MCV 90.9 01/15/2016 0938   MCH 29.0 01/15/2016 0938   MCHC 31.9 01/15/2016 0938   RDW 13.6 01/15/2016 0938   LYMPHSABS 2.2 01/15/2016 0938   MONOABS 0.7 01/15/2016 0938   EOSABS 0.1 01/15/2016 0938   BASOSABS 0.0 01/15/2016 0938      Chemistry      Component Value Date/Time   NA 141 01/24/2015 0707   K 3.8 01/24/2015 0707   CL 108 01/24/2015 0707   CO2 29 01/24/2015 0707   BUN 9 01/24/2015 0707   CREATININE 0.66 01/24/2015 0707      Component Value Date/Time   CALCIUM 8.9 01/24/2015 0707   ALKPHOS 77 01/23/2015 0025   AST 26 01/23/2015 0025   ALT 31  01/23/2015 0025   BILITOT 0.8 01/23/2015 0025     Lab Results  Component Value Date   INR 2.56 01/15/2016   INR 1.97 12/11/2015   INR 2.28 11/20/2015   Lab Results  Component Value Date   IRON 83 01/16/2014   TIBC 275 01/16/2014   FERRITIN 40 08/19/2015     PENDING LABS:   RADIOGRAPHIC STUDIES:  No results found.   PATHOLOGY:    ASSESSMENT AND PLAN:  Factor V Leiden ([place]) Factor V Leiden, on lifelong anticoagulation with Vitamin K antagonist.  Labs today: CBC diff, iron/TIBC, ferritin, PT/INR.  I personally reviewed and went over laboratory results with the patient.  The results are noted within this dictation.  INR is therapeutic today.  Continue same dose of Coumadin.  Labs in 1 month: PT/INR  Labs in 6 months: CBC diff, BMET, iron/TIBC, ferritin, PT/INR.  I personally reviewed and went over radiographic studies with the patient.  The results are noted within this dictation.  Mammogram in  August 2017 was negative.    She is following with an orthopod at [facility] orthopedic for her left knee.  She reports a discussion of total knee arthroplasty which she is resistant to as a result of a degenerative joint.  Return in 6 months for follow-up with ongoing INR checks as directed.    GIST (gastrointestinal stromal tumor), malignant ([place]) Stage II GIST status post resection on 01/05/2011,   She was started on adjuvant imatinib that was complicated by cardiac dysrhythmia adverse effects.   Annual CT imaging has been negative for recurrence thus far.  Order placed for CT abd/pelvis with contrast.   ORDERS PLACED FOR THIS ENCOUNTER: Orders Placed This Encounter  Procedures  . CBC with Differential  . Basic metabolic panel  . Ferritin  . Iron and TIBC    MEDICATIONS PRESCRIBED THIS ENCOUNTER: No orders of the defined types were placed in this encounter.   THERAPY PLAN:  Continue anticoagulation as directed and we will monitor for recurrence of GIST with annual  imaging.  All questions were answered. The patient knows to call the clinic with any problems, questions or concerns. We can certainly see the patient much sooner if necessary.  Patient and plan discussed with Dr. Ancil Linsey and she is in agreement with the aforementioned.   This note is electronically signed by: Robynn Pane, PA-C 01/15/2016 1:11 PM

## 2016-01-15 NOTE — ED Triage Notes (Signed)
Pt fell injuring her R forearm, deformity noted.

## 2016-01-15 NOTE — ED Provider Notes (Signed)
The patient is a 65 year old female, currently being treated for DVT, factor V Leiden history, on Coumadin, blood checked this morning showed INR of 2.6. She presents to the hospital after having a trip and fall approximately one hour ago landing on her right forearm, she does not know the exact mechanism but thinks that it was underneath her and she fell. She denies any other symptoms, she has no numbness of her hand and on exam she has a mid to distal forearm deformity, she is able to make a grip but it is weak, normal pulses at the wrist, normal sensation all the fingers. Exam is limited only by pain. There is no other injuries on her body including the legs or the head, her heart rate is regular without murmurs and she otherwise appears well. I placed an IV in the left antecubital fossa as the nurse was busy in another room, she will get fentanyl 50 g, x-ray.  I d/w Dr. Aline Brochure - will see in the office in 2 days - they will arrange f/u and to see her - he recommends splinting with sugar tong and sling - f/u in office, pt is agreeable.  Angiocath insertion Performed by: Johnna Acosta  Consent: Verbal consent obtained. Risks and benefits: risks, benefits and alternatives were discussed Time out: Immediately prior to procedure a "time out" was called to verify the correct patient, procedure, equipment, support staff and site/side marked as required.  Preparation: Patient was prepped and draped in the usual sterile fashion.  Vein Location: L AC  Not Ultrasound Guided  Gauge: 20  Normal blood return and flush without difficulty Patient tolerance: Patient tolerated the procedure well with no immediate complications.     Medical screening examination/treatment/procedure(s) were conducted as a shared visit with non-physician practitioner(s) and myself.  I personally evaluated the patient during the encounter.  Clinical Impression:   Final diagnoses:  Radius/ulna fracture, right, closed,  initial encounter         Noemi Chapel, MD 01/16/16 2048

## 2016-01-15 NOTE — Assessment & Plan Note (Addendum)
Factor V Leiden, on lifelong anticoagulation with Vitamin K antagonist.  Labs today: CBC diff, iron/TIBC, ferritin, PT/INR.  I personally reviewed and went over laboratory results with the patient.  The results are noted within this dictation.  INR is therapeutic today.  Continue same dose of Coumadin.  Labs in 1 month: PT/INR  Labs in 6 months: CBC diff, BMET, iron/TIBC, ferritin, PT/INR.  I personally reviewed and went over radiographic studies with the patient.  The results are noted within this dictation.  Mammogram in August 2017 was negative.    She is following with an orthopod at [facility] for her left knee.  She reports a discussion of total knee arthroplasty which she is resistant to as a result of a degenerative joint.  Return in 6 months for follow-up with ongoing INR checks as directed.

## 2016-01-15 NOTE — ED Notes (Signed)
Patient transported to X-ray 

## 2016-01-15 NOTE — Discharge Instructions (Signed)
Keep splint in place. Keep clean and dry. Rest arm to prevent further injury/pain. Apply ice to area for 20 minutes at least 4 times a day. Elevate arm to decrease swelling.   Take Vicodin as prescribed for pain--caution may cause sedation--do not drink alcohol, drive, or operate machinery while taking. Take Zofran as needed for nausea.   Dr. Dorothyann Peng [facility]'s office will call you tomorrow to schedule an appointment for Friday 01/17/16.   Return to ER if you experience fevers, chills, dizziness, chest pain, shortness of breath, abdominal pain, nausea/vomiting, decreased sensation to arm/fingers, difficulty moving fingers, redness/swelling, arm/fingers pale/cool to touch, worsening pain, or any additional concerns.

## 2016-01-15 NOTE — Patient Instructions (Addendum)
[place] at [facility] Discharge Instructions  RECOMMENDATIONS MADE BY THE CONSULTANT AND ANY TEST RESULTS WILL BE SENT TO YOUR REFERRING PHYSICIAN.  You were seen today by Kirby Crigler PA-C. Return in 4 weeks for INR. CT scan in about a week. Return in 6 months for labs and follow up.  Thank you for choosing [place] at [facility] to provide your oncology and hematology care.  To afford each patient quality time with our provider, please arrive at least 15 minutes before your scheduled appointment time.   Beginning January 23rd 2017 lab work for the [facility] will be done in the  Main lab at [facility] on 1st floor. If you have a lab appointment with the [facility] please come in thru the  Main Entrance and check in at the main information desk  You need to re-schedule your appointment should you arrive 10 or more minutes late.  We strive to give you quality time with our providers, and arriving late affects you and other patients whose appointments are after yours.  Also, if you no show three or more times for appointments you may be dismissed from the clinic at the providers discretion.     Again, thank you for choosing [facility].  Our hope is that these requests will decrease the amount of time that you wait before being seen by our physicians.       _____________________________________________________________  Should you have questions after your visit to [facility], please contact our office at [phone] between the hours of 8:30 a.m. and 4:30 p.m.  Voicemails left after 4:30 p.m. will not be returned until the following business day.  For prescription refill requests, have your pharmacy contact our office.         Resources For Cancer Patients and their Caregivers ? American Cancer Society: Can assist with transportation, wigs, general needs, runs Look Good Feel Better.         318-168-8432 ? Cancer Care: Provides financial assistance, online support groups, medication/co-pay assistance.  1-800-813-HOPE (773) 077-7679) ? [place] Assists [place] cancer patients and their families through emotional , educational and financial support.  (984)001-3082 ? [place] DSS Where to apply for food stamps, Medicaid and utility assistance. (332)412-4727 ? [facility]: Transportation to medical appointments. (780)476-2285 ? Social Security Administration: May apply for disability if have a Stage IV cancer. (430) 609-6489 726-623-0517 ? [employer], Disability and Transit Services: Assists with nutrition, care and transit needs. [place] Support Programs: @10RELATIVEDAYS @ > Cancer Support Group  2nd Tuesday of the month 1pm-2pm, Journey Room  > Creative Journey  3rd Tuesday of the month 1130am-1pm, Journey Room  > Look Good Feel Better  1st Wednesday of the month 10am-12 noon, Journey Room (Call [place] to register [phone])

## 2016-01-15 NOTE — Assessment & Plan Note (Signed)
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   Annual CT imaging has been negative for recurrence thus far.  Order placed for CT abd/pelvis with contrast.

## 2016-01-15 NOTE — ED Triage Notes (Signed)
Pt states she is currently on coumadin.

## 2016-01-17 ENCOUNTER — Encounter: Payer: Self-pay | Admitting: Orthopedic Surgery

## 2016-01-17 ENCOUNTER — Ambulatory Visit (INDEPENDENT_AMBULATORY_CARE_PROVIDER_SITE_OTHER): Payer: Medicare Other | Admitting: Orthopedic Surgery

## 2016-01-17 VITALS — BP 139/81 | HR 64 | Ht 68.0 in | Wt 250.0 lb

## 2016-01-17 DIAGNOSIS — S62101A Fracture of unspecified carpal bone, right wrist, initial encounter for closed fracture: Secondary | ICD-10-CM | POA: Diagnosis not present

## 2016-01-17 MED ORDER — HYDROCODONE-ACETAMINOPHEN 5-325 MG PO TABS: 1.0000 | ORAL_TABLET | ORAL | 0 refills | Status: DC | PRN

## 2016-01-17 NOTE — Progress Notes (Signed)
Patient ID: [name], female   DOB: 06/28/1950, 65 y.o.   MRN: [id number]  Chief Complaint  Patient presents with  . Follow-up    ER follow up on right wrist fracture, DOI     HPI [name] is a 65 y.o. female.  65 year old female fell injured her right wrist. She has factor V Leyden disease and history of clots. She is on warfarin. HPI Date of injury December 20 Pain right wrist Quality dull aching Severity initially 9 currently 5 Duration 2 days Timing constant   Review of Systems Review of Systems  Constitutional: Negative for fever.  Respiratory: Negative.   Cardiovascular: Negative.   Gastrointestinal: Positive for nausea.     Past Medical History:  Diagnosis Date  . Allergic urticaria 01/04/2011   Rash from tape.  . Anxiety   . Cellulitis of left leg 2006  . Clotting disorder ([place])    heterozygosity from factor v leiden  . DVT (deep venous thrombosis) ([place]) 07/10/2010   on coumadin  . Factor V Leiden ([place])   . GERD (gastroesophageal reflux disease)   . GIST (gastrointestinal stromal tumor), malignant ([place]) 01/03/2011   S/P resection on 01/05/11.  Intolerant to [employer].   Marland Kitchen Pernicious anemia 07/10/2010  . Small bowel mass 01/03/2011   s/p surgery  . Ulcer ([place]) 05/2009   esophageal  . Ventricular tachycardia ([place]) 03/26/11  . Vitamin B12 deficiency    vit b12 1000 mcg monthly    Past Surgical History:  Procedure Laterality Date  . ABDOMINAL HYSTERECTOMY  1989  . BALLOON DILATION  12/11/2010   Procedure: BALLOON DILATION;  Surgeon: Rogene Houston, MD;  [facility];  Service: Endoscopy;  Laterality: N/A;  . BOWEL RESECTION  01/05/2011   Procedure: SMALL BOWEL RESECTION;  Surgeon: Jamesetta So;  Location: [facility] ORS;  Service: General;;  Partial Small Bowel Resection  . COLONOSCOPY  02/25/2012   Procedure: COLONOSCOPY;  Surgeon: Rogene Houston, MD;  Location: [facility]O SU[facility];  Service: Endoscopy;  Laterality: N/A;  1200  . GIVENS CAPSULE  STUDY  01/02/2011   Procedure: GIVENS CAPSULE STUDY;  Surgeon: Rogene Houston, MD;  Location: [facility];  Service: Endoscopy;  Laterality: N/A;  . LAPAROTOMY  01/05/2011   Procedure: EXPLORATORY LAPAROTOMY;  Surgeon: Jamesetta So;  Location: [facility] ORS;  Service: General;  Laterality: N/A;      Social History Social History  Substance Use Topics  . Smoking status: Never Smoker  . Smokeless tobacco: Never Used  . Alcohol use No    Allergies  Allergen Reactions  . Imatinib Other (See Comments)    Cardiac dysrhythmia  . Penicillins Anaphylaxis    Has patient had a PCN reaction causing immediate rash, facial/tongue/throat swelling, SOB or lightheadedness with hypotension: No no Has patient had a PCN reaction causing severe rash involving mucus membranes or skin necrosis: No Has patient had a PCN reaction that required hospitalization No Has patient had a PCN reaction occurring within the last 10 years: No If all of the above answers are "NO", then may proceed with Cephalosporin use.   . Cephalexin Swelling  . Dexlansoprazole Swelling  . Latex Itching  . Other Swelling    Patient states that Pecans cause her mouth to swell.  . Tape Itching  . Enoxaparin Sodium Itching, Swelling and Palpitations  . Nylon Rash  . Omeprazole Itching and Rash  . Sulfonamide Derivatives Rash    Current Outpatient Prescriptions  Medication Sig  Dispense Refill  . acetaminophen (TYLENOL) 500 MG tablet Take 500-1,000 mg by mouth every 6 (six) hours as needed for moderate pain.    . cyanocobalamin (,VITAMIN B-12,) 1000 MCG/ML injection Inject 1 mL (1,000 mcg total) into the muscle every 30 (thirty) days. 1 mL 11  . diphenhydrAMINE (BENADRYL) 25 mg capsule Take 25 mg by mouth every 6 (six) hours as needed for allergies.    . ferrous sulfate 325 (65 FE) MG tablet Take 1 tablet (325 mg total) by mouth 3 (three) times a week. 30 tablet 2  . HYDROcodone-acetaminophen (NORCO/VICODIN) 5-325 MG tablet Take 1  tablet by mouth every 4 (four) hours as needed for moderate pain or severe pain. 15 tablet 0  . metoprolol tartrate (LOPRESSOR) 25 MG tablet Take 0.5 tablets (12.5 mg total) by mouth 2 (two) times daily. 90 tablet 2  . omeprazole (PRILOSEC) 20 MG capsule Take 20 mg by mouth daily.    . ondansetron (ZOFRAN) 4 MG tablet Take 1 tablet (4 mg total) by mouth every 8 (eight) hours as needed for nausea or vomiting. 12 tablet 0  . warfarin (COUMADIN) 1 MG tablet Take 1 tablet (1 mg total) by mouth daily at 6 PM. 30 tablet 5  . warfarin (COUMADIN) 5 MG tablet Take 1 tablet (5 mg total) by mouth daily. 30 tablet 6   No current facility-administered medications for this visit.        Physical Exam Blood pressure 139/81, pulse 64, height 5\' 8"  (1.727 m), weight 250 lb (113.4 kg). Physical Exam Ambulatory status Normal ambulatory status, followed by [facility] orthopedics for knee pain  Right wrist and arm. I removed the splint to check the skin and wrist Inspection no skin lesion was noted she was tender over the fracture site she had distal edema to the splint grossly no deformity  Range of motion, deferred because of pain but elbow and shoulder range of motion were normal  Stability tests elbow shoulder reduced without subluxation wrist joint on x-ray no dislocation.  Motor exam normal muscle tone in the right arm   Neurovascular examination is intact  Lymph node palpation is normal  The opposite extremity exhibits normal range of motion stability and strength neurovascular exam is intact, lymph nodes are negative and there is no swelling or tenderness   Data Reviewed  independent image interpretation :  Plain films show fracture the distal radius with dorsal angulation of the articular surface apex volar angulation of the fracture fragments with possible fracture line into the wrist joint at the lunate fossa  Assessment    Encounter Diagnosis  Name Primary?  . Wrist fracture,  closed, right, initial encounter Yes       Plan    I resplinted the wrist with a volar splint encouraged her to start active range of motion in her fingers. I placed a splint and wrap up to the metacarpophalangeal joints to decrease the hand edema  Referral to [facility] for definitive management. Reason for referral: Location and configuration of fracture with the setting of factor V Leyden 5 deficiency and need for warfarin management in the perioperative period  Arther Abbott, MD 01/17/2016 10:13 AM

## 2016-01-17 NOTE — Patient Instructions (Signed)
Referral to Dr [facility] for Wrist fracture

## 2016-01-21 ENCOUNTER — Telehealth (HOSPITAL_COMMUNITY): Payer: Self-pay | Admitting: Oncology

## 2016-01-21 ENCOUNTER — Other Ambulatory Visit (HOSPITAL_COMMUNITY): Payer: Self-pay | Admitting: Oncology

## 2016-01-21 ENCOUNTER — Telehealth (HOSPITAL_COMMUNITY): Payer: Self-pay | Admitting: Emergency Medicine

## 2016-01-21 DIAGNOSIS — D6851 Activated protein C resistance: Secondary | ICD-10-CM

## 2016-01-21 MED ORDER — ENOXAPARIN SODIUM 40 MG/0.4ML ~~LOC~~ SOLN: 110.0000 mg | Freq: Two times a day (BID) | SUBCUTANEOUS | 0 refills | Status: DC

## 2016-01-21 MED ORDER — ENOXAPARIN SODIUM 120 MG/0.8ML ~~LOC~~ SOLN: 110.0000 mg | Freq: Two times a day (BID) | SUBCUTANEOUS | 0 refills | Status: DC

## 2016-01-21 NOTE — Telephone Encounter (Signed)
Explained to pt that she will take her last coumadin on 12/28 (take that dose that day), then start lovenox on 12/29 every 12 hours.  She is to hold lovenox 24 hours prior to surgery. Restart lovenox 24-48 hours based on what the surgeon thinks.  Restart coumadin the same day as the lovenox take both of these together until INR is therapeutic.  Appt 02/06/2016 at 9:10 am for labs.  Pt verbalized understanding.

## 2016-01-21 NOTE — Telephone Encounter (Signed)
Samantha from [facility] called me about [name]  Jacquie fell recently and fracture her arm and now requires surgical fixation.  She has seen Dr. Apolonio Schneiders.    Given her Factor V Leiden heterozygosity, we will need to bridge her from Coumadin to Lovenox.  HETEROZYGOUS Factor V Leiden is not considered a STRONG thrombophilia (without Prothrombin 20210 heterozygosity).  Aldona Bar reports that they plan on taking Zenya to surgery on 1/3 or 01/30/2016.    Anticipating a surgical date of 01/29/2016, she will take her last Coumadin dose on 01/23/2016 and start Lovenox 1 mg/kg every 12 hours on 01/24/2016 in AM.  She is to hold Lovenox 24 hours prior to surgery and then restart Lovenox 24-48 hours after surgery based upon surgeons perceived risk of bleeding post-operatively.    She will restart Coumadin 24-48 hours following surgery (the same day she restarts her Lovenox) until INR is therapeutic.  Lovenox is escribed.  INR check on 02/06/2016.  Robynn Pane, PA-C 01/21/2016 3:59 PM

## 2016-01-24 ENCOUNTER — Ambulatory Visit (HOSPITAL_COMMUNITY): Payer: Medicare Other

## 2016-01-28 ENCOUNTER — Other Ambulatory Visit (HOSPITAL_COMMUNITY): Payer: Self-pay | Admitting: Oncology

## 2016-01-28 DIAGNOSIS — D6851 Activated protein C resistance: Secondary | ICD-10-CM

## 2016-01-28 MED ORDER — ENOXAPARIN SODIUM 120 MG/0.8ML ~~LOC~~ SOLN: 110.0000 mg | Freq: Two times a day (BID) | SUBCUTANEOUS | 0 refills | Status: DC

## 2016-01-28 NOTE — H&P (Signed)
[name] is an 66 y.o. female.   Chief Complaint: COLLES FRACTURE OF RIGHT RADIUS HPI: PATIENT WAS INJURED ON 01/15/16 DUE TO A FALL OVER A GUIDE WIRE.  SHE WAS SEEN IN THE EMERGENCY DEPARTMENT AND PUT INTO A SUGAR TONG SPLINT. SHE WAS SEEN IN THE OFFICE AND SURGERY WAS SCHEDULED.  THE PATIENT IS HERE TODAY FOR SURGERY.  Past Medical History:  Diagnosis Date  . Allergic urticaria 01/04/2011   Rash from tape.  . Anxiety   . Cellulitis of left leg 2006  . Clotting disorder ([place])    heterozygosity from factor v leiden  . DVT (deep venous thrombosis) ([place]) 07/10/2010   on coumadin  . Factor V Leiden ([place])   . GERD (gastroesophageal reflux disease)   . GIST (gastrointestinal stromal tumor), malignant ([place]) 01/03/2011   S/P resection on 01/05/11.  Intolerant to [employer].   Marland Kitchen Pernicious anemia 07/10/2010  . Small bowel mass 01/03/2011   s/p surgery  . Ulcer ([place]) 05/2009   esophageal  . Ventricular tachycardia ([place]) 03/26/11  . Vitamin B12 deficiency    vit b12 1000 mcg monthly    Past Surgical History:  Procedure Laterality Date  . ABDOMINAL HYSTERECTOMY  1989  . BALLOON DILATION  12/11/2010   Procedure: BALLOON DILATION;  Surgeon: Rogene Houston, MD;  Location: [facility];  Service: Endoscopy;  Laterality: N/A;  . BOWEL RESECTION  01/05/2011   Procedure: SMALL BOWEL RESECTION;  Surgeon: Jamesetta So;  Location: [facility] ORS;  Service: General;;  Partial Small Bowel Resection  . COLONOSCOPY  02/25/2012   Procedure: COLONOSCOPY;  Surgeon: Rogene Houston, MD;  Location: [facility];  Service: Endoscopy;  Laterality: N/A;  1200  . GIVENS CAPSULE STUDY  01/02/2011   Procedure: GIVENS CAPSULE STUDY;  Surgeon: Rogene Houston, MD;  Location: [facility];  Service: Endoscopy;  Laterality: N/A;  . LAPAROTOMY  01/05/2011   Procedure: EXPLORATORY LAPAROTOMY;  Surgeon: Jamesetta So;  Location: [facility];  Service: General;  Laterality: N/A;    No family history on file. Social  History:  reports that she has never smoked. She has never used smokeless tobacco. She reports that she does not drink alcohol or use drugs.  Allergies:  Allergies  Allergen Reactions  . Imatinib Other (See Comments)    Cardiac dysrhythmia  . Penicillins Anaphylaxis    Has patient had a PCN reaction causing immediate rash, facial/tongue/throat swelling, SOB or lightheadedness with hypotension: No no Has patient had a PCN reaction causing severe rash involving mucus membranes or skin necrosis: No Has patient had a PCN reaction that required hospitalization No Has patient had a PCN reaction occurring within the last 10 years: No If all of the above answers are "NO", then may proceed with Cephalosporin use.   . Cephalexin Swelling  . Dexlansoprazole Swelling  . Latex Itching  . Other Swelling    Patient states that Pecans cause her mouth to swell.  . Tape Itching    Paper tape is ok  . Bee Venom Swelling and Rash  . Nylon Rash  . Sulfonamide Derivatives Rash    No prescriptions prior to admission.    No results found for this or any previous visit (from the past 48 hour(s)). No results found.  ROS NO RECENT ILLNESSES OR HOSPITALIZATIONS  There were no vitals taken for this visit. Physical Exam  General Appearance:  Alert, cooperative, no distress, appears stated age  Head:  Normocephalic, without obvious abnormality,  atraumatic  Eyes:  Pupils equal, conjunctiva/corneas clear,         Throat: Lips, mucosa, and tongue normal; teeth and gums normal  Neck: No visible masses     Lungs:   respirations unlabored  Chest Wall:  No tenderness or deformity  Heart:  Regular rate and rhythm,  Abdomen:   Soft, non-tender,         Extremities: BLOCK IN PLACE FINGERS WARM WELL PERFUSED  Pulses: 2+ and symmetric  Skin: Skin color, texture, turgor normal, no rashes or lesions     Neurologic: Normal    Assessment RIGHT DISTAL RADIUS DISPLACED FRACTURE  Plan RIGHT DISTAL RADIUS  OPEN REDUCTION AND INTERNAL FIXATION WITH REPAIR AS INDICATED  R/B/A DISCUSSED WITH PT IN OFFICE.  PT VOICED UNDERSTANDING OF PLAN CONSENT SIGNED DAY OF SURGERY PT SEEN AND EXAMINED PRIOR TO OPERATIVE PROCEDURE/DAY OF SURGERY SITE MARKED. QUESTIONS ANSWERED WILL GO HOME FOLLOWING SURGERY  WE ARE PLANNING SURGERY FOR YOUR UPPER EXTREMITY. THE RISKS AND BENEFITS OF SURGERY INCLUDE BUT NOT LIMITED TO BLEEDING INFECTION, DAMAGE TO NEARBY NERVES ARTERIES TENDONS, FAILURE OF SURGERY TO ACCOMPLISH ITS INTENDED GOALS, PERSISTENT SYMPTOMS AND NEED FOR FURTHER SURGICAL INTERVENTION. WITH THIS IN MIND WE WILL PROCEED. I HAVE DISCUSSED WITH THE PATIENT THE PRE AND POSTOPERATIVE REGIMEN AND THE DOS AND DON'TS. PT VOICED UNDERSTANDING AND INFORMED CONSENT SIGNED.   Brynda Peon 01/28/2016, 7:21 AM

## 2016-01-30 ENCOUNTER — Encounter (HOSPITAL_COMMUNITY): Payer: Self-pay

## 2016-01-30 ENCOUNTER — Encounter (HOSPITAL_COMMUNITY)
Admission: RE | Admit: 2016-01-30 | Discharge: 2016-01-30 | Disposition: A | Discharge status: Home / Self Care | Payer: PPO | Source: Ambulatory Visit | Attending: Orthopedic Surgery | Admitting: Orthopedic Surgery

## 2016-01-30 DIAGNOSIS — D6851 Activated protein C resistance: Secondary | ICD-10-CM | POA: Diagnosis not present

## 2016-01-30 DIAGNOSIS — K219 Gastro-esophageal reflux disease without esophagitis: Secondary | ICD-10-CM | POA: Diagnosis not present

## 2016-01-30 DIAGNOSIS — F419 Anxiety disorder, unspecified: Secondary | ICD-10-CM | POA: Diagnosis not present

## 2016-01-30 DIAGNOSIS — Z880 Allergy status to penicillin: Secondary | ICD-10-CM | POA: Diagnosis not present

## 2016-01-30 DIAGNOSIS — S52571A Other intraarticular fracture of lower end of right radius, initial encounter for closed fracture: Secondary | ICD-10-CM | POA: Diagnosis not present

## 2016-01-30 DIAGNOSIS — I10 Essential (primary) hypertension: Secondary | ICD-10-CM | POA: Diagnosis not present

## 2016-01-30 DIAGNOSIS — S52579A Other intraarticular fracture of lower end of unspecified radius, initial encounter for closed fracture: Secondary | ICD-10-CM | POA: Diagnosis not present

## 2016-01-30 DIAGNOSIS — I82509 Chronic embolism and thrombosis of unspecified deep veins of unspecified lower extremity: Secondary | ICD-10-CM | POA: Diagnosis not present

## 2016-01-30 DIAGNOSIS — S52601A Unspecified fracture of lower end of right ulna, initial encounter for closed fracture: Secondary | ICD-10-CM | POA: Diagnosis not present

## 2016-01-30 DIAGNOSIS — Z7901 Long term (current) use of anticoagulants: Secondary | ICD-10-CM | POA: Diagnosis not present

## 2016-01-30 DIAGNOSIS — Z882 Allergy status to sulfonamides status: Secondary | ICD-10-CM | POA: Diagnosis not present

## 2016-01-30 HISTORY — DX: Personal history of other medical treatment: Z92.89

## 2016-01-30 HISTORY — DX: Dyspnea, unspecified: R06.00

## 2016-01-30 HISTORY — DX: Essential (primary) hypertension: I10

## 2016-01-30 LAB — BASIC METABOLIC PANEL
ANION GAP: 7 (ref 5–15)
BUN: 10 mg/dL (ref 6–20)
CHLORIDE: 108 mmol/L (ref 101–111)
CO2: 24 mmol/L (ref 22–32)
Calcium: 9.4 mg/dL (ref 8.9–10.3)
Creatinine, Ser: 0.76 mg/dL (ref 0.44–1.00)
GFR calc Af Amer: >60 mL/min (ref >60)
GFR calc non Af Amer: >60 mL/min (ref >60)
GLUCOSE: 98 mg/dL (ref 65–99)
POTASSIUM: 4.2 mmol/L (ref 3.5–5.1)
Sodium: 139 mmol/L (ref 135–145)

## 2016-01-30 LAB — CBC
HEMATOCRIT: 43.4 % (ref 36.0–46.0)
HEMOGLOBIN: 13.9 g/dL (ref 12.0–15.0)
MCH: 28.7 pg (ref 26.0–34.0)
MCHC: 32 g/dL (ref 30.0–36.0)
MCV: 89.5 fL (ref 78.0–100.0)
Platelets: 281 10*3/uL (ref 150–400)
RBC: 4.85 MIL/uL (ref 3.87–5.11)
RDW: 13.9 % (ref 11.5–15.5)
WBC: 6.7 10*3/uL (ref 4.0–10.5)

## 2016-01-30 LAB — PROTIME-INR
INR: 1
Prothrombin Time: 13.2 seconds (ref 11.4–15.2)

## 2016-01-30 NOTE — Pre-Procedure Instructions (Signed)
    IREAN GADOW  01/30/2016    Your procedure is scheduled on Saturday, January 6.  Report to [facility] Emergency Department at 7:40 AM               Your surgery or procedure is scheduled for 9:40 AM   Call this number if you have problems the morning of surgery: [phone]   Remember:  Do not eat food or drink liquids after midnight Friday, January 5.  Take these medicines the morning of surgery with A SIP OF WATER :metoprolol tartrate (LOPRESSOR), omeprazole (PRILOSEC).                   May take HYDROcodone-acetaminophen (NORCO/VICODIN), acetaminophen (TYLENOL)  or  if needed.                 Hold Coumadin and Start Lovenox as instructed by Dr. Caralyn Guile and  Hematologist.   Do not wear jewelry, make-up or nail polish.  Do not wear lotions, powders, or perfumes, or deodorant.  Do not shave 48 hours prior to surgery.    Do not bring valuables to the hospital.  [facility] is not responsible for any belongings or valuables.  Contacts, dentures or bridgework may not be worn into surgery.  Leave your suitcase in the car.  After surgery it may be brought to your room.  For patients admitted to the hospital, discharge time will be determined by your treatment team.  Patients discharged the day of surgery will not be allowed to drive home.   Name and phone number of your driver:-  Special instructions: Review  [place] - Preparing For Surgery.  Please read over the following fact sheets that you were given:  [facility]- Preparing For Surgery , Incentive Spirometry, Pain Booklet

## 2016-01-30 NOTE — Progress Notes (Signed)
[name] has history of Factor V, patient stoped Coumadin on 01/23/16 and is on Lovenox 2 times a day.  Patient has instructions to stop Lovenox Friday, January 5 am.

## 2016-02-01 ENCOUNTER — Encounter (HOSPITAL_COMMUNITY)
Admission: RE | Disposition: A | Discharge status: Home / Self Care | Payer: Self-pay | Source: Ambulatory Visit | Attending: Orthopedic Surgery

## 2016-02-01 ENCOUNTER — Ambulatory Visit (HOSPITAL_COMMUNITY): Payer: PPO | Admitting: Anesthesiology

## 2016-02-01 ENCOUNTER — Ambulatory Visit (HOSPITAL_COMMUNITY)
Admission: RE | Admit: 2016-02-01 | Discharge: 2016-02-01 | Disposition: A | Discharge status: Home / Self Care | Payer: PPO | Source: Ambulatory Visit | Attending: Orthopedic Surgery | Admitting: Orthopedic Surgery

## 2016-02-01 ENCOUNTER — Encounter (HOSPITAL_COMMUNITY): Payer: Self-pay | Admitting: *Deleted

## 2016-02-01 DIAGNOSIS — Z880 Allergy status to penicillin: Secondary | ICD-10-CM | POA: Diagnosis not present

## 2016-02-01 DIAGNOSIS — S52571A Other intraarticular fracture of lower end of right radius, initial encounter for closed fracture: Secondary | ICD-10-CM | POA: Insufficient documentation

## 2016-02-01 DIAGNOSIS — I471 Supraventricular tachycardia: Secondary | ICD-10-CM | POA: Diagnosis not present

## 2016-02-01 DIAGNOSIS — Z882 Allergy status to sulfonamides status: Secondary | ICD-10-CM | POA: Diagnosis not present

## 2016-02-01 DIAGNOSIS — K219 Gastro-esophageal reflux disease without esophagitis: Secondary | ICD-10-CM | POA: Diagnosis not present

## 2016-02-01 DIAGNOSIS — S52601A Unspecified fracture of lower end of right ulna, initial encounter for closed fracture: Secondary | ICD-10-CM | POA: Insufficient documentation

## 2016-02-01 DIAGNOSIS — I10 Essential (primary) hypertension: Secondary | ICD-10-CM | POA: Insufficient documentation

## 2016-02-01 DIAGNOSIS — S52531A Colles' fracture of right radius, initial encounter for closed fracture: Secondary | ICD-10-CM | POA: Diagnosis not present

## 2016-02-01 DIAGNOSIS — Z7901 Long term (current) use of anticoagulants: Secondary | ICD-10-CM | POA: Diagnosis not present

## 2016-02-01 DIAGNOSIS — D6851 Activated protein C resistance: Secondary | ICD-10-CM | POA: Diagnosis not present

## 2016-02-01 DIAGNOSIS — S52291A Other fracture of shaft of right ulna, initial encounter for closed fracture: Secondary | ICD-10-CM | POA: Diagnosis not present

## 2016-02-01 DIAGNOSIS — F419 Anxiety disorder, unspecified: Secondary | ICD-10-CM | POA: Diagnosis not present

## 2016-02-01 DIAGNOSIS — S52501A Unspecified fracture of the lower end of right radius, initial encounter for closed fracture: Secondary | ICD-10-CM

## 2016-02-01 DIAGNOSIS — I82509 Chronic embolism and thrombosis of unspecified deep veins of unspecified lower extremity: Secondary | ICD-10-CM | POA: Diagnosis not present

## 2016-02-01 DIAGNOSIS — I472 Ventricular tachycardia: Secondary | ICD-10-CM | POA: Diagnosis not present

## 2016-02-01 DIAGNOSIS — G8918 Other acute postprocedural pain: Secondary | ICD-10-CM | POA: Diagnosis not present

## 2016-02-01 HISTORY — PX: OPEN REDUCTION INTERNAL FIXATION (ORIF) SCAPHOID WITH DISTAL RADIUS GRAFT: SHX5667

## 2016-02-01 SURGERY — OPEN REDUCTION INTERNAL FIXATION (ORIF) SCAPHOID WITH DISTAL RADIUS GRAFT
Anesthesia: Regional | Site: Arm Lower | Laterality: Right

## 2016-02-01 MED ORDER — FENTANYL CITRATE (PF) 100 MCG/2ML IJ SOLN
INTRAMUSCULAR | Status: DC | PRN
  Administered 2016-02-01 (×2): 50 ug via INTRAVENOUS

## 2016-02-01 MED ORDER — CLINDAMYCIN PHOSPHATE 900 MG/50ML IV SOLN
900.0000 mg | INTRAVENOUS | Status: AC
  Administered 2016-02-01: 900 mg via INTRAVENOUS

## 2016-02-01 MED ORDER — BUPIVACAINE HCL (PF) 0.25 % IJ SOLN
INTRAMUSCULAR | Status: AC
  Filled 2016-02-01: qty 30

## 2016-02-01 MED ORDER — LIDOCAINE 2% (20 MG/ML) 5 ML SYRINGE
INTRAMUSCULAR | Status: AC
  Filled 2016-02-01: qty 5

## 2016-02-01 MED ORDER — 0.9 % SODIUM CHLORIDE (POUR BTL) OPTIME
TOPICAL | Status: DC | PRN
  Administered 2016-02-01: 1000 mL

## 2016-02-01 MED ORDER — FENTANYL CITRATE (PF) 100 MCG/2ML IJ SOLN
INTRAMUSCULAR | Status: AC
  Filled 2016-02-01: qty 2

## 2016-02-01 MED ORDER — PHENYLEPHRINE 40 MCG/ML (10ML) SYRINGE FOR IV PUSH (FOR BLOOD PRESSURE SUPPORT)
PREFILLED_SYRINGE | INTRAVENOUS | Status: AC
  Filled 2016-02-01: qty 10

## 2016-02-01 MED ORDER — PROPOFOL 10 MG/ML IV BOLUS
INTRAVENOUS | Status: AC
  Filled 2016-02-01: qty 40

## 2016-02-01 MED ORDER — CLINDAMYCIN PHOSPHATE 900 MG/50ML IV SOLN
INTRAVENOUS | Status: AC
  Filled 2016-02-01: qty 50

## 2016-02-01 MED ORDER — MIDAZOLAM HCL 2 MG/2ML IJ SOLN
INTRAMUSCULAR | Status: AC
  Filled 2016-02-01: qty 2

## 2016-02-01 MED ORDER — PHENYLEPHRINE HCL 10 MG/ML IJ SOLN
INTRAMUSCULAR | Status: DC | PRN
  Administered 2016-02-01: 80 ug via INTRAVENOUS

## 2016-02-01 MED ORDER — LIDOCAINE HCL (PF) 1 % IJ SOLN
INTRAMUSCULAR | Status: AC
  Filled 2016-02-01: qty 30

## 2016-02-01 MED ORDER — ONDANSETRON HCL 4 MG/2ML IJ SOLN
INTRAMUSCULAR | Status: AC
  Filled 2016-02-01: qty 2

## 2016-02-01 MED ORDER — LACTATED RINGERS IV SOLN
INTRAVENOUS | Status: DC
  Administered 2016-02-01 (×2): via INTRAVENOUS

## 2016-02-01 MED ORDER — CHLORHEXIDINE GLUCONATE 4 % EX LIQD: 60.0000 mL | Freq: Once | CUTANEOUS | Status: DC

## 2016-02-01 MED ORDER — EPHEDRINE SULFATE 50 MG/ML IJ SOLN
INTRAMUSCULAR | Status: DC | PRN
  Administered 2016-02-01 (×2): 10 mg via INTRAVENOUS

## 2016-02-01 MED ORDER — ONDANSETRON HCL 4 MG/2ML IJ SOLN
INTRAMUSCULAR | Status: DC | PRN
  Administered 2016-02-01: 4 mg via INTRAVENOUS

## 2016-02-01 MED ORDER — HYDROMORPHONE HCL 1 MG/ML IJ SOLN: 0.2500 mg | INTRAMUSCULAR | Status: DC | PRN

## 2016-02-01 MED ORDER — LIDOCAINE HCL (CARDIAC) 20 MG/ML IV SOLN
INTRAVENOUS | Status: DC | PRN
  Administered 2016-02-01: 60 mg via INTRAVENOUS

## 2016-02-01 MED ORDER — PROPOFOL 10 MG/ML IV BOLUS
INTRAVENOUS | Status: DC | PRN
  Administered 2016-02-01: 50 mg via INTRAVENOUS
  Administered 2016-02-01: 70 mg via INTRAVENOUS
  Administered 2016-02-01: 30 mg via INTRAVENOUS
  Administered 2016-02-01: 130 mg via INTRAVENOUS
  Administered 2016-02-01: 50 mg via INTRAVENOUS

## 2016-02-01 MED ORDER — EPHEDRINE 5 MG/ML INJ
INTRAVENOUS | Status: AC
  Filled 2016-02-01: qty 10

## 2016-02-01 MED ORDER — BUPIVACAINE-EPINEPHRINE (PF) 0.5% -1:200000 IJ SOLN
INTRAMUSCULAR | Status: DC | PRN
  Administered 2016-02-01: 30 mL via PERINEURAL

## 2016-02-01 MED ORDER — MIDAZOLAM HCL 5 MG/5ML IJ SOLN
INTRAMUSCULAR | Status: DC | PRN
  Administered 2016-02-01 (×2): 1 mg via INTRAVENOUS

## 2016-02-01 SURGICAL SUPPLY — 63 items
BANDAGE ACE 3X5.8 VEL STRL LF (GAUZE/BANDAGES/DRESSINGS) ×2 IMPLANT
BANDAGE ACE 4X5 VEL STRL LF (GAUZE/BANDAGES/DRESSINGS) ×3 IMPLANT
BANDAGE ELASTIC 3 VELCRO ST LF (GAUZE/BANDAGES/DRESSINGS) ×1 IMPLANT
BIT DRILL 2.2 SS TIBIAL (BIT) ×2 IMPLANT
BLADE SURG ROTATE 9660 (MISCELLANEOUS) IMPLANT
BNDG CMPR 9X4 STRL LF SNTH (GAUZE/BANDAGES/DRESSINGS) ×1
BNDG ESMARK 4X9 LF (GAUZE/BANDAGES/DRESSINGS) ×3 IMPLANT
BNDG GAUZE ELAST 4 BULKY (GAUZE/BANDAGES/DRESSINGS) ×3 IMPLANT
CANISTER SUCTION 2500CC (MISCELLANEOUS) ×3 IMPLANT
CORDS BIPOLAR (ELECTRODE) ×3 IMPLANT
COVER SURGICAL LIGHT HANDLE (MISCELLANEOUS) ×3 IMPLANT
CUFF TOURNIQUET SINGLE 18IN (TOURNIQUET CUFF) ×3 IMPLANT
CUFF TOURNIQUET SINGLE 24IN (TOURNIQUET CUFF) IMPLANT
DECANTER SPIKE VIAL GLASS SM (MISCELLANEOUS) ×3 IMPLANT
DRAPE OEC MINIVIEW 54X84 (DRAPES) ×3 IMPLANT
DRAPE SURG 17X11 SM STRL (DRAPES) ×3 IMPLANT
DRSG ADAPTIC 3X8 NADH LF (GAUZE/BANDAGES/DRESSINGS) ×3 IMPLANT
GAUZE SPONGE 4X4 12PLY STRL (GAUZE/BANDAGES/DRESSINGS) ×3 IMPLANT
GAUZE SPONGE 4X4 16PLY XRAY LF (GAUZE/BANDAGES/DRESSINGS) ×1 IMPLANT
GLOVE BIOGEL PI IND STRL 8.5 (GLOVE) ×1 IMPLANT
GLOVE BIOGEL PI INDICATOR 8.5 (GLOVE) ×2
GLOVE SURG ORTHO 8.0 STRL STRW (GLOVE) ×1 IMPLANT
GOWN STRL REUS W/ TWL LRG LVL3 (GOWN DISPOSABLE) ×1 IMPLANT
GOWN STRL REUS W/ TWL XL LVL3 (GOWN DISPOSABLE) ×1 IMPLANT
GOWN STRL REUS W/TWL LRG LVL3 (GOWN DISPOSABLE) ×3
GOWN STRL REUS W/TWL XL LVL3 (GOWN DISPOSABLE) ×3
K-WIRE 1.6 (WIRE) ×3
K-WIRE FX5X1.6XNS BN SS (WIRE) ×1
KIT BASIN OR (CUSTOM PROCEDURE TRAY) ×3 IMPLANT
KIT ROOM TURNOVER OR (KITS) ×3 IMPLANT
KWIRE FX5X1.6XNS BN SS (WIRE) IMPLANT
NDL HYPO 25X1 1.5 SAFETY (NEEDLE) ×1 IMPLANT
NEEDLE HYPO 25X1 1.5 SAFETY (NEEDLE) IMPLANT
NS IRRIG 1000ML POUR BTL (IV SOLUTION) ×3 IMPLANT
PACK ORTHO EXTREMITY (CUSTOM PROCEDURE TRAY) ×3 IMPLANT
PAD ARMBOARD 7.5X6 YLW CONV (MISCELLANEOUS) ×6 IMPLANT
PAD CAST 4YDX4 CTTN HI CHSV (CAST SUPPLIES) ×1 IMPLANT
PADDING CAST COTTON 4X4 STRL (CAST SUPPLIES) ×3
PEG LOCKING SMOOTH 2.2X20 (Screw) ×6 IMPLANT
PEG LOCKING SMOOTH 2.2X22 (Screw) ×2 IMPLANT
PEG LOCKING SMOOTH 2.2X24 (Peg) ×2 IMPLANT
PLATE DVR CROSSLOCK STD RT (Plate) ×2 IMPLANT
SCREW LOCK 14X2.7X 3 LD TPR (Screw) IMPLANT
SCREW LOCK 18X2.7X 3 LD TPR (Screw) IMPLANT
SCREW LOCK 24X2.7X3 LD THRD (Screw) IMPLANT
SCREW LOCKING 2.7X13MM (Screw) ×2 IMPLANT
SCREW LOCKING 2.7X14 (Screw) ×12 IMPLANT
SCREW LOCKING 2.7X18 (Screw) ×3 IMPLANT
SCREW LOCKING 2.7X24MM (Screw) ×3 IMPLANT
SCREW NONLOCK 2.7X28MM (Screw) ×2 IMPLANT
SOAP 2 % CHG 4 OZ (WOUND CARE) ×3 IMPLANT
SPLINT FIBERGLASS 3X35 (CAST SUPPLIES) ×2 IMPLANT
SPONGE LAP 4X18 X RAY DECT (DISPOSABLE) ×1 IMPLANT
SUT PROLENE 4 0 PS 2 18 (SUTURE) ×4 IMPLANT
SUT VIC AB 2-0 FS1 27 (SUTURE) ×2 IMPLANT
SUT VICRYL 4-0 PS2 18IN ABS (SUTURE) ×2 IMPLANT
SYR CONTROL 10ML LL (SYRINGE) IMPLANT
TOWEL OR 17X24 6PK STRL BLUE (TOWEL DISPOSABLE) ×3 IMPLANT
TOWEL OR 17X26 10 PK STRL BLUE (TOWEL DISPOSABLE) ×3 IMPLANT
TUBE CONNECTING 12'X1/4 (SUCTIONS) ×1
TUBE CONNECTING 12X1/4 (SUCTIONS) ×2 IMPLANT
WATER STERILE IRR 1000ML POUR (IV SOLUTION) ×1 IMPLANT
YANKAUER SUCT BULB TIP NO VENT (SUCTIONS) ×2 IMPLANT

## 2016-02-01 NOTE — Transfer of Care (Signed)
  Immediate Anesthesia Transfer of Care Note  Patient: [name]  Procedure(s) Performed: Procedure(s) with comments: Right distal radius open reduction and internal fixation and repair as indicated (Right) - Requests 90 mins  Patient Location: PACU  Anesthesia Type:General and Regional  Level of Consciousness: awake, alert , oriented and sedated  Airway & Oxygen Therapy: Patient Spontanous Breathing and Patient connected to nasal cannula oxygen  Post-op Assessment: Report given to RN, Post -op Vital signs reviewed and stable and Patient moving all extremities  Post vital signs: Reviewed and stable  Last Vitals:  Vitals:   02/01/16 0744  BP: (!) 166/80  Pulse: 74  Resp: 18  Temp: 36.4 C    Last Pain:  Vitals:   02/01/16 0753  TempSrc:   PainSc: 6       Patients Stated Pain Goal: 6 ([id number])  Complications: No apparent anesthesia complications

## 2016-02-01 NOTE — Anesthesia Procedure Notes (Addendum)
Anesthesia Regional Block:  Supraclavicular block  Pre-Anesthetic Checklist: ,, timeout performed, Correct Patient, Correct Site, Correct Laterality, Correct Procedure, Correct Position, site marked, Risks and benefits discussed, pre-op evaluation,  At surgeon's request and post-op pain management  Laterality: Right  Prep: Maximum Sterile Barrier Precautions used, chloraprep       Needles:  Injection technique: Single-shot  Needle Type: Echogenic Stimulator Needle     Needle Length: 5cm 5 cm Needle Gauge: 22 and 22 G    Additional Needles:  Procedures: ultrasound guided (picture in chart) Supraclavicular block Narrative:  Start time: 02/01/2016 8:25 AM End time: 02/01/2016 8:35 AM Injection made incrementally with aspirations every 5 mL. Anesthesiologist: Roderic Palau  Additional Notes: 2% Lidocaine skin wheel.

## 2016-02-01 NOTE — Discharge Instructions (Signed)
Peripheral Nerve Blocks Your caregiver has placed a nerve block in one of your arms or legs to reduce pain and discomfort. The block lessens the amount of pain medicine you will need. Your caregiver will inject you in the arm or leg that was operated on. The injection is usually given away from the surgical site. The injection is a local anesthetic or a combination of local anesthetics. This injection provides numbing pain relief for up to 18 to 24 hours. There are few possible complications from this procedure. However, you should notify your caregiver if you have any problems. Be aware that you may lose feeling at and around the surgical area. If numbness happens, take proper measures to avoid injury. Do not stand up unassisted if you have a nerve block in your leg. Do not try to lift items if you have had a nerve block in your arm. Be careful when placing hot or cold items on a numb extremity. SEEK IMMEDIATE MEDICAL CARE IF:  You have redness, swelling, pain, or discharge at the injection site.  You develop dizziness or lightheadedness.  You have blurred vision.  There is a ringing or buzzing in your ears.  You have a metal taste in your mouth.  You develop numbness or tingling around your mouth.  You develop drowsiness.  You develop confusion. This information is not intended to replace advice given to you by your health care provider. Make sure you discuss any questions you have with your health care provider. Document Released: 04/21/2007 Document Revised: 04/06/2011 Document Reviewed: 09/18/2014 Elsevier Interactive Patient Education  2017 [place] Anesthesia, Adult, Care After These instructions provide you with information about caring for yourself after your procedure. Your health care provider may also give you more specific instructions. Your treatment has been planned according to current medical practices, but problems sometimes occur. Call your health care  provider if you have any problems or questions after your procedure. What can I expect after the procedure? After the procedure, it is common to have:  Vomiting.  A sore throat.  Mental slowness. It is common to feel:  Nauseous.  Cold or shivery.  Sleepy.  Tired.  Sore or achy, even in parts of your body where you did not have surgery. Follow these instructions at home: For at least 24 hours after the procedure:  Do not:  Participate in activities where you could fall or become injured.  Drive.  Use heavy machinery.  Drink alcohol.  Take sleeping pills or medicines that cause drowsiness.  Make important decisions or sign legal documents.  Take care of children on your own.  Rest. Eating and drinking  If you vomit, drink water, juice, or soup when you can drink without vomiting.  Drink enough fluid to keep your urine clear or pale yellow.  Make sure you have little or no nausea before eating solid foods.  Follow the diet recommended by your health care provider. General instructions  Have a responsible adult stay with you until you are awake and alert.  Return to your normal activities as told by your health care provider. Ask your health care provider what activities are safe for you.  Take over-the-counter and prescription medicines only as told by your health care provider.  If you smoke, do not smoke without supervision.  Keep all follow-up visits as told by your health care provider. This is important. Contact a health care provider if:  You continue to have nausea or vomiting at home, and  medicines are not helpful.  You cannot drink fluids or start eating again.  You cannot urinate after 8-12 hours.  You develop a skin rash.  You have fever.  You have increasing redness at the site of your procedure. Get help right away if:  You have difficulty breathing.  You have chest pain.  You have unexpected bleeding.  You feel that you are  having a life-threatening or urgent problem. This information is not intended to replace advice given to you by your health care provider. Make sure you discuss any questions you have with your health care provider. Document Released: 04/20/2000 Document Revised: 06/17/2015 Document Reviewed: 12/27/2014 Elsevier Interactive Patient Education  2017 [place].   From Dr. Caralyn Guile:   KEEP BANDAGE CLEAN AND DRY CALL OFFICE FOR F/U APPT [phone] DR Caralyn Guile CELL [phone] KEEP HAND ELEVATED ABOVE HEART OK TO APPLY ICE TO OPERATIVE AREA CONTACT OFFICE IF ANY WORSENING PAIN OR CONCERNS.

## 2016-02-01 NOTE — Anesthesia Preprocedure Evaluation (Addendum)
Anesthesia Evaluation  Patient identified by MRN, date of birth, ID band Patient awake    Reviewed: Allergy & Precautions, H&P , NPO status , Patient's Chart, lab work & pertinent test results, reviewed documented beta blocker date and time   Airway Mallampati: III  TM Distance: >3 FB Neck ROM: Full    Dental no notable dental hx. (+) Teeth Intact, Dental Advisory Given   Pulmonary neg pulmonary ROS,    Pulmonary exam normal breath sounds clear to auscultation       Cardiovascular hypertension, Pt. on medications and Pt. on home beta blockers  Rhythm:Regular Rate:Normal     Neuro/Psych Anxiety Depression negative neurological ROS  negative psych ROS   GI/Hepatic Neg liver ROS, GERD  Medicated and Controlled,  Endo/Other  negative endocrine ROS  Renal/GU negative Renal ROS  negative genitourinary   Musculoskeletal   Abdominal   Peds  Hematology negative hematology ROS (+) anemia , Factor V Leiden   Anesthesia Other Findings   Reproductive/Obstetrics negative OB ROS                            Anesthesia Physical Anesthesia Plan  ASA: III  Anesthesia Plan: General   Post-op Pain Management: GA combined w/ Regional for post-op pain   Induction: Intravenous  Airway Management Planned: LMA  Additional Equipment:   Intra-op Plan:   Post-operative Plan: Extubation in OR  Informed Consent: I have reviewed the patients History and Physical, chart, labs and discussed the procedure including the risks, benefits and alternatives for the proposed anesthesia with the patient or authorized representative who has indicated his/her understanding and acceptance.   Dental advisory given  Plan Discussed with: CRNA  Anesthesia Plan Comments:         Anesthesia Quick Evaluation

## 2016-02-01 NOTE — Anesthesia Postprocedure Evaluation (Signed)
Anesthesia Post Note  Patient: [name]  Procedure(s) Performed: Procedure(s) (LRB): Right distal radius open reduction and internal fixation and repair as indicated (Right)  Patient location during evaluation: PACU Anesthesia Type: Regional and General Level of consciousness: awake and alert Pain management: pain level controlled Vital Signs Assessment: post-procedure vital signs reviewed and stable Respiratory status: spontaneous breathing, nonlabored ventilation and respiratory function stable Cardiovascular status: blood pressure returned to baseline and stable Postop Assessment: no signs of nausea or vomiting Anesthetic complications: no       Last Vitals:  Vitals:   02/01/16 1100 02/01/16 1115  BP: 121/69   Pulse: 78 76  Resp: 14 13  Temp:      Last Pain:  Vitals:   02/01/16 1115  TempSrc:   PainSc: 0-No pain                 Jacub Waiters,W. EDMOND

## 2016-02-01 NOTE — Anesthesia Procedure Notes (Signed)
Procedure Name: LMA Insertion Date/Time: 02/01/2016 9:18 AM Performed by: Scheryl Darter Pre-anesthesia Checklist: Patient identified, Emergency Drugs available, Suction available and Patient being monitored Patient Re-evaluated:Patient Re-evaluated prior to inductionOxygen Delivery Method: Circle System Utilized Preoxygenation: Pre-oxygenation with 100% oxygen Intubation Type: IV induction Ventilation: Mask ventilation without difficulty LMA: LMA inserted LMA Size: 4.0 Number of attempts: 1 Placement Confirmation: positive ETCO2 Tube secured with: Tape Dental Injury: Teeth and Oropharynx as per pre-operative assessment

## 2016-02-01 NOTE — Progress Notes (Signed)
Orthopedic Tech Progress Note Patient Details:  [name] 08-May-1950 [id number]  Ortho Devices Type of Ortho Device: Arm sling Ortho Device/Splint Location: rue Ortho Device/Splint Interventions: Application   Hildred Priest 02/01/2016, 11:32 AM

## 2016-02-03 NOTE — Brief Op Note (Signed)
Op note dicated orif right distal radius

## 2016-02-03 NOTE — Op Note (Signed)
NAME:  ANGELUS, CONCA                    ACCOUNT NO.:  MEDICAL RECORD NO.:  [id number]  LOCATION:                                 FACILITY:  PHYSICIAN:  Linna Hoff IV, M.D.DATE OF BIRTH:  1951/01/08  DATE OF PROCEDURE:  02/01/2016 DATE OF DISCHARGE:                              OPERATIVE REPORT   PREOPERATIVE DIAGNOSIS:  Right wrist intra-articular distal radius fracture, 3 or more fragments.  POSTOPERATIVE DIAGNOSIS:  Right wrist intra-articular distal radius fracture, 3 or more fragments.  ATTENDING PHYSICIAN:  Linna Hoff, M.D., who scrubbed and present for the entire procedure.  ASSISTANT SURGEON:  None.  ANESTHESIA:  General via LMA with supraclavicular block.  SURGICAL PROCEDURE: 1. Right wrist intra-articular distal radius fracture 3 or more intra-     articular fragments requiring __________ open reduction and     internal fixation. 2. Right wrist brachioradialis tendon release __________. 3. Right wrist closed treatment of distal ulna fracture without     internal fixation. 4. Radiographs 3 views, right wrist.  RADIOGRAPHIC INTERPRETATION:  AP and lateral oblique views of the wrist did show the volar plate fixation in place.  The patient does have a comminuted distal ulna fracture and did not feel was amenable to internal fixation.  SURGICAL IMPLANTS:  __________.  SURGICAL INDICATIONS:  Mrs. Pasquinelli is a right-hand dominant female, who sustained a closed injury to her right distal radius.  The patient was seen and evaluated in the office and recommended to undergo the above procedure.  Risks, benefits, and alternatives were discussed in detail with the patient.  Signed informed consent was obtained.  Risks include, but not limited to bleeding, infection, damage to nearby nerves, arteries, or tendons, loss of motion of the wrist and digits, incomplete relief of symptoms, and need for further surgical intervention.  DESCRIPTION OF PROCEDURE:  The  patient was properly identified in the preoperative holding area, marked with a permanent marker, made on the right wrist to indicate correct operative site.  Patient was then brought back to the operating room, placed supine on the anesthesia room table.  General anesthesia was administered.  The patient received preoperative antibiotics __________ skin incision.  A well-padded tourniquet placed on the right brachium, __________.  Right upper extremity was then prepped and draped in normal sterile fashion.  Time- out was called.  Correct site was identified, and procedure then begun. Attention then turned to the right wrist.  Once the incision made directly over the FCR sheath, dissection was carried down through the skin and subcutaneous tissue and the tourniquet insufflated.  The crossing vasculature was __________, cauterized with the bipolar cautery.  The dissection was carried down through the FCR sheath all the way down __________ pronator, stripping the __________exposing the pronator quadratus.  An L-shaped pronator quadratus flap was then elevated.  The fracture site was then exposed.  In order to reduce the radial column in the intra-articular fracture, 3 or more fragments of the brachioradialis was then carefully __________ released off the radial styloid.  This was necessary __________ radial column back out to length.  The wound was then thoroughly irrigated.  An open reduction  was then performed and then the intra-articular fracture and then once this was carried out, the volar plate was then applied and held distally with a K-wire, plate position was then confirmed.  Then adjusted with the oblong screw hole proximally after the appropriate drilling and depth gauge measurement of the oblong screw.  After adjustment on plate __________ distal fixation was carried out from an ulnar to radial direction, making sure not to penetrate into the articular margins. Radiographs were  then obtained with confirmation of the distal pegs and screws.  Following this, shaft fixation was then carried out with a combination of locking and nonlocking screws.  The wound was then thoroughly irrigated.  Final radiographs were then obtained.  The patient did have the distal ulna fracture.  It was felt to be a highly comminuted distal ulna fracture and it was not treated with internal fixation.  The patient did not have any gross instability with stress testing __________ ligament of the distal radioulnar joint after fixation of the radius.  The pronator quadratus then closed with 2-0 Vicryl.  Subcutaneous tissues closed with 4-0 Vicryl.  Skin was then closed with simple Prolene sutures.  Adaptic dressing, sterile compressive bandage then applied.  The patient was placed in a well- padded sugar-tong splint, extubated, and taken to recovery room in good condition.  POSTPROCEDURE PLAN:  The patient was then discharged to home, seen back in the office in approximately 2 weeks for wound check, suture removal, application of short-arm cast.  __________ order for this to see the therapist the 4-week mark.  Radiographs at each visit.     Melrose Nakayama, M.D.     FWO/MEDQ  D:  02/01/2016  T:  02/01/2016  Job:  [id number]

## 2016-02-04 ENCOUNTER — Encounter (HOSPITAL_COMMUNITY): Payer: Self-pay | Admitting: Orthopedic Surgery

## 2016-02-05 ENCOUNTER — Ambulatory Visit: Payer: Medicare Other | Admitting: Pediatrics

## 2016-02-06 ENCOUNTER — Other Ambulatory Visit (HOSPITAL_COMMUNITY): Payer: Medicare Other

## 2016-02-07 ENCOUNTER — Encounter (HOSPITAL_COMMUNITY): Payer: PPO | Attending: Hematology & Oncology

## 2016-02-07 DIAGNOSIS — D6851 Activated protein C resistance: Secondary | ICD-10-CM | POA: Diagnosis not present

## 2016-02-07 LAB — PROTIME-INR
INR: 1.34
PROTHROMBIN TIME: 16.7 s — AB (ref 11.4–15.2)

## 2016-02-10 ENCOUNTER — Encounter (HOSPITAL_COMMUNITY): Payer: PPO

## 2016-02-10 DIAGNOSIS — D6851 Activated protein C resistance: Secondary | ICD-10-CM

## 2016-02-10 LAB — PROTIME-INR
INR: 1.5
Prothrombin Time: 18.2 seconds — ABNORMAL HIGH (ref 11.4–15.2)

## 2016-02-12 ENCOUNTER — Other Ambulatory Visit (HOSPITAL_COMMUNITY): Payer: Medicare Other

## 2016-02-13 ENCOUNTER — Other Ambulatory Visit (HOSPITAL_COMMUNITY): Payer: Self-pay

## 2016-02-14 ENCOUNTER — Encounter (HOSPITAL_COMMUNITY): Payer: PPO

## 2016-02-14 ENCOUNTER — Other Ambulatory Visit (HOSPITAL_COMMUNITY): Payer: Self-pay

## 2016-02-14 DIAGNOSIS — S52531D Colles' fracture of right radius, subsequent encounter for closed fracture with routine healing: Secondary | ICD-10-CM | POA: Diagnosis not present

## 2016-02-14 DIAGNOSIS — D6851 Activated protein C resistance: Secondary | ICD-10-CM | POA: Diagnosis not present

## 2016-02-14 LAB — PROTIME-INR
INR: 2.18
PROTHROMBIN TIME: 24.6 s — AB (ref 11.4–15.2)

## 2016-02-20 ENCOUNTER — Other Ambulatory Visit (HOSPITAL_COMMUNITY): Payer: Self-pay | Admitting: Oncology

## 2016-02-20 DIAGNOSIS — D6851 Activated protein C resistance: Secondary | ICD-10-CM

## 2016-02-20 MED ORDER — WARFARIN SODIUM 2 MG PO TABS: 2.0000 mg | ORAL_TABLET | Freq: Every day | ORAL | 5 refills | Status: DC

## 2016-02-26 ENCOUNTER — Encounter (HOSPITAL_COMMUNITY): Payer: PPO

## 2016-02-26 ENCOUNTER — Other Ambulatory Visit (HOSPITAL_COMMUNITY): Payer: Self-pay | Admitting: Oncology

## 2016-02-26 DIAGNOSIS — D6851 Activated protein C resistance: Secondary | ICD-10-CM | POA: Diagnosis not present

## 2016-02-26 LAB — PROTIME-INR
INR: 3.08
Prothrombin Time: 32.4 seconds — ABNORMAL HIGH (ref 11.4–15.2)

## 2016-02-28 DIAGNOSIS — S52531D Colles' fracture of right radius, subsequent encounter for closed fracture with routine healing: Secondary | ICD-10-CM | POA: Diagnosis not present

## 2016-02-28 DIAGNOSIS — Z4789 Encounter for other orthopedic aftercare: Secondary | ICD-10-CM | POA: Diagnosis not present

## 2016-03-03 DIAGNOSIS — S52531D Colles' fracture of right radius, subsequent encounter for closed fracture with routine healing: Secondary | ICD-10-CM | POA: Diagnosis not present

## 2016-03-10 DIAGNOSIS — S52531D Colles' fracture of right radius, subsequent encounter for closed fracture with routine healing: Secondary | ICD-10-CM | POA: Diagnosis not present

## 2016-03-11 ENCOUNTER — Other Ambulatory Visit (HOSPITAL_COMMUNITY): Payer: Self-pay | Admitting: Emergency Medicine

## 2016-03-11 ENCOUNTER — Encounter (HOSPITAL_COMMUNITY): Payer: PPO | Attending: Oncology

## 2016-03-11 DIAGNOSIS — D6851 Activated protein C resistance: Secondary | ICD-10-CM | POA: Diagnosis not present

## 2016-03-11 LAB — PROTIME-INR
INR: 2.76
Prothrombin Time: 29.7 seconds — ABNORMAL HIGH (ref 11.4–15.2)

## 2016-03-13 DIAGNOSIS — S52531D Colles' fracture of right radius, subsequent encounter for closed fracture with routine healing: Secondary | ICD-10-CM | POA: Diagnosis not present

## 2016-03-20 DIAGNOSIS — S52531D Colles' fracture of right radius, subsequent encounter for closed fracture with routine healing: Secondary | ICD-10-CM | POA: Diagnosis not present

## 2016-03-24 DIAGNOSIS — S52531D Colles' fracture of right radius, subsequent encounter for closed fracture with routine healing: Secondary | ICD-10-CM | POA: Diagnosis not present

## 2016-03-25 ENCOUNTER — Other Ambulatory Visit (HOSPITAL_COMMUNITY): Payer: Self-pay | Admitting: Oncology

## 2016-03-25 DIAGNOSIS — D6851 Activated protein C resistance: Secondary | ICD-10-CM

## 2016-03-26 DIAGNOSIS — S52531D Colles' fracture of right radius, subsequent encounter for closed fracture with routine healing: Secondary | ICD-10-CM | POA: Diagnosis not present

## 2016-03-27 DIAGNOSIS — S52531D Colles' fracture of right radius, subsequent encounter for closed fracture with routine healing: Secondary | ICD-10-CM | POA: Diagnosis not present

## 2016-04-01 ENCOUNTER — Encounter (HOSPITAL_COMMUNITY): Payer: PPO | Attending: Oncology

## 2016-04-01 DIAGNOSIS — D6851 Activated protein C resistance: Secondary | ICD-10-CM | POA: Insufficient documentation

## 2016-04-01 LAB — PROTIME-INR
INR: 3.33
Prothrombin Time: 34.5 seconds — ABNORMAL HIGH (ref 11.4–15.2)

## 2016-04-21 ENCOUNTER — Other Ambulatory Visit (HOSPITAL_COMMUNITY): Payer: Self-pay | Admitting: *Deleted

## 2016-04-21 DIAGNOSIS — D6851 Activated protein C resistance: Secondary | ICD-10-CM

## 2016-04-22 ENCOUNTER — Encounter (HOSPITAL_COMMUNITY): Payer: PPO

## 2016-04-22 DIAGNOSIS — D6851 Activated protein C resistance: Secondary | ICD-10-CM | POA: Diagnosis not present

## 2016-04-22 LAB — PROTIME-INR
INR: 3.09
Prothrombin Time: 32.6 seconds — ABNORMAL HIGH (ref 11.4–15.2)

## 2016-05-01 DIAGNOSIS — I472 Ventricular tachycardia: Secondary | ICD-10-CM | POA: Diagnosis not present

## 2016-05-01 DIAGNOSIS — G8918 Other acute postprocedural pain: Secondary | ICD-10-CM | POA: Diagnosis not present

## 2016-05-01 DIAGNOSIS — I471 Supraventricular tachycardia: Secondary | ICD-10-CM | POA: Diagnosis not present

## 2016-05-01 DIAGNOSIS — S52531A Colles' fracture of right radius, initial encounter for closed fracture: Secondary | ICD-10-CM | POA: Diagnosis not present

## 2016-05-19 ENCOUNTER — Other Ambulatory Visit (HOSPITAL_COMMUNITY): Payer: Self-pay | Admitting: *Deleted

## 2016-05-19 DIAGNOSIS — C49A3 Gastrointestinal stromal tumor of small intestine: Secondary | ICD-10-CM

## 2016-05-20 ENCOUNTER — Other Ambulatory Visit (HOSPITAL_COMMUNITY): Payer: PPO

## 2016-05-26 ENCOUNTER — Other Ambulatory Visit (HOSPITAL_COMMUNITY): Payer: PPO

## 2016-05-27 ENCOUNTER — Encounter (HOSPITAL_COMMUNITY): Payer: PPO | Attending: Oncology

## 2016-05-27 ENCOUNTER — Other Ambulatory Visit (HOSPITAL_COMMUNITY): Payer: Self-pay

## 2016-05-27 DIAGNOSIS — Z7901 Long term (current) use of anticoagulants: Secondary | ICD-10-CM | POA: Diagnosis not present

## 2016-05-27 DIAGNOSIS — D6851 Activated protein C resistance: Secondary | ICD-10-CM | POA: Diagnosis not present

## 2016-05-27 LAB — PROTIME-INR
INR: 2.9
Prothrombin Time: 30.9 seconds — ABNORMAL HIGH (ref 11.4–15.2)

## 2016-06-23 ENCOUNTER — Other Ambulatory Visit: Payer: Self-pay | Admitting: Pediatrics

## 2016-06-23 DIAGNOSIS — I471 Supraventricular tachycardia: Secondary | ICD-10-CM

## 2016-06-24 ENCOUNTER — Other Ambulatory Visit (HOSPITAL_COMMUNITY): Payer: Self-pay

## 2016-06-24 ENCOUNTER — Encounter (HOSPITAL_COMMUNITY): Payer: PPO

## 2016-06-24 DIAGNOSIS — Z7901 Long term (current) use of anticoagulants: Secondary | ICD-10-CM

## 2016-06-24 DIAGNOSIS — D6851 Activated protein C resistance: Secondary | ICD-10-CM

## 2016-06-24 DIAGNOSIS — I82402 Acute embolism and thrombosis of unspecified deep veins of left lower extremity: Secondary | ICD-10-CM

## 2016-06-24 LAB — PROTIME-INR
INR: 3.21
Prothrombin Time: 33.5 seconds — ABNORMAL HIGH (ref 11.4–15.2)

## 2016-07-07 NOTE — Assessment & Plan Note (Addendum)
Factor V leiden heterozygosity with recurrent DVT.  On lifelong anticoagulation with Vitamin K antagonist.  Labs today: CBC diff, BMET, iron/TIBC, ferritin, PT/INR.  I personally reviewed and went over laboratory results with the patient.  The results are noted within this dictation.  INR today is 3.67.  I have asked patient to HOLD Coumadin tonight and restart tomorrow, 07/09/2016, at 6 mg.  She will then return to her 7/6 mg alternating.    INR recheck in 10-14 days.  Continue same dose of Coumadin.  Labs every 4 weeks: PT/INR.  Labs in 6 months: CBC diff, CMET, iron/TIBC, ferritin.  I personally reviewed and went over radiographic studies with the patient.  The results are noted within this dictation.  I personally reviewed the images in PACS.  She is due for mammogram in August 2018.  Order is placed for her annual mammogram.  We will assist in getting this scheduled for the patient.  She reports that her family member is having issues with her thyroid and therefore she would like her TSH checked with her next labs.  We can check her TSH with her next lab appt in 6 months.  We can send result to her PCP.  Return in 6 months for follow-up.

## 2016-07-07 NOTE — Progress Notes (Addendum)
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  Annual imaging surveillance.  INTERVAL HISTORY: [name] 66 y.o. female returns for followup of heterozygous for factor V leiden mutation, recurrent DVT, now on lifelong anticoagulation with Vitamin K antagonist therapy. AND GIST, Stage II.  S/P resection by Dr. Arnoldo Morale on 01/05/2011.     GIST (gastrointestinal stromal tumor), malignant ([place])   01/03/2011 Initial Diagnosis    GIST (gastrointestinal stromal tumor), malignant ([place])      01/03/2011 Imaging    CT abd/pelvis- 5.0 cm soft tissue mass associated with distal small bowel. Findings characteristic of gastrointestinal stromal tumor. Adenocarcinoma or malignant transformation are not excluded.  Stable left adrenal adenoma.      04/03/2011 Imaging    CT abd/pelvis- Small bowel obstruction at small bowel anastomosis. Single loop of small bowel in the left mid abdomen shows wall thickening, nonspecific; this can be seen with infection, inflammatory bowel disease and ischemia. Free intraperitoneal fluid. Small right pleural effusion and bibasilar atelectasis. Left adrenal adenoma.      01/25/2012 Imaging    CT abd/pelvis- Prior small bowel resection with anastomoses in the left mid abdomen.  No evidence of metastatic disease in the abdomen/pelvis.  No evidence of bowel obstruction.  Focal eccentric wall thickening along the lateral aspect of the cecum.  Colonoscopy is suggested to exclude a primary  colonic neoplasm.       01/25/2013 Imaging    CT abd/pelvis- 1. Thickening through the gastric cardiac region likely represents redundant folds of normal mucosa. Recommend attention on follow-up. 2. Mild haziness to the central mesentery is similar to and prior likely related to prior bowel surgery. No evidence of GIST recurrence within the small bowel. 3. No evidence of adenopathy or mass in the abdomen or pelvis. 4. Stable left adrenal adenoma. 5. 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      01/15/2014 Imaging    CT abd/pelvis- Stable exam. No evidence for recurrent disease in the abdomen or pelvis.  Stable left adrenal adenoma.  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      01/10/2015 Imaging    CT abd/pelvis- 1. Stable exam. No evidence for recurrent disease within the abdomen or pelvis. 2. Stable left adrenal gland adenoma. 3. 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      01/23/2015 Imaging    CT abd/pelvis- Small bowel obstruction with a transition zone just distal to the ileo ileal anastomosis in the anterior pelvis and secondary to peritoneal adhesions.        HPI Elements   Location: Left popliteal, peroneal, posterior tibial and femoral veins  Quality: Deep vein thrombosis.  Severity: Severe  Duration: Recurrence in 2013  Context: Occlusive  Timing:   Modifying Factors:   Associated Signs & Symptoms:     She reports appetite of 100%.  Weight is stable.  Her energy level is rated at 50%.  She denies any pain.  She reports compliance with her  anticoagulation.  She denies any diet changes.  She denies any missed doses.  She reports intermittent palpitations.  She is on metoprolol.  She is otherwise asymptomatic when these occur.  She denies any shortness of breath or chest pain when she experiences palpitations.  Review of Systems  Constitutional: Negative.   Negative for chills, fever and weight loss.  HENT: Negative.   Eyes: Negative.   Respiratory: Negative.  Negative for cough.   Cardiovascular: Positive for palpitations. Negative for chest pain.  Gastrointestinal: Negative.  Negative for blood in stool, constipation, diarrhea, melena, nausea and vomiting.  Genitourinary: Negative.   Musculoskeletal: Negative.   Skin: Negative.   Neurological: Negative.  Negative for weakness.  Endo/Heme/Allergies: Negative.   Psychiatric/Behavioral: Negative.     Past Medical History:  Diagnosis Date  . Allergic urticaria 01/04/2011   Rash from tape.  . Anxiety   . Cellulitis of left leg 2006  . Clotting disorder ([place])    heterozygosity from factor v leiden  . DVT (deep venous thrombosis) ([place]) 07/10/2010   on coumadin  . Dyspnea    with exertion  . Factor V Leiden ([place])   . GERD (gastroesophageal reflux disease)   . GIST (gastrointestinal stromal tumor), malignant ([place]) 01/03/2011   S/P resection on 01/05/11.  Intolerant to [employer]. Skin Cancer Left hand  . History of blood transfusion   . Hypertension   . Pernicious anemia 07/10/2010  . Small bowel mass 01/03/2011   s/p surgery  . Ulcer 05/2009   esophageal  . Ventricular tachycardia ([place]) 03/26/11  . Vitamin B12 deficiency    vit b12 1000 mcg monthly    Past Surgical History:  Procedure Laterality Date  . ABDOMINAL HYSTERECTOMY  1989  . BALLOON DILATION  12/11/2010   Procedure: BALLOON DILATION;  Surgeon: Rogene Houston, MD;  [facility];  Service: Endoscopy;  Laterality: N/A;  . BOWEL RESECTION  01/05/2011   Procedure: SMALL BOWEL RESECTION;  Surgeon: Jamesetta So;  Location: [facility] OR[facility];  Service: General;;  Partial Small Bowel Resection  . COLONOSCOPY  02/25/2012   Procedure: COLONOSCOPY;  Surgeon: Rogene Houston, MD;  [facility];  Service: Endoscopy;  Laterality: N/A;  1200  . GIVENS CAPSULE STUDY  01/02/2011   Procedure: GIVENS CAPSULE STUDY;  Surgeon: Rogene Houston, MD;  [facility];  Service: Endoscopy;  Laterality: N/A;  . LAPAROTOMY  01/05/2011   Procedure: EXPLORATORY LAPAROTOMY;  Surgeon: Jamesetta So;  Location: [facility];  Service: General;  Laterality: N/A;  . OPEN REDUCTION INTERNAL FIXATION (ORIF) SCAPHOID WITH DISTAL RADIUS GRAFT Right 02/01/2016   Procedure: Right distal radius open reduction and internal fixation and repair as indicated;  Surgeon: Iran Planas, MD;  Location: [facility];  Service: Orthopedics;  Laterality: Right;  Requests 90 mins    History reviewed. No pertinent family history.  Social History   Social History  . Marital status: Married    Spouse name: N/A  . Number of children: N/A  . Years of education: N/A   Social History Main Topics  . Smoking status: Never Smoker  . Smokeless tobacco: Never Used  . Alcohol use No  . Drug use: No  . Sexual activity: Not Currently   Other Topics Concern  . None   Social History Narrative  . None     PHYSICAL EXAMINATION  ECOG PERFORMANCE STATUS: 0 - Asymptomatic  Vitals:   07/08/16 0954  BP: (!) 146/80  Pulse: 65  Resp: 18  Temp: 97.8 F (36.6 C)    GENERAL:alert, no distress, well nourished, well developed, obese, smiling and unaccompanied SKIN: skin color, texture, turgor are normal, no rashes or significant lesions HEAD: Normocephalic, No masses, lesions, tenderness or abnormalities EYES: normal, EOMI, Conjunctiva are pink and non-injected EARS: External ears normal OROPHARYNX:no exudate, no erythema and lips, buccal mucosa, and tongue normal  NECK: supple, no adenopathy, trachea midline LYMPH:  no palpable lymphadenopathy BREAST:not examined LUNGS: clear to auscultation  HEART: regular rate & rhythm, no murmurs and no gallops ABDOMEN:abdomen soft, obese and normal bowel sounds BACK: Back symmetric, no curvature. EXTREMITIES:less then 2 second capillary refill, no joint deformities, effusion, or inflammation, no edema, no skin  discoloration, no cyanosis  NEURO: alert & oriented x 3 with fluent speech, no focal motor/sensory deficits, gait normal   LABORATORY DATA: CBC    Component Value Date/Time   WBC 5.8 07/08/2016 0918   RBC 4.81 07/08/2016 0918   HGB 13.6 07/08/2016 0918   HCT 42.5 07/08/2016 0918   PLT 260 07/08/2016 0918   MCV 88.4 07/08/2016 0918   MCH 28.3 07/08/2016 0918   MCHC 32.0 07/08/2016 0918   RDW 13.7 07/08/2016 0918   LYMPHSABS 2.0 07/08/2016 0918   MONOABS 0.6 07/08/2016 0918   EOSABS 0.1 07/08/2016 0918   BASOSABS 0.0 07/08/2016 0918      Chemistry      Component Value Date/Time   NA 139 07/08/2016 0918   K 3.9 07/08/2016 0918   CL 106 07/08/2016 0918   CO2 26 07/08/2016 0918   BUN 10 07/08/2016 0918   CREATININE 0.66 07/08/2016 0918      Component Value Date/Time   CALCIUM 8.9 07/08/2016 0918   ALKPHOS 77 01/23/2015 0025   AST 26 01/23/2015 0025   ALT 31 01/23/2015 0025   BILITOT 0.8 01/23/2015 0025     Lab Results  Component Value Date   INR 3.67 07/08/2016   INR 3.21 06/24/2016   INR 2.90 05/27/2016    PENDING LABS:   RADIOGRAPHIC STUDIES:  No results found.   PATHOLOGY:    ASSESSMENT AND PLAN:  Factor V Leiden ([place]) Factor V leiden heterozygosity with recurrent DVT.  On lifelong anticoagulation with Vitamin K antagonist.  Labs today: CBC diff, BMET, iron/TIBC, ferritin, PT/INR.  I personally reviewed and went over laboratory results with the patient.  The results are noted within this dictation.  INR today is 3.67.  I have asked patient to HOLD Coumadin tonight and restart tomorrow, 07/09/2016, at 6 mg.  She will then return to her 7/6 mg alternating.    INR recheck in 10-14 days.  Continue same dose of Coumadin.  Labs every 4 weeks: PT/INR.  Labs in 6 months: CBC diff, CMET, iron/TIBC, ferritin.  I personally reviewed and went over radiographic studies with the patient.  The results are noted within this dictation.  I personally reviewed the  images in PACS.  She is due for mammogram in August 2018.  Order is placed for her annual mammogram.  We will assist in getting this scheduled for the patient.  She reports that her family member is having issues with her thyroid and therefore she would like her TSH checked with her next labs.  We can check her TSH with her next lab appt in 6 months.  We can send result to her PCP.  Return in 6 months for follow-up.  GIST (gastrointestinal stromal tumor), malignant ([place]) Stage II GIST, S/P resection by  Dr. Arnoldo Morale on 01/05/2011.  She was started on adjuvant imatinib, but was complicated by cardiac dysrhythmia, therefore resulting in discontinuation of therapy as this was considered an adverse event.  CT abd/pelvis in 6 months.  Order is placed.  This will be performed in December 2018.  Annual CT imaging for surveillance is recommended and has been negative thus far.   ORDERS PLACED FOR THIS ENCOUNTER: Orders Placed This Encounter  Procedures  . MM SCREENING BREAST TOMO BILATERAL  . CBC with Differential  . Comprehensive metabolic panel  . Iron and TIBC  . Ferritin  . TSH    MEDICATIONS PRESCRIBED THIS ENCOUNTER: No orders of the defined types were placed in this encounter.   THERAPY PLAN:  Continue with anticoagulation with INR checks.  We will continue to monitor for iron deficiency.  For her GIST, annual imaging is reasonable.  All questions were answered. The patient knows to call the clinic with any problems, questions or concerns. We can certainly see the patient much sooner if necessary.  Patient and plan discussed with Dr. Twana First and she is in agreement with the aforementioned.   This note is electronically signed by: Doy Mince 07/08/2016 3:23 PM

## 2016-07-07 NOTE — Assessment & Plan Note (Signed)
Stage II GIST, S/P resection by Dr. Arnoldo Morale on 01/05/2011.  She was started on adjuvant imatinib, but was complicated by cardiac dysrhythmia, therefore resulting in discontinuation of therapy as this was considered an adverse event.  CT abd/pelvis in 6 months.  Order is placed.  This will be performed in December 2018.  Annual CT imaging for surveillance is recommended and has been negative thus far.

## 2016-07-08 ENCOUNTER — Encounter (HOSPITAL_COMMUNITY): Payer: PPO | Attending: Oncology

## 2016-07-08 ENCOUNTER — Encounter (HOSPITAL_BASED_OUTPATIENT_CLINIC_OR_DEPARTMENT_OTHER): Payer: PPO | Admitting: Oncology

## 2016-07-08 ENCOUNTER — Other Ambulatory Visit (HOSPITAL_COMMUNITY): Payer: Self-pay | Admitting: Oncology

## 2016-07-08 ENCOUNTER — Encounter (HOSPITAL_COMMUNITY): Payer: Self-pay | Admitting: Oncology

## 2016-07-08 VITALS — BP 146/80 | HR 65 | Temp 97.8°F | Resp 18 | Wt 250.2 lb

## 2016-07-08 DIAGNOSIS — C49A3 Gastrointestinal stromal tumor of small intestine: Secondary | ICD-10-CM

## 2016-07-08 DIAGNOSIS — D6851 Activated protein C resistance: Secondary | ICD-10-CM | POA: Insufficient documentation

## 2016-07-08 DIAGNOSIS — D509 Iron deficiency anemia, unspecified: Secondary | ICD-10-CM

## 2016-07-08 DIAGNOSIS — Z86718 Personal history of other venous thrombosis and embolism: Secondary | ICD-10-CM | POA: Diagnosis not present

## 2016-07-08 DIAGNOSIS — I82402 Acute embolism and thrombosis of unspecified deep veins of left lower extremity: Secondary | ICD-10-CM

## 2016-07-08 DIAGNOSIS — Z0000 Encounter for general adult medical examination without abnormal findings: Secondary | ICD-10-CM

## 2016-07-08 DIAGNOSIS — Z7901 Long term (current) use of anticoagulants: Secondary | ICD-10-CM | POA: Diagnosis not present

## 2016-07-08 LAB — BASIC METABOLIC PANEL
ANION GAP: 7 (ref 5–15)
BUN: 10 mg/dL (ref 6–20)
CALCIUM: 8.9 mg/dL (ref 8.9–10.3)
CO2: 26 mmol/L (ref 22–32)
Chloride: 106 mmol/L (ref 101–111)
Creatinine, Ser: 0.66 mg/dL (ref 0.44–1.00)
GFR calc Af Amer: >60 mL/min (ref >60)
Glucose, Bld: 97 mg/dL (ref 65–99)
Potassium: 3.9 mmol/L (ref 3.5–5.1)
SODIUM: 139 mmol/L (ref 135–145)

## 2016-07-08 LAB — CBC WITH DIFFERENTIAL/PLATELET
BASOS ABS: 0 10*3/uL (ref 0.0–0.1)
BASOS PCT: 0 %
EOS ABS: 0.1 10*3/uL (ref 0.0–0.7)
Eosinophils Relative: 2 %
HEMATOCRIT: 42.5 % (ref 36.0–46.0)
Hemoglobin: 13.6 g/dL (ref 12.0–15.0)
Lymphocytes Relative: 34 %
Lymphs Abs: 2 10*3/uL (ref 0.7–4.0)
MCH: 28.3 pg (ref 26.0–34.0)
MCHC: 32 g/dL (ref 30.0–36.0)
MCV: 88.4 fL (ref 78.0–100.0)
MONO ABS: 0.6 10*3/uL (ref 0.1–1.0)
Monocytes Relative: 11 %
NEUTROS ABS: 3.1 10*3/uL (ref 1.7–7.7)
Neutrophils Relative %: 53 %
Platelets: 260 10*3/uL (ref 150–400)
RBC: 4.81 MIL/uL (ref 3.87–5.11)
RDW: 13.7 % (ref 11.5–15.5)
WBC: 5.8 10*3/uL (ref 4.0–10.5)

## 2016-07-08 LAB — IRON AND TIBC
Iron: 71 ug/dL (ref 28–170)
Saturation Ratios: 24 % (ref 10.4–31.8)
TIBC: 290 ug/dL (ref 250–450)
UIBC: 219 ug/dL

## 2016-07-08 LAB — FERRITIN: FERRITIN: 51 ng/mL (ref 11–307)

## 2016-07-08 LAB — PROTIME-INR
INR: 3.67
Prothrombin Time: 37.3 seconds — ABNORMAL HIGH (ref 11.4–15.2)

## 2016-07-08 NOTE — Addendum Note (Signed)
Addended by: Baird Cancer on: 07/08/2016 03:23 PM   Modules accepted: Orders

## 2016-07-08 NOTE — Patient Instructions (Addendum)
[place] at [facility] Discharge Instructions  RECOMMENDATIONS MADE BY THE CONSULTANT AND ANY TEST RESULTS WILL BE SENT TO YOUR REFERRING PHYSICIAN.  You saw Kirby Crigler, PA-C, today. Labs today Mammogram in August CT abd/pelvis in December INR every 4 weeks Hold Coumadin tonight then 6 mg tomorrow evening, then alternating 7mg /6mg  INR in 10-14 See Amy at checkout for appointments.  Thank you for choosing [place] at [facility] to provide your oncology and hematology care.  To afford each patient quality time with our provider, please arrive at least 15 minutes before your scheduled appointment time.    If you have a lab appointment with the [facility] please come in thru the  Main Entrance and check in at the main information desk  You need to re-schedule your appointment should you arrive 10 or more minutes late.  We strive to give you quality time with our providers, and arriving late affects you and other patients whose appointments are after yours.  Also, if you no show three or more times for appointments you may be dismissed from the clinic at the providers discretion.     Again, thank you for choosing [facility].  Our hope is that these requests will decrease the amount of time that you wait before being seen by our physicians.       _____________________________________________________________  Should you have questions after your visit to [facility], please contact our office at [phone] between the hours of 8:30 a.m. and 4:30 p.m.  Voicemails left after 4:30 p.m. will not be returned until the following business day.  For prescription refill requests, have your pharmacy contact our office.       Resources For Cancer Patients and their Caregivers ? American Cancer Society: Can assist with transportation, wigs, general needs, runs Look Good Feel Better.        330-030-5725 ? Cancer  Care: Provides financial assistance, online support groups, medication/co-pay assistance.  1-800-813-HOPE 817-830-4453) ? [place] Assists [place] cancer patients and their families through emotional , educational and financial support.  314-085-3667 ? [place] DSS Where to apply for food stamps, Medicaid and utility assistance. 214-483-7990 ? [facility]: Transportation to medical appointments. (712)448-0055 ? Social Security Administration: May apply for disability if have a Stage IV cancer. 818-719-7506 (289)005-3050 ? [employer], Disability and Transit Services: Assists with nutrition, care and transit needs. [place] Support Programs: @10RELATIVEDAYS @ > Cancer Support Group  2nd Tuesday of the month 1pm-2pm, Journey Room  > Creative Journey  3rd Tuesday of the month 1130am-1pm, Journey Room  > Look Good Feel Better  1st Wednesday of the month 10am-12 noon, Journey Room (Call [place] to register [phone])

## 2016-07-15 ENCOUNTER — Other Ambulatory Visit (HOSPITAL_COMMUNITY): Payer: Medicare Other

## 2016-07-15 ENCOUNTER — Ambulatory Visit (HOSPITAL_COMMUNITY): Payer: Self-pay | Admitting: Oncology

## 2016-07-15 ENCOUNTER — Other Ambulatory Visit (HOSPITAL_COMMUNITY): Payer: PPO

## 2016-07-15 ENCOUNTER — Ambulatory Visit (HOSPITAL_COMMUNITY): Payer: Medicare Other | Admitting: Oncology

## 2016-07-20 ENCOUNTER — Encounter (HOSPITAL_COMMUNITY): Payer: PPO

## 2016-07-20 DIAGNOSIS — I82402 Acute embolism and thrombosis of unspecified deep veins of left lower extremity: Secondary | ICD-10-CM

## 2016-07-20 DIAGNOSIS — D6851 Activated protein C resistance: Secondary | ICD-10-CM | POA: Diagnosis not present

## 2016-07-20 LAB — PROTIME-INR
INR: 3.17
Prothrombin Time: 33.3 seconds — ABNORMAL HIGH (ref 11.4–15.2)

## 2016-08-11 ENCOUNTER — Encounter (HOSPITAL_COMMUNITY): Payer: PPO | Attending: Oncology

## 2016-08-11 DIAGNOSIS — I82402 Acute embolism and thrombosis of unspecified deep veins of left lower extremity: Secondary | ICD-10-CM | POA: Insufficient documentation

## 2016-08-11 LAB — PROTIME-INR
INR: 3.64
PROTHROMBIN TIME: 37.1 s — AB (ref 11.4–15.2)

## 2016-08-17 DIAGNOSIS — H40033 Anatomical narrow angle, bilateral: Secondary | ICD-10-CM | POA: Diagnosis not present

## 2016-08-17 DIAGNOSIS — H04123 Dry eye syndrome of bilateral lacrimal glands: Secondary | ICD-10-CM | POA: Diagnosis not present

## 2016-08-25 ENCOUNTER — Encounter (HOSPITAL_COMMUNITY): Payer: Self-pay | Admitting: Oncology

## 2016-08-25 ENCOUNTER — Other Ambulatory Visit (HOSPITAL_COMMUNITY): Payer: Self-pay | Admitting: Oncology

## 2016-08-25 DIAGNOSIS — D6851 Activated protein C resistance: Secondary | ICD-10-CM

## 2016-08-25 MED ORDER — RIVAROXABAN (XARELTO) VTE STARTER PACK (15 & 20 MG): ORAL_TABLET | ORAL | 0 refills | Status: DC

## 2016-08-25 NOTE — Progress Notes (Signed)
Patient is interested in switching anticoagulation from vitamin K antagonist therapy to NOAC.  We'll prescribe Xarelto starting pack at 15 mg twice a day 21 days followed by 20 mg daily thereafter. She will take her Coumadin dose tonight and then start this transition tomorrow morning.  She will take Xarelto with food.  We will cancel her upcoming lab appointment with INR check.  Return for follow-up as scheduled.  Johnwilliam Shepperson, PA-C 08/25/2016 11:55 AM

## 2016-09-01 ENCOUNTER — Other Ambulatory Visit (HOSPITAL_COMMUNITY): Payer: PPO

## 2016-09-16 ENCOUNTER — Other Ambulatory Visit (HOSPITAL_COMMUNITY): Payer: Self-pay | Admitting: Adult Health

## 2016-09-16 DIAGNOSIS — D6851 Activated protein C resistance: Secondary | ICD-10-CM

## 2016-09-16 DIAGNOSIS — Z7901 Long term (current) use of anticoagulants: Secondary | ICD-10-CM

## 2016-09-16 MED ORDER — RIVAROXABAN 20 MG PO TABS: 20.0000 mg | ORAL_TABLET | Freq: Every day | ORAL | 3 refills | Status: DC

## 2016-09-17 ENCOUNTER — Ambulatory Visit (HOSPITAL_COMMUNITY): Payer: PPO

## 2016-10-05 ENCOUNTER — Ambulatory Visit (HOSPITAL_COMMUNITY)
Admission: RE | Admit: 2016-10-05 | Discharge: 2016-10-05 | Disposition: A | Discharge status: Home / Self Care | Payer: PPO | Source: Ambulatory Visit | Attending: Oncology | Admitting: Oncology

## 2016-10-05 ENCOUNTER — Other Ambulatory Visit (HOSPITAL_COMMUNITY): Payer: Self-pay | Admitting: Oncology

## 2016-10-05 ENCOUNTER — Ambulatory Visit (HOSPITAL_COMMUNITY): Payer: PPO

## 2016-10-05 DIAGNOSIS — Z1231 Encounter for screening mammogram for malignant neoplasm of breast: Secondary | ICD-10-CM | POA: Insufficient documentation

## 2016-10-13 NOTE — Progress Notes (Signed)
So what does she want to do? Go back to coumadin or stay on xarelto?

## 2016-10-13 NOTE — Progress Notes (Signed)
If she wants to go back to coumadin, send in Rx for coumadin 5 mg PO daily #30 pills with 1 refill and lovenox 100mg  [place] BID for 7 days. Make sure she knows how to give herself lovenox injections. Have her come in for INR check in 7 days. Counsel her on maintaining a diet low in Vitamin K.

## 2016-10-14 ENCOUNTER — Other Ambulatory Visit (HOSPITAL_COMMUNITY): Payer: Self-pay

## 2016-10-14 ENCOUNTER — Ambulatory Visit (INDEPENDENT_AMBULATORY_CARE_PROVIDER_SITE_OTHER): Payer: PPO | Admitting: Orthopaedic Surgery

## 2016-10-14 ENCOUNTER — Encounter (INDEPENDENT_AMBULATORY_CARE_PROVIDER_SITE_OTHER): Payer: Self-pay | Admitting: Orthopaedic Surgery

## 2016-10-14 ENCOUNTER — Ambulatory Visit (INDEPENDENT_AMBULATORY_CARE_PROVIDER_SITE_OTHER): Payer: PPO

## 2016-10-14 VITALS — BP 142/80 | HR 73 | Resp 16 | Ht 64.0 in | Wt 260.0 lb

## 2016-10-14 DIAGNOSIS — M1712 Unilateral primary osteoarthritis, left knee: Secondary | ICD-10-CM

## 2016-10-14 DIAGNOSIS — D6851 Activated protein C resistance: Secondary | ICD-10-CM

## 2016-10-14 DIAGNOSIS — I82402 Acute embolism and thrombosis of unspecified deep veins of left lower extremity: Secondary | ICD-10-CM

## 2016-10-14 MED ORDER — BUPIVACAINE HCL 0.5 % IJ SOLN
3.0000 mL | INTRAMUSCULAR | Status: AC | PRN
  Administered 2016-10-14: 3 mL via INTRA_ARTICULAR

## 2016-10-14 MED ORDER — WARFARIN SODIUM 5 MG PO TABS: 5.0000 mg | ORAL_TABLET | Freq: Every day | ORAL | 1 refills | Status: DC

## 2016-10-14 MED ORDER — LIDOCAINE HCL 1 % IJ SOLN
5.0000 mL | INTRAMUSCULAR | Status: AC | PRN
  Administered 2016-10-14: 5 mL

## 2016-10-14 MED ORDER — ENOXAPARIN SODIUM 100 MG/ML ~~LOC~~ SOLN: 100.0000 mg | SUBCUTANEOUS | 0 refills | Status: DC

## 2016-10-14 MED ORDER — METHYLPREDNISOLONE ACETATE 40 MG/ML IJ SUSP
80.0000 mg | INTRAMUSCULAR | Status: AC | PRN
  Administered 2016-10-14: 80 mg

## 2016-10-14 NOTE — Progress Notes (Signed)
Office Visit Note   Patient: [name]           Date of Birth: November 08, 1950           MRN: [id number] Visit Date: 10/14/2016              Requested by: Denise Maize, MD [place], [place] [address] PCP: Denise Maize, MD   Assessment & Plan: Visit Diagnoses:  1. Unilateral primary osteoarthritis, left knee     Plan: long discussion regarding diagnosis of primary osteoarthritis. Discussed at length different treatment options. We'll plan  to perform a cortisone injectioneft knee and monitor her response. Might be a candidate for Visco supplementation. Also discussed total knee replacement but but not suggest that at this time. Patient has a Leiden factor V deficiency and is taking xarelto..changes in the medial compartment or progressive from her films of several years ago and consistent with advanced osteoarthritis  Follow-Up Instructions: Return if symptoms worsen or fail to improve.   Orders:  Orders Placed This Encounter  Procedures  . XR KNEE 3 VIEW LEFT   No orders of the defined types were placed in this encounter.     Procedures: Large Joint Inj Date/Time: 10/14/2016 3:26 PM Performed by: Garald Balding Authorized by: Garald Balding   Consent Given by:  Patient Timeout: prior to procedure the correct patient, procedure, and site was verified   Indications:  Pain and joint swelling Location:  Knee Site:  L knee Prep: patient was prepped and draped in usual sterile fashion   Needle Size:  25 G Needle Length:  1.5 inches Approach:  Anteromedial Ultrasound Guidance: No   Fluoroscopic Guidance: No   Arthrogram: No   Medications:  5 mL lidocaine 1 %; 80 mg methylPREDNISolone acetate 40 MG/ML; 3 mL bupivacaine 0.5 % Aspiration Attempted: No   Patient tolerance:  Patient tolerated the procedure well with no immediate complications     Clinical Data: No additional findings.   Subjective: Chief Complaint  Patient presents with  .  Left Knee - Pain  [name] is accompanied by her husband and here for evaluation of left knee pain. She's been having trouble with her left knee for "many years". Films of her knee in 2016  performed elsewhere demonstrated very mild osteoarthritis. She was evaluated at [facility] and had an MRI scan performed in June 2017. She had evidence of an osteochondral lesion in the weightbearing surface of the medial femoral condyle suggesting instability. She also had tears of both the medial and lateral menisci. She's had cortisone injections with good temporary relief. She presently is having moe pain to the point of compromise and is somewhat confused about her diagnosis. She is on xarelto for a factor V deficiency.she does take Tylenol which provides some relief. She also has a pullover knee support which "helps". Pain is localized on the medial compartment of her knee and sometimes associated with an effusion.no experience pain in her lumbar spine thigh or groin. She denies fever or chills shortness of breath or chest pain.  HPI  Review of Systems  Constitutional: Positive for fatigue. Negative for chills and fever.  Eyes: Negative for itching.  Respiratory: Negative for chest tightness and shortness of breath.   Cardiovascular: Positive for leg swelling. Negative for chest pain and palpitations.  Gastrointestinal: Negative for blood in stool, constipation and diarrhea.  Endocrine: Negative for polyuria.  Genitourinary: Negative for dysuria.  Musculoskeletal: Positive for neck  stiffness. Negative for back pain, joint swelling and neck pain.  Allergic/Immunologic: Negative for immunocompromised state.  Neurological: Positive for numbness. Negative for dizziness.  Hematological: Does not bruise/bleed easily.  Psychiatric/Behavioral: The patient is not nervous/anxious.      Objective: Vital Signs: BP (!) 142/80   Pulse 73   Resp 16   Ht 5\' 4"  (1.626 m)   Wt 260 lb (117.9 kg)   BMI  44.63 kg/m   Physical Exam  Ortho Examleft knee without effusion. Diffuse mild to moderate medial joint pain. Mild patella crepitation. No instability. No calf pain. Some varicosities. Venous stasis changes distal third of leg and ankle without swelling. Neurovascular exam intact. Straight leg raise negative bilaterally. Painless range of motion both hips. Full extension left knee with approximately 105 of flexion No popliteal pain  Specialty Comments:  No specialty comments available.  Imaging: Xr Knee 3 View Left  Result Date: 10/14/2016 Films of the left knee were obtained in 3 projections standing. There is considerable deformity in the medial compartment with evidence of advanced osteoarthritis. There are peripheral osteophytes and subchondral sclerosis of the medial femoral condyle. There is considerable scalloping of the femoral condyle with probable osteochondral lesion. Approximately 1-2 of varus. Patellofemoral arthritis is present but less advanced. Medial joint space is considerably narrowed.    PMFS History: Patient Active Problem List   Diagnosis Date Noted  . Small bowel obstruction ([place]) 01/23/2015  . SBO (small bowel obstruction) ([place]) 01/23/2015  . Depression 11/19/2014  . Abnormal CT scan, colon 09/21/2012  . Bradycardia 04/19/2011  . Hypotension, iatrogenic 03/26/2011  . Ventricular tachycardia ([place]) 03/26/2011  . SVT (supraventricular tachycardia) ([place]) 03/25/2011  . Allergic urticaria 01/04/2011  . Lip swelling 01/03/2011  . GIST (gastrointestinal stromal tumor), malignant ([place]) 01/03/2011  . Syncope 01/01/2011  . Acute blood loss anemia 01/01/2011  . Chronic ulcer of left leg ([place]) 01/01/2011  . Chronic anticoagulation 01/01/2011  . Abnormal CXR 01/01/2011  . Factor V Leiden ([place])   . DVT (deep venous thrombosis) ([place]) 07/10/2010  . Pernicious anemia 07/10/2010  . CONTUSION, ARM 02/25/2010  . Iron deficiency anemia 08/27/2009  . WEIGHT GAIN 08/27/2009   . Reflux esophagitis 07/05/2009  . GI BLEEDING 06/11/2009   Past Medical History:  Diagnosis Date  . Allergic urticaria 01/04/2011   Rash from tape.  . Anxiety   . Cellulitis of left leg 2006  . Clotting disorder ([place])    heterozygosity from factor v leiden  . DVT (deep venous thrombosis) ([place]) 07/10/2010   on coumadin  . Dyspnea    with exertion  . Factor V Leiden ([place])   . GERD (gastroesophageal reflux disease)   . GIST (gastrointestinal stromal tumor), malignant ([place]) 01/03/2011   S/P resection on 01/05/11.  Intolerant to [employer]. Skin Cancer Left hand  . History of blood transfusion   . Hypertension   . Pernicious anemia 07/10/2010  . Small bowel mass 01/03/2011   s/p surgery  . Ulcer 05/2009   esophageal  . Ventricular tachycardia ([place]) 03/26/11  . Vitamin B12 deficiency    vit b12 1000 mcg monthly    History reviewed. No pertinent family history.  Past Surgical History:  Procedure Laterality Date  . ABDOMINAL HYSTERECTOMY  1989  . BALLOON DILATION  12/11/2010   Procedure: BALLOON DILATION;  Surgeon: Rogene Houston, MD;  [facility];  Service: Endoscopy;  Laterality: N/A;  . BOWEL RESECTION  01/05/2011   Procedure: SMALL BOWEL RESECTION;  Surgeon: Jeannette How  Arnoldo Morale;  Location: [facility];  Service: General;;  Partial Small Bowel Resection  . COLONOSCOPY  02/25/2012   Procedure: COLONOSCOPY;  Surgeon: Rogene Houston, MD;  [facility];  Service: Endoscopy;  Laterality: N/A;  1200  . GIVENS CAPSULE STUDY  01/02/2011   Procedure: GIVENS CAPSULE STUDY;  Surgeon: Rogene Houston, MD;  [facility];  Service: Endoscopy;  Laterality: N/A;  . LAPAROTOMY  01/05/2011   Procedure: EXPLORATORY LAPAROTOMY;  Surgeon: Jamesetta So;  Location: [facility];  Service: General;  Laterality: N/A;  . OPEN REDUCTION INTERNAL FIXATION (ORIF) SCAPHOID WITH DISTAL RADIUS GRAFT Right 02/01/2016   Procedure: Right distal radius open reduction and internal fixation and repair  as indicated;  Surgeon: Iran Planas, MD;  Location: [facility];  Service: Orthopedics;  Laterality: Right;  Requests 90 mins   Social History   Occupational History  . Not on file.   Social History Main Topics  . Smoking status: Never Smoker  . Smokeless tobacco: Never Used  . Alcohol use No  . Drug use: No  . Sexual activity: Not Currently

## 2016-10-16 ENCOUNTER — Telehealth (HOSPITAL_COMMUNITY): Payer: Self-pay

## 2016-10-16 NOTE — Telephone Encounter (Signed)
On 9/17 patient was called to give her the results of her mammogram. (see mammogram result note) While talking with her she stated she was switched from Warfarin to Xarelto by Gershon Mussel. Since she switched she has not felt good. Patient states she likes being on Xarelto because she doesn't have to come for labs but she doesn't like the fatigue, nagging headaches, and itching. All these symptoms started after starting Xarelto. She denies any other changes or other medications started. Reviewed with MD how to switch her back to coumadin. On 9/18 MD sent this message: "If she wants to go back to coumadin, send in Rx for coumadin 5 mg PO daily #30 pills with 1 refill and lovenox 100mg  Belva BID for 7 days. Make sure she knows how to give herself lovenox injections. Have her come in for INR check in 7 days. Counsel her on maintaining a diet low in Vitamin K." Attempted to call patient was unable to reach her but left message. Prescriptions sent to pharmacy. Called patient to let her know and to explain what to do the first week. Requested she call cancer center back so I could review low vitamin K diet and how to give herself the lovenox injections. Lab appt made for 1 week with diet and injection handouts sent to pt via her daughter in law, Amy.  On 9/20 received this message from her Daughter in law, Amy,: "I gave the patient the info about coumadin , she stated she is going to give the xarelto a few more weeks, she has thought about it more. She said if she is going to switch she will give [place] a call and let us know. She wanted really do the xarelto if she can. She thanked you all for your help." On 9/21 made sure Amy notified the patient to let us know what her final decision is.... Coumadin or Xarelto? With understanding verbalized.

## 2016-10-22 ENCOUNTER — Other Ambulatory Visit (HOSPITAL_COMMUNITY): Payer: PPO

## 2016-11-30 ENCOUNTER — Encounter (HOSPITAL_COMMUNITY): Payer: Self-pay | Admitting: Emergency Medicine

## 2016-11-30 ENCOUNTER — Other Ambulatory Visit (HOSPITAL_COMMUNITY): Payer: Self-pay | Admitting: Pharmacist

## 2016-11-30 ENCOUNTER — Encounter (HOSPITAL_COMMUNITY): Payer: PPO | Attending: Oncology

## 2016-11-30 NOTE — Progress Notes (Signed)
Pt called with place that she thinks is fixing to "ulcerate".  Pt has had this happen before in the past.  It is on the left ankle, same leg that the blood clots were in.  Area is red and warm but not tender or swollen.  Had pt come in [place] RN to look at the place.  Dr Talbert Cage in to look at the area.  We consulting with the pharmacists  to discuss possible antibiotics since she is allergy to so many antibiotics.  We as the [place] want her to see a specialist and see her PCP in the meantime.  We referred the pt to vein specialists in [place].  Pt verbalized understanding.

## 2016-12-04 ENCOUNTER — Other Ambulatory Visit: Payer: Self-pay

## 2016-12-04 DIAGNOSIS — I872 Venous insufficiency (chronic) (peripheral): Secondary | ICD-10-CM

## 2016-12-16 ENCOUNTER — Other Ambulatory Visit (HOSPITAL_COMMUNITY): Payer: Self-pay | Admitting: Oncology

## 2016-12-16 DIAGNOSIS — D510 Vitamin B12 deficiency anemia due to intrinsic factor deficiency: Secondary | ICD-10-CM

## 2016-12-16 NOTE — Telephone Encounter (Signed)
Patient called for refill on B12 injection. Reviewed with provider, chart checked and reordered.

## 2017-01-04 ENCOUNTER — Other Ambulatory Visit (HOSPITAL_COMMUNITY): Payer: PPO

## 2017-01-04 ENCOUNTER — Ambulatory Visit (HOSPITAL_COMMUNITY): Payer: PPO

## 2017-01-06 ENCOUNTER — Encounter (HOSPITAL_COMMUNITY): Payer: PPO

## 2017-01-06 ENCOUNTER — Encounter: Payer: PPO | Admitting: Vascular Surgery

## 2017-01-11 ENCOUNTER — Ambulatory Visit (HOSPITAL_COMMUNITY): Payer: PPO

## 2017-01-11 ENCOUNTER — Other Ambulatory Visit (HOSPITAL_COMMUNITY): Payer: PPO

## 2017-01-12 ENCOUNTER — Ambulatory Visit (HOSPITAL_COMMUNITY)
Admission: RE | Admit: 2017-01-12 | Discharge: 2017-01-12 | Disposition: A | Discharge status: Home / Self Care | Payer: PPO | Source: Ambulatory Visit | Attending: Oncology | Admitting: Oncology

## 2017-01-12 ENCOUNTER — Encounter (HOSPITAL_COMMUNITY): Payer: PPO | Attending: Oncology

## 2017-01-12 DIAGNOSIS — C49A3 Gastrointestinal stromal tumor of small intestine: Secondary | ICD-10-CM | POA: Diagnosis not present

## 2017-01-12 DIAGNOSIS — I878 Other specified disorders of veins: Secondary | ICD-10-CM | POA: Insufficient documentation

## 2017-01-12 DIAGNOSIS — D509 Iron deficiency anemia, unspecified: Secondary | ICD-10-CM | POA: Diagnosis not present

## 2017-01-12 DIAGNOSIS — Z7901 Long term (current) use of anticoagulants: Secondary | ICD-10-CM | POA: Diagnosis not present

## 2017-01-12 DIAGNOSIS — I745 Embolism and thrombosis of iliac artery: Secondary | ICD-10-CM | POA: Insufficient documentation

## 2017-01-12 DIAGNOSIS — K449 Diaphragmatic hernia without obstruction or gangrene: Secondary | ICD-10-CM | POA: Diagnosis not present

## 2017-01-12 DIAGNOSIS — D6851 Activated protein C resistance: Secondary | ICD-10-CM | POA: Insufficient documentation

## 2017-01-12 LAB — CBC WITH DIFFERENTIAL/PLATELET
Basophils Absolute: 0 10*3/uL (ref 0.0–0.1)
Basophils Relative: 0 %
EOS ABS: 0.1 10*3/uL (ref 0.0–0.7)
Eosinophils Relative: 2 %
HCT: 45.3 % (ref 36.0–46.0)
HEMOGLOBIN: 14.3 g/dL (ref 12.0–15.0)
LYMPHS ABS: 2.1 10*3/uL (ref 0.7–4.0)
Lymphocytes Relative: 37 %
MCH: 28.6 pg (ref 26.0–34.0)
MCHC: 31.6 g/dL (ref 30.0–36.0)
MCV: 90.6 fL (ref 78.0–100.0)
MONOS PCT: 11 %
Monocytes Absolute: 0.6 10*3/uL (ref 0.1–1.0)
NEUTROS PCT: 50 %
Neutro Abs: 2.8 10*3/uL (ref 1.7–7.7)
Platelets: 259 10*3/uL (ref 150–400)
RBC: 5 MIL/uL (ref 3.87–5.11)
RDW: 13.6 % (ref 11.5–15.5)
WBC: 5.6 10*3/uL (ref 4.0–10.5)

## 2017-01-12 LAB — COMPREHENSIVE METABOLIC PANEL
ALK PHOS: 96 U/L (ref 38–126)
ALT: 46 U/L (ref 14–54)
ANION GAP: 10 (ref 5–15)
AST: 38 U/L (ref 15–41)
Albumin: 4.1 g/dL (ref 3.5–5.0)
BILIRUBIN TOTAL: 0.5 mg/dL (ref 0.3–1.2)
BUN: 11 mg/dL (ref 6–20)
CALCIUM: 9.7 mg/dL (ref 8.9–10.3)
CO2: 24 mmol/L (ref 22–32)
Chloride: 106 mmol/L (ref 101–111)
Creatinine, Ser: 0.74 mg/dL (ref 0.44–1.00)
Glucose, Bld: 105 mg/dL — ABNORMAL HIGH (ref 65–99)
Potassium: 4.1 mmol/L (ref 3.5–5.1)
SODIUM: 140 mmol/L (ref 135–145)
TOTAL PROTEIN: 8.2 g/dL — AB (ref 6.5–8.1)

## 2017-01-12 LAB — FERRITIN: Ferritin: 70 ng/mL (ref 11–307)

## 2017-01-12 LAB — IRON AND TIBC
IRON: 61 ug/dL (ref 28–170)
SATURATION RATIOS: 19 % (ref 10.4–31.8)
TIBC: 321 ug/dL (ref 250–450)
UIBC: 260 ug/dL

## 2017-01-12 LAB — TSH: TSH: 1.482 u[IU]/mL (ref 0.350–4.500)

## 2017-01-12 MED ORDER — IOPAMIDOL (ISOVUE-300) INJECTION 61%
100.0000 mL | Freq: Once | INTRAVENOUS | Status: AC | PRN
  Administered 2017-01-12: 100 mL via INTRAVENOUS

## 2017-01-13 ENCOUNTER — Encounter (HOSPITAL_COMMUNITY): Payer: PPO

## 2017-01-13 ENCOUNTER — Encounter: Payer: PPO | Admitting: Vascular Surgery

## 2017-01-15 ENCOUNTER — Encounter (HOSPITAL_COMMUNITY): Payer: Self-pay | Admitting: Oncology

## 2017-01-15 ENCOUNTER — Encounter (HOSPITAL_BASED_OUTPATIENT_CLINIC_OR_DEPARTMENT_OTHER): Payer: PPO | Admitting: Oncology

## 2017-01-15 VITALS — BP 146/69 | HR 66 | Temp 97.9°F | Resp 16 | Ht 67.5 in | Wt 245.6 lb

## 2017-01-15 DIAGNOSIS — L98492 Non-pressure chronic ulcer of skin of other sites with fat layer exposed: Secondary | ICD-10-CM | POA: Diagnosis not present

## 2017-01-15 DIAGNOSIS — R5383 Other fatigue: Secondary | ICD-10-CM | POA: Diagnosis not present

## 2017-01-15 DIAGNOSIS — D6851 Activated protein C resistance: Secondary | ICD-10-CM

## 2017-01-15 DIAGNOSIS — I82522 Chronic embolism and thrombosis of left iliac vein: Secondary | ICD-10-CM | POA: Diagnosis not present

## 2017-01-15 DIAGNOSIS — D510 Vitamin B12 deficiency anemia due to intrinsic factor deficiency: Secondary | ICD-10-CM

## 2017-01-15 DIAGNOSIS — Z7901 Long term (current) use of anticoagulants: Secondary | ICD-10-CM | POA: Diagnosis not present

## 2017-01-15 DIAGNOSIS — C49A3 Gastrointestinal stromal tumor of small intestine: Secondary | ICD-10-CM

## 2017-01-15 MED ORDER — XARELTO 20 MG PO TABS: 20.0000 mg | ORAL_TABLET | Freq: Every day | ORAL | 11 refills | Status: DC

## 2017-01-15 NOTE — Patient Instructions (Signed)
Labs recently are normal.  Blood counts are normal.  Thyroid test was normal.  Iron studies are normal. Continue Xarelto daily as prescribed.  New prescription is sent to your pharmacy. Labs in 6 months. Return in 6 months.

## 2017-01-15 NOTE — Assessment & Plan Note (Signed)
Stage II GIST, status post resection by Dr. Arnoldo Morale on 01/05/2011. She was started on adjuvant imatinib, but was concave by cardiac dysrhythmia. Therefore imatinib therapy was discontinued as this was considered an adverse event.  I personally reviewed and went over radiographic studies with the patient.  The results are noted within this dictation.  I personally reviewed the images in PACS.  CT abdomen/pelvis on 01/13/2017 was negative for any evidence of recurrence of GIST. Additional findings include normal small bowel of colon and noted small bowel anastomosis without obstruction. There is a chronic occlusion of left iliac vein with collateral venous circulation in the lower abdominal wall.  Will continue with annual CT imaging for surveillance. Next CT abdomen/pelvis will be due in December 2019.  This order can be placed at her next follow-up appointment.

## 2017-01-15 NOTE — Assessment & Plan Note (Signed)
Pernicious anemia, on lifelong B12 injections. She self administers this at home on a monthly basis.

## 2017-01-15 NOTE — Progress Notes (Signed)
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  INTERVAL HISTORY: [name] 66 y.o. female returns for followup of heterozygous factor V leiden mutation with RECURRENT DVT, resulting in lifelong anticoagulation.  Heterozygous factor V Leiden by itself is not a strong thrombophilia and does not require lifelong anticoagulation, BUT given her history of RECURRENT DVT, she will be maintained on lifelong anticoagulation. AND GIST, Stage II, S/P resection by Dr. Arnoldo Morale on 01/05/2011.  NED  HPI Elements   Location: Lower extremity   Quality: DVT   Severity: Severe   Duration: Years   Context: Recurrent   Timing: Lifelong anticoagulation   Modifying Factors: History of GIST in 2012   Associated Signs & Symptoms:    From a hematology standpoint she is doing very well. She denies any blood in her stools or black stools. She denies any hemoptysis, epistaxis, or vaginal bleeding. She is tolerating Xarelto well. She notes an increase in her fatigue over the last 3 months, but her blood work today is very good. Her hemoglobin is normal and her iron studies are unimpressive and normal as well. Clinically, I rarely seen patient's complaint of fatigue related to Xarelto but this would be a possibility potentially.  From an oncology standpoint, she is status post restaging  imaging a few days ago. Imaging was negative for any signs of recurrence of disease. It did reveal her chronically obstructed left iliac vein blood clot.  Additionally, collaterals in the lower abdomen are noted secondary to this.  She's recently developed a left medial ankle ulcer. She has a history of developing left lower extremity ulcerations. She notes that it is improving. He is documenting in EMR is noted from November when this ulcer first started. She has refused wound clinic referral. She is keeping this clean and dry at home. She is using an antibiotic ointment. She denies any fevers or chills. Examination of ulcer shows that it is not infected and is very clean. There is healthy granulation tissue noted as well. She cannot recall any trauma associated with this ulcer development, but admits to being very active and continuing to do significant yardwork around the time of the development of this ulcer, therefore she would not be surprised if there was some sort of traumatic events that cause this issue.  Review of Systems  Constitutional: Positive for malaise/fatigue. Negative for chills, fever and weight loss.  HENT: Negative.   Eyes: Negative.   Respiratory: Negative.  Negative for cough.   Cardiovascular: Negative.  Negative for chest pain.  Gastrointestinal: Negative.  Negative for blood in stool, constipation, diarrhea, melena, nausea and vomiting.  Genitourinary: Negative.   Musculoskeletal: Negative.   Skin: Negative.        Left medial ankle ulcer  Neurological: Negative.  Negative for weakness.  Endo/Heme/Allergies: Negative.  Does not bruise/bleed easily.  Psychiatric/Behavioral: Negative.     Past Medical History:  Diagnosis Date  . Allergic urticaria 01/04/2011   Rash from tape.  . Anxiety   . Cellulitis of left leg 2006  . Clotting disorder ([place])    heterozygosity from factor v leiden  . DVT (deep venous thrombosis) ([place]) 07/10/2010   on coumadin  . Dyspnea     with exertion  . Factor V Leiden ([place])   . GERD (gastroesophageal reflux disease)   . GIST (gastrointestinal stromal tumor), malignant ([place]) 01/03/2011   S/P resection on 01/05/11.  Intolerant to [employer]. Skin Cancer Left hand  . History of blood transfusion   . Hypertension   . Pernicious anemia 07/10/2010  . Small bowel mass 01/03/2011   s/p surgery  . Ulcer 05/2009   esophageal  . Ventricular tachycardia ([place]) 03/26/11  . Vitamin B12 deficiency    vit b12 1000 mcg monthly    Past Surgical History:  Procedure Laterality Date  . ABDOMINAL HYSTERECTOMY  1989  . BALLOON DILATION  12/11/2010   Procedure: BALLOON DILATION;  Surgeon: Rogene Houston, MD;  [facility];  Service: Endoscopy;  Laterality: N/A;  . BOWEL RESECTION  01/05/2011   Procedure: SMALL BOWEL RESECTION;  Surgeon: Jamesetta So;  Location: [facility] ORS;  Service: General;;  Partial Small Bowel Resection  . COLONOSCOPY  02/25/2012   Procedure: COLONOSCOPY;  Surgeon: Rogene Houston, MD;  Location[facility];  Service: Endoscopy;  Laterality: N/A;  1200  . GIVENS CAPSULE STUDY  01/02/2011   Procedure: GIVENS CAPSULE STUDY;  Surgeon: Rogene Houston, MD;  [facility];  Service: Endoscopy;  Laterality: N/A;  . LAPAROTOMY  01/05/2011   Procedure: EXPLORATORY LAPAROTOMY;  Surgeon: Jamesetta So;  Location: [facility];  Service: General;  Laterality: N/A;  . OPEN REDUCTION INTERNAL FIXATION (ORIF) SCAPHOID WITH DISTAL RADIUS GRAFT Right 02/01/2016   Procedure: Right distal radius open reduction and internal fixation and repair as indicated;  Surgeon: Iran Planas, MD;  Location: [place];  Service: Orthopedics;  Laterality: Right;  Requests 90 mins    History reviewed. No pertinent family history.  Social History   Socioeconomic History  . Marital status: Married    Spouse name: None  . Number of children: None  . Years of education: None  . Highest education level: None  Social Needs  . Financial resource  strain: None  . Food insecurity - worry: None  . Food insecurity - inability: None  . Transportation needs - medical: None  . Transportation needs - non-medical: None  Occupational History  . None  Tobacco Use  . Smoking status: Never Smoker  . Smokeless tobacco: Never Used  Substance and Sexual Activity  . Alcohol use: No  . Drug use: No  . Sexual activity: Not Currently  Other Topics Concern  . None  Social History Narrative  . None     PHYSICAL EXAMINATION  ECOG PERFORMANCE STATUS: 0 - Asymptomatic  Vitals:   01/15/17 1014  BP: (!) 146/69  Pulse: 66  Resp: 16  Temp: 97.9 F (36.6 C)  SpO2: 97%    GENERAL:alert, no distress, well nourished, well developed, comfortable, cooperative, obese, smiling and unaccompanied SKIN: skin color, texture, turgor are normal, positive for: left medial ankle ulcer, measuring 2 cm in size, draining clear/yellow fluid with minimal blood.  No erythema noted.  No heat appreciated on exam.   HEAD: Normocephalic, No masses, lesions, tenderness or abnormalities EYES: normal, EOMI, Conjunctiva are pink and non-injected EARS: External ears normal OROPHARYNX:lips,  buccal mucosa, and tongue normal, dentition normal and mucous membranes are moist  NECK: supple, no adenopathy, trachea midline LYMPH:  no palpable lymphadenopathy BREAST:not examined LUNGS: clear to auscultation and percussion HEART: regular rate & rhythm, no murmurs, no gallops, S1 normal and S2 normal ABDOMEN:abdomen soft, non-tender, obese and normal bowel sounds BACK: Back symmetric, no curvature. EXTREMITIES:less then 2 second capillary refill, no joint deformities, effusion, or inflammation, no skin discoloration, no cyanosis, positive findings:  Left medial ankle ulcer, see above (skin exam).  NEURO: alert & oriented x 3 with fluent speech, no focal motor/sensory deficits, gait normal   LABORATORY DATA: CBC    Component Value Date/Time   WBC 5.6 01/12/2017 1256   RBC  5.00 01/12/2017 1256   HGB 14.3 01/12/2017 1256   HCT 45.3 01/12/2017 1256   PLT 259 01/12/2017 1256   MCV 90.6 01/12/2017 1256   MCH 28.6 01/12/2017 1256   MCHC 31.6 01/12/2017 1256   RDW 13.6 01/12/2017 1256   LYMPHSABS 2.1 01/12/2017 1256   MONOABS 0.6 01/12/2017 1256   EOSABS 0.1 01/12/2017 1256   BASOSABS 0.0 01/12/2017 1256      Chemistry      Component Value Date/Time   NA 140 01/12/2017 1256   K 4.1 01/12/2017 1256   CL 106 01/12/2017 1256   CO2 24 01/12/2017 1256   BUN 11 01/12/2017 1256   CREATININE 0.74 01/12/2017 1256      Component Value Date/Time   CALCIUM 9.7 01/12/2017 1256   ALKPHOS 96 01/12/2017 1256   AST 38 01/12/2017 1256   ALT 46 01/12/2017 1256   BILITOT 0.5 01/12/2017 1256     Lab Results  Component Value Date   IRON 61 01/12/2017   TIBC 321 01/12/2017   FERRITIN 70 01/12/2017   Lab Results  Component Value Date   TSH 1.482 01/12/2017     PENDING LABS:   RADIOGRAPHIC STUDIES:  Ct Abdomen Pelvis W Contrast  Result Date: 01/13/2017 CLINICAL DATA:  F/U GIST 2012 WITH BOWEL RESECTION AND ONGOING VIT K THERAPY. HX HTN, GERD, HYSTERECTOMY. PT DENIES ANY CURRENT ISSUES. EXAM: CT ABDOMEN AND PELVIS WITH CONTRAST TECHNIQUE: Multidetector CT imaging of the abdomen and pelvis was performed using the standard protocol following bolus administration of intravenous contrast. CONTRAST:  113m ISOVUE-300 IOPAMIDOL (ISOVUE-300) INJECTION 61% COMPARISON:  01/15/2015 FINDINGS: Lower chest: Lung bases are clear. Hepatobiliary: No focal hepatic lesion. No biliary duct dilatation. Gallbladder is normal. Common bile duct is normal. Pancreas: Pancreas is normal. No ductal dilatation. No pancreatic inflammation. Spleen: Normal spleen Adrenals/urinary tract: Large LEFT adrenal gland lesion again noted measuring 2.8 cm. Lesion has low in density consistent benign adenoma. RIGHT adrenal gland normal. Extrarenal pelvis on the RIGHT. No ureteral obstruction. Ureters and  bladder normal. Stomach/Bowel: Small hiatal hernia. Stomach is normal. No mass lesion identified. Enteric enteric anastomosis in the mid small bowel. No obstruction. No small bowel lesion. Terminal ileum and appendix are normal. Ascending, transverse and descending colon normal. Vascular/Lymphatic: Abdominal aorta is normal caliber. There is no retroperitoneal or periportal lymphadenopathy. No pelvic lymphadenopathy. Extensive venous collaterals in the subcutaneous tissue of the suprapubic lower abdomen. There is chronic thrombosis of the LEFT iliac vein. Reproductive: Post hysterectomy anatomy Other: No free fluid. Musculoskeletal: No aggressive osseous lesion. IMPRESSION: 1. No evidence of gastrointestinal stromal tumor recurrence in stomach. 2. No abnormality of the small bowel colon. Small bowel anastomosis without obstruction 3. Chronic occlusion of the LEFT iliac vein with collateral venous circulation in the  lower abdominal wall. Electronically Signed   By: Suzy Bouchard M.D.   On: 01/13/2017 10:03     PATHOLOGY:    ASSESSMENT AND PLAN:  Factor V Leiden ([place]) Factor V Leiden heterozygosity with recurrent DVT, resulting in lifelong anticoagulation. Heterozygous factor V Leiden by itself is not a strong thrombophilia and does not typically require lifelong anticoagulation, BUT given her history of RECURRENT DVT,  lifelong anticoagulation will be maintained.  Previously, she was on vitamin K antagonist therapy (Coumadin) but given the inconvenience of repeated INR checks, the patient opted to switch to NOAC and therefore Xarelto was started on 09/14/2016.   Labs from 01/11/2017 are reviewed: CBC diff, CMET, iron/TIBC, ferritin, TSH.  I personally reviewed and went over laboratory results with the patient.  The results are noted within this dictation. Blood counts are within normal limits. Metabolic panel is unimpressive and minimal hyperproteinemia is noted. Iron studies are within normal limits  with a normal hemoglobin. TSH is within normal limits as well.  Labs in 6 months: CBC diff, CMET, iron/TIBC, ferritin.    If hyperproteinemia persists in 6 months, I would recommend multiple myeloma screening. I have a low suspicion at this time for any primary bone marrow issue given her normal blood counts. Calcium was within normal limits in 12/2016.   I personally reviewed and went over radiographic studies with the patient.  The results are noted within this dictation.  I personally reviewed the images in PACS.  Mammogram in August 2018 was BI-RADS Category 1.   Next screening mammogram will be due in August 2019.  Returns 6 months for follow-up.  GIST (gastrointestinal stromal tumor), malignant ([place]) Stage II GIST, status post resection by Dr. Arnoldo Morale on 01/05/2011. She was started on adjuvant imatinib, but was concave by cardiac dysrhythmia. Therefore imatinib therapy was discontinued as this was considered an adverse event.  I personally reviewed and went over radiographic studies with the patient.  The results are noted within this dictation.  I personally reviewed the images in PACS.  CT abdomen/pelvis on 01/13/2017 was negative for any evidence of recurrence of GIST. Additional findings include normal small bowel of colon and noted small bowel anastomosis without obstruction. There is a chronic occlusion of left iliac vein with collateral venous circulation in the lower abdominal wall.  Will continue with annual CT imaging for surveillance. Next CT abdomen/pelvis will be due in December 2019.  This order can be placed at her next follow-up appointment.  Pernicious anemia Pernicious anemia, on lifelong B12 injections. She self administers this at home on a monthly basis.   4. Chronic anticoagulation On Xarelto  5. Chronic deep vein thrombosis (DVT) of iliac vein of left lower extremity ([name]) Redemonstrated on recent CT imaging of abdomen and pelvis in December 2019 with  development of collateral veins.  6. Skin ulcer with fat layer exposed ([place]) Began in October/November 2018 located on the medial aspect of left ankle. Clean and dry. Being managed at home. Patient refuses wound clinic referral. She has a history of developing skin ulcers of her left lower chilly. I suspect she has been vascular disease of this leg given her history of DVTs in her left lower extremity in addition to develop collateral veins.  I have recommended vascular surgery referral and she declines at this time.   Final Result of Complexity      Choose decision making level with 2 or 3 checks OR choose the decision making level on Section B  A Number of diagnoses or treatment options  '[]'   </= 1 Minimal  '[]'   2 Limited  '[]'   3 Multiple  '[x]'   >/= 4 Extensive  B Amount and complexity of data  '[]'   </= 1 Minimal or low  '[]'   2 Limited  '[]'   3  Moderate  '[x]'   >/= 4 Extensive  C Highest risk  '[]'   Minimal  '[]'   Low  '[]'   Moderate  '[x]'   High   Type of decision making  '[]'   Straight-forward  '[]'   Low Complexity  '[]'   Moderate- Complexity  '[x]'   High- Complexity      ORDERS PLACED FOR THIS ENCOUNTER: Orders Placed This Encounter  Procedures  . CBC with Differential  . Comprehensive metabolic panel  . Iron and TIBC  . Ferritin  . Vitamin B12  . Methylmalonic acid, serum    MEDICATIONS PRESCRIBED THIS ENCOUNTER: Meds ordered this encounter  Medications  . XARELTO 20 MG TABS tablet    Sig: Take 1 tablet (20 mg total) by mouth daily with supper.    Dispense:  30 tablet    Refill:  11    Order Specific Question:   Supervising Provider    Answer:   Brunetta Genera [[id number]]    THERAPY PLAN:  Continue monthly B12 injections at home. Continue Xarelto. We will recheck bloodwork in 6 months and she will follow-up in 6 months. Annual imaging of her abdomen and pelvis is reasonable given her history of GIST.  All questions were answered. The patient knows to call the  clinic with any problems, questions or concerns. We can certainly see the patient much sooner if necessary.  Patient and plan discussed with Dr. Twana First and she is in agreement with the aforementioned.   This note is electronically signed by: Doy Mince 01/15/2017 10:55 AM

## 2017-01-15 NOTE — Assessment & Plan Note (Signed)
Factor V Leiden heterozygosity with recurrent DVT, resulting in lifelong anticoagulation. Heterozygous factor V Leiden by itself is not a strong thrombophilia and does not typically require lifelong anticoagulation, BUT given her history of RECURRENT DVT,  lifelong anticoagulation will be maintained.  Previously, she was on vitamin K antagonist therapy (Coumadin) but given the inconvenience of repeated INR checks, the patient opted to switch to NOAC and therefore Xarelto was started on 09/14/2016.   Labs from 01/11/2017 are reviewed: CBC diff, CMET, iron/TIBC, ferritin, TSH.  I personally reviewed and went over laboratory results with the patient.  The results are noted within this dictation. Blood counts are within normal limits. Metabolic panel is unimpressive and minimal hyperproteinemia is noted. Iron studies are within normal limits with a normal hemoglobin. TSH is within normal limits as well.  Labs in 6 months: CBC diff, CMET, iron/TIBC, ferritin.    If hyperproteinemia persists in 6 months, I would recommend multiple myeloma screening. I have a low suspicion at this time for any primary bone marrow issue given her normal blood counts. Calcium was within normal limits in 12/2016.   I personally reviewed and went over radiographic studies with the patient.  The results are noted within this dictation.  I personally reviewed the images in PACS.  Mammogram in August 2018 was BI-RADS Category 1.   Next screening mammogram will be due in August 2019.  Returns 6 months for follow-up.

## 2017-01-31 ENCOUNTER — Encounter (HOSPITAL_COMMUNITY): Payer: Self-pay | Admitting: *Deleted

## 2017-01-31 ENCOUNTER — Other Ambulatory Visit: Payer: Self-pay

## 2017-01-31 ENCOUNTER — Emergency Department (HOSPITAL_COMMUNITY)
Admission: EM | Admit: 2017-01-31 | Discharge: 2017-01-31 | Disposition: A | Discharge status: Home / Self Care | Payer: Medicare Other | Attending: Emergency Medicine | Admitting: Emergency Medicine

## 2017-01-31 ENCOUNTER — Emergency Department (HOSPITAL_COMMUNITY): Payer: Medicare Other

## 2017-01-31 DIAGNOSIS — M549 Dorsalgia, unspecified: Secondary | ICD-10-CM

## 2017-01-31 DIAGNOSIS — I10 Essential (primary) hypertension: Secondary | ICD-10-CM | POA: Diagnosis not present

## 2017-01-31 DIAGNOSIS — M546 Pain in thoracic spine: Secondary | ICD-10-CM | POA: Diagnosis not present

## 2017-01-31 DIAGNOSIS — Z7901 Long term (current) use of anticoagulants: Secondary | ICD-10-CM | POA: Insufficient documentation

## 2017-01-31 DIAGNOSIS — Z91040 Latex allergy status: Secondary | ICD-10-CM | POA: Insufficient documentation

## 2017-01-31 DIAGNOSIS — Z79899 Other long term (current) drug therapy: Secondary | ICD-10-CM | POA: Insufficient documentation

## 2017-01-31 DIAGNOSIS — R0789 Other chest pain: Secondary | ICD-10-CM | POA: Insufficient documentation

## 2017-01-31 DIAGNOSIS — K449 Diaphragmatic hernia without obstruction or gangrene: Secondary | ICD-10-CM | POA: Diagnosis not present

## 2017-01-31 DIAGNOSIS — R079 Chest pain, unspecified: Secondary | ICD-10-CM | POA: Diagnosis not present

## 2017-01-31 LAB — I-STAT TROPONIN, ED: Troponin i, poc: 0 ng/mL (ref 0.00–0.08)

## 2017-01-31 LAB — COMPREHENSIVE METABOLIC PANEL
ALT: 31 U/L (ref 14–54)
AST: 26 U/L (ref 15–41)
Albumin: 3.8 g/dL (ref 3.5–5.0)
Alkaline Phosphatase: 81 U/L (ref 38–126)
Anion gap: 10 (ref 5–15)
BUN: 18 mg/dL (ref 6–20)
CHLORIDE: 108 mmol/L (ref 101–111)
CO2: 23 mmol/L (ref 22–32)
Calcium: 9.1 mg/dL (ref 8.9–10.3)
Creatinine, Ser: 0.73 mg/dL (ref 0.44–1.00)
Glucose, Bld: 122 mg/dL — ABNORMAL HIGH (ref 65–99)
POTASSIUM: 3.9 mmol/L (ref 3.5–5.1)
SODIUM: 141 mmol/L (ref 135–145)
Total Bilirubin: 0.4 mg/dL (ref 0.3–1.2)
Total Protein: 7.2 g/dL (ref 6.5–8.1)

## 2017-01-31 LAB — CBC
HEMATOCRIT: 42.4 % (ref 36.0–46.0)
HEMOGLOBIN: 13.2 g/dL (ref 12.0–15.0)
MCH: 28.4 pg (ref 26.0–34.0)
MCHC: 31.1 g/dL (ref 30.0–36.0)
MCV: 91.2 fL (ref 78.0–100.0)
Platelets: 252 10*3/uL (ref 150–400)
RBC: 4.65 MIL/uL (ref 3.87–5.11)
RDW: 13.4 % (ref 11.5–15.5)
WBC: 5.6 10*3/uL (ref 4.0–10.5)

## 2017-01-31 LAB — TROPONIN I

## 2017-01-31 MED ORDER — METHOCARBAMOL 500 MG PO TABS: ORAL_TABLET | ORAL | 0 refills | Status: DC

## 2017-01-31 MED ORDER — IOPAMIDOL (ISOVUE-370) INJECTION 76%
100.0000 mL | Freq: Once | INTRAVENOUS | Status: AC | PRN
  Administered 2017-01-31: 100 mL via INTRAVENOUS

## 2017-01-31 NOTE — ED Provider Notes (Signed)
[facility] EMERGENCY DEPARTMENT Provider Note   CSN: [id number] Arrival date & time: 01/31/17  0045  Time seen 01:24 AM   History   Chief Complaint Chief Complaint  Patient presents with  . Chest Pain    HPI [name] is a 67 y.o. female.  HPI patient reports about 8:30 PM she was watching TV and had acute onset of left anterior lower chest pain that felt like a dead weight sitting on her.  She states around 10 PM the pain got worse and started radiating between her shoulder blades.  She states now the chest pain is gone but she still has some discomfort between her shoulder blades.  She states the chest pain lasted about 2 hours.  She states she feels like her right jaw is locking up.  Husband states he checked her blood pressure at home and it was 191/90.  She denies diaphoresis, shortness of breath but she did have some nausea without vomiting.  Nothing she does makes the pain worse, nothing she does makes it feel better.  She states she is never had this pain before.  She states she has a history of DVT in her left leg about 30 years ago and she has factor V Leiden defect.  She was on Coumadin however she was changed to [place] in September.  She denies any change in the pain or swelling in her left lower leg although she states she has a another ulcer in her left leg.  She states she has had them before.  This ulcer appeared about a month ago.  She states it has been worse than the one she has now.  She denies any family history of coronary artery disease or aneurysms.  She denies a personal history of diabetes or hypertension.  She denies any change in her activity recently.  She denies any cough.  Patient states she is on metoprolol to control her heart rate.  She states when she was being treated for cancer she had an episode which sounds like may be SVT, but in her problem list it states V. tach.  She states she was being followed by Dr. Lovena Le, cardiologist however he states that  now she can be followed by her PCP.  PCP [name], MD  Past Medical History:  Diagnosis Date  . Allergic urticaria 01/04/2011   Rash from tape.  . Anxiety   . Cellulitis of left leg 2006  . Clotting disorder ([place])    heterozygosity from factor v leiden  . DVT (deep venous thrombosis) ([place]) 07/10/2010   on coumadin  . Dyspnea    with exertion  . Factor V Leiden ([place])   . GERD (gastroesophageal reflux disease)   . 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  Intolerant to [employer]. Skin Cancer Left hand  . History of blood transfusion   . Hypertension   . Pernicious anemia 07/10/2010  . Small bowel mass 01/03/2011   s/p surgery  . Ulcer 05/2009   esophageal  . Ventricular tachycardia ([place]) 03/26/11  . Vitamin B12 deficiency    vit b12 1000 mcg monthly    Patient Active Problem List   Diagnosis Date Noted  . Depression 11/19/2014  . Hypotension, iatrogenic 03/26/2011  . Lip swelling 01/03/2011  . 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 Chronic ulcer of left leg ([place]) 01/01/2011  . Chronic anticoagulation 01/01/2011  . Factor V Leiden ([place])   .  DVT (deep venous thrombosis) ([place]) 07/10/2010  . Pernicious anemia 07/10/2010  . Iron deficiency anemia 08/27/2009  . WEIGHT GAIN 08/27/2009  . Reflux esophagitis 07/05/2009  . GI BLEEDING 06/11/2009    Past Surgical History:  Procedure Laterality Date  . ABDOMINAL HYSTERECTOMY  1989  . BALLOON DILATION  12/11/2010   Procedure: BALLOON DILATION;  Surgeon: Rogene Houston, MD;  Location: [facility] ENDO SUITE;  Service: Endoscopy;  Laterality: N/A;  . BOWEL RESECTION  01/05/2011   Procedure: SMALL BOWEL RESECTION;  Surgeon: Jamesetta So;  Location: [facility] ORS;  Service: General;;  Partial Small Bowel Resection  . COLONOSCOPY  02/25/2012   Procedure: COLONOSCOPY;  Surgeon: Rogene Houston, MD;  Location: [facility] ENDO SUITE;  Service: Endoscopy;  Laterality: N/A;   1200  . GIVENS CAPSULE STUDY  01/02/2011   Procedure: GIVENS CAPSULE STUDY;  Surgeon: Rogene Houston, MD;  Location: [facility] ENDO SUITE;  Service: Endoscopy;  Laterality: N/A;  . LAPAROTOMY  01/05/2011   Procedure: EXPLORATORY LAPAROTOMY;  Surgeon: Jamesetta So;  Location: [facility] ORS;  Service: General;  Laterality: N/A;  . OPEN REDUCTION INTERNAL FIXATION (ORIF) SCAPHOID WITH DISTAL RADIUS GRAFT Right 02/01/2016   Procedure: Right distal radius open reduction and internal fixation and repair as indicated;  Surgeon: Iran Planas, MD;  Location: [place];  Service: Orthopedics;  Laterality: Right;  Requests 90 mins    OB History    Gravida Para Term Preterm AB Living   3 3 3     2    SAB TAB Ectopic Multiple Live Births                   Home Medications    Prior to Admission medications   Medication Sig Start Date End Date Taking? Authorizing Provider  acetaminophen (TYLENOL) 500 MG tablet Take 500-1,000 mg by mouth every 6 (six) hours as needed for moderate pain.   Yes [provider]  cyanocobalamin (,VITAMIN B-12,) 1000 MCG/ML injection INJECT 1ML IM EVERY 30 DAYS 12/16/16  Yes Twana First, MD  diphenhydrAMINE (BENADRYL) 25 mg capsule Take 25 mg by mouth every 6 (six) hours as needed for allergies (bee stings).    Yes [provider]  metoprolol tartrate (LOPRESSOR) 25 MG tablet TAKE 1/2 TABLET TWICE DAILY 06/24/16  Yes [name], MD  omeprazole (PRILOSEC) 20 MG capsule Take 20 mg by mouth daily.   Yes [provider]  XARELTO 20 MG TABS tablet Take 1 tablet (20 mg total) by mouth daily with supper. 01/15/17  Yes Baird Cancer, PA-C  methocarbamol (ROBAXIN) 500 MG tablet Take 1 or 2 po Q 6hrs for muscle pain 01/31/17   Rolland Porter, MD    Family History No family history on file.  Social History Social History   Tobacco Use  . Smoking status: Never Smoker  . Smokeless tobacco: Never Used  Substance Use Topics  . Alcohol use: No  . Drug use: No  lives  at home Lives with spouse   Allergies   Imatinib; Other; Penicillins; Bee venom; Cephalexin; Dexlansoprazole; Latex; Nylon; Sulfonamide derivatives; and Tape   Review of Systems Review of Systems  All other systems reviewed and are negative.    Physical Exam Updated Vital Signs BP (!) 162/89 (BP Location: Left Arm)   Pulse 75   Temp 97.7 F (36.5 C) (Oral)   Resp 15   Ht 5' 7.5" (1.715 m)   Wt 108.9 kg (240 lb)   SpO2 95%  BMI 37.03 kg/m   Vital signs normal    Physical Exam  Constitutional: She is oriented to person, place, and time. She appears well-developed and well-nourished.  Non-toxic appearance. She does not appear ill. No distress.  HENT:  Head: Normocephalic and atraumatic.  Right Ear: External ear normal.  Left Ear: External ear normal.  Nose: Nose normal. No mucosal edema or rhinorrhea.  Mouth/Throat: Oropharynx is clear and moist and mucous membranes are normal. No dental abscesses or uvula swelling.  Eyes: Conjunctivae and EOM are normal. Pupils are equal, round, and reactive to light.  Neck: Normal range of motion and full passive range of motion without pain. Neck supple.  Cardiovascular: Normal rate, regular rhythm and normal heart sounds. Exam reveals no gallop and no friction rub.  No murmur heard. Pulmonary/Chest: Effort normal and breath sounds normal. No respiratory distress. She has no wheezes. She has no rhonchi. She has no rales. She exhibits no tenderness and no crepitus.  Abdominal: Soft. Normal appearance and bowel sounds are normal. She exhibits no distension. There is no tenderness. There is no rebound and no guarding.  Musculoskeletal: Normal range of motion. She exhibits no edema or tenderness.       Arms: Moves all extremities well.  Area of her back pain is noted  Patient has a ulcer on the medial aspect of her left ankle near the malleolus.  There is some chronic skin changes around it.  She has some prominent veins in the lower  extremity.  She states the wound is appearing better than it was.  Neurological: She is alert and oriented to person, place, and time. She has normal strength. No cranial nerve deficit.  Skin: Skin is warm, dry and intact. No rash noted. No erythema. No pallor.  Psychiatric: She has a normal mood and affect. Her speech is normal and behavior is normal. Her mood appears not anxious.  Nursing note and vitals reviewed.      ED Treatments / Results  Labs (all labs ordered are listed, but only abnormal results are displayed)  Results for orders placed or performed during the hospital encounter of 01/31/17  CBC  Result Value Ref Range   WBC 5.6 4.0 - 10.5 K/uL   RBC 4.65 3.87 - 5.11 MIL/uL   Hemoglobin 13.2 12.0 - 15.0 g/dL   HCT 42.4 36.0 - 46.0 %   MCV 91.2 78.0 - 100.0 fL   MCH 28.4 26.0 - 34.0 pg   MCHC 31.1 30.0 - 36.0 g/dL   RDW 13.4 11.5 - 15.5 %   Platelets 252 150 - 400 K/uL  Comprehensive metabolic panel  Result Value Ref Range   Sodium 141 135 - 145 mmol/L   Potassium 3.9 3.5 - 5.1 mmol/L   Chloride 108 101 - 111 mmol/L   CO2 23 22 - 32 mmol/L   Glucose, Bld 122 (H) 65 - 99 mg/dL   BUN 18 6 - 20 mg/dL   Creatinine, Ser 0.73 0.44 - 1.00 mg/dL   Calcium 9.1 8.9 - 10.3 mg/dL   Total Protein 7.2 6.5 - 8.1 g/dL   Albumin 3.8 3.5 - 5.0 g/dL   AST 26 15 - 41 U/L   ALT 31 14 - 54 U/L   Alkaline Phosphatase 81 38 - 126 U/L   Total Bilirubin 0.4 0.3 - 1.2 mg/dL   GFR calc non Af Amer >60 >60 mL/min   GFR calc Af Amer >60 >60 mL/min   Anion gap 10 5 - 15  Troponin I  Result Value Ref Range   Troponin I <0.03 <0.03 ng/mL  I-stat troponin, ED  Result Value Ref Range   Troponin i, poc 0.00 0.00 - 0.08 ng/mL   Comment 3            Laboratory interpretation all normal except hyperglycemia      EKG  EKG Interpretation None       ED ECG REPORT   Date: 01/31/2017  Rate: 69  Rhythm: normal sinus rhythm  QRS Axis: left  Intervals: normal  ST/T Wave  abnormalities: normal  Conduction Disutrbances:none  Narrative Interpretation: artifact, low voltage chest leads  Old EKG Reviewed: changes noted HR faster from 30 Jan 2016  I have personally reviewed the EKG tracing and agree with the computerized printout as noted.   Radiology Dg Chest 2 View  Result Date: 01/31/2017 CLINICAL DATA:  Acute onset of right-sided chest pain while watching TV. EXAM: CHEST  2 VIEW COMPARISON:  Radiograph 01/23/2015 FINDINGS: Lower lung volumes from prior exam leading to bronchovascular crowding. The cardiomediastinal contours are unchanged. Pulmonary vasculature is normal. No consolidation, pleural effusion, or pneumothorax. No acute osseous abnormalities are seen. IMPRESSION: Low lung volumes without acute abnormality. Electronically Signed   By: Jeb Levering M.D.   On: 01/31/2017 02:19   Ct Angio Chest/abd/pel For Dissection W And/or W/wo  Result Date: 01/31/2017 CLINICAL DATA:  Acute onset of chest pain radiating into upper back. EXAM: CT ANGIOGRAPHY CHEST, ABDOMEN AND PELVIS TECHNIQUE: Multidetector CT imaging through the chest, abdomen and pelvis was performed using the standard protocol during bolus administration of intravenous contrast. Multiplanar reconstructed images and MIPs were obtained and reviewed to evaluate the vascular anatomy. CONTRAST:  162mL ISOVUE-370 IOPAMIDOL (ISOVUE-370) INJECTION 76% COMPARISON:  Chest radiograph earlier this day. Abdominal CT 01/12/2017 FINDINGS: CTA CHEST FINDINGS Cardiovascular: Normal caliber abdominal aorta without dissection, hematoma, aneurysm or acute aortic syndrome. Trace aortic atherosclerosis. The heart is normal in size. No filling defects in the central pulmonary arteries to the lobar level to suggest pulmonary embolus. No pericardial effusion. Mediastinum/Nodes: No mediastinal or hilar adenopathy. Small hiatal hernia. Small low-density lesions in the throughout the thyroid gland are subcentimeter and require no  dedicated imaging follow-up. Lungs/Pleura: Lower lobe bronchial thickening with scattered mucous plugging. No consolidation or pulmonary edema. Scattered subsegmental atelectasis. No pleural fluid. No pulmonary mass. Musculoskeletal: Bone island in the right proximal humerus. There are no acute or suspicious osseous abnormalities. Review of the MIP images confirms the above findings. CTA ABDOMEN AND PELVIS FINDINGS VASCULAR Aorta: Normal caliber aorta without aneurysm, dissection, vasculitis or significant stenosis. Celiac: Patent without evidence of aneurysm, dissection, vasculitis or significant stenosis. SMA: Patent without evidence of aneurysm, dissection, vasculitis or significant stenosis. Renals: Both renal arteries are patent without evidence of aneurysm, dissection, vasculitis, fibromuscular dysplasia or significant stenosis. Atherosclerotic calcification of the of proximal right renal artery. IMA: Patent without evidence of aneurysm, dissection, vasculitis or significant stenosis. Inflow: Patent without evidence of aneurysm, dissection, vasculitis or significant stenosis. Veins: Large lower abdominal collaterals, unchanged. Occlusion of the left iliac vein was better demonstrated on prior CT. Contrast mixing in the portal vein. Review of the MIP images confirms the above findings. NON-VASCULAR Hepatobiliary: No focal hepatic lesion. Gallbladder partially distended. No calcified gallstone or biliary dilatation. Pancreas: No ductal dilatation or inflammation. Spleen: Normal arterial phase enhancement. Adrenals/Urinary Tract: Low-density 2.8 cm left adrenal nodule consistent with adenoma. Right adrenal gland is normal. Extrarenal pelvis of the right kidney was better characterized  on delayed phase imaging, as well as right renal cyst. No hydronephrosis or perinephric edema. Urinary bladder is physiologically distended. Stomach/Bowel: Small hiatal hernia. Stomach is otherwise nondistended. Enteric suture in  the lower abdomen without obstruction or wall thickening. No small bowel dilatation. No colonic wall thickening or inflammation. Minimal sigmoid diverticulosis without diverticulitis. Normal appendix. Lymphatic: Unchanged haziness of the central mesentery with small lymph nodes, not enlarged by size criteria. This is stable in appearance dating back to 2016. No enlarged lymph nodes in the abdomen or pelvis. Reproductive: Status post hysterectomy. No adnexal masses. Other: Small fat containing infra abdominal ventral abdominal wall hernia. No free air, free fluid, or intra-abdominal fluid collection. Musculoskeletal: Scattered bone islands. No acute or suspicious osseous abnormality. Review of the MIP images confirms the above findings. IMPRESSION: 1. No aortic dissection or acute aortic abnormality. Minimal aortic atherosclerosis. 2. Scattered lower lobe bronchial thickening with minimal mucous plugging. 3. No acute abnormality in the abdomen or pelvis. 4. 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 Electronically Signed   By: Jeb Levering M.D.   On: 01/31/2017 02:59    Procedures Procedures (including critical care time)  Medications Ordered in ED Medications  iopamidol (ISOVUE-370) 76 % injection 100 mL (100 mLs Intravenous Contrast Given 01/31/17 0220)     Initial Impression / Assessment and Plan / ED Course  I have reviewed the triage vital signs and the nursing notes.  Pertinent labs & imaging results that were available during my care of the patient were reviewed by me and considered in my medical decision making (see chart for details).     Concern after discussion with the patient was initially possible dissection with the chest pain and pain in her back.  Also to be considered would be PE with her having the Leiden factor V however she is on Xarelto.  And also of course of chest pain would be cardiac event although she does not have any risk  factors.  CTA was done to look for dissection, hopefully if there is a large PE would also be visible.  Cardiac enzymes were also done.  Recheck at 5:13 a.m. patient second troponin is negative, we discussed her CT results which were normal.  She states her pain is gone.  At this point I do not know the cause of her pain however throughout all the bad things such as myocardial infarction, dissection, pulmonary embolus.  She is on chronic anticoagulation, I am going to send her home on a muscle relaxer, if the pain returns she can try ice and heat.  However she should return of course if things seem worse.  Final Clinical Impressions(s) / ED Diagnoses   Final diagnoses:  Atypical chest pain  Upper back pain    ED Discharge Orders        Ordered    methocarbamol (ROBAXIN) 500 MG tablet     01/31/17 0550     Plan discharge  Rolland Porter, MD, Barbette Or, MD 01/31/17 [phone]

## 2017-01-31 NOTE — ED Triage Notes (Signed)
Pt states watching tv when she started having right sided chest pain, pain to right arm & her jaw fells funny.

## 2017-01-31 NOTE — Discharge Instructions (Signed)
If the pain returns, try the muscle relaxer and use ice and heat. Return to the ED if the pain is getting severe or seems worse.

## 2017-01-31 NOTE — ED Notes (Signed)
ED Provider at bedside. 

## 2017-02-10 ENCOUNTER — Ambulatory Visit (INDEPENDENT_AMBULATORY_CARE_PROVIDER_SITE_OTHER): Payer: Medicare Other | Admitting: Pediatrics

## 2017-02-10 ENCOUNTER — Encounter: Payer: Self-pay | Admitting: Pediatrics

## 2017-02-10 VITALS — BP 134/82 | HR 68 | Temp 97.7°F | Ht 67.5 in | Wt 247.2 lb

## 2017-02-10 DIAGNOSIS — I471 Supraventricular tachycardia: Secondary | ICD-10-CM | POA: Diagnosis not present

## 2017-02-10 DIAGNOSIS — I83009 Varicose veins of unspecified lower extremity with ulcer of unspecified site: Secondary | ICD-10-CM | POA: Diagnosis not present

## 2017-02-10 DIAGNOSIS — L97909 Non-pressure chronic ulcer of unspecified part of unspecified lower leg with unspecified severity: Secondary | ICD-10-CM

## 2017-02-10 DIAGNOSIS — R079 Chest pain, unspecified: Secondary | ICD-10-CM

## 2017-02-10 MED ORDER — METOPROLOL TARTRATE 25 MG PO TABS: 12.5000 mg | ORAL_TABLET | Freq: Two times a day (BID) | ORAL | 3 refills | Status: DC

## 2017-02-10 NOTE — Progress Notes (Signed)
  Subjective:   Patient ID: [name], female    DOB: [birth year], 67 y.o.   MRN: [id number] CC: Follow-up  HPI: [name] is a 67 y.o. female presenting for Follow-up  Episode of chest pain 10 days ago, was seen in the emergency room Woke her up from sleep  Felt heavy pressure in middle of chest, R side of chest R arm felt numb, jaw hurt and felt numb She did not feel like her heart was racing In ED her BP was elevated SBP 190s per pt Troponin was negative  Patient has had a ulcer on her left medial ankle for the last few weeks She thinks it is improving.  She has been doing wound care at home Has declined referral to wound care Some drainage daily from the wound, no smell, no redness, no tenderness around the wound She has chronic occlusion of veins in her left leg, patient with history of factor V Leiden and DVTs.  Relevant past medical, surgical, family and social history reviewed. Allergies and medications reviewed and updated. Social History   Tobacco Use  Smoking Status Never Smoker  Smokeless Tobacco Never Used   ROS: Per HPI   Objective:    BP 134/82   Pulse 68   Temp 97.7 F (36.5 C) (Oral)   Ht 5' 7.5" (1.715 m)   Wt 247 lb 3.2 oz (112.1 kg)   BMI 38.15 kg/m   Wt Readings from Last 3 Encounters:  02/10/17 247 lb 3.2 oz (112.1 kg)  01/31/17 240 lb (108.9 kg)  01/15/17 245 lb 9.6 oz (111.4 kg)    Gen: NAD, alert, cooperative with exam, NCAT EYES: EOMI, no conjunctival injection, or no icterus ENT:   OP without erythema LYMPH: no cervical LAD CV: NRRR, normal S1/S2, no murmur, distal pulses 2+ b/l Resp: CTABL, no wheezes, normal WOB Abd: +BS, soft, NTND. no guarding or organomegaly Ext: trace pitting edema L lower leg, none R leg, warm Neuro: Alert and oriented, strength equal b/l UE and LE, coordination grossly normal MSK: normal muscle bulk Skin: Proximal 1 mm deep ulcer left medial ankle, yellow-pink base. No surrounding tenderness or  induration, minimal drainage present on band-aid bandage over ankle. Mid shin with easily compressible varicose vein  Assessment & Plan:  [name] was seen today for follow-up multiple med problems.  Diagnoses and all orders for this visit:  Chest pain, unspecified type Episode of chest pain/pressure with elevated blood pressure Neg troponins in ED Will refer for CAD eval -     Ambulatory referral to Cardiology  SVT (supraventricular tachycardia) ([place]) No recent feeling of heart racing, cont below -     metoprolol tartrate (LOPRESSOR) 25 MG tablet; Take 0.5 tablets (12.5 mg total) by mouth 2 (two) times daily.  Venous ulcer ([place]) Fair amount of drainage Put duoderm on ulcer today, pt to change every 3 days  Follow up plan: Return in about 2 weeks (around 02/24/2017). Assunta Found, MD [place]

## 2017-02-10 NOTE — Patient Instructions (Signed)
[name] [id number]

## 2017-02-11 ENCOUNTER — Encounter: Payer: Self-pay | Admitting: Pediatrics

## 2017-02-25 ENCOUNTER — Ambulatory Visit: Payer: Medicare Other | Admitting: Pediatrics

## 2017-02-26 ENCOUNTER — Ambulatory Visit: Payer: Medicare Other | Admitting: Cardiovascular Disease

## 2017-03-26 ENCOUNTER — Ambulatory Visit (INDEPENDENT_AMBULATORY_CARE_PROVIDER_SITE_OTHER): Payer: PPO | Admitting: Pediatrics

## 2017-03-26 ENCOUNTER — Encounter (HOSPITAL_COMMUNITY): Payer: Self-pay

## 2017-03-26 ENCOUNTER — Encounter: Payer: Self-pay | Admitting: Pediatrics

## 2017-03-26 VITALS — BP 137/84 | HR 97 | Temp 97.8°F | Ht 67.5 in | Wt 242.0 lb

## 2017-03-26 DIAGNOSIS — D6851 Activated protein C resistance: Secondary | ICD-10-CM

## 2017-03-26 DIAGNOSIS — F339 Major depressive disorder, recurrent, unspecified: Secondary | ICD-10-CM | POA: Diagnosis not present

## 2017-03-26 MED ORDER — SERTRALINE HCL 25 MG PO TABS: ORAL_TABLET | ORAL | 1 refills | Status: DC

## 2017-03-26 NOTE — Progress Notes (Signed)
Subjective:   Patient ID: [name], female    DOB: 08-03-1950, 67 y.o.   MRN: [id number] CC: Depression  HPI: [name] is a 67 y.o. female presenting for Depression  Summons to jury duty yesterday. Hasnt been in court house since her son died 80 yrs ago. Continues to have a hard time around holidays re son's death. Son was in and out of trouble and pt spent multiple days in court with him. He died 4 mo after getting out of prison. Was going to group counseling sessions years ago, helped some. Hesitant to restart counseling.   Sleeping more than usual. Sleeping until 2pm, up for a few hours then back to bed sometimes. Last night got up at 2am, all she wants to do is cry. Decreased appetite. Having a hard time paying attention to conversations she has. No thoughts of self harm. She was on antidepressant for about a year 20 yrs ago after her mom died.  She does have a history of Factor V leiden and blood clots. She takes xarelto for repeat blood clots. On car trips she stops every 2 hours to walk to help prevent future clots.  Depression screen [facility] 2/9 03/26/2017 02/10/2017 10/04/2015 03/01/2015 11/19/2014  Decreased Interest 1 0 0 0 2  Down, Depressed, Hopeless 1 0 0 0 2  PHQ - 2 Score 2 0 0 0 4  Altered sleeping 3 - - - 3  Tired, decreased energy 3 - - - 2  Change in appetite 2 - - - 2  Feeling bad or failure about yourself  0 - - - 1  Trouble concentrating 0 - - - 0  Moving slowly or fidgety/restless 0 - - - 0  Suicidal thoughts 0 - - - 0  PHQ-9 Score 10 - - - 12  Difficult doing work/chores Somewhat difficult - - - -  Some recent data might be hidden     Relevant past medical, surgical, family and social history reviewed. Allergies and medications reviewed and updated. Social History   Tobacco Use  Smoking Status Never Smoker  Smokeless Tobacco Never Used   ROS: Per HPI   Objective:    BP 137/84   Pulse 97   Temp 97.8 F (36.6 C) (Oral)   Ht 5' 7.5" (1.715 m)    Wt 242 lb (109.8 kg)   BMI 37.34 kg/m   Wt Readings from Last 3 Encounters:  03/26/17 242 lb (109.8 kg)  02/10/17 247 lb 3.2 oz (112.1 kg)  01/31/17 240 lb (108.9 kg)    Gen: NAD, alert, cooperative with exam, NCAT EYES: EOMI ENT:  OP without erythema LYMPH: no cervical LAD CV: NRRR, normal S1/S2, no murmur, distal pulses 2+ b/l Resp: CTABL, no wheezes, normal WOB Abd: +BS, soft, NTND. no guarding or organomegaly Ext: No edema, warm Neuro: Alert and oriented MSK: normal muscle bulk Psych: full affect, tearful at times  Assessment & Plan:  [name] was seen today for depression.  Diagnoses and all orders for this visit:  Depression, recurrent ([place]) Ongoing symptoms. Feels safe at home. Open to [facility] referral, message sent. Will start below -     sertraline (ZOLOFT) 25 MG tablet; Take 1 tablet by mouth daily for two week then take two tablets daily for 2 weeks.  Factor V Leiden ([place]) On xarelto. Ideally should not sit for periods of time with out regularly getting up to walk. Pt should stay hydrated. Letter saying that given.  Follow up plan: 4  weeks, sooner if needed Assunta Found, MD [place]

## 2017-04-07 ENCOUNTER — Other Ambulatory Visit: Payer: Self-pay

## 2017-04-07 ENCOUNTER — Telehealth: Payer: Self-pay

## 2017-04-07 ENCOUNTER — Ambulatory Visit (INDEPENDENT_AMBULATORY_CARE_PROVIDER_SITE_OTHER): Payer: PPO

## 2017-04-07 VITALS — BP 158/87 | HR 60 | Ht 67.0 in | Wt 248.0 lb

## 2017-04-07 DIAGNOSIS — Z0000 Encounter for general adult medical examination without abnormal findings: Secondary | ICD-10-CM

## 2017-04-07 NOTE — Telephone Encounter (Signed)
Patient was seen today for an AWV and wanted you to know that she didn't start the zoloft because she was concerned over side effects with her xarelto but she is feeling much better and doesn't feel like she needs anything right now. She also brought in an empty tube of Clobetasol cream and stated you needed the name to send to pharmacy. She would like it sent to [facility] in [place].

## 2017-04-07 NOTE — Patient Instructions (Signed)
  [name] , Thank you for taking time to come for your Medicare Wellness Visit. I appreciate your ongoing commitment to your health goals. Please review the following plan we discussed and let me know if I can assist you in the future.   These are the goals we discussed: Goals    . Exercise 150 min/wk Moderate Activity    . Weight < 150 lb (68.04 kg)       This is a list of the screening recommended for you and due dates:  Health Maintenance  Topic Date Due  . HIV Screening  04/16/1966  . Pap Smear  04/15/1972  . DEXA scan (bone density measurement)  04/15/2016  . Pneumonia vaccines (1 of 2 - PCV13) 04/15/2016  . Flu Shot  10/13/2017*  . Mammogram  10/06/2018  . Tetanus Vaccine  11/23/2019  . Colon Cancer Screening  02/24/2022  .  Hepatitis C: One time screening is recommended by Center for Disease Control  (CDC) for  adults born from [birth year] through 1964.   Completed  *Topic was postponed. The date shown is not the original due date.

## 2017-04-08 MED ORDER — CLOBETASOL PROPIONATE 0.05 % EX CREA: 1.0000 "application " | TOPICAL_CREAM | Freq: Two times a day (BID) | CUTANEOUS | 0 refills | Status: DC

## 2017-04-14 NOTE — Progress Notes (Signed)
Subjective:   [name] is a 67 y.o. female who presents for Medicare Annual (Subsequent) preventive examination.      Patient is very pleasant. She presents to office today with her husband who is also having an AWV. She lives at home with husband. They have 3 children. The youngest is deceased. She did not go in to detail about her son. She became teary eyed when talking about the situation and states she gets a little depressed around holidays because he is not there. She attend church regularly and has a Careers adviser at home. One of her sons lives across the road from her so she has a lot of support and family interaction. She enjoys working outside in her flower and the yard and she loves to decorate her home. She has previously worked for [employer] for 36 years, she was a Quarry manager for 6 months, she worked 3 months at [employer], and was a Pensions consultant for 3-4 years and then became disabled around the age of 40 and obtained disability in 2008. She did not finish high school but did obtain her GED. Reviewed health maintenance with patient and she is due for pneumonia vaccines, bone density testing, and a pap smear. She declines all of them. She has had no hospitalizations or surgeries in the last year.   Objective:     Vitals: BP (!) 158/87   Pulse 60   Ht 5\' 7"  (1.702 m)   Wt 248 lb (112.5 kg)   BMI 38.84 kg/m   Body mass index is 38.84 kg/m.  Advanced Directives 04/07/2017 01/31/2017 01/15/2017 07/08/2016 01/30/2016 01/15/2016 01/15/2016  Does Patient Have a Medical Advance Directive? No No No No No No No  Would patient like information on creating a medical advance directive? No - Patient declined - No - Patient declined No - Patient declined No - Patient declined No - Patient declined No - Patient declined  Pre-existing out of facility DNR order (yellow form or pink MOST form) - - - - - - -   Patient states that her a her husband have spoke about this Tobacco Social History    Tobacco Use  Smoking Status Never Smoker  Smokeless Tobacco Never Used     Patient has never been a smoker  Clinical Intake:  Pre-visit preparation completed: No  Pain : No/denies pain     BMI - recorded: 37.34 Nutritional Status: BMI > 30  Obese Nutritional Risks: None Diabetes: No  How often do you need to have someone help you when you read instructions, pamphlets, or other written materials from your doctor or pharmacy?: 1 - Never  Interpreter Needed?: No     Past Medical History:  Diagnosis Date  . Allergic urticaria 01/04/2011   Rash from tape.  . Anxiety   . Cellulitis of left leg 2006  . Clotting disorder ([place])    heterozygosity from factor v leiden  . DVT (deep venous thrombosis) ([place]) 07/10/2010   on coumadin  . Dyspnea    with exertion  . Factor V Leiden ([place])   . GERD (gastroesophageal reflux disease)   . GIST (gastrointestinal stromal tumor), malignant ([place]) 01/03/2011   S/P resection on 01/05/11.  Intolerant to [employer]. Skin Cancer Left hand  . History of blood transfusion   . Hypertension   . Pernicious anemia 07/10/2010  . Small bowel mass 01/03/2011   s/p surgery  . Ulcer 05/2009   esophageal  . Ventricular tachycardia ([place]) 03/26/11  .  Vitamin B12 deficiency    vit b12 1000 mcg monthly   Past Surgical History:  Procedure Laterality Date  . ABDOMINAL HYSTERECTOMY  1989  . BALLOON DILATION  12/11/2010   Procedure: BALLOON DILATION;  Surgeon: Rogene Houston, MD;  [facility];  Service: Endoscopy;  Laterality: N/A;  . BOWEL RESECTION  01/05/2011   Procedure: SMALL BOWEL RESECTION;  Surgeon: Jamesetta So;  Location: [facility];  Service: General;;  Partial Small Bowel Resection  . COLONOSCOPY  02/25/2012   Procedure: COLONOSCOPY;  Surgeon: Rogene Houston, MD;  [facility];  Service: Endoscopy;  Laterality: N/A;  1200  . GIVENS CAPSULE STUDY  01/02/2011   Procedure: GIVENS CAPSULE STUDY;  Surgeon: Rogene Houston, MD;   [facility];  Service: Endoscopy;  Laterality: N/A;  . LAPAROTOMY  01/05/2011   Procedure: EXPLORATORY LAPAROTOMY;  Surgeon: Jamesetta So;  Location: [facility];  Service: General;  Laterality: N/A;  . OPEN REDUCTION INTERNAL FIXATION (ORIF) SCAPHOID WITH DISTAL RADIUS GRAFT Right 02/01/2016   Procedure: Right distal radius open reduction and internal fixation and repair as indicated;  Surgeon: Iran Planas, MD;  Location: [facility];  Service: Orthopedics;  Laterality: Right;  Requests 90 mins   Family History  Problem Relation Age of Onset  . Depression Mother   . Parkinson's disease Mother    Social History   Socioeconomic History  . Marital status: Married    Spouse name: Jeneen Rinks  . Number of children: 3  . Years of education: None  . Highest education level: None  Social Needs  . Financial resource strain: Not hard at all  . Food insecurity - worry: Never true  . Food insecurity - inability: Never true  . Transportation needs - medical: No  . Transportation needs - non-medical: No  Occupational History    Employer: [employer]  . Occupation: CNA    Employer: [employer]  Tobacco Use  . Smoking status: Never Smoker  . Smokeless tobacco: Never Used  Substance and Sexual Activity  . Alcohol use: No  . Drug use: No  . Sexual activity: Not Currently  Other Topics Concern  . None  Social History Narrative  . None    Outpatient Encounter Medications as of 04/07/2017  Medication Sig  . acetaminophen (TYLENOL) 500 MG tablet Take 500-1,000 mg by mouth every 6 (six) hours as needed for moderate pain.  . cyanocobalamin (,VITAMIN B-12,) 1000 MCG/ML injection INJECT 1ML IM EVERY 30 DAYS  . diphenhydrAMINE (BENADRYL) 25 mg capsule Take 25 mg by mouth every 6 (six) hours as needed for allergies (bee stings).   . metoprolol tartrate (LOPRESSOR) 25 MG tablet Take 0.5 tablets (12.5 mg total) by mouth 2 (two) times daily.  Marland Kitchen omeprazole (PRILOSEC) 20 MG capsule Take 20 mg by mouth  daily.  Alveda Reasons 20 MG TABS tablet Take 1 tablet (20 mg total) by mouth daily with supper.  . [DISCONTINUED] sertraline (ZOLOFT) 25 MG tablet Take 1 tablet by mouth daily for two week then take two tablets daily for 2 weeks.   No facility-administered encounter medications on file as of 04/07/2017.     Activities of Daily Living In your present state of health, do you have any difficulty performing the following activities: 04/07/2017  Hearing? N  Vision? Y  Comment Has reading glasses  Difficulty concentrating or making decisions? N  Walking or climbing stairs? N  Dressing or bathing? N  Doing errands, shopping? N  Preparing Food and eating ? N  Using the Toilet? N  In the past six months, have you accidently leaked urine? N  Do you have problems with loss of bowel control? N  Managing your Medications? N  Managing your Finances? N  Housekeeping or managing your Housekeeping? N  Some recent data might be hidden    Patient Care Team: [name], MD as PCP - General (Pediatrics) Sheldon Silvan Scharlene Corn as Physician Assistant (Oncology)    Assessment:   This is a routine wellness examination for [place].  Exercise Activities and Dietary recommendations Current Exercise Habits: The patient does not participate in regular exercise at present, Exercise limited by: None identified  Goals    . Exercise 150 min/wk Moderate Activity    . Weight < 150 lb (68.04 kg)       Fall Risk Fall Risk  04/13/2017 03/26/2017 02/10/2017 10/04/2015 03/01/2015  Falls in the past year? No No No No No   Is the patient's home free of loose throw rugs in walkways, pet beds, electrical cords, etc?   yes      Grab bars in the bathroom? no      Handrails on the stairs?   yes      Adequate lighting?   yes    Depression Screen PHQ 2/9 Scores 04/13/2017 03/26/2017 02/10/2017 10/04/2015  PHQ - 2 Score 1 2 0 0  PHQ- 9 Score 4 10 - -    Patient had a son to pass away and still has trouble dealing with it.  She states that it comes and goes and is worse during holidays  Cognitive Function MMSE - Mini Mental State Exam 04/13/2017 11/19/2014  Orientation to time 5 5  Orientation to Place 5 5  Registration 3 3  Attention/ Calculation 5 5  Recall 3 3  Language- name 2 objects 2 2  Language- repeat 1 1  Language- follow 3 step command 3 3  Language- read & follow direction 1 1  Write a sentence 1 1  Copy design 1 1  Total score 30 30      Patient completed the MMSE with no problem   There is no immunization history on file for this patient. Patient refuses any update on immunizations  Screening Tests Health Maintenance  Topic Date Due  . HIV Screening  04/16/1966  . PAP SMEAR  04/15/1972  . DEXA SCAN  04/15/2016  . PNA vac Low Risk Adult (1 of 2 - PCV13) 04/15/2016  . INFLUENZA VACCINE  10/13/2017 (Originally 08/26/2016)  . MAMMOGRAM  10/06/2018  . TETANUS/TDAP  11/23/2019  . COLONOSCOPY  02/24/2022  . Hepatitis C Screening  Completed    Cancer Screenings: Lung: Low Dose CT Chest recommended if Age 43-80 years, 30 pack-year currently smoking OR have quit w/in 15years. Patient does not qualify. Breast:  Up to date on Mammogram? Yes   Up to date of Bone Density/Dexa? No   Patient refuses a dexascan Colorectal: Patient is up to date on Colonoscopy.  Additional Screenings:  Hepatitis B/HIV/Syphillis: Hepatitis C Screening:   Patient will discuss labwork with provider at next follow up appt     Plan:     I have personally reviewed and noted the following in the patient's chart:   . Medical and social history . Use of alcohol, tobacco or illicit drugs  . Current medications and supplements . Functional ability and status . Nutritional status . Physical activity . Advanced directives . List of  other physicians . Hospitalizations, surgeries, and ER visits in previous 12 months . Vitals . Screenings to include cognitive, depression, and falls . Referrals and  appointments  In addition, I have reviewed and discussed with patient certain preventive protocols, quality metrics, and best practice recommendations. A written personalized care plan for preventive services as well as general preventive health recommendations were provided to patient.     Rolena Infante, LPN  7/34/2876  I have reviewed and agree with the above AWV documentation.   Assunta Found, MD [facility] 04/15/2017, 9:06 AM

## 2017-04-23 ENCOUNTER — Ambulatory Visit: Payer: PPO | Admitting: Pediatrics

## 2017-04-23 ENCOUNTER — Telehealth: Payer: Self-pay

## 2017-04-23 NOTE — Telephone Encounter (Signed)
[facility] left a voice mail message   

## 2017-04-27 ENCOUNTER — Telehealth: Payer: Self-pay

## 2017-04-27 NOTE — Telephone Encounter (Signed)
Insurance denied prior auth for Clobetasol cream  Alternatives are  Alclometasone dipropionate cream/ointment Betamethasone dipropionate aug cream/ointment Betamethasone Dipropionate cream ointment BetametasoneValerate cream/ointment Fluocinolone Acetonide cream

## 2017-04-28 NOTE — Telephone Encounter (Signed)
Needs to be seen for this refill. What is it for and where does she use it?

## 2017-04-28 NOTE — Telephone Encounter (Signed)
Left message, patient needs a follow up appointment to have refill on medicated creams.

## 2017-04-28 NOTE — Telephone Encounter (Signed)
lmtcb

## 2017-05-18 ENCOUNTER — Telehealth: Payer: Self-pay

## 2017-05-18 NOTE — Telephone Encounter (Signed)
[facility] - Writer left a voice mail message on 4-11 and 05-18-2017

## 2017-05-20 ENCOUNTER — Telehealth (HOSPITAL_COMMUNITY): Payer: Self-pay

## 2017-05-20 DIAGNOSIS — I82402 Acute embolism and thrombosis of unspecified deep veins of left lower extremity: Secondary | ICD-10-CM

## 2017-05-20 DIAGNOSIS — Z7901 Long term (current) use of anticoagulants: Secondary | ICD-10-CM

## 2017-05-20 MED ORDER — XARELTO 20 MG PO TABS: 20.0000 mg | ORAL_TABLET | Freq: Every day | ORAL | 11 refills | Status: DC

## 2017-05-20 NOTE — Telephone Encounter (Signed)
Received call from patient. She is changing drug stores and needs a new prescription for Xarelto to go to [facility] in [place]. Reviewed with provider, chart checked and new prescription sent to pharmacy.

## 2017-05-26 ENCOUNTER — Encounter (INDEPENDENT_AMBULATORY_CARE_PROVIDER_SITE_OTHER): Payer: Self-pay | Admitting: Orthopaedic Surgery

## 2017-05-26 ENCOUNTER — Ambulatory Visit (INDEPENDENT_AMBULATORY_CARE_PROVIDER_SITE_OTHER): Payer: PPO | Admitting: Orthopaedic Surgery

## 2017-05-26 VITALS — BP 138/81 | HR 60 | Resp 18 | Ht 67.5 in | Wt 235.0 lb

## 2017-05-26 DIAGNOSIS — M1712 Unilateral primary osteoarthritis, left knee: Secondary | ICD-10-CM | POA: Diagnosis not present

## 2017-05-26 MED ORDER — BUPIVACAINE HCL 0.5 % IJ SOLN
2.0000 mL | INTRAMUSCULAR | Status: AC | PRN
  Administered 2017-05-26: 2 mL via INTRA_ARTICULAR

## 2017-05-26 MED ORDER — LIDOCAINE HCL 1 % IJ SOLN
2.0000 mL | INTRAMUSCULAR | Status: AC | PRN
  Administered 2017-05-26: 2 mL

## 2017-05-26 MED ORDER — METHYLPREDNISOLONE ACETATE 40 MG/ML IJ SUSP
80.0000 mg | INTRAMUSCULAR | Status: AC | PRN
  Administered 2017-05-26: 80 mg

## 2017-05-26 NOTE — Progress Notes (Signed)
Office Visit Note   Patient: [name]           Date of Birth: [birth year]           MRN: [id number] Visit Date: 05/26/2017              Requested by: [name], MD [place], [place] [address] PCP: [name], MD   Assessment & Plan: Visit Diagnoses:  1. Unilateral primary osteoarthritis, left knee     Plan: Recurrent symptoms of osteoarthritis left knee.  Will reinject with cortisone and see back as needed  Follow-Up Instructions: No follow-ups on file.   Orders:  Orders Placed This Encounter  Procedures  . Large Joint Inj: L knee   No orders of the defined types were placed in this encounter.     Procedures: Large Joint Inj: L knee on 05/26/2017 11:45 AM Indications: pain and diagnostic evaluation Details: 25 G 1.5 in needle, anteromedial approach  Arthrogram: No  Medications: 2 mL bupivacaine 0.5 %; 2 mL lidocaine 1 %; 80 mg methylPREDNISolone acetate 40 MG/ML Procedure, treatment alternatives, risks and benefits explained, specific risks discussed. Consent was given by the patient. Patient was prepped and draped in the usual sterile fashion.       Clinical Data: No additional findings.   Subjective: Chief Complaint  Patient presents with  . Left Knee - Pain  . Follow-up    last inj. was sept 2018 would like another one   Prior diagnosis of osteoarthritis left knee all 3 compartments but particularly in the medial compartment where there was narrowing and increased varus.  Has had prior cortisone injection with good relief.  Factor V Leiden and is on Xarelto has developed chronic venous stasis ulcers along the medial aspect of her distal left leg being followed by the wound center HPI  Review of Systems  Constitutional: Negative for fatigue and fever.  HENT: Negative for ear pain.   Eyes: Negative for pain.  Respiratory: Negative for cough and shortness of breath.   Cardiovascular: Positive for leg swelling.    Gastrointestinal: Negative for constipation and diarrhea.  Genitourinary: Negative for difficulty urinating.  Musculoskeletal: Negative for back pain and neck pain.  Skin: Positive for rash.  Allergic/Immunologic: Positive for food allergies.  Neurological: Negative for weakness and numbness.  Hematological: Bruises/bleeds easily.  Psychiatric/Behavioral: Positive for sleep disturbance.     Objective: Vital Signs: BP 138/81 (BP Location: Left Arm, Patient Position: Sitting, Cuff Size: Normal)   Pulse 60   Resp 18   Ht 5' 7.5" (1.715 m)   Wt 235 lb (106.6 kg)   BMI 36.26 kg/m   Physical Exam  Constitutional: She is oriented to person, place, and time. She appears well-developed and well-nourished.  HENT:  Mouth/Throat: Oropharynx is clear and moist.  Eyes: Pupils are equal, round, and reactive to light. EOM are normal.  Pulmonary/Chest: Effort normal.  Neurological: She is alert and oriented to person, place, and time.  Skin: Skin is warm and dry.  Psychiatric: She has a normal mood and affect. Her behavior is normal.    Ortho Exam and oriented x3.  Comfortable sitting.  Minimal effusion left knee.  Lacks just a few degrees of full extension.  Predominantly medial joint pain.  Some patellar crepitation.  No lateral joint pain.  No calf discomfort.  Venous stasis ulcers along the medial aspect of her distal leg that are presently covered.  Varicosities identified in her left calf  which are nontender  Specialty Comments:  No specialty comments available.  Imaging: No results found.   PMFS History: Patient Active Problem List   Diagnosis Date Noted  . Unilateral primary osteoarthritis, left knee 05/26/2017  . Depression 11/19/2014  . Hypotension, iatrogenic 03/26/2011  . Lip swelling 01/03/2011  . GIST (gastrointestinal stromal tumor), malignant ([place]) 01/03/2011  . Chronic ulcer of left leg ([place]) 01/01/2011  . Chronic anticoagulation 01/01/2011  . Factor V Leiden ([place])    . DVT (deep venous thrombosis) ([place]) 07/10/2010  . Pernicious anemia 07/10/2010  . Iron deficiency anemia 08/27/2009  . WEIGHT GAIN 08/27/2009  . Reflux esophagitis 07/05/2009  . GI BLEEDING 06/11/2009   Past Medical History:  Diagnosis Date  . Allergic urticaria 01/04/2011   Rash from tape.  . Anxiety   . Cellulitis of left leg 2006  . Clotting disorder ([place])    heterozygosity from factor v leiden  . DVT (deep venous thrombosis) ([place]) 07/10/2010   on coumadin  . Dyspnea    with exertion  . Factor V Leiden ([place])   . GERD (gastroesophageal reflux disease)   . GIST (gastrointestinal stromal tumor), malignant ([place]) 01/03/2011   S/P resection on 01/05/11.  Intolerant to [employer]. Skin Cancer Left hand  . History of blood transfusion   . Hypertension   . Pernicious anemia 07/10/2010  . Small bowel mass 01/03/2011   s/p surgery  . Ulcer 05/2009   esophageal  . Ventricular tachycardia ([place]) 03/26/11  . Vitamin B12 deficiency    vit b12 1000 mcg monthly    Family History  Problem Relation Age of Onset  . Depression Mother   . Parkinson's disease Mother     Past Surgical History:  Procedure Laterality Date  . ABDOMINAL HYSTERECTOMY  1989  . BALLOON DILATION  12/11/2010   Procedure: BALLOON DILATION;  Surgeon: Rogene Houston, MD;  [facility];  Service: Endoscopy;  Laterality: N/A;  . BOWEL RESECTION  01/05/2011   Procedure: SMALL BOWEL RESECTION;  Surgeon: Jamesetta So;  Location[facility];  Service: General;;  Partial Small Bowel Resection  . COLONOSCOPY  02/25/2012   Procedure: COLONOSCOPY;  Surgeon: Rogene Houston, MD;  [facility];  Service: Endoscopy;  Laterality: N/A;  1200  . GIVENS CAPSULE STUDY  01/02/2011   Procedure: GIVENS CAPSULE STUDY;  Surgeon: Rogene Houston, MD;  [facility];  Service: Endoscopy;  Laterality: N/A;  . LAPAROTOMY  01/05/2011   Procedure: EXPLORATORY LAPAROTOMY;  Surgeon: Jamesetta So;  Location[facility];   Service: General;  Laterality: N/A;  . OPEN REDUCTION INTERNAL FIXATION (ORIF) SCAPHOID WITH DISTAL RADIUS GRAFT Right 02/01/2016   Procedure: Right distal radius open reduction and internal fixation and repair as indicated;  Surgeon: Iran Planas, MD;  Location: [facility];  Service: Orthopedics;  Laterality: Right;  Requests 90 mins   Social History   Occupational History    Employer: [employer]  . Occupation: CNA    Employer: [employer]  Tobacco Use  . Smoking status: Never Smoker  . Smokeless tobacco: Never Used  Substance and Sexual Activity  . Alcohol use: No  . Drug use: No  . Sexual activity: Not Currently

## 2017-06-08 ENCOUNTER — Telehealth: Payer: Self-pay | Admitting: Clinical

## 2017-06-08 NOTE — Telephone Encounter (Signed)
This [facility] specialist introduced herself and offered services to support [name]  [name] reported she's feeling better and declined [facility] services at this time.  [name] reported that she never started taking the sertraline (Zoloft) 25 mg since she was too worried about the side effects.  [name] was informed that if she changes her mind about having [facility] services then she can call and talk to [facility] team. [name] was given the direct number for [facility] [phone].  Plan:  [facility] referral will be closed at this time since patient declined services.  [facility] team will be available if needed in the future.

## 2017-06-14 ENCOUNTER — Telehealth: Payer: Self-pay

## 2017-06-14 NOTE — Telephone Encounter (Signed)
Wants a referral back to [facility]

## 2017-06-14 NOTE — Telephone Encounter (Signed)
Needs to be seen, as this is same ulcer from January?  I can see her this evening or afternoon.

## 2017-06-14 NOTE — Telephone Encounter (Signed)
Appt given for this Friday

## 2017-06-17 DIAGNOSIS — L97302 Non-pressure chronic ulcer of unspecified ankle with fat layer exposed: Secondary | ICD-10-CM | POA: Diagnosis not present

## 2017-06-17 DIAGNOSIS — I83003 Varicose veins of unspecified lower extremity with ulcer of ankle: Secondary | ICD-10-CM | POA: Diagnosis not present

## 2017-06-18 ENCOUNTER — Ambulatory Visit: Payer: PPO | Admitting: Pediatrics

## 2017-07-06 DIAGNOSIS — L03116 Cellulitis of left lower limb: Secondary | ICD-10-CM | POA: Diagnosis not present

## 2017-07-09 ENCOUNTER — Inpatient Hospital Stay (HOSPITAL_COMMUNITY): Payer: PPO | Attending: Hematology

## 2017-07-09 DIAGNOSIS — R0602 Shortness of breath: Secondary | ICD-10-CM | POA: Diagnosis not present

## 2017-07-09 DIAGNOSIS — Z7901 Long term (current) use of anticoagulants: Secondary | ICD-10-CM | POA: Diagnosis not present

## 2017-07-09 DIAGNOSIS — I10 Essential (primary) hypertension: Secondary | ICD-10-CM | POA: Diagnosis not present

## 2017-07-09 DIAGNOSIS — E8809 Other disorders of plasma-protein metabolism, not elsewhere classified: Secondary | ICD-10-CM | POA: Diagnosis not present

## 2017-07-09 DIAGNOSIS — L97922 Non-pressure chronic ulcer of unspecified part of left lower leg with fat layer exposed: Secondary | ICD-10-CM | POA: Insufficient documentation

## 2017-07-09 DIAGNOSIS — I82522 Chronic embolism and thrombosis of left iliac vein: Secondary | ICD-10-CM | POA: Insufficient documentation

## 2017-07-09 DIAGNOSIS — D6851 Activated protein C resistance: Secondary | ICD-10-CM | POA: Diagnosis not present

## 2017-07-09 DIAGNOSIS — C49A3 Gastrointestinal stromal tumor of small intestine: Secondary | ICD-10-CM

## 2017-07-09 DIAGNOSIS — K219 Gastro-esophageal reflux disease without esophagitis: Secondary | ICD-10-CM | POA: Diagnosis not present

## 2017-07-09 DIAGNOSIS — I472 Ventricular tachycardia: Secondary | ICD-10-CM | POA: Insufficient documentation

## 2017-07-09 DIAGNOSIS — Z8719 Personal history of other diseases of the digestive system: Secondary | ICD-10-CM | POA: Diagnosis not present

## 2017-07-09 DIAGNOSIS — D510 Vitamin B12 deficiency anemia due to intrinsic factor deficiency: Secondary | ICD-10-CM | POA: Diagnosis not present

## 2017-07-09 DIAGNOSIS — E538 Deficiency of other specified B group vitamins: Secondary | ICD-10-CM | POA: Insufficient documentation

## 2017-07-09 LAB — CBC WITH DIFFERENTIAL/PLATELET
Basophils Absolute: 0 10*3/uL (ref 0.0–0.1)
Basophils Relative: 0 %
EOS PCT: 2 %
Eosinophils Absolute: 0.1 10*3/uL (ref 0.0–0.7)
HEMATOCRIT: 42.4 % (ref 36.0–46.0)
Hemoglobin: 13.5 g/dL (ref 12.0–15.0)
LYMPHS ABS: 2.2 10*3/uL (ref 0.7–4.0)
LYMPHS PCT: 37 %
MCH: 28.7 pg (ref 26.0–34.0)
MCHC: 31.8 g/dL (ref 30.0–36.0)
MCV: 90 fL (ref 78.0–100.0)
MONO ABS: 0.5 10*3/uL (ref 0.1–1.0)
Monocytes Relative: 9 %
NEUTROS ABS: 3.1 10*3/uL (ref 1.7–7.7)
Neutrophils Relative %: 52 %
PLATELETS: 250 10*3/uL (ref 150–400)
RBC: 4.71 MIL/uL (ref 3.87–5.11)
RDW: 13.7 % (ref 11.5–15.5)
WBC: 6 10*3/uL (ref 4.0–10.5)

## 2017-07-09 LAB — IRON AND TIBC
IRON: 67 ug/dL (ref 28–170)
Saturation Ratios: 23 % (ref 10.4–31.8)
TIBC: 287 ug/dL (ref 250–450)
UIBC: 220 ug/dL

## 2017-07-09 LAB — FERRITIN: FERRITIN: 78 ng/mL (ref 11–307)

## 2017-07-09 LAB — COMPREHENSIVE METABOLIC PANEL
ALT: 38 U/L (ref 14–54)
AST: 39 U/L (ref 15–41)
Albumin: 3.7 g/dL (ref 3.5–5.0)
Alkaline Phosphatase: 75 U/L (ref 38–126)
Anion gap: 6 (ref 5–15)
BILIRUBIN TOTAL: 0.6 mg/dL (ref 0.3–1.2)
BUN: 11 mg/dL (ref 6–20)
CHLORIDE: 109 mmol/L (ref 101–111)
CO2: 25 mmol/L (ref 22–32)
Calcium: 9.4 mg/dL (ref 8.9–10.3)
Creatinine, Ser: 0.71 mg/dL (ref 0.44–1.00)
Glucose, Bld: 90 mg/dL (ref 65–99)
POTASSIUM: 3.9 mmol/L (ref 3.5–5.1)
Sodium: 140 mmol/L (ref 135–145)
TOTAL PROTEIN: 7.5 g/dL (ref 6.5–8.1)

## 2017-07-09 LAB — VITAMIN B12: Vitamin B-12: 344 pg/mL (ref 180–914)

## 2017-07-12 ENCOUNTER — Encounter (HOSPITAL_BASED_OUTPATIENT_CLINIC_OR_DEPARTMENT_OTHER): Payer: PPO | Attending: Internal Medicine

## 2017-07-12 DIAGNOSIS — D6851 Activated protein C resistance: Secondary | ICD-10-CM | POA: Insufficient documentation

## 2017-07-12 DIAGNOSIS — Z7901 Long term (current) use of anticoagulants: Secondary | ICD-10-CM | POA: Diagnosis not present

## 2017-07-12 DIAGNOSIS — I10 Essential (primary) hypertension: Secondary | ICD-10-CM | POA: Diagnosis not present

## 2017-07-12 DIAGNOSIS — L97822 Non-pressure chronic ulcer of other part of left lower leg with fat layer exposed: Secondary | ICD-10-CM | POA: Insufficient documentation

## 2017-07-12 DIAGNOSIS — Z91048 Other nonmedicinal substance allergy status: Secondary | ICD-10-CM | POA: Insufficient documentation

## 2017-07-12 DIAGNOSIS — Z9221 Personal history of antineoplastic chemotherapy: Secondary | ICD-10-CM | POA: Diagnosis not present

## 2017-07-12 DIAGNOSIS — Z86718 Personal history of other venous thrombosis and embolism: Secondary | ICD-10-CM | POA: Diagnosis not present

## 2017-07-12 DIAGNOSIS — I87332 Chronic venous hypertension (idiopathic) with ulcer and inflammation of left lower extremity: Secondary | ICD-10-CM | POA: Insufficient documentation

## 2017-07-12 LAB — METHYLMALONIC ACID, SERUM: Methylmalonic Acid, Quantitative: 111 nmol/L (ref 0–378)

## 2017-07-16 ENCOUNTER — Other Ambulatory Visit: Payer: Self-pay

## 2017-07-16 ENCOUNTER — Encounter (HOSPITAL_COMMUNITY): Payer: Self-pay | Admitting: Oncology

## 2017-07-16 ENCOUNTER — Inpatient Hospital Stay (HOSPITAL_BASED_OUTPATIENT_CLINIC_OR_DEPARTMENT_OTHER): Payer: PPO | Admitting: Oncology

## 2017-07-16 ENCOUNTER — Ambulatory Visit (HOSPITAL_COMMUNITY): Payer: PPO | Admitting: Hematology

## 2017-07-16 VITALS — BP 154/72 | HR 69 | Temp 97.5°F | Resp 18 | Wt 246.1 lb

## 2017-07-16 DIAGNOSIS — Z510 Encounter for antineoplastic radiation therapy: Secondary | ICD-10-CM | POA: Diagnosis not present

## 2017-07-16 DIAGNOSIS — E8809 Other disorders of plasma-protein metabolism, not elsewhere classified: Secondary | ICD-10-CM

## 2017-07-16 DIAGNOSIS — Z8719 Personal history of other diseases of the digestive system: Secondary | ICD-10-CM | POA: Diagnosis not present

## 2017-07-16 DIAGNOSIS — Z7901 Long term (current) use of anticoagulants: Secondary | ICD-10-CM | POA: Diagnosis not present

## 2017-07-16 DIAGNOSIS — E538 Deficiency of other specified B group vitamins: Secondary | ICD-10-CM | POA: Diagnosis not present

## 2017-07-16 DIAGNOSIS — I10 Essential (primary) hypertension: Secondary | ICD-10-CM | POA: Diagnosis not present

## 2017-07-16 DIAGNOSIS — I472 Ventricular tachycardia: Secondary | ICD-10-CM

## 2017-07-16 DIAGNOSIS — L97922 Non-pressure chronic ulcer of unspecified part of left lower leg with fat layer exposed: Secondary | ICD-10-CM

## 2017-07-16 DIAGNOSIS — I87332 Chronic venous hypertension (idiopathic) with ulcer and inflammation of left lower extremity: Secondary | ICD-10-CM | POA: Diagnosis not present

## 2017-07-16 DIAGNOSIS — C49A3 Gastrointestinal stromal tumor of small intestine: Secondary | ICD-10-CM

## 2017-07-16 DIAGNOSIS — D6851 Activated protein C resistance: Secondary | ICD-10-CM

## 2017-07-16 DIAGNOSIS — K219 Gastro-esophageal reflux disease without esophagitis: Secondary | ICD-10-CM | POA: Diagnosis not present

## 2017-07-16 DIAGNOSIS — D510 Vitamin B12 deficiency anemia due to intrinsic factor deficiency: Secondary | ICD-10-CM

## 2017-07-16 DIAGNOSIS — R0602 Shortness of breath: Secondary | ICD-10-CM

## 2017-07-16 DIAGNOSIS — I82522 Chronic embolism and thrombosis of left iliac vein: Secondary | ICD-10-CM

## 2017-07-16 DIAGNOSIS — L97322 Non-pressure chronic ulcer of left ankle with fat layer exposed: Secondary | ICD-10-CM | POA: Diagnosis not present

## 2017-07-16 DIAGNOSIS — L97222 Non-pressure chronic ulcer of left calf with fat layer exposed: Secondary | ICD-10-CM | POA: Diagnosis not present

## 2017-07-16 NOTE — Assessment & Plan Note (Addendum)
Factor V Leiden heterozygosity with recurrent DVT, resulting in recommendation for lifelong anticoagulation.  Heterozygous factor V Leiden by itself is not a strong thrombophilia and does not typically require lifelong anticoagulation, but given her history of recurrent DVT, lifelong anticoagulation is strongly recommended.  She was previously on vitamin K antagonist therapy (Coumadin) but given the inconvenience of repeated INR checks, the patient opted to switch to NOAC and therefore Xarelto started on 09/14/2016.  Labs on 07/09/2017: CBC diff, CMET, iron/TIBC, ferritin, B12, MMA.  I personally reviewed and went over laboratory results with the patient.  The results are noted within this dictation.  At that time, laboratory work demonstrated white blood cell count of 6.0 with a normal differential, normal hemoglobin 13.5 g/dL, and platelet count 50,000.  Metabolic panel was unimpressive.  Iron studies were within normal limits, but noted ferritin to be less than 100 with normal iron studies otherwise.  Again, hemoglobin within normal limits.  12 methylmalonic acid were also completed and within normal limits.  Hyperproteinemia has resolved but remains at the upper limits normal.  We will need to monitor moving forward.  If she becomes hypoproteinemic in the future, we will need to consider screening for multiple myeloma.   Next screening mammogram is due in August 2019.  Return in 6 months for follow-up.

## 2017-07-16 NOTE — Progress Notes (Signed)
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  Heterozygous factor V Leiden by itself is not a strong thrombophilia and does not require lifelong anticoagulation, BUT given her history of RECURRENT DVT, she will be maintained on lifelong anticoagulation. AND GIST, Stage II, S/P resection by Dr. Arnoldo Morale on 01/05/2011.  NED  CURRENT THERAPY: On lifelong anticoagulation with Xarelto  CURRENT STATUS: [name] 67 y.o. female returns for followup of in follow-up of factor V Leiden, heterozygous, on lifelong anticoagulation and history of GIST status post resection in 2012 without evidence of recurrence of disease.  She is doing very well from a hematologic and oncologic perspective.  She denies any new lumps or bumps on her own examination.  Her bowels are working and she denies any stool complaints.  She denies any blood in her stools or black stools.  She denies any gross hematuria or hemoptysis.  Her appetite is good and her weight is stable.  She denies any new pain.  She denies any signs or symptoms suggestive of venous thromboembolism.  She is seeing wound clinic for recurrent left lower extremity skin ulcer.  It is cleanly dressed today.  She reports an episode of a "panic attack."  This occurred when she  was [place] in [place].  She notes that the road she had to drive on was a clip on one side and she reports that she experienced an panic attack at that point time.  I managed it in pursuing this further at this point time.  Review of Systems  Constitutional: Negative.  Negative for chills, fever and weight loss.  HENT: Negative.   Eyes: Negative.   Respiratory: Negative.  Negative for cough.   Cardiovascular: Negative.  Negative for chest pain.  Gastrointestinal: Negative.  Negative for blood in stool, constipation, diarrhea, melena, nausea and vomiting.  Genitourinary: Negative.   Musculoskeletal: Negative.   Skin: Negative.        Left lower extremity nonhealing ulcer  Neurological: Negative.  Negative for weakness.  Endo/Heme/Allergies: Negative.   Psychiatric/Behavioral: Negative.     Past Medical History:  Diagnosis Date  . Allergic urticaria 01/04/2011   Rash from tape.  . Anxiety   . Cellulitis of left leg 2006  . Clotting disorder ([place])    heterozygosity from factor v leiden  . DVT (deep venous thrombosis) ([place]) 07/10/2010   on coumadin  . Dyspnea    with exertion  . Factor V Leiden ([place])   . GERD (gastroesophageal reflux disease)   . GIST (gastrointestinal stromal tumor), malignant ([place]) 01/03/2011   S/P resection on 01/05/11.  Intolerant to [employer]. Skin Cancer Left hand  . History of blood transfusion   . Hypertension   . Pernicious anemia 07/10/2010  . Small bowel mass 01/03/2011   s/p surgery  . Ulcer 05/2009  esophageal  . Ventricular tachycardia ([place]) 03/26/11  . Vitamin B12 deficiency    vit b12 1000 mcg monthly    Past Surgical History:  Procedure Laterality Date  . ABDOMINAL HYSTERECTOMY  1989  . BALLOON DILATION  12/11/2010   Procedure: BALLOON DILATION;  Surgeon: Rogene Houston, MD;  [facility];  Service: Endoscopy;  Laterality: N/A;  . BOWEL RESECTION  01/05/2011   Procedure: SMALL BOWEL RESECTION;  Surgeon: Jamesetta So;  Location: [facility];  Service: General;;  Partial Small Bowel Resection  . COLONOSCOPY  02/25/2012   Procedure: COLONOSCOPY;  Surgeon: Rogene Houston, MD;  [facility];  Service: Endoscopy;  Laterality: N/A;  1200  . GIVENS CAPSULE STUDY  01/02/2011   Procedure: GIVENS CAPSULE STUDY;  Surgeon: Rogene Houston, MD;  [facility];  Service: Endoscopy;  Laterality: N/A;  . LAPAROTOMY  01/05/2011   Procedure: EXPLORATORY LAPAROTOMY;  Surgeon: Jamesetta So;  Location: [facility];  Service: General;  Laterality: N/A;  . OPEN REDUCTION INTERNAL FIXATION (ORIF) SCAPHOID WITH DISTAL RADIUS GRAFT Right 02/01/2016   Procedure: Right distal radius open reduction and internal fixation and repair as indicated;  Surgeon: Iran Planas, MD;  Location: [place];  Service: Orthopedics;  Laterality: Right;  Requests 90 mins    Family History  Problem Relation Age of Onset  . Depression Mother   . Parkinson's disease Mother     Social History   Socioeconomic History  . Marital status: Married    Spouse name: Jeneen Rinks  . Number of children: 3  . Years of education: Not on file  . Highest education level: Not on file  Occupational History    Employer: [employer]  . Occupation: Forensic psychologist: [employer]  Social Needs  . Financial resource strain: Not hard at all  . Food insecurity:    Worry: Never true    Inability: Never true  . Transportation needs:    Medical: No    Non-medical: No  Tobacco Use  . Smoking status: Never Smoker  . Smokeless tobacco: Never Used  Substance and Sexual Activity  . Alcohol use: No  . Drug use: No  . Sexual activity: Not Currently  Lifestyle  . Physical activity:    Days per week: 3 days    Minutes per session: 30 min  . Stress: Not at all  Relationships  . Social connections:    Talks on phone: More than three times a week    Gets together: More than three times a week    Attends religious service: More than 4 times per year     Active member of club or organization: No    Attends meetings of clubs or organizations: Never    Relationship status: Married  Other Topics Concern  . Not on file  Social History Narrative  . Not on file     PHYSICAL EXAMINATION  ECOG PERFORMANCE STATUS: 1 - Symptomatic but completely ambulatory  Vitals:   07/16/17 1306  BP: (!) 154/72  Pulse: 69  Resp: 18  Temp: (!) 97.5 F (36.4 C)  SpO2: 99%    GENERAL:alert, no distress, well nourished, well developed, comfortable, cooperative, obese, smiling and unaccompanied SKIN: skin color, texture, turgor are normal, positive for: Left lower extremity ulcer  cleanly wrapped  HEAD: Normocephalic, No masses, lesions, tenderness or abnormalities EYES: normal, EOMI, Conjunctiva are pink and non-injected EARS: External ears normal OROPHARYNX:lips, buccal mucosa, and tongue  normal and mucous membranes are moist  NECK: supple, no adenopathy, thyroid normal size, non-tender, without nodularity, trachea midline LYMPH:  no palpable lymphadenopathy, no hepatosplenomegaly BREAST:not examined LUNGS: clear to auscultation and percussion HEART: regular rate & rhythm, no murmurs, no gallops, S1 normal and S2 normal ABDOMEN:abdomen soft, non-tender, obese, normal bowel sounds and no masses or organomegaly BACK: Back symmetric, no curvature. EXTREMITIES:less then 2 second capillary refill, no joint deformities, effusion, or inflammation, no skin discoloration, no cyanosis  NEURO: alert & oriented x 3 with fluent speech, no focal motor/sensory deficits, gait normal   LABORATORY DATA: CBC    Component Value Date/Time   WBC 6.0 07/09/2017 0922   RBC 4.71 07/09/2017 0922   HGB 13.5 07/09/2017 0922   HCT 42.4 07/09/2017 0922   PLT 250 07/09/2017 0922   MCV 90.0 07/09/2017 0922   MCH 28.7 07/09/2017 0922   MCHC 31.8 07/09/2017 0922   RDW 13.7 07/09/2017 0922   LYMPHSABS 2.2 07/09/2017 0922   MONOABS 0.5 07/09/2017 0922   EOSABS 0.1 07/09/2017  0922   BASOSABS 0.0 07/09/2017 0922      Chemistry      Component Value Date/Time   NA 140 07/09/2017 0922   K 3.9 07/09/2017 0922   CL 109 07/09/2017 0922   CO2 25 07/09/2017 0922   BUN 11 07/09/2017 0922   CREATININE 0.71 07/09/2017 0922      Component Value Date/Time   CALCIUM 9.4 07/09/2017 0922   ALKPHOS 75 07/09/2017 0922   AST 39 07/09/2017 0922   ALT 38 07/09/2017 0922   BILITOT 0.6 07/09/2017 0922       RADIOGRAPHIC STUDIES:  No results found.   PATHOLOGY:    ASSESSMENT AND PLAN:  Factor V Leiden ([place]) Factor V Leiden heterozygosity with recurrent DVT, resulting in recommendation for lifelong anticoagulation.  Heterozygous factor V Leiden by itself is not a strong thrombophilia and does not typically require lifelong anticoagulation, but given her history of recurrent DVT, lifelong anticoagulation is strongly recommended.  She was previously on vitamin K antagonist therapy (Coumadin) but given the inconvenience of repeated INR checks, the patient opted to switch to NOAC and therefore Xarelto started on 09/14/2016.  Labs on 07/09/2017: CBC diff, CMET, iron/TIBC, ferritin, B12, MMA.  I personally reviewed and went over laboratory results with the patient.  The results are noted within this dictation.  At that time, laboratory work demonstrated white blood cell count of 6.0 with a normal differential, normal hemoglobin 13.5 g/dL, and platelet count 50,000.  Metabolic panel was unimpressive.  Iron studies were within normal limits, but noted ferritin to be less than 100 with normal iron studies otherwise.  Again, hemoglobin within normal limits.  12 methylmalonic acid were also completed and within normal limits.  Hyperproteinemia has resolved but remains at the upper limits normal.  We will need to monitor moving forward.  If she becomes hypoproteinemic in the future, we will need to consider screening for multiple myeloma.   Next screening mammogram is due in August  2019.  Return in 6 months for follow-up.  GIST (gastrointestinal stromal tumor), malignant ([place]) Stage II gist, status post resection by Dr. Arnoldo Morale on 01/05/2011.  She was started on adjuvant imatinib, but was complicated by cardiac dysrhythmia.  Therefore imatinib therapy was discontinued as this was considered a medication induced adverse event.  Next imaging will be due in December 2019.  Orders placed accordingly.  Order placed for CT abdomen/pelvis.  Pernicious anemia Pernicious anemia, on  lifelong B12 injections.  She self administers these injections at home on a monthly basis.     4. Chronic anticoagulation On Xarelto anticoagulation.  Tolerating well.  She denies any blood in her stools or black stools.  She denies any known blood loss.  Hemoglobin is very stable and within normal limits.  5. Chronic deep vein thrombosis (DVT) of iliac vein of left lower extremity ([name]) History of DVT, on anticoagulation.  6.  Chronic ulcer of left leg Seeing wound clinic.  Cleanly dressed.   ORDERS PLACED FOR THIS ENCOUNTER: Orders Placed This Encounter  Procedures  . CT Abdomen Pelvis W Contrast  . CBC with Differential  . Comprehensive metabolic panel  . Iron and TIBC  . Ferritin  . Vitamin B12  . Methylmalonic acid, serum    MEDICATIONS PRESCRIBED THIS ENCOUNTER: No orders of the defined types were placed in this encounter.   THERAPY PLAN:  Continue Xarelto anticoagulation indefinitely.  All questions were answered. The patient knows to call the clinic with any problems, questions or concerns. We can certainly see the patient much sooner if necessary.  Patient and plan discussed with Dr. Derek Jack and she is in agreement with the aforementioned.   This note is electronically signed by: Robynn Pane, PA-C 07/16/2017 3:53 PM

## 2017-07-16 NOTE — Patient Instructions (Signed)
[place] at [facility] Discharge Instructions  Next mammogram is due in August 2019. Next CT scan will be due in December 2019. Continue Xarelto as prescribed. Laboratory work in 6 months. Return in 6 months for follow-up.      Thank you for choosing [place] at [facility] to provide your oncology and hematology care.  To afford each patient quality time with our provider, please arrive at least 15 minutes before your scheduled appointment time.   If you have a lab appointment with the [facility] please come in thru the  Main Entrance and check in at the main information desk  You need to re-schedule your appointment should you arrive 10 or more minutes late.  We strive to give you quality time with our providers, and arriving late affects you and other patients whose appointments are after yours.  Also, if you no show three or more times for appointments you may be dismissed from the clinic at the providers discretion.     Again, thank you for choosing [facility].  Our hope is that these requests will decrease the amount of time that you wait before being seen by our physicians.       _____________________________________________________________  Should you have questions after your visit to [facility], please contact our office at [phone] between the hours of 8:30 a.m. and 4:30 p.m.  Voicemails left after 4:30 p.m. will not be returned until the following business day.  For prescription refill requests, have your pharmacy contact our office.       Resources For Cancer Patients and their Caregivers ? American Cancer Society: Can assist with transportation, wigs, general needs, runs Look Good Feel Better.        337-348-2964 ? Cancer Care: Provides financial assistance, online support groups, medication/co-pay assistance.  1-800-813-HOPE (443)820-0371) ? [place] Assists  [place] cancer patients and their families through emotional , educational and financial support.  347-379-4363 ? [place] DSS Where to apply for food stamps, Medicaid and utility assistance. 747-745-2467 ? [facility]: Transportation to medical appointments. (931)143-1618 ? Social Security Administration: May apply for disability if have a Stage IV cancer. 8590707297 3318734772 ? [employer], Disability and Transit Services: Assists with nutrition, care and transit needs. [place] Support Programs:   > Cancer Support Group  2nd Tuesday of the month 1pm-2pm, Journey Room   > Creative Journey  3rd Tuesday of the month 1130am-1pm, Journey Room

## 2017-07-16 NOTE — Assessment & Plan Note (Addendum)
Pernicious anemia, on lifelong B12 injections.  She self administers these injections at home on a monthly basis.

## 2017-07-16 NOTE — Assessment & Plan Note (Addendum)
Stage II gist, status post resection by Dr. Arnoldo Morale on 01/05/2011.  She was started on adjuvant imatinib, but was complicated by cardiac dysrhythmia.  Therefore imatinib therapy was discontinued as this was considered a medication induced adverse event.  Next imaging will be due in December 2019.  Orders placed accordingly.  Order placed for CT abdomen/pelvis.

## 2017-07-22 ENCOUNTER — Telehealth: Payer: Self-pay

## 2017-07-22 ENCOUNTER — Other Ambulatory Visit: Payer: Self-pay | Admitting: Pediatrics

## 2017-07-22 DIAGNOSIS — I471 Supraventricular tachycardia: Secondary | ICD-10-CM

## 2017-07-22 NOTE — Telephone Encounter (Signed)
[facility] - Left Msg 

## 2017-07-23 DIAGNOSIS — I87332 Chronic venous hypertension (idiopathic) with ulcer and inflammation of left lower extremity: Secondary | ICD-10-CM | POA: Diagnosis not present

## 2017-07-23 DIAGNOSIS — I87312 Chronic venous hypertension (idiopathic) with ulcer of left lower extremity: Secondary | ICD-10-CM | POA: Diagnosis not present

## 2017-07-23 DIAGNOSIS — L97222 Non-pressure chronic ulcer of left calf with fat layer exposed: Secondary | ICD-10-CM | POA: Diagnosis not present

## 2017-07-30 ENCOUNTER — Encounter (HOSPITAL_BASED_OUTPATIENT_CLINIC_OR_DEPARTMENT_OTHER): Payer: PPO | Attending: Internal Medicine

## 2017-07-30 DIAGNOSIS — Z9221 Personal history of antineoplastic chemotherapy: Secondary | ICD-10-CM | POA: Insufficient documentation

## 2017-07-30 DIAGNOSIS — I87332 Chronic venous hypertension (idiopathic) with ulcer and inflammation of left lower extremity: Secondary | ICD-10-CM | POA: Insufficient documentation

## 2017-07-30 DIAGNOSIS — I10 Essential (primary) hypertension: Secondary | ICD-10-CM | POA: Insufficient documentation

## 2017-07-30 DIAGNOSIS — L97221 Non-pressure chronic ulcer of left calf limited to breakdown of skin: Secondary | ICD-10-CM | POA: Diagnosis not present

## 2017-07-30 DIAGNOSIS — Z86718 Personal history of other venous thrombosis and embolism: Secondary | ICD-10-CM | POA: Diagnosis not present

## 2017-07-30 DIAGNOSIS — L97222 Non-pressure chronic ulcer of left calf with fat layer exposed: Secondary | ICD-10-CM | POA: Diagnosis not present

## 2017-08-06 DIAGNOSIS — I87332 Chronic venous hypertension (idiopathic) with ulcer and inflammation of left lower extremity: Secondary | ICD-10-CM | POA: Diagnosis not present

## 2017-08-06 DIAGNOSIS — L97322 Non-pressure chronic ulcer of left ankle with fat layer exposed: Secondary | ICD-10-CM | POA: Diagnosis not present

## 2017-08-06 DIAGNOSIS — I87312 Chronic venous hypertension (idiopathic) with ulcer of left lower extremity: Secondary | ICD-10-CM | POA: Diagnosis not present

## 2017-08-06 DIAGNOSIS — L97222 Non-pressure chronic ulcer of left calf with fat layer exposed: Secondary | ICD-10-CM | POA: Diagnosis not present

## 2017-08-13 DIAGNOSIS — I87332 Chronic venous hypertension (idiopathic) with ulcer and inflammation of left lower extremity: Secondary | ICD-10-CM | POA: Diagnosis not present

## 2017-08-13 DIAGNOSIS — L97221 Non-pressure chronic ulcer of left calf limited to breakdown of skin: Secondary | ICD-10-CM | POA: Diagnosis not present

## 2017-10-20 ENCOUNTER — Ambulatory Visit (INDEPENDENT_AMBULATORY_CARE_PROVIDER_SITE_OTHER): Payer: PPO | Admitting: Orthopaedic Surgery

## 2017-10-20 ENCOUNTER — Encounter (INDEPENDENT_AMBULATORY_CARE_PROVIDER_SITE_OTHER): Payer: Self-pay | Admitting: Orthopaedic Surgery

## 2017-10-20 VITALS — BP 132/76 | HR 58 | Ht 67.5 in | Wt 240.0 lb

## 2017-10-20 DIAGNOSIS — M1712 Unilateral primary osteoarthritis, left knee: Secondary | ICD-10-CM

## 2017-10-20 MED ORDER — LIDOCAINE HCL 1 % IJ SOLN
2.0000 mL | INTRAMUSCULAR | Status: AC | PRN
  Administered 2017-10-20: 2 mL

## 2017-10-20 MED ORDER — METHYLPREDNISOLONE ACETATE 40 MG/ML IJ SUSP
80.0000 mg | INTRAMUSCULAR | Status: AC | PRN
  Administered 2017-10-20: 80 mg

## 2017-10-20 MED ORDER — BUPIVACAINE HCL 0.5 % IJ SOLN
2.0000 mL | INTRAMUSCULAR | Status: AC | PRN
  Administered 2017-10-20: 2 mL via INTRA_ARTICULAR

## 2017-10-20 NOTE — Progress Notes (Signed)
Office Visit Note   Patient: [name]           Date of Birth: 1950/11/23           MRN: [id number] Visit Date: 10/20/2017              Requested by: [name], MD [place], [place] [address] PCP: [name], MD   Assessment & Plan: Visit Diagnoses:  1. Unilateral primary osteoarthritis, left knee     Plan: We will repeat cortisone injection.  Might want to consider Visco supplementation at some point in the future  Follow-Up Instructions: Return if symptoms worsen or fail to improve.   Orders:  Orders Placed This Encounter  Procedures  . Large Joint Inj: L knee   No orders of the defined types were placed in this encounter.     Procedures: Large Joint Inj: L knee on 10/20/2017 10:10 AM Indications: pain and diagnostic evaluation Details: 25 G 1.5 in needle, anteromedial approach  Arthrogram: No  Medications: 2 mL lidocaine 1 %; 2 mL bupivacaine 0.5 %; 80 mg methylPREDNISolone acetate 40 MG/ML Procedure, treatment alternatives, risks and benefits explained, specific risks discussed. Consent was given by the patient. Patient was prepped and draped in the usual sterile fashion.       Clinical Data: No additional findings.   Subjective: Chief Complaint  Patient presents with  . Follow-up    RE CURRENT L KNEE PAIN FOR SEV YRS, JUST GETTING WORSE AND WOULD LIKE INJECTION LAST INJECTION APRIL/MAY 2019  5 months status post prior cortisone injection left knee with excellent result.  [name] is planning to attend her granddaughter's wedding in November and wanted to have another injection as she was "beginning to have some more pain in her left knee".  No recent injury or trauma.  Prior films demonstrate osteoarthritis in all 3 compartments.  Is been using over-the-counter medicines with  HPI  Review of Systems  Constitutional: Negative for fatigue and fever.  HENT: Negative for ear pain.   Eyes: Negative for pain.  Respiratory:  Negative for cough and shortness of breath.   Cardiovascular: Negative for leg swelling.  Gastrointestinal: Negative for constipation and diarrhea.  Genitourinary: Negative for difficulty urinating.  Musculoskeletal: Negative for back pain and neck pain.  Skin: Negative for rash.  Allergic/Immunologic: Positive for food allergies.  Neurological: Positive for weakness. Negative for numbness.  Hematological: Bruises/bleeds easily.  Psychiatric/Behavioral: Negative for sleep disturbance.     Objective: Vital Signs: BP 132/76 (BP Location: Right Arm, Patient Position: Sitting, Cuff Size: Normal)   Pulse (!) 58   Ht 5' 7.5" (1.715 m)   Wt 240 lb (108.9 kg)   BMI 37.03 kg/m   Physical Exam  Constitutional: She is oriented to person, place, and time. She appears well-developed and well-nourished.  HENT:  Mouth/Throat: Oropharynx is clear and moist.  Eyes: Pupils are equal, round, and reactive to light. EOM are normal.  Pulmonary/Chest: Effort normal.  Neurological: She is alert and oriented to person, place, and time.  Skin: Skin is warm and dry.  Psychiatric: She has a normal mood and affect. Her behavior is normal.    Ortho Exam left knee with predominant medial joint pain.  No effusion.  Lacks just a few degrees to full extension today but is able to flex over 100 degrees.  No instability.  No calf pain.  Ulcer around left ankle has healed  Specialty Comments:  No specialty comments  available.  Imaging: No results found.   PMFS History: Patient Active Problem List   Diagnosis Date Noted  . Unilateral primary osteoarthritis, left knee 05/26/2017  . Depression 11/19/2014  . Hypotension, iatrogenic 03/26/2011  . Lip swelling 01/03/2011  . GIST (gastrointestinal stromal tumor), malignant ([place]) 01/03/2011  . Chronic ulcer of left leg ([place]) 01/01/2011  . Chronic anticoagulation 01/01/2011  . Factor V Leiden ([place])   . DVT (deep venous thrombosis) ([place]) 07/10/2010  .  Pernicious anemia 07/10/2010  . Iron deficiency anemia 08/27/2009  . WEIGHT GAIN 08/27/2009  . Reflux esophagitis 07/05/2009  . GI BLEEDING 06/11/2009   Past Medical History:  Diagnosis Date  . Allergic urticaria 01/04/2011   Rash from tape.  . Anxiety   . Cellulitis of left leg 2006  . Clotting disorder ([place])    heterozygosity from factor v leiden  . DVT (deep venous thrombosis) ([place]) 07/10/2010   on coumadin  . Dyspnea    with exertion  . Factor V Leiden ([place])   . GERD (gastroesophageal reflux disease)   . GIST (gastrointestinal stromal tumor), malignant ([place]) 01/03/2011   S/P resection on 01/05/11.  Intolerant to [employer]. Skin Cancer Left hand  . History of blood transfusion   . Hypertension   . Pernicious anemia 07/10/2010  . Small bowel mass 01/03/2011   s/p surgery  . Ulcer 05/2009   esophageal  . Ventricular tachycardia ([place]) 03/26/11  . Vitamin B12 deficiency    vit b12 1000 mcg monthly    Family History  Problem Relation Age of Onset  . Depression Mother   . Parkinson's disease Mother     Past Surgical History:  Procedure Laterality Date  . ABDOMINAL HYSTERECTOMY  1989  . BALLOON DILATION  12/11/2010   Procedure: BALLOON DILATION;  Surgeon: Rogene Houston, MD;  [facility];  Service: Endoscopy;  Laterality: N/A;  . BOWEL RESECTION  01/05/2011   Procedure: SMALL BOWEL RESECTION;  Surgeon: Jamesetta So;  Location: [facility];  Service: General;;  Partial Small Bowel Resection  . COLONOSCOPY  02/25/2012   Procedure: COLONOSCOPY;  Surgeon: Rogene [place], MD;  [facility];  Service: Endoscopy;  Laterality: N/A;  1200  . GIVENS CAPSULE STUDY  01/02/2011   Procedure: GIVENS CAPSULE STUDY;  Surgeon: Rogene [place], MD;  [facility];  Service: Endoscopy;  Laterality: N/A;  . LAPAROTOMY  01/05/2011   Procedure: EXPLORATORY LAPAROTOMY;  Surgeon: Jamesetta So;  Location: [facility];  Service: General;  Laterality: N/A;  . OPEN REDUCTION  INTERNAL FIXATION (ORIF) SCAPHOID WITH DISTAL RADIUS GRAFT Right 02/01/2016   Procedure: Right distal radius open reduction and internal fixation and repair as indicated;  Surgeon: Iran Planas, MD;  Location: [place];  Service: Orthopedics;  Laterality: Right;  Requests 90 mins   Social History   Occupational History    Employer: [employer]  . Occupation: CNA    Employer: [employer]  Tobacco Use  . Smoking status: Never Smoker  . Smokeless tobacco: Never Used  Substance and Sexual Activity  . Alcohol use: No  . Drug use: No  . Sexual activity: Not Currently

## 2017-11-22 ENCOUNTER — Other Ambulatory Visit (HOSPITAL_COMMUNITY): Payer: Self-pay | Admitting: Internal Medicine

## 2017-11-22 DIAGNOSIS — Z1231 Encounter for screening mammogram for malignant neoplasm of breast: Secondary | ICD-10-CM

## 2017-11-24 DIAGNOSIS — R21 Rash and other nonspecific skin eruption: Secondary | ICD-10-CM | POA: Diagnosis not present

## 2017-11-26 DIAGNOSIS — L0201 Cutaneous abscess of face: Secondary | ICD-10-CM | POA: Diagnosis not present

## 2017-11-26 DIAGNOSIS — L989 Disorder of the skin and subcutaneous tissue, unspecified: Secondary | ICD-10-CM | POA: Diagnosis not present

## 2017-12-06 ENCOUNTER — Encounter (HOSPITAL_COMMUNITY): Payer: Self-pay

## 2017-12-06 ENCOUNTER — Ambulatory Visit (HOSPITAL_COMMUNITY)
Admission: RE | Admit: 2017-12-06 | Discharge: 2017-12-06 | Disposition: A | Discharge status: Home / Self Care | Payer: PPO | Source: Ambulatory Visit | Attending: Internal Medicine | Admitting: Internal Medicine

## 2017-12-06 DIAGNOSIS — Z1231 Encounter for screening mammogram for malignant neoplasm of breast: Secondary | ICD-10-CM | POA: Diagnosis not present

## 2018-01-07 ENCOUNTER — Inpatient Hospital Stay (HOSPITAL_COMMUNITY): Payer: PPO | Attending: Hematology

## 2018-01-07 DIAGNOSIS — Z79899 Other long term (current) drug therapy: Secondary | ICD-10-CM | POA: Insufficient documentation

## 2018-01-07 DIAGNOSIS — K219 Gastro-esophageal reflux disease without esophagitis: Secondary | ICD-10-CM | POA: Insufficient documentation

## 2018-01-07 DIAGNOSIS — I10 Essential (primary) hypertension: Secondary | ICD-10-CM | POA: Diagnosis not present

## 2018-01-07 DIAGNOSIS — D3502 Benign neoplasm of left adrenal gland: Secondary | ICD-10-CM | POA: Insufficient documentation

## 2018-01-07 DIAGNOSIS — D510 Vitamin B12 deficiency anemia due to intrinsic factor deficiency: Secondary | ICD-10-CM | POA: Diagnosis not present

## 2018-01-07 DIAGNOSIS — I472 Ventricular tachycardia: Secondary | ICD-10-CM | POA: Insufficient documentation

## 2018-01-07 DIAGNOSIS — Z7901 Long term (current) use of anticoagulants: Secondary | ICD-10-CM | POA: Insufficient documentation

## 2018-01-07 DIAGNOSIS — D6851 Activated protein C resistance: Secondary | ICD-10-CM | POA: Diagnosis not present

## 2018-01-07 DIAGNOSIS — F419 Anxiety disorder, unspecified: Secondary | ICD-10-CM | POA: Insufficient documentation

## 2018-01-07 DIAGNOSIS — Z86718 Personal history of other venous thrombosis and embolism: Secondary | ICD-10-CM | POA: Insufficient documentation

## 2018-01-07 DIAGNOSIS — R200 Anesthesia of skin: Secondary | ICD-10-CM | POA: Insufficient documentation

## 2018-01-07 DIAGNOSIS — R0602 Shortness of breath: Secondary | ICD-10-CM | POA: Insufficient documentation

## 2018-01-07 DIAGNOSIS — R202 Paresthesia of skin: Secondary | ICD-10-CM | POA: Insufficient documentation

## 2018-01-07 DIAGNOSIS — C49A3 Gastrointestinal stromal tumor of small intestine: Secondary | ICD-10-CM | POA: Insufficient documentation

## 2018-01-07 LAB — CBC WITH DIFFERENTIAL/PLATELET
ABS IMMATURE GRANULOCYTES: 0.02 10*3/uL (ref 0.00–0.07)
BASOS ABS: 0 10*3/uL (ref 0.0–0.1)
Basophils Relative: 0 %
Eosinophils Absolute: 0.1 10*3/uL (ref 0.0–0.5)
Eosinophils Relative: 2 %
HCT: 44.6 % (ref 36.0–46.0)
HEMOGLOBIN: 13.7 g/dL (ref 12.0–15.0)
Immature Granulocytes: 0 %
LYMPHS ABS: 2.2 10*3/uL (ref 0.7–4.0)
LYMPHS PCT: 40 %
MCH: 28.2 pg (ref 26.0–34.0)
MCHC: 30.7 g/dL (ref 30.0–36.0)
MCV: 92 fL (ref 80.0–100.0)
Monocytes Absolute: 0.6 10*3/uL (ref 0.1–1.0)
Monocytes Relative: 11 %
NEUTROS ABS: 2.6 10*3/uL (ref 1.7–7.7)
NRBC: 0 % (ref 0.0–0.2)
Neutrophils Relative %: 47 %
Platelets: 297 10*3/uL (ref 150–400)
RBC: 4.85 MIL/uL (ref 3.87–5.11)
RDW: 13.5 % (ref 11.5–15.5)
WBC: 5.5 10*3/uL (ref 4.0–10.5)

## 2018-01-07 LAB — COMPREHENSIVE METABOLIC PANEL
ALBUMIN: 3.7 g/dL (ref 3.5–5.0)
ALK PHOS: 77 U/L (ref 38–126)
ALT: 26 U/L (ref 0–44)
AST: 23 U/L (ref 15–41)
Anion gap: 6 (ref 5–15)
BUN: 11 mg/dL (ref 8–23)
CALCIUM: 9.1 mg/dL (ref 8.9–10.3)
CO2: 26 mmol/L (ref 22–32)
CREATININE: 0.78 mg/dL (ref 0.44–1.00)
Chloride: 108 mmol/L (ref 98–111)
GFR calc Af Amer: >60 mL/min (ref >60)
GFR calc non Af Amer: >60 mL/min (ref >60)
GLUCOSE: 101 mg/dL — AB (ref 70–99)
Potassium: 4.4 mmol/L (ref 3.5–5.1)
SODIUM: 140 mmol/L (ref 135–145)
Total Bilirubin: 0.5 mg/dL (ref 0.3–1.2)
Total Protein: 7.4 g/dL (ref 6.5–8.1)

## 2018-01-07 LAB — IRON AND TIBC
Iron: 81 ug/dL (ref 28–170)
SATURATION RATIOS: 27 % (ref 10.4–31.8)
TIBC: 302 ug/dL (ref 250–450)
UIBC: 221 ug/dL

## 2018-01-07 LAB — VITAMIN B12: Vitamin B-12: 405 pg/mL (ref 180–914)

## 2018-01-07 LAB — FERRITIN: Ferritin: 53 ng/mL (ref 11–307)

## 2018-01-13 LAB — METHYLMALONIC ACID, SERUM: Methylmalonic Acid, Quantitative: 114 nmol/L (ref 0–378)

## 2018-01-14 ENCOUNTER — Encounter (HOSPITAL_COMMUNITY): Payer: Self-pay | Admitting: Hematology

## 2018-01-14 ENCOUNTER — Inpatient Hospital Stay (HOSPITAL_BASED_OUTPATIENT_CLINIC_OR_DEPARTMENT_OTHER): Payer: PPO | Admitting: Hematology

## 2018-01-14 ENCOUNTER — Ambulatory Visit (HOSPITAL_COMMUNITY): Payer: PPO | Admitting: Hematology

## 2018-01-14 VITALS — BP 149/72 | HR 58 | Temp 97.8°F | Resp 16 | Wt 250.0 lb

## 2018-01-14 DIAGNOSIS — D3502 Benign neoplasm of left adrenal gland: Secondary | ICD-10-CM | POA: Diagnosis not present

## 2018-01-14 DIAGNOSIS — R0602 Shortness of breath: Secondary | ICD-10-CM | POA: Diagnosis not present

## 2018-01-14 DIAGNOSIS — C49A3 Gastrointestinal stromal tumor of small intestine: Secondary | ICD-10-CM

## 2018-01-14 DIAGNOSIS — K219 Gastro-esophageal reflux disease without esophagitis: Secondary | ICD-10-CM

## 2018-01-14 DIAGNOSIS — D6851 Activated protein C resistance: Secondary | ICD-10-CM | POA: Diagnosis not present

## 2018-01-14 DIAGNOSIS — R200 Anesthesia of skin: Secondary | ICD-10-CM

## 2018-01-14 DIAGNOSIS — Z7901 Long term (current) use of anticoagulants: Secondary | ICD-10-CM

## 2018-01-14 DIAGNOSIS — F419 Anxiety disorder, unspecified: Secondary | ICD-10-CM

## 2018-01-14 DIAGNOSIS — I10 Essential (primary) hypertension: Secondary | ICD-10-CM

## 2018-01-14 DIAGNOSIS — I472 Ventricular tachycardia: Secondary | ICD-10-CM

## 2018-01-14 DIAGNOSIS — D510 Vitamin B12 deficiency anemia due to intrinsic factor deficiency: Secondary | ICD-10-CM | POA: Diagnosis not present

## 2018-01-14 DIAGNOSIS — R202 Paresthesia of skin: Secondary | ICD-10-CM | POA: Diagnosis not present

## 2018-01-14 DIAGNOSIS — Z79899 Other long term (current) drug therapy: Secondary | ICD-10-CM

## 2018-01-14 DIAGNOSIS — E538 Deficiency of other specified B group vitamins: Secondary | ICD-10-CM

## 2018-01-14 DIAGNOSIS — Z86718 Personal history of other venous thrombosis and embolism: Secondary | ICD-10-CM

## 2018-01-14 NOTE — Patient Instructions (Signed)
[facility] at [facility] Discharge Instructions  Follow up in 6 months with labs    Thank you for choosing [facility] at [facility] to provide your oncology and hematology care.  To afford each patient quality time with our provider, please arrive at least 15 minutes before your scheduled appointment time.   If you have a lab appointment with the Cancer Center please come in thru the  Main Entrance and check in at the main information desk  You need to re-schedule your appointment should you arrive 10 or more minutes late.  We strive to give you quality time with our providers, and arriving late affects you and other patients whose appointments are after yours.  Also, if you no show three or more times for appointments you may be dismissed from the clinic at the providers discretion.     Again, thank you for choosing [facility].  Our hope is that these requests will decrease the amount of time that you wait before being seen by our physicians.       _____________________________________________________________  Should you have questions after your visit to  Cancer Center, please contact our office at [phone] between the hours of 8:00 a.m. and 4:30 p.m.  Voicemails left after 4:00 p.m. will not be returned until the following business day.  For prescription refill requests, have your pharmacy contact our office and allow 72 hours.    Cancer Center Support Programs:   > Cancer Support Group  2nd Tuesday of the month 1pm-2pm, Journey Room    

## 2018-01-14 NOTE — Progress Notes (Signed)
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 Findings characteristic of gastrointestinal stromal tumor. Adenocarcinoma or malignant transformation are not excluded.  Stable left adrenal adenoma.    04/03/2011 Imaging    CT abd/pelvis- Small bowel obstruction at small bowel anastomosis. Single loop of small bowel in the left mid abdomen shows wall thickening, nonspecific; this can be seen with infection, inflammatory bowel disease and ischemia. Free intraperitoneal fluid. Small right pleural effusion and bibasilar atelectasis. Left adrenal adenoma.    01/25/2012 Imaging    CT abd/pelvis- Prior small bowel resection with anastomoses in the left mid abdomen.  No evidence of metastatic disease in the abdomen/pelvis.  No evidence of bowel obstruction.  Focal eccentric wall thickening along the lateral aspect of the cecum.  Colonoscopy is suggested to exclude a primary colonic neoplasm.     01/25/2013 Imaging    CT abd/pelvis- 1. Thickening through the gastric cardiac region likely represents redundant folds of normal mucosa. Recommend attention on follow-up. 2. Mild haziness to the central mesentery is similar to and prior likely related to prior bowel surgery. No evidence of GIST recurrence within the small bowel. 3. No evidence of adenopathy or mass in the  abdomen or pelvis. 4. Stable left adrenal adenoma. 5. 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    01/15/2014 Imaging    CT abd/pelvis- Stable exam. No evidence for recurrent disease in the abdomen or pelvis.  Stable left adrenal adenoma.  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    01/10/2015 Imaging    CT abd/pelvis- 1. Stable exam. No evidence for recurrent disease within the abdomen or pelvis. 2. Stable left adrenal gland adenoma. 3. 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    01/23/2015 Imaging    CT abd/pelvis- Small bowel obstruction with a transition zone just distal to the ileo ileal anastomosis in the anterior pelvis and secondary to peritoneal adhesions.      CANCER STAGING: Cancer Staging GIST (gastrointestinal stromal tumor), malignant ([place]) Staging form: Soft Tissue Sarcoma, AJCC 7th Edition - Clinical: Stage IIB (T2, N0, M0) - Signed by Baird Cancer, PA on 01/29/2011    INTERVAL HISTORY:  [name] 67 y.o. female returns for routine follow-up heterozygous factor V leiden mutation with RECURRENT DVT and malignant gastrointestinal stromal tumor (GIST) of small intestine. She is doing well. She does have an issue with her feet burning and tingling in the afternoon. She reports it is mostly at night and it has been going on for about 2 weeks. Denies any nausea, vomiting, or diarrhea. Denies any new pains. Had not noticed any recent bleeding such as epistaxis, hematuria or hematochezia. Denies recent chest pain on exertion, shortness of breath on minimal exertion, pre-syncopal episodes, or palpitations. Denies any recent fevers, infections, or recent hospitalizations.     REVIEW OF  SYSTEMS:  Review of Systems  All other systems reviewed and are negative.    PAST MEDICAL/SURGICAL HISTORY:  Past Medical History:  Diagnosis Date  . Allergic urticaria 01/04/2011   Rash  from tape.  . Anxiety   . Cellulitis of left leg 2006  . Clotting disorder ([place])    heterozygosity from factor v leiden  . DVT (deep venous thrombosis) ([place]) 07/10/2010   on coumadin  . Dyspnea    with exertion  . Factor V Leiden ([place])   . GERD (gastroesophageal reflux disease)   . GIST (gastrointestinal stromal tumor), malignant ([place]) 01/03/2011   S/P resection on 01/05/11.  Intolerant to [employer]. Skin Cancer Left hand  . History of blood transfusion   . Hypertension   . Pernicious anemia 07/10/2010  . Small bowel mass 01/03/2011   s/p surgery  . Ulcer 05/2009   esophageal  . Ventricular tachycardia ([place]) 03/26/11  . Vitamin B12 deficiency    vit b12 1000 mcg monthly   Past Surgical History:  Procedure Laterality Date  . ABDOMINAL HYSTERECTOMY  1989  . BALLOON DILATION  12/11/2010   Procedure: BALLOON DILATION;  Surgeon: Rogene Houston, MD;  [facility];  Service: Endoscopy;  Laterality: N/A;  . BOWEL RESECTION  01/05/2011   Procedure: SMALL BOWEL RESECTION;  Surgeon: Jamesetta So;  Location: [facility];  Service: General;;  Partial Small Bowel Resection  . COLONOSCOPY  02/25/2012   Procedure: COLONOSCOPY;  Surgeon: Rogene Houston, MD;  [facility];  Service: Endoscopy;  Laterality: N/A;  1200  . GIVENS CAPSULE STUDY  01/02/2011   Procedure: GIVENS CAPSULE STUDY;  Surgeon: Rogene Houston, MD;  [facility];  Service: Endoscopy;  Laterality: N/A;  . LAPAROTOMY  01/05/2011   Procedure: EXPLORATORY LAPAROTOMY;  Surgeon: Jamesetta So;  Location: [facility];  Service: General;  Laterality: N/A;  . OPEN REDUCTION INTERNAL FIXATION (ORIF) SCAPHOID WITH DISTAL RADIUS GRAFT Right 02/01/2016   Procedure: Right distal radius open reduction and internal fixation and repair as indicated;  Surgeon: Iran Planas, MD;  Location: [facility];  Service: Orthopedics;  Laterality: Right;  Requests 90 mins     SOCIAL HISTORY:  Social History   Socioeconomic History  .  Marital status: Married    Spouse name: Jeneen Rinks  . Number of children: 3  . Years of education: Not on file  . Highest education level: Not on file  Occupational History    Employer: [employer]  . Occupation: Forensic psychologist: [employer]  Social Needs  . Financial resource strain: Not hard at all  . Food insecurity:    Worry: Never true    Inability: Never true  . Transportation needs:    Medical: No    Non-medical: No  Tobacco Use  . Smoking status: Never Smoker  . Smokeless tobacco: Never Used  Substance and Sexual Activity  . Alcohol use: No  . Drug use: No  . Sexual activity: Not Currently  Lifestyle  . Physical activity:    Days per week: 3 days    Minutes per session: 30 min  . Stress: Not at all  Relationships  . Social connections:    Talks on phone: More than three times a week    Gets together: More than three times a week    Attends religious service: More than 4 times per year    Active member of club or organization: No    Attends  meetings of clubs or organizations: Never    Relationship status: Married  . Intimate partner violence:    Fear of current or ex partner: No    Emotionally abused: No    Physically abused: No    Forced sexual activity: No  Other Topics Concern  . Not on file  Social History Narrative  . Not on file    FAMILY HISTORY:  Family History  Problem Relation Age of Onset  . Depression Mother   . Parkinson's disease Mother     CURRENT MEDICATIONS:  Outpatient Encounter Medications as of 01/14/2018  Medication Sig  . acetaminophen (TYLENOL) 500 MG tablet Take 500-1,000 mg by mouth every 6 (six) hours as needed for moderate pain.  . cyanocobalamin (,VITAMIN B-12,) 1000 MCG/ML injection INJECT 1ML IM EVERY 30 DAYS  . diphenhydrAMINE (BENADRYL) 25 mg capsule Take 25 mg by mouth every 6 (six) hours as needed for allergies (bee stings).   . metoprolol tartrate (LOPRESSOR) 25 MG tablet TAKE 1/2 TABLET TWICE DAILY  . omeprazole  (PRILOSEC) 20 MG capsule Take 20 mg by mouth daily.  Alveda Reasons 20 MG TABS tablet Take 1 tablet (20 mg total) by mouth daily with supper.  . [DISCONTINUED] clindamycin (CLEOCIN) 300 MG capsule   . [DISCONTINUED] clobetasol cream (TEMOVATE) 5.46 % Apply 1 application topically 2 (two) times daily.  . [DISCONTINUED] HYDROcodone-acetaminophen (NORCO/VICODIN) 5-325 MG tablet    No facility-administered encounter medications on file as of 01/14/2018.     ALLERGIES:  Allergies  Allergen Reactions  . Imatinib Other (See Comments)    Cardiac dysrhythmia  . Other Swelling    PECANS MOUTH SWELLS  . Penicillins Anaphylaxis    Has patient had a PCN reaction causing immediate rash, facial/tongue/throat swelling, SOB or lightheadedness with hypotension: No no Has patient had a PCN reaction causing severe rash involving mucus membranes or skin necrosis: No Has patient had a PCN reaction that required hospitalization No Has patient had a PCN reaction occurring within the last 10 years: No If all of the above answers are "NO", then may proceed with Cephalosporin use.   . Bee Venom Swelling and Rash    SWELLING REACTION UNSPECIFIED   . Cephalexin Swelling    PATIENT WITH Rx OF ANAPHYLAXIS TO PCN's SWELLING REACTION UNSPECIFIED   . Dexlansoprazole Swelling    SWELLING REACTION UNSPECIFIED   . Latex Itching  . Nylon Rash  . Sulfonamide Derivatives Rash  . Tape Itching    Paper tape is ok     PHYSICAL EXAM:  ECOG Performance status: 1  Vitals:   01/14/18 1334  BP: (!) 149/72  Pulse: (!) 58  Resp: 16  Temp: 97.8 F (36.6 C)  SpO2: 99%   Filed Weights   01/14/18 1334  Weight: 250 lb (113.4 kg)    Physical Exam Constitutional:      Appearance: Normal appearance. She is normal weight.  Musculoskeletal: Normal range of motion.  Skin:    General: Skin is warm and dry.  Neurological:     General: No focal deficit present.     Mental Status: She is alert and oriented to person,  place, and time. Mental status is at baseline.  Psychiatric:        Mood and Affect: Mood normal.        Behavior: Behavior normal.        Thought Content: Thought content normal.        Judgment: Judgment normal.      LABORATORY  DATA:  I have reviewed the labs as listed.  CBC    Component Value Date/Time   WBC 5.5 01/07/2018 0838   RBC 4.85 01/07/2018 0838   HGB 13.7 01/07/2018 0838   HCT 44.6 01/07/2018 0838   PLT 297 01/07/2018 0838   MCV 92.0 01/07/2018 0838   MCH 28.2 01/07/2018 0838   MCHC 30.7 01/07/2018 0838   RDW 13.5 01/07/2018 0838   LYMPHSABS 2.2 01/07/2018 0838   MONOABS 0.6 01/07/2018 0838   EOSABS 0.1 01/07/2018 0838   BASOSABS 0.0 01/07/2018 0838   CMP Latest Ref Rng & Units 01/07/2018 07/09/2017 01/31/2017  Glucose 70 - 99 mg/dL 101(H) 90 122(H)  BUN 8 - 23 mg/dL 11 11 18   Creatinine 0.44 - 1.00 mg/dL 0.78 0.71 0.73  Sodium 135 - 145 mmol/L 140 140 141  Potassium 3.5 - 5.1 mmol/L 4.4 3.9 3.9  Chloride 98 - 111 mmol/L 108 109 108  CO2 22 - 32 mmol/L 26 25 23   Calcium 8.9 - 10.3 mg/dL 9.1 9.4 9.1  Total Protein 6.5 - 8.1 g/dL 7.4 7.5 7.2  Total Bilirubin 0.3 - 1.2 mg/dL 0.5 0.6 0.4  Alkaline Phos 38 - 126 U/L 77 75 81  AST 15 - 41 U/L 23 39 26  ALT 0 - 44 U/L 26 38 31       DIAGNOSTIC IMAGING:  I have independently reviewed the scans and discussed with the patient.   I have reviewed Francene Finders, NP's note and agree with the documentation.  I personally performed a face-to-face visit, made revisions and my assessment and plan is as follows.    ASSESSMENT & PLAN:   GIST (gastrointestinal stromal tumor), malignant ([place]) 1.  T3N0 GIST of small bowel: - Status post resection on 01/05/2011, pathology showing 5.8 cm, 0/8 lymph nodes involved, margins negative, 1 mitosis/50 HPF, intermediate grade.  Presentation with GI bleed. -Adjuvant imatinib complicated by cardiac arrhythmias -She denies any bleeding per rectum or melena. - I reviewed the  results of the CT CAP dated 01/31/2017 which did not show any evidence of metastatic disease. -We will see her back in 6 months for follow-up.  2.  Recurrent DVT: - She was on warfarin for 32 years and was switched to Xarelto in August 2018. -She is tolerating it very well. - She also has factor V Leiden heterozygosity.  3.  Pernicious anemia: -She is continuing vitamin B12 every month. -Her vitamin B12 was in the 400 range.      Orders placed this encounter:  Orders Placed This Encounter  Procedures  . CBC with Differential/Platelet  . Comprehensive metabolic panel  . Ferritin  . Iron and TIBC  . Vitamin B12  . Folate      Derek Jack, MD [place] [phone]

## 2018-01-14 NOTE — Assessment & Plan Note (Signed)
1.  T3N0 GIST of small bowel: - Status post resection on 01/05/2011, pathology showing 5.8 cm, 0/8 lymph nodes involved, margins negative, 1 mitosis/50 HPF, intermediate grade.  Presentation with GI bleed. -Adjuvant imatinib complicated by cardiac arrhythmias -She denies any bleeding per rectum or melena. - I reviewed the results of the CT CAP dated 01/31/2017 which did not show any evidence of metastatic disease. -We will see her back in 6 months for follow-up.  2.  Recurrent DVT: - She was on warfarin for 32 years and was switched to Xarelto in August 2018. -She is tolerating it very well. - She also has factor V Leiden heterozygosity.  3.  Pernicious anemia: -She is continuing vitamin B12 every month. -Her vitamin B12 was in the 400 range.

## 2018-02-03 ENCOUNTER — Other Ambulatory Visit: Payer: Self-pay | Admitting: Pediatrics

## 2018-02-03 DIAGNOSIS — I471 Supraventricular tachycardia: Secondary | ICD-10-CM

## 2018-03-03 ENCOUNTER — Ambulatory Visit (INDEPENDENT_AMBULATORY_CARE_PROVIDER_SITE_OTHER): Payer: PPO | Admitting: Internal Medicine

## 2018-03-14 DIAGNOSIS — H6121 Impacted cerumen, right ear: Secondary | ICD-10-CM | POA: Diagnosis not present

## 2018-04-18 ENCOUNTER — Other Ambulatory Visit: Payer: Self-pay | Admitting: Pediatrics

## 2018-04-18 DIAGNOSIS — I471 Supraventricular tachycardia: Secondary | ICD-10-CM

## 2018-04-29 ENCOUNTER — Telehealth: Payer: Self-pay | Admitting: Family Medicine

## 2018-04-29 ENCOUNTER — Other Ambulatory Visit (HOSPITAL_COMMUNITY): Payer: Self-pay | Admitting: *Deleted

## 2018-04-29 DIAGNOSIS — D510 Vitamin B12 deficiency anemia due to intrinsic factor deficiency: Secondary | ICD-10-CM

## 2018-04-29 MED ORDER — CYANOCOBALAMIN 1000 MCG/ML IJ SOLN: INTRAMUSCULAR | 11 refills | Status: DC

## 2018-04-29 NOTE — Telephone Encounter (Signed)
Pt hasn't been seen in our office since 03/2017 but pt has had labs through her Oncologist in 12/19 so wasn't sure if she ntbs in office or if it could be Televisit? Please advise.

## 2018-04-29 NOTE — Telephone Encounter (Signed)
She can do a televisit for refills. But she will need labs no later than 06/2018

## 2018-04-29 NOTE — Telephone Encounter (Signed)
LM making pt aware that apt on 4/6 well be done as televisit and that pt will need to have labs done no later than 06/2018.

## 2018-05-02 ENCOUNTER — Other Ambulatory Visit: Payer: Self-pay

## 2018-05-02 ENCOUNTER — Encounter: Payer: Self-pay | Admitting: Family Medicine

## 2018-05-02 ENCOUNTER — Ambulatory Visit (INDEPENDENT_AMBULATORY_CARE_PROVIDER_SITE_OTHER): Payer: PPO | Admitting: Family Medicine

## 2018-05-02 DIAGNOSIS — I471 Supraventricular tachycardia: Secondary | ICD-10-CM | POA: Diagnosis not present

## 2018-05-02 MED ORDER — METOPROLOL TARTRATE 25 MG PO TABS: 12.5000 mg | ORAL_TABLET | Freq: Two times a day (BID) | ORAL | 1 refills | Status: DC

## 2018-05-02 NOTE — Progress Notes (Signed)
Virtual Visit via telephone Note Due to COVID-19, visit is conducted virtually and was requested by patient.  I connected with [name] on 05/02/18 at 1005 by telephone and verified that I am speaking with the correct person using two identifiers. [name] is currently located at home and family is currently with them during visit. The provider, Monia Pouch, FNP is located in their office at time of visit.  I discussed the limitations, risks, security and privacy concerns of performing an evaluation and management service by telephone and the availability of in person appointments. I also discussed with the patient that there may be a patient responsible charge related to this service. The patient expressed understanding and agreed to proceed.  Subjective:  Patient ID: [name], female    DOB: [birth year], 68 y.o.   MRN: [id number]  Chief Complaint:  No chief complaint on file.   HPI: [name] is a 68 y.o. female presenting on 05/02/2018 for No chief complaint on file.   Pt reports she needs a refill on her metoprolol. States she has a history of SVT due to chemotherapy. States she was put on this to control her HR. She denies palpitations, chest pain, shortness of breath, dizziness, weakness, headaches, or syncope. States her heart rate is well controlled with 12.5 mg twice daily. No other complaints or concerns.    Relevant past medical, surgical, family, and social history reviewed and updated as indicated.  Allergies and medications reviewed and updated.   Past Medical History:  Diagnosis Date  . Allergic urticaria 01/04/2011   Rash from tape.  . Anxiety   . Cellulitis of left leg 2006  . Clotting disorder ([place])    heterozygosity from factor v leiden  . DVT (deep venous thrombosis) ([place]) 07/10/2010   on coumadin  . Dyspnea    with exertion  . Factor V Leiden ([place])   . GERD (gastroesophageal reflux disease)   . GIST (gastrointestinal stromal  tumor), malignant ([place]) 01/03/2011   S/P resection on 01/05/11.  Intolerant to [employer]. Skin Cancer Left hand  . History of blood transfusion   . Hypertension   . Pernicious anemia 07/10/2010  . Small bowel mass 01/03/2011   s/p surgery  . Ulcer 05/2009   esophageal  . Ventricular tachycardia ([place]) 03/26/11  . Vitamin B12 deficiency    vit b12 1000 mcg monthly    Past Surgical History:  Procedure Laterality Date  . ABDOMINAL HYSTERECTOMY  1989  . BALLOON DILATION  12/11/2010   Procedure: BALLOON DILATION;  Surgeon: Rogene Houston, MD;  [facility];  Service: Endoscopy;  Laterality: N/A;  . BOWEL RESECTION  01/05/2011   Procedure: SMALL BOWEL RESECTION;  Surgeon: Jamesetta So;  [facility];  Service: General;;  Partial Small Bowel Resection  . COLONOSCOPY  02/25/2012   Procedure: COLONOSCOPY;  Surgeon: Rogene Houston, MD;  [facility];  Service: Endoscopy;  Laterality: N/A;  1200  . GIVENS CAPSULE STUDY  01/02/2011   Procedure: GIVENS CAPSULE STUDY;  Surgeon: Rogene Houston, MD;  [facility];  Service: Endoscopy;  Laterality: N/A;  . LAPAROTOMY  01/05/2011   Procedure: EXPLORATORY LAPAROTOMY;  Surgeon: Jamesetta So;  Location[facility];  Service: General;  Laterality: N/A;  . OPEN REDUCTION INTERNAL FIXATION (ORIF) SCAPHOID WITH DISTAL RADIUS GRAFT Right 02/01/2016   Procedure: Right distal radius open reduction and internal fixation and repair as indicated;  Surgeon: Iran Planas,  MD;  Location: [facility];  Service: Orthopedics;  Laterality: Right;  Requests 90 mins    Social History   Socioeconomic History  . Marital status: Married    Spouse name: Jeneen Rinks  . Number of children: 3  . Years of education: Not on file  . Highest education level: Not on file  Occupational History    Employer: [employer]  . Occupation: Forensic psychologist: [employer]  Social Needs  . Financial resource strain: Not hard at all  . Food insecurity:    Worry: Never  true    Inability: Never true  . Transportation needs:    Medical: No    Non-medical: No  Tobacco Use  . Smoking status: Never Smoker  . Smokeless tobacco: Never Used  Substance and Sexual Activity  . Alcohol use: No  . Drug use: No  . Sexual activity: Not Currently  Lifestyle  . Physical activity:    Days per week: 3 days    Minutes per session: 30 min  . Stress: Not at all  Relationships  . Social connections:    Talks on phone: More than three times a week    Gets together: More than three times a week    Attends religious service: More than 4 times per year    Active member of club or organization: No    Attends meetings of clubs or organizations: Never    Relationship status: Married  . Intimate partner violence:    Fear of current or ex partner: No    Emotionally abused: No    Physically abused: No    Forced sexual activity: No  Other Topics Concern  . Not on file  Social History Narrative  . Not on file    Outpatient Encounter Medications as of 05/02/2018  Medication Sig  . acetaminophen (TYLENOL) 500 MG tablet Take 500-1,000 mg by mouth every 6 (six) hours as needed for moderate pain.  . cyanocobalamin (,VITAMIN B-12,) 1000 MCG/ML injection INJECT 1ML IM EVERY 30 DAYS  . diphenhydrAMINE (BENADRYL) 25 mg capsule Take 25 mg by mouth every 6 (six) hours as needed for allergies (bee stings).   . metoprolol tartrate (LOPRESSOR) 25 MG tablet Take 0.5 tablets (12.5 mg total) by mouth 2 (two) times daily. (Needs to be seen before next refill)  . omeprazole (PRILOSEC) 20 MG capsule Take 20 mg by mouth daily.  Alveda Reasons 20 MG TABS tablet Take 1 tablet (20 mg total) by mouth daily with supper.  . [DISCONTINUED] metoprolol tartrate (LOPRESSOR) 25 MG tablet Take 0.5 tablets (12.5 mg total) by mouth 2 (two) times daily. (Needs to be seen before next refill)   No facility-administered encounter medications on file as of 05/02/2018.     Allergies  Allergen Reactions  . Imatinib  Other (See Comments)    Cardiac dysrhythmia  . Other Swelling    PECANS MOUTH SWELLS  . Penicillins Anaphylaxis    Has patient had a PCN reaction causing immediate rash, facial/tongue/throat swelling, SOB or lightheadedness with hypotension: No no Has patient had a PCN reaction causing severe rash involving mucus membranes or skin necrosis: No Has patient had a PCN reaction that required hospitalization No Has patient had a PCN reaction occurring within the last 10 years: No If all of the above answers are "NO", then may proceed with Cephalosporin use.   . Bee Venom Swelling and Rash    SWELLING REACTION UNSPECIFIED   . Cephalexin Swelling    PATIENT  WITH Rx OF ANAPHYLAXIS TO PCN's SWELLING REACTION UNSPECIFIED   . Dexlansoprazole Swelling    SWELLING REACTION UNSPECIFIED   . Latex Itching  . Nylon Rash  . Sulfonamide Derivatives Rash  . Tape Itching    Paper tape is ok    Review of Systems  Constitutional: Negative for activity change, appetite change, chills, diaphoresis, fatigue, fever and unexpected weight change.  Respiratory: Negative for cough, shortness of breath and wheezing.   Cardiovascular: Negative for chest pain, palpitations and leg swelling.  Gastrointestinal: Negative for abdominal pain.  Skin: Negative for color change and pallor.  Neurological: Negative for dizziness, syncope, weakness, light-headedness, numbness and headaches.  Hematological: Does not bruise/bleed easily.  Psychiatric/Behavioral: Negative for confusion.  All other systems reviewed and are negative.        Observations/Objective: No vital signs or physical exam, this was a telephone or virtual health encounter.  Pt alert and oriented, answers all questions appropriately, and able to speak in full sentences.    Assessment and Plan: Diagnoses and all orders for this visit:  SVT (supraventricular tachycardia) ([place]) Stable, continue below.  -     metoprolol tartrate (LOPRESSOR) 25 MG  tablet; Take 0.5 tablets (12.5 mg total) by mouth 2 (two) times daily. (Needs to be seen before next refill)     Follow Up Instructions: Return in about 3 months (around 08/01/2018), or if symptoms worsen or fail to improve.    I discussed the assessment and treatment plan with the patient. The patient was provided an opportunity to ask questions and all were answered. The patient agreed with the plan and demonstrated an understanding of the instructions.   The patient was advised to call back or seek an in-person evaluation if the symptoms worsen or if the condition fails to improve as anticipated.  The above assessment and management plan was discussed with the patient. The patient verbalized understanding of and has agreed to the management plan. Patient is aware to call the clinic if symptoms persist or worsen. Patient is aware when to return to the clinic for a follow-up visit. Patient educated on when it is appropriate to go to the emergency department.    I provided 15 minutes of non-face-to-face time during this encounter. The call started at 1005. The call ended at 1020.   Monia Pouch, FNP-C [facility] [address] [place], [place] [address] [phone]

## 2018-05-31 ENCOUNTER — Other Ambulatory Visit (HOSPITAL_COMMUNITY): Payer: Self-pay | Admitting: *Deleted

## 2018-05-31 DIAGNOSIS — I82402 Acute embolism and thrombosis of unspecified deep veins of left lower extremity: Secondary | ICD-10-CM

## 2018-05-31 DIAGNOSIS — Z7901 Long term (current) use of anticoagulants: Secondary | ICD-10-CM

## 2018-05-31 MED ORDER — XARELTO 20 MG PO TABS: 20.0000 mg | ORAL_TABLET | Freq: Every day | ORAL | 11 refills | Status: DC

## 2018-06-14 ENCOUNTER — Telehealth (HOSPITAL_COMMUNITY): Payer: Self-pay | Admitting: *Deleted

## 2018-06-14 NOTE — Telephone Encounter (Signed)
[facility] dentist office faxed over a treatment plan stating the pt needed two extracts done and needed authorization from the provider at the clinic. Provider reviewed patient's chart and medication list. Provider instructed that the pt needed to stop her Xarelto 72 hours before procedure, and resume Xarelto 24 hours after procedure. Returned a call back to dentist office and spoke to [place] and verbalized instructions from the provider. Office representative Horris Latino verbalized understanding.

## 2018-07-11 ENCOUNTER — Other Ambulatory Visit (HOSPITAL_COMMUNITY): Payer: PPO

## 2018-07-13 ENCOUNTER — Other Ambulatory Visit: Payer: Self-pay

## 2018-07-13 ENCOUNTER — Inpatient Hospital Stay (HOSPITAL_COMMUNITY): Payer: PPO | Attending: Nurse Practitioner

## 2018-07-13 DIAGNOSIS — Z79899 Other long term (current) drug therapy: Secondary | ICD-10-CM | POA: Insufficient documentation

## 2018-07-13 DIAGNOSIS — E538 Deficiency of other specified B group vitamins: Secondary | ICD-10-CM | POA: Diagnosis not present

## 2018-07-13 DIAGNOSIS — R5383 Other fatigue: Secondary | ICD-10-CM | POA: Insufficient documentation

## 2018-07-13 DIAGNOSIS — R000 Tachycardia, unspecified: Secondary | ICD-10-CM | POA: Insufficient documentation

## 2018-07-13 DIAGNOSIS — D510 Vitamin B12 deficiency anemia due to intrinsic factor deficiency: Secondary | ICD-10-CM | POA: Diagnosis not present

## 2018-07-13 DIAGNOSIS — F419 Anxiety disorder, unspecified: Secondary | ICD-10-CM | POA: Insufficient documentation

## 2018-07-13 DIAGNOSIS — K219 Gastro-esophageal reflux disease without esophagitis: Secondary | ICD-10-CM | POA: Diagnosis not present

## 2018-07-13 DIAGNOSIS — I10 Essential (primary) hypertension: Secondary | ICD-10-CM | POA: Insufficient documentation

## 2018-07-13 DIAGNOSIS — C49A3 Gastrointestinal stromal tumor of small intestine: Secondary | ICD-10-CM | POA: Insufficient documentation

## 2018-07-13 DIAGNOSIS — D6851 Activated protein C resistance: Secondary | ICD-10-CM | POA: Diagnosis not present

## 2018-07-13 DIAGNOSIS — Z7901 Long term (current) use of anticoagulants: Secondary | ICD-10-CM | POA: Diagnosis not present

## 2018-07-13 DIAGNOSIS — Z86718 Personal history of other venous thrombosis and embolism: Secondary | ICD-10-CM | POA: Insufficient documentation

## 2018-07-13 LAB — CBC WITH DIFFERENTIAL/PLATELET
Abs Immature Granulocytes: 0.05 10*3/uL (ref 0.00–0.07)
Basophils Absolute: 0 10*3/uL (ref 0.0–0.1)
Basophils Relative: 0 %
Eosinophils Absolute: 0.1 10*3/uL (ref 0.0–0.5)
Eosinophils Relative: 2 %
HCT: 45.9 % (ref 36.0–46.0)
Hemoglobin: 14.4 g/dL (ref 12.0–15.0)
Immature Granulocytes: 1 %
Lymphocytes Relative: 38 %
Lymphs Abs: 1.9 10*3/uL (ref 0.7–4.0)
MCH: 28.3 pg (ref 26.0–34.0)
MCHC: 31.4 g/dL (ref 30.0–36.0)
MCV: 90.4 fL (ref 80.0–100.0)
Monocytes Absolute: 0.7 10*3/uL (ref 0.1–1.0)
Monocytes Relative: 13 %
Neutro Abs: 2.4 10*3/uL (ref 1.7–7.7)
Neutrophils Relative %: 46 %
Platelets: 269 10*3/uL (ref 150–400)
RBC: 5.08 MIL/uL (ref 3.87–5.11)
RDW: 13.4 % (ref 11.5–15.5)
WBC: 5.1 10*3/uL (ref 4.0–10.5)
nRBC: 0 % (ref 0.0–0.2)

## 2018-07-13 LAB — COMPREHENSIVE METABOLIC PANEL
ALT: 28 U/L (ref 0–44)
AST: 24 U/L (ref 15–41)
Albumin: 4 g/dL (ref 3.5–5.0)
Alkaline Phosphatase: 80 U/L (ref 38–126)
Anion gap: 8 (ref 5–15)
BUN: 10 mg/dL (ref 8–23)
CO2: 26 mmol/L (ref 22–32)
Calcium: 9.4 mg/dL (ref 8.9–10.3)
Chloride: 109 mmol/L (ref 98–111)
Creatinine, Ser: 0.67 mg/dL (ref 0.44–1.00)
GFR calc Af Amer: >60 mL/min (ref >60)
GFR calc non Af Amer: >60 mL/min (ref >60)
Glucose, Bld: 112 mg/dL — ABNORMAL HIGH (ref 70–99)
Potassium: 3.7 mmol/L (ref 3.5–5.1)
Sodium: 143 mmol/L (ref 135–145)
Total Bilirubin: 0.7 mg/dL (ref 0.3–1.2)
Total Protein: 7.4 g/dL (ref 6.5–8.1)

## 2018-07-13 LAB — IRON AND TIBC
Iron: 49 ug/dL (ref 28–170)
Saturation Ratios: 16 % (ref 10.4–31.8)
TIBC: 310 ug/dL (ref 250–450)
UIBC: 261 ug/dL

## 2018-07-13 LAB — FERRITIN: Ferritin: 51 ng/mL (ref 11–307)

## 2018-07-13 LAB — FOLATE: Folate: 16.6 ng/mL (ref >5.9)

## 2018-07-13 LAB — VITAMIN B12: Vitamin B-12: 481 pg/mL (ref 180–914)

## 2018-07-15 ENCOUNTER — Other Ambulatory Visit: Payer: Self-pay

## 2018-07-18 ENCOUNTER — Encounter (HOSPITAL_COMMUNITY): Payer: Self-pay | Admitting: Hematology

## 2018-07-18 ENCOUNTER — Other Ambulatory Visit: Payer: Self-pay

## 2018-07-18 ENCOUNTER — Inpatient Hospital Stay (HOSPITAL_BASED_OUTPATIENT_CLINIC_OR_DEPARTMENT_OTHER): Payer: PPO | Admitting: Hematology

## 2018-07-18 DIAGNOSIS — Z79899 Other long term (current) drug therapy: Secondary | ICD-10-CM

## 2018-07-18 DIAGNOSIS — I10 Essential (primary) hypertension: Secondary | ICD-10-CM | POA: Diagnosis not present

## 2018-07-18 DIAGNOSIS — C49A3 Gastrointestinal stromal tumor of small intestine: Secondary | ICD-10-CM | POA: Diagnosis not present

## 2018-07-18 DIAGNOSIS — R5383 Other fatigue: Secondary | ICD-10-CM

## 2018-07-18 DIAGNOSIS — Z86718 Personal history of other venous thrombosis and embolism: Secondary | ICD-10-CM | POA: Diagnosis not present

## 2018-07-18 DIAGNOSIS — Z7901 Long term (current) use of anticoagulants: Secondary | ICD-10-CM

## 2018-07-18 DIAGNOSIS — R000 Tachycardia, unspecified: Secondary | ICD-10-CM | POA: Diagnosis not present

## 2018-07-18 DIAGNOSIS — D510 Vitamin B12 deficiency anemia due to intrinsic factor deficiency: Secondary | ICD-10-CM | POA: Diagnosis not present

## 2018-07-18 DIAGNOSIS — K219 Gastro-esophageal reflux disease without esophagitis: Secondary | ICD-10-CM | POA: Diagnosis not present

## 2018-07-18 DIAGNOSIS — F419 Anxiety disorder, unspecified: Secondary | ICD-10-CM | POA: Diagnosis not present

## 2018-07-18 DIAGNOSIS — E538 Deficiency of other specified B group vitamins: Secondary | ICD-10-CM

## 2018-07-18 DIAGNOSIS — D6851 Activated protein C resistance: Secondary | ICD-10-CM

## 2018-07-18 NOTE — Patient Instructions (Addendum)
[place] at [facility]  Discharge Instructions: Follow up in 6 months with labs   You saw Dr Delton Coombes today. _______________________________________________________________  Thank you for choosing [place] at [facility] to provide your oncology and hematology care.  To afford each patient quality time with our providers, please arrive at least 15 minutes before your scheduled appointment.  You need to re-schedule your appointment if you arrive 10 or more minutes late.  We strive to give you quality time with our providers, and arriving late affects you and other patients whose appointments are after yours.  Also, if you no show three or more times for appointments you may be dismissed from the clinic.  Again, thank you for choosing [place] at [facility] hope is that these requests will allow you access to exceptional care and in a timely manner. _______________________________________________________________  If you have questions after your visit, please contact our office at [phone] between the hours of 8:30 a.m. and 5:00 p.m. Voicemails left after 4:30 p.m. will not be returned until the following business day. _______________________________________________________________  For prescription refill requests, have your pharmacy contact our office. _______________________________________________________________  Recommendations made by the consultant and any test results will be sent to your referring physician. _______________________________________________________________

## 2018-07-18 NOTE — Assessment & Plan Note (Addendum)
1.  T3N0 Gist of small bowel: -Status post resection on 01/05/2011, pathology showing 5.8 cm, 0/8 lymph nodes involved, margins negative, 1 mitosis/50 HPF, intermediate grade.  Presentation was with GI bleed. -Adrenal Med-Neb complicated by cardiac arrhythmias. -He does not report any abdominal pains, bleeding per rectum or melena. -Last CT CAP dated 01/31/2017 did not show any evidence of metastatic disease. - Physical exam today did not reveal any palpable masses.  She will come back in 6 months for follow-up.  2.  Recurrent DVT: -She was on warfarin for 32 years and was switched to Xarelto in August 2018. -She is tolerating very well. - She also has factor V Leiden heterozygosity..  3.  Pernicious anemia: -She is continuing vitamin B12 every 4 weeks.  B12 level today is 481.  Hemoglobin is normal at 14.4. - She is complaining of a drop in energy levels 2 weeks after each B12 injection.  I have told her to increase his B12 to every 3 weeks.  4.  Health maintenance: -Mammogram on 12/06/2017 was BI-RADS Category 1.

## 2018-07-18 NOTE — Progress Notes (Signed)
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 Findings characteristic of gastrointestinal stromal tumor. Adenocarcinoma or malignant transformation are not excluded.  Stable left adrenal adenoma.   04/03/2011 Imaging   CT abd/pelvis- Small bowel obstruction at small bowel anastomosis. Single loop of small bowel in the left mid abdomen shows wall thickening, nonspecific; this can be seen with infection, inflammatory bowel disease and ischemia. Free intraperitoneal fluid. Small right pleural effusion and bibasilar atelectasis. Left adrenal adenoma.   01/25/2012 Imaging   CT abd/pelvis- Prior small bowel resection with anastomoses in the left mid abdomen.  No evidence of metastatic disease in the abdomen/pelvis.  No evidence of bowel obstruction.  Focal eccentric wall thickening along the lateral aspect of the cecum.  Colonoscopy is suggested to exclude a primary colonic neoplasm.    01/25/2013 Imaging   CT abd/pelvis- 1. Thickening through the gastric cardiac region likely represents redundant folds of normal mucosa. Recommend attention on follow-up. 2. Mild haziness to the central mesentery is similar to and prior likely related to prior bowel surgery. No evidence of GIST recurrence within the small bowel. 3. No evidence of  adenopathy or mass in the abdomen or pelvis. 4. Stable left adrenal adenoma. 5. 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   01/15/2014 Imaging   CT abd/pelvis- Stable exam. No evidence for recurrent disease in the abdomen or pelvis.  Stable left adrenal adenoma.  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   01/10/2015 Imaging   CT abd/pelvis- 1. Stable exam. No evidence for recurrent disease within the abdomen or pelvis. 2. Stable left adrenal gland adenoma. 3. 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   01/23/2015 Imaging   CT abd/pelvis- Small bowel obstruction with a transition zone just distal to the ileo ileal anastomosis in the anterior pelvis and secondary to peritoneal adhesions.      CANCER STAGING: Cancer Staging GIST (gastrointestinal stromal tumor), malignant ([place]) Staging form: Soft Tissue Sarcoma, AJCC 7th Edition - Clinical: Stage IIB (T2, N0, M0) - Signed by Baird Cancer, PA on 01/29/2011    INTERVAL HISTORY:  Ms. Hooley 68 y.o. female returns for routine follow-up heterozygous factor V leiden mutation and malignant gastrointestinal stromal tumor (GIST) of small intestine. She reports she is still having fatigue throughout the day. She only gets minimal energy from the B12 injections. She also remains SOB with activity. Denies any nausea, vomiting, or diarrhea. Denies any new pains. Had not noticed any recent bleeding such as epistaxis, hematuria or hematochezia. Denies recent chest pain on exertion, pre-syncopal episodes, or palpitations. Denies any numbness or tingling in hands or feet. Denies any recent fevers, infections, or recent hospitalizations. Patient reports appetite at 100% and energy level at 0%. She is eating well and maintaining her weight at this time.      REVIEW  OF SYSTEMS:  Review of Systems  Constitutional: Positive for fatigue.  All other systems  reviewed and are negative.    PAST MEDICAL/SURGICAL HISTORY:  Past Medical History:  Diagnosis Date  . Allergic urticaria 01/04/2011   Rash from tape.  . Anxiety   . Cellulitis of left leg 2006  . Clotting disorder ([place])    heterozygosity from factor v leiden  . DVT (deep venous thrombosis) ([place]) 07/10/2010   on coumadin  . Dyspnea    with exertion  . Factor V Leiden ([place])   . GERD (gastroesophageal reflux disease)   . GIST (gastrointestinal stromal tumor), malignant ([place]) 01/03/2011   S/P resection on 01/05/11.  Intolerant to [employer]. Skin Cancer Left hand  . History of blood transfusion   . Hypertension   . Pernicious anemia 07/10/2010  . Small bowel mass 01/03/2011   s/p surgery  . Ulcer 05/2009   esophageal  . Ventricular tachycardia ([place]) 03/26/11  . Vitamin B12 deficiency    vit b12 1000 mcg monthly   Past Surgical History:  Procedure Laterality Date  . ABDOMINAL HYSTERECTOMY  1989  . BALLOON DILATION  12/11/2010   Procedure: BALLOON DILATION;  Surgeon: Rogene Houston, MD;  [facility];  Service: Endoscopy;  Laterality: N/A;  . BOWEL RESECTION  01/05/2011   Procedure: SMALL BOWEL RESECTION;  Surgeon: Jamesetta So;  Location: [facility] OR[facility];  Service: General;;  Partial Small Bowel Resection  . COLONOSCOPY  02/25/2012   Procedure: COLONOSCOPY;  Surgeon: Rogene Houston, MD;  [facility];  Service: Endoscopy;  Laterality: N/A;  1200  . GIVENS CAPSULE STUDY  01/02/2011   Procedure: GIVENS CAPSULE STUDY;  Surgeon: Rogene Houston, MD;  [facility];  Service: Endoscopy;  Laterality: N/A;  . LAPAROTOMY  01/05/2011   Procedure: EXPLORATORY LAPAROTOMY;  Surgeon: Jamesetta So;  Location: [facility];  Service: General;  Laterality: N/A;  . OPEN REDUCTION INTERNAL FIXATION (ORIF) SCAPHOID WITH DISTAL RADIUS GRAFT Right 02/01/2016   Procedure: Right distal radius open reduction and internal fixation and repair as indicated;  Surgeon: Iran Planas, MD;   Location: [facility];  Service: Orthopedics;  Laterality: Right;  Requests 90 mins     SOCIAL HISTORY:  Social History   Socioeconomic History  . Marital status: Married    Spouse name: Jeneen Rinks  . Number of children: 3  . Years of education: Not on file  . Highest education level: Not on file  Occupational History    Employer: [employer]  . Occupation: Forensic psychologist: [employer]  Social Needs  . Financial resource strain: Not hard at all  . Food insecurity    Worry: Never true    Inability: Never true  . Transportation needs    Medical: No    Non-medical: No  Tobacco Use  . Smoking status: Never Smoker  . Smokeless tobacco: Never Used  Substance and Sexual Activity  . Alcohol use: No  . Drug use: No  . Sexual activity: Not Currently  Lifestyle  . Physical activity    Days per week: 3 days    Minutes per session: 30 min  . Stress: Not at all  Relationships  . Social connections    Talks on phone: More than three times a week    Gets together: More than three times a week    Attends religious service: More than 4 times per year    Active member of club or  organization: No    Attends meetings of clubs or organizations: Never    Relationship status: Married  . Intimate partner violence    Fear of current or ex partner: No    Emotionally abused: No    Physically abused: No    Forced sexual activity: No  Other Topics Concern  . Not on file  Social History Narrative  . Not on file    FAMILY HISTORY:  Family History  Problem Relation Age of Onset  . Depression Mother   . Parkinson's disease Mother     CURRENT MEDICATIONS:  Outpatient Encounter Medications as of 07/18/2018  Medication Sig  . [DISCONTINUED] metoprolol tartrate (LOPRESSOR) 25 MG tablet Take by mouth.  Marland Kitchen acetaminophen (TYLENOL) 500 MG tablet Take 500-1,000 mg by mouth every 6 (six) hours as needed for moderate pain.  . cyanocobalamin (,VITAMIN B-12,) 1000 MCG/ML injection INJECT 1ML IM EVERY 30  DAYS  . diphenhydrAMINE (BENADRYL) 25 mg capsule Take 25 mg by mouth every 6 (six) hours as needed for allergies (bee stings).   . metoprolol tartrate (LOPRESSOR) 25 MG tablet Take 0.5 tablets (12.5 mg total) by mouth 2 (two) times daily. (Needs to be seen before next refill)  . omeprazole (PRILOSEC) 20 MG capsule Take 20 mg by mouth daily.  Alveda Reasons 20 MG TABS tablet Take 1 tablet (20 mg total) by mouth daily with supper.   No facility-administered encounter medications on file as of 07/18/2018.     ALLERGIES:  Allergies  Allergen Reactions  . Bee Venom Swelling and Rash    SWELLING REACTION UNSPECIFIED  SWELLING REACTION UNSPECIFIED   . Cephalexin Swelling    PATIENT WITH Rx OF ANAPHYLAXIS TO PCN's SWELLING REACTION UNSPECIFIED  PATIENT WITH Rx OF ANAPHYLAXIS TO PCN's SWELLING REACTION UNSPECIFIED   . Dexlansoprazole Swelling    SWELLING REACTION UNSPECIFIED  SWELLING REACTION UNSPECIFIED   . Imatinib Other (See Comments)    Cardiac dysrhythmia  . Other Swelling    PECANS MOUTH SWELLS  . Penicillins Anaphylaxis    Has patient had a PCN reaction causing immediate rash, facial/tongue/throat swelling, SOB or lightheadedness with hypotension: No no Has patient had a PCN reaction causing severe rash involving mucus membranes or skin necrosis: No Has patient had a PCN reaction that required hospitalization No Has patient had a PCN reaction occurring within the last 10 years: No If all of the above answers are "NO", then may proceed with Cephalosporin use.   . Latex Itching  . Nylon Rash  . Sulfa Antibiotics Rash  . Sulfonamide Derivatives Rash  . Tape Itching    Paper tape is ok Paper tape is ok     PHYSICAL EXAM:  ECOG Performance status: 1  Vitals:   07/18/18 0810  BP: 140/76  Pulse: (!) 58  Resp: 16  Temp: 98.1 F (36.7 C)  SpO2: 97%   Filed Weights   07/18/18 0810  Weight: 250 lb 4.8 oz (113.5 kg)    Physical Exam Constitutional:      Appearance: Normal  appearance. She is normal weight.  Cardiovascular:     Rate and Rhythm: Normal rate and regular rhythm.     Heart sounds: Normal heart sounds.  Pulmonary:     Effort: Pulmonary effort is normal.     Breath sounds: Normal breath sounds.  Abdominal:     General: Bowel sounds are normal.     Palpations: Abdomen is soft.  Musculoskeletal: Normal range of motion.  Skin:    General:  Skin is warm and dry.  Neurological:     Mental Status: She is alert and oriented to person, place, and time. Mental status is at baseline.  Psychiatric:        Mood and Affect: Mood normal.        Behavior: Behavior normal.        Thought Content: Thought content normal.        Judgment: Judgment normal.      LABORATORY DATA:  I have reviewed the labs as listed.  CBC    Component Value Date/Time   WBC 5.1 07/13/2018 0856   RBC 5.08 07/13/2018 0856   HGB 14.4 07/13/2018 0856   HCT 45.9 07/13/2018 0856   PLT 269 07/13/2018 0856   MCV 90.4 07/13/2018 0856   MCH 28.3 07/13/2018 0856   MCHC 31.4 07/13/2018 0856   RDW 13.4 07/13/2018 0856   LYMPHSABS 1.9 07/13/2018 0856   MONOABS 0.7 07/13/2018 0856   EOSABS 0.1 07/13/2018 0856   BASOSABS 0.0 07/13/2018 0856   CMP Latest Ref Rng & Units 07/13/2018 01/07/2018 07/09/2017  Glucose 70 - 99 mg/dL 112(H) 101(H) 90  BUN 8 - 23 mg/dL 10 11 11   Creatinine 0.44 - 1.00 mg/dL 0.67 0.78 0.71  Sodium 135 - 145 mmol/L 143 140 140  Potassium 3.5 - 5.1 mmol/L 3.7 4.4 3.9  Chloride 98 - 111 mmol/L 109 108 109  CO2 22 - 32 mmol/L 26 26 25   Calcium 8.9 - 10.3 mg/dL 9.4 9.1 9.4  Total Protein 6.5 - 8.1 g/dL 7.4 7.4 7.5  Total Bilirubin 0.3 - 1.2 mg/dL 0.7 0.5 0.6  Alkaline Phos 38 - 126 U/L 80 77 75  AST 15 - 41 U/L 24 23 39  ALT 0 - 44 U/L 28 26 38       DIAGNOSTIC IMAGING:  I have independently reviewed the scans and discussed with the patient.   I have reviewed Francene Finders, NP's note and agree with the documentation.  I personally performed a  face-to-face visit, made revisions and my assessment and plan is as follows.    ASSESSMENT & PLAN:   GIST (gastrointestinal stromal tumor), malignant ([place]) 1.  T3N0 Gist of small bowel: -Status post resection on 01/05/2011, pathology showing 5.8 cm, 0/8 lymph nodes involved, margins negative, 1 mitosis/50 HPF, intermediate grade.  Presentation was with GI bleed. -Adrenal Med-Neb complicated by cardiac arrhythmias. -He does not report any abdominal pains, bleeding per rectum or melena. -Last CT CAP dated 01/31/2017 did not show any evidence of metastatic disease. - Physical exam today did not reveal any palpable masses.  She will come back in 6 months for follow-up.  2.  Recurrent DVT: -She was on warfarin for 32 years and was switched to Xarelto in August 2018. -She is tolerating very well. - She also has factor V Leiden heterozygosity..  3.  Pernicious anemia: -She is continuing vitamin B12 every 4 weeks.  B12 level today is 481.  Hemoglobin is normal at 14.4. - She is complaining of a drop in energy levels 2 weeks after each B12 injection.  I have told her to increase his B12 to every 3 weeks.  4.  Health maintenance: -Mammogram on 12/06/2017 was BI-RADS Category 1.       Orders placed this encounter:  Orders Placed This Encounter  Procedures  . Lactate dehydrogenase  . CBC with Differential/Platelet  . Comprehensive metabolic panel  . Ferritin  . Iron and TIBC  . Vitamin B12  .  VITAMIN D 25 Hydroxy (Vit-D Deficiency, Fractures)  . Folate      Derek Jack, MD [facility] [phone]

## 2018-08-04 ENCOUNTER — Other Ambulatory Visit: Payer: Self-pay | Admitting: Family Medicine

## 2018-08-04 DIAGNOSIS — I471 Supraventricular tachycardia: Secondary | ICD-10-CM

## 2018-08-05 IMAGING — DX DG FOREARM 2V*R*
2 series · 2 of 2 positions shown · non-contrast
Comparison: None.

CLINICAL DATA: Fall on outstretched hand

EXAM:
RIGHT FOREARM - 2 VIEW; RIGHT WRIST - COMPLETE 3+ VIEW

[forearm lat (1 of 2)]
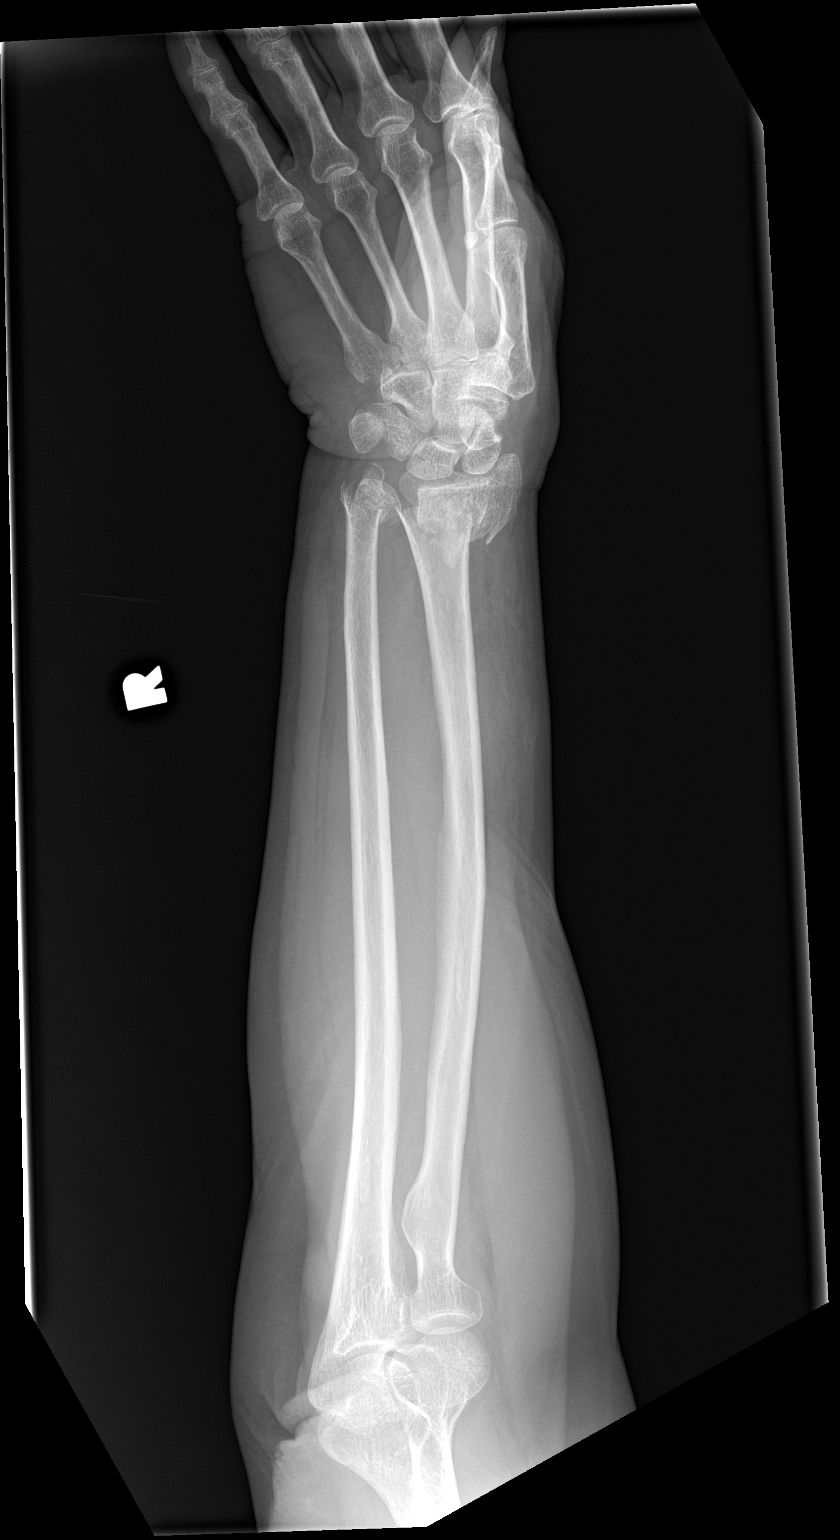

[forearm lat (2 of 2)]
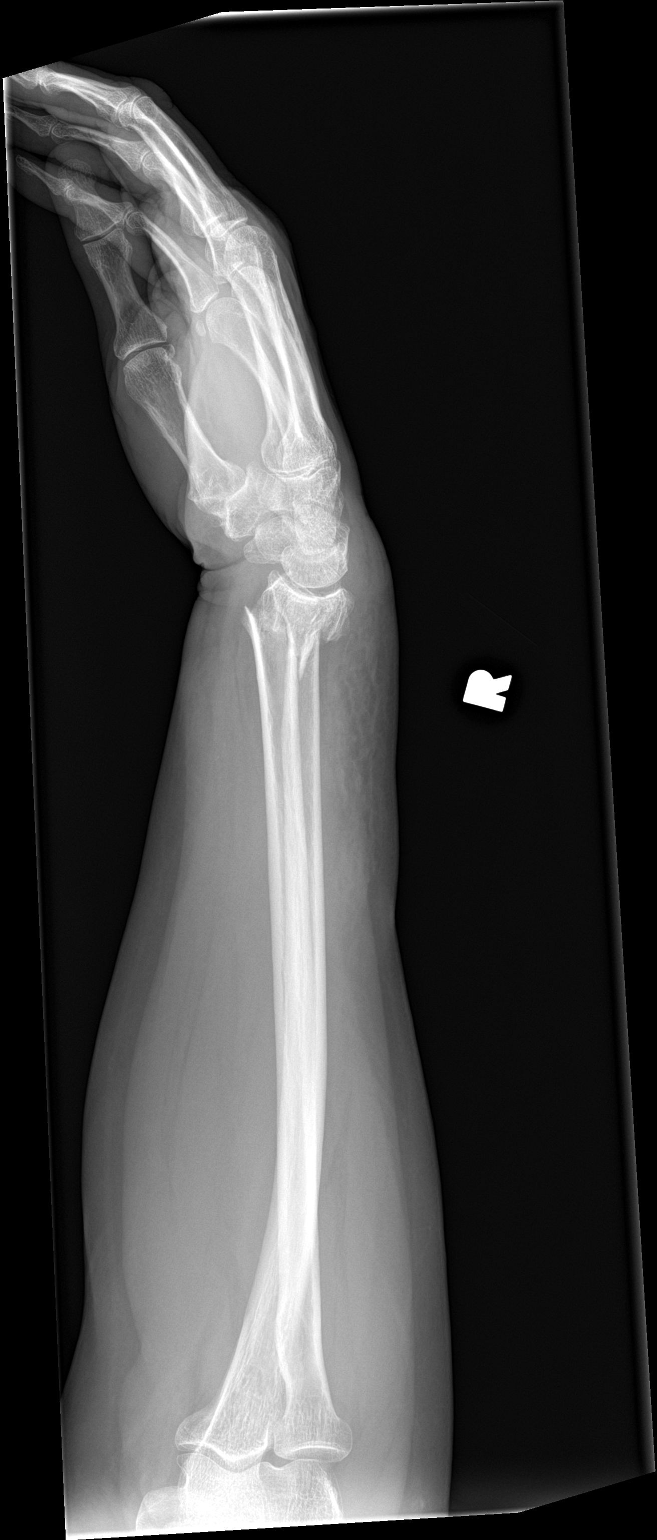

[2 of 2 positions shown; findings below may reference images not displayed]

FINDINGS: There are comminuted fractures of the distal right radius and ulna.
The radius fracture is predominantly transverse and does not cross
the articular surface. There is dorsal displacement and mild dorsal
angulation. The ulna fracture is obliquely oriented and extends to
the articular surface. There is lateral and dorsal displacement.
There is severe soft tissue swelling of the right wrist. No fracture
of the carpal bones or the visualized hand be seen.
IMPRESSION: Dorsally displaced comminuted fractures of the distal right radius
and ulna.

## 2018-10-17 ENCOUNTER — Encounter: Payer: Self-pay | Admitting: Family Medicine

## 2018-10-17 ENCOUNTER — Ambulatory Visit (INDEPENDENT_AMBULATORY_CARE_PROVIDER_SITE_OTHER): Payer: PPO | Admitting: Family Medicine

## 2018-10-17 ENCOUNTER — Other Ambulatory Visit: Payer: Self-pay

## 2018-10-17 DIAGNOSIS — H6592 Unspecified nonsuppurative otitis media, left ear: Secondary | ICD-10-CM

## 2018-10-17 MED ORDER — FLUTICASONE PROPIONATE 50 MCG/ACT NA SUSP: 2.0000 | Freq: Every day | NASAL | 6 refills | Status: DC

## 2018-10-17 MED ORDER — CETIRIZINE HCL 10 MG PO TABS: 10.0000 mg | ORAL_TABLET | Freq: Every day | ORAL | 11 refills | Status: DC

## 2018-10-17 NOTE — Progress Notes (Signed)
Virtual Visit via telephone Note Due to COVID-19 pandemic this visit was conducted virtually. This visit type was conducted due to national recommendations for restrictions regarding the COVID-19 Pandemic (e.g. social distancing, sheltering in place) in an effort to limit this patient's exposure and mitigate transmission in our community. All issues noted in this document were discussed and addressed.  A physical exam was not performed with this format.   I connected with [name] on 10/17/18 at [facility] by telephone and verified that I am speaking with the correct person using two identifiers. [name] is currently located at home and family is currently with them during visit. The provider, Monia Pouch, FNP is located in their office at time of visit.  I discussed the limitations, risks, security and privacy concerns of performing an evaluation and management service by telephone and the availability of in person appointments. I also discussed with the patient that there may be a patient responsible charge related to this service. The patient expressed understanding and agreed to proceed.  Subjective:  Patient ID: [name], female    DOB: 31-Mar-1950, 69 y.o.   MRN: [id number]  Chief Complaint:  Ear Fullness (left)   HPI: [name] is a 68 y.o. female presenting on 10/17/2018 for Ear Fullness (left)   Pt reports left ear fullness and intermittent tinnitus. No pain, fever, chills, congestion, cough, or sore throat. States this started about 3 days ago. She has not tried anything for the symptoms.   Ear Fullness  There is pain in the left ear. This is a new problem. The current episode started in the past 7 days. The problem occurs every few minutes. The problem has been unchanged. There has been no fever. The pain is at a severity of 0/10. The patient is experiencing no pain. Pertinent negatives include no abdominal pain, coughing, diarrhea, ear discharge, headaches,  hearing loss, neck pain, rash, rhinorrhea, sore throat or vomiting. She has tried nothing for the symptoms.     Relevant past medical, surgical, family, and social history reviewed and updated as indicated.  Allergies and medications reviewed and updated.   Past Medical History:  Diagnosis Date  . Allergic urticaria 01/04/2011   Rash from tape.  . Anxiety   . Cellulitis of left leg 2006  . Clotting disorder ([place])    heterozygosity from factor v leiden  . DVT (deep venous thrombosis) ([place]) 07/10/2010   on coumadin  . Dyspnea    with exertion  . Factor V Leiden ([place])   . GERD (gastroesophageal reflux disease)   . GIST (gastrointestinal stromal tumor), malignant ([place]) 01/03/2011   S/P resection on 01/05/11.  Intolerant to [employer]. Skin Cancer Left hand  . History of blood transfusion   . Hypertension   . Pernicious anemia 07/10/2010  . Small bowel mass 01/03/2011   s/p surgery  . Ulcer 05/2009   esophageal  . Ventricular tachycardia ([place]) 03/26/11  . Vitamin B12 deficiency    vit b12 1000 mcg monthly    Past Surgical History:  Procedure Laterality Date  . ABDOMINAL HYSTERECTOMY  1989  . BALLOON DILATION  12/11/2010   Procedure: BALLOON DILATION;  Surgeon: Rogene Houston, MD;  [facility];  Service: Endoscopy;  Laterality: N/A;  . BOWEL RESECTION  01/05/2011   Procedure: SMALL BOWEL RESECTION;  Surgeon: Jamesetta So;  Location: [facility];  Service: General;;  Partial Small Bowel Resection  . COLONOSCOPY  02/25/2012   Procedure: COLONOSCOPY;  Surgeon: Rogene Houston, MD;  [facility];  Service: Endoscopy;  Laterality: N/A;  1200  . GIVENS CAPSULE STUDY  01/02/2011   Procedure: GIVENS CAPSULE STUDY;  Surgeon: Rogene Houston, MD;  [facility];  Service: Endoscopy;  Laterality: N/A;  . LAPAROTOMY  01/05/2011   Procedure: EXPLORATORY LAPAROTOMY;  Surgeon: Jamesetta So;  Location: [facility];  Service: General;  Laterality: N/A;  . OPEN REDUCTION  INTERNAL FIXATION (ORIF) SCAPHOID WITH DISTAL RADIUS GRAFT Right 02/01/2016   Procedure: Right distal radius open reduction and internal fixation and repair as indicated;  Surgeon: Iran Planas, MD;  Location: [facility];  Service: Orthopedics;  Laterality: Right;  Requests 90 mins    Social History   Socioeconomic History  . Marital status: Married    Spouse name: Jeneen Rinks  . Number of children: 3  . Years of education: Not on file  . Highest education level: Not on file  Occupational History    Employer: [employer]  . Occupation: Forensic psychologist: [employer]  Social Needs  . Financial resource strain: Not hard at all  . Food insecurity    Worry: Never true    Inability: Never true  . Transportation needs    Medical: No    Non-medical: No  Tobacco Use  . Smoking status: Never Smoker  . Smokeless tobacco: Never Used  Substance and Sexual Activity  . Alcohol use: No  . Drug use: No  . Sexual activity: Not Currently  Lifestyle  . Physical activity    Days per week: 3 days    Minutes per session: 30 min  . Stress: Not at all  Relationships  . Social connections    Talks on phone: More than three times a week    Gets together: More than three times a week    Attends religious service: More than 4 times per year    Active member of club or organization: No    Attends meetings of clubs or organizations: Never    Relationship status: Married  . Intimate partner violence    Fear of current or ex partner: No    Emotionally abused: No    Physically abused: No    Forced sexual activity: No  Other Topics Concern  . Not on file  Social History Narrative  . Not on file    Outpatient Encounter Medications as of 10/17/2018  Medication Sig  . acetaminophen (TYLENOL) 500 MG tablet Take 500-1,000 mg by mouth every 6 (six) hours as needed for moderate pain.  . cetirizine (ZYRTEC) 10 MG tablet Take 1 tablet (10 mg total) by mouth daily.  . cyanocobalamin (,VITAMIN B-12,) 1000 MCG/ML  injection INJECT 1ML IM EVERY 30 DAYS  . diphenhydrAMINE (BENADRYL) 25 mg capsule Take 25 mg by mouth every 6 (six) hours as needed for allergies (bee stings).   . fluticasone (FLONASE) 50 MCG/ACT nasal spray Place 2 sprays into both nostrils daily.  . metoprolol tartrate (LOPRESSOR) 25 MG tablet TAKE 1/2 TABLET TWICE DAILY  . omeprazole (PRILOSEC) 20 MG capsule Take 20 mg by mouth daily.  Alveda Reasons 20 MG TABS tablet Take 1 tablet (20 mg total) by mouth daily with supper.   No facility-administered encounter medications on file as of 10/17/2018.     Allergies  Allergen Reactions  . Bee Venom Swelling and Rash    SWELLING REACTION UNSPECIFIED  SWELLING REACTION UNSPECIFIED   . Cephalexin Swelling    PATIENT WITH  Rx OF ANAPHYLAXIS TO PCN's SWELLING REACTION UNSPECIFIED  PATIENT WITH Rx OF ANAPHYLAXIS TO PCN's SWELLING REACTION UNSPECIFIED   . Dexlansoprazole Swelling    SWELLING REACTION UNSPECIFIED  SWELLING REACTION UNSPECIFIED   . Imatinib Other (See Comments)    Cardiac dysrhythmia  . Other Swelling    PECANS MOUTH SWELLS  . Penicillins Anaphylaxis    Has patient had a PCN reaction causing immediate rash, facial/tongue/throat swelling, SOB or lightheadedness with hypotension: No no Has patient had a PCN reaction causing severe rash involving mucus membranes or skin necrosis: No Has patient had a PCN reaction that required hospitalization No Has patient had a PCN reaction occurring within the last 10 years: No If all of the above answers are "NO", then may proceed with Cephalosporin use.   . Latex Itching  . Nylon Rash  . Sulfa Antibiotics Rash  . Sulfonamide Derivatives Rash  . Tape Itching    Paper tape is ok Paper tape is ok    Review of Systems  Constitutional: Negative for activity change, appetite change, chills, diaphoresis, fatigue, fever and unexpected weight change.  HENT: Positive for tinnitus. Negative for congestion, dental problem, drooling, ear discharge,  ear pain, facial swelling, hearing loss, nosebleeds, postnasal drip, rhinorrhea, sinus pressure, sinus pain, sneezing, sore throat, trouble swallowing and voice change.        Left ear fullness  Eyes: Negative.  Negative for photophobia and visual disturbance.  Respiratory: Negative for cough, chest tightness and shortness of breath.   Cardiovascular: Negative for chest pain, palpitations and leg swelling.  Gastrointestinal: Negative for abdominal pain, blood in stool, constipation, diarrhea, nausea and vomiting.  Endocrine: Negative.   Genitourinary: Negative for dysuria, frequency and urgency.  Musculoskeletal: Negative for arthralgias, myalgias and neck pain.  Skin: Negative.  Negative for pallor and rash.  Allergic/Immunologic: Negative.   Neurological: Negative for dizziness, tremors, seizures, syncope, facial asymmetry, speech difficulty, weakness, light-headedness, numbness and headaches.  Hematological: Negative.   Psychiatric/Behavioral: Negative for confusion, hallucinations, sleep disturbance and suicidal ideas.  All other systems reviewed and are negative.        Observations/Objective: No vital signs or physical exam, this was a telephone or virtual health encounter.  Pt alert and oriented, answers all questions appropriately, and able to speak in full sentences.    Assessment and Plan: Idonna was seen today for ear fullness.  Diagnoses and all orders for this visit:  Left otitis media with effusion Reported symptoms consistent with otitis media with effusion. No indications of acute bacterial infection. Symptomatic care discussed in detail. Will trial below to see if beneficial. Pt aware to report any new, worsening, or persistent symptoms. Follow up as needed.  -     fluticasone (FLONASE) 50 MCG/ACT nasal spray; Place 2 sprays into both nostrils daily. -     cetirizine (ZYRTEC) 10 MG tablet; Take 1 tablet (10 mg total) by mouth daily.     Follow Up Instructions:  Return if symptoms worsen or fail to improve.    I discussed the assessment and treatment plan with the patient. The patient was provided an opportunity to ask questions and all were answered. The patient agreed with the plan and demonstrated an understanding of the instructions.   The patient was advised to call back or seek an in-person evaluation if the symptoms worsen or if the condition fails to improve as anticipated.  The above assessment and management plan was discussed with the patient. The patient verbalized understanding of and has  agreed to the management plan. Patient is aware to call the clinic if they develop any new symptoms or if symptoms persist or worsen. Patient is aware when to return to the clinic for a follow-up visit. Patient educated on when it is appropriate to go to the emergency department.    I provided 10 minutes of non-face-to-face time during this encounter. The call started at [place]. The call ended at 5. The other time was used for coordination of care.    Monia Pouch, FNP-C [facility] [address] [place], [place] [address] [phone] 10/17/18

## 2018-10-18 ENCOUNTER — Ambulatory Visit: Payer: PPO | Admitting: Internal Medicine

## 2018-12-28 ENCOUNTER — Inpatient Hospital Stay (HOSPITAL_COMMUNITY): Payer: PPO | Attending: Hematology

## 2018-12-28 ENCOUNTER — Other Ambulatory Visit: Payer: Self-pay

## 2018-12-28 DIAGNOSIS — I10 Essential (primary) hypertension: Secondary | ICD-10-CM | POA: Diagnosis not present

## 2018-12-28 DIAGNOSIS — Z79899 Other long term (current) drug therapy: Secondary | ICD-10-CM | POA: Insufficient documentation

## 2018-12-28 DIAGNOSIS — Z7901 Long term (current) use of anticoagulants: Secondary | ICD-10-CM | POA: Insufficient documentation

## 2018-12-28 DIAGNOSIS — D510 Vitamin B12 deficiency anemia due to intrinsic factor deficiency: Secondary | ICD-10-CM | POA: Insufficient documentation

## 2018-12-28 DIAGNOSIS — Z86718 Personal history of other venous thrombosis and embolism: Secondary | ICD-10-CM | POA: Diagnosis not present

## 2018-12-28 DIAGNOSIS — C49A3 Gastrointestinal stromal tumor of small intestine: Secondary | ICD-10-CM | POA: Diagnosis not present

## 2018-12-28 DIAGNOSIS — D6851 Activated protein C resistance: Secondary | ICD-10-CM | POA: Insufficient documentation

## 2018-12-28 DIAGNOSIS — Z90710 Acquired absence of both cervix and uterus: Secondary | ICD-10-CM | POA: Insufficient documentation

## 2018-12-28 LAB — COMPREHENSIVE METABOLIC PANEL
ALT: 30 U/L (ref 0–44)
AST: 24 U/L (ref 15–41)
Albumin: 3.7 g/dL (ref 3.5–5.0)
Alkaline Phosphatase: 79 U/L (ref 38–126)
Anion gap: 9 (ref 5–15)
BUN: 12 mg/dL (ref 8–23)
CO2: 22 mmol/L (ref 22–32)
Calcium: 9.3 mg/dL (ref 8.9–10.3)
Chloride: 108 mmol/L (ref 98–111)
Creatinine, Ser: 0.6 mg/dL (ref 0.44–1.00)
GFR calc Af Amer: >60 mL/min (ref >60)
GFR calc non Af Amer: >60 mL/min (ref >60)
Glucose, Bld: 95 mg/dL (ref 70–99)
Potassium: 4.2 mmol/L (ref 3.5–5.1)
Sodium: 139 mmol/L (ref 135–145)
Total Bilirubin: 0.4 mg/dL (ref 0.3–1.2)
Total Protein: 7.4 g/dL (ref 6.5–8.1)

## 2018-12-28 LAB — CBC WITH DIFFERENTIAL/PLATELET
Abs Immature Granulocytes: 0.02 10*3/uL (ref 0.00–0.07)
Basophils Absolute: 0 10*3/uL (ref 0.0–0.1)
Basophils Relative: 1 %
Eosinophils Absolute: 0.2 10*3/uL (ref 0.0–0.5)
Eosinophils Relative: 4 %
HCT: 44.7 % (ref 36.0–46.0)
Hemoglobin: 13.9 g/dL (ref 12.0–15.0)
Immature Granulocytes: 0 %
Lymphocytes Relative: 38 %
Lymphs Abs: 2.3 10*3/uL (ref 0.7–4.0)
MCH: 28.1 pg (ref 26.0–34.0)
MCHC: 31.1 g/dL (ref 30.0–36.0)
MCV: 90.5 fL (ref 80.0–100.0)
Monocytes Absolute: 0.7 10*3/uL (ref 0.1–1.0)
Monocytes Relative: 11 %
Neutro Abs: 2.8 10*3/uL (ref 1.7–7.7)
Neutrophils Relative %: 46 %
Platelets: 294 10*3/uL (ref 150–400)
RBC: 4.94 MIL/uL (ref 3.87–5.11)
RDW: 13.1 % (ref 11.5–15.5)
WBC: 6.1 10*3/uL (ref 4.0–10.5)
nRBC: 0 % (ref 0.0–0.2)

## 2018-12-28 LAB — LACTATE DEHYDROGENASE: LDH: 138 U/L (ref 98–192)

## 2018-12-28 LAB — VITAMIN B12: Vitamin B-12: 416 pg/mL (ref 180–914)

## 2018-12-28 LAB — VITAMIN D 25 HYDROXY (VIT D DEFICIENCY, FRACTURES): Vit D, 25-Hydroxy: 30.03 ng/mL (ref 30–100)

## 2018-12-28 LAB — FERRITIN: Ferritin: 49 ng/mL (ref 11–307)

## 2018-12-28 LAB — IRON AND TIBC
Iron: 87 ug/dL (ref 28–170)
Saturation Ratios: 28 % (ref 10.4–31.8)
TIBC: 307 ug/dL (ref 250–450)
UIBC: 220 ug/dL

## 2018-12-28 LAB — FOLATE: Folate: 13.6 ng/mL (ref >5.9)

## 2019-01-03 ENCOUNTER — Inpatient Hospital Stay (HOSPITAL_BASED_OUTPATIENT_CLINIC_OR_DEPARTMENT_OTHER): Payer: PPO | Admitting: Hematology

## 2019-01-03 ENCOUNTER — Encounter (HOSPITAL_COMMUNITY): Payer: Self-pay | Admitting: Hematology

## 2019-01-03 ENCOUNTER — Other Ambulatory Visit: Payer: Self-pay

## 2019-01-03 VITALS — BP 158/74 | HR 64 | Temp 96.9°F | Resp 18 | Wt 247.0 lb

## 2019-01-03 DIAGNOSIS — C49A3 Gastrointestinal stromal tumor of small intestine: Secondary | ICD-10-CM

## 2019-01-03 NOTE — Progress Notes (Signed)
[name], [place] [address]   CLINIC:  Medical Oncology/Hematology  PCP:  [name], [name]  [id number] [phone]   REASON FOR VISIT: Follow-up for Heterozygous factor V leiden mutation with RECURRENT DVT AND Malignant gastrointestinal stromal tumor (GIST) of small intestine   CURRENT THERAPY: Xarelto AND Observation  BRIEF ONCOLOGIC HISTORY:  Oncology History  GIST (gastrointestinal stromal tumor), malignant ([place])  01/03/2011 Initial Diagnosis   GIST (gastrointestinal stromal tumor), malignant ([place])   01/03/2011 Imaging   CT abd/pelvis- 5.0 cm soft tissue mass associated with distal small bowel. Findings characteristic of gastrointestinal stromal tumor. Adenocarcinoma or malignant transformation are not excluded.  Stable left adrenal adenoma.   04/03/2011 Imaging   CT abd/pelvis- Small bowel obstruction at small bowel anastomosis. Single loop of small bowel in the left mid abdomen shows wall thickening, nonspecific; this can be seen with infection, inflammatory bowel disease and ischemia. Free intraperitoneal fluid. Small right pleural effusion and bibasilar atelectasis. Left adrenal adenoma.   01/25/2012 Imaging   CT abd/pelvis- Prior small bowel resection with anastomoses in the left mid abdomen.  No evidence of metastatic disease in the abdomen/pelvis.  No evidence of bowel obstruction.  Focal eccentric wall thickening along the lateral aspect of the cecum.  Colonoscopy is suggested to exclude a primary colonic neoplasm.    01/25/2013 Imaging   CT abd/pelvis- 1. Thickening through the gastric cardiac region likely represents redundant folds of normal mucosa. Recommend attention on follow-up. 2. Mild haziness to the central mesentery is similar to and prior likely related to prior bowel surgery. No evidence of GIST recurrence within the small bowel. 3. No evidence of adenopathy or mass in  the abdomen or pelvis. 4. Stable left adrenal adenoma. 5. 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   01/15/2014 Imaging   CT abd/pelvis- Stable exam. No evidence for recurrent disease in the abdomen or pelvis.  Stable left adrenal adenoma.  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   01/10/2015 Imaging   CT abd/pelvis- 1. Stable exam. No evidence for recurrent disease within the abdomen or pelvis. 2. Stable left adrenal gland adenoma. 3. 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   01/23/2015 Imaging   CT abd/pelvis- Small bowel obstruction with a transition zone just distal to the ileo ileal anastomosis in the anterior pelvis and secondary to peritoneal adhesions.      Macdonald STAGING: Macdonald Staging GIST (gastrointestinal stromal tumor), malignant ([place]) Staging form: Soft Tissue Sarcoma, AJCC 7th Edition - Clinical: Stage IIB (T2, N0, M0) - Signed by [name] on 01/29/2011    INTERVAL HISTORY:  [name] 68 y.o. female seen for follow-up of GIST tumor of the small bowel and recurrent DVT and pernicious anemia.  Denies any change in her bowel habits.  Denies any nausea, vomiting, diarrhea.  Denies any abdominal pains.  Appetite and energy levels in 100%.  She is taking Xarelto without any bleeding issues.  Denies any fevers, night sweats or weight loss.  She delayed her mammogram secondary to COVID-19.    REVIEW OF SYSTEMS:  Review of Systems  All other systems reviewed and are negative.    PAST MEDICAL/SURGICAL HISTORY:  Past Medical History:  Diagnosis Date  . Allergic urticaria 01/04/2011   Rash from tape.  . Anxiety   . Cellulitis of left leg 2006  . Clotting  disorder ([place])    heterozygosity from factor v leiden  . DVT (deep venous thrombosis) ([place]) 07/10/2010   on coumadin  . Dyspnea    with exertion  . Factor V Leiden ([place])   . GERD (gastroesophageal  reflux disease)   . GIST (gastrointestinal stromal tumor), malignant ([place]) 01/03/2011   S/P resection on 01/05/11.  Intolerant to [employer]. Skin Macdonald Left hand  . History of blood transfusion   . Hypertension   . Pernicious anemia 07/10/2010  . Small bowel mass 01/03/2011   s/p surgery  . Ulcer 05/2009   esophageal  . Ventricular tachycardia ([place]) 03/26/11  . Vitamin B12 deficiency    vit b12 1000 mcg monthly   Past Surgical History:  Procedure Laterality Date  . ABDOMINAL HYSTERECTOMY  1989  . BALLOON DILATION  12/11/2010   Procedure: BALLOON DILATION;  Surgeon: Denise Houston, MD;  [facility];  Service: Endoscopy;  Laterality: N/A;  . BOWEL RESECTION  01/05/2011   Procedure: SMALL BOWEL RESECTION;  Surgeon: [name];  Location[facility];  Service: General;;  Partial Small Bowel Resection  . COLONOSCOPY  02/25/2012   Procedure: COLONOSCOPY;  Surgeon: Denise Houston, MD;  [facility];  Service: Endoscopy;  Laterality: N/A;  1200  . GIVENS CAPSULE STUDY  01/02/2011   Procedure: GIVENS CAPSULE STUDY;  Surgeon: Denise Houston, MD;  [facility];  Service: Endoscopy;  Laterality: N/A;  . LAPAROTOMY  01/05/2011   Procedure: EXPLORATORY LAPAROTOMY;  Surgeon: [name];  Location[facility];  Service: General;  Laterality: N/A;  . OPEN REDUCTION INTERNAL FIXATION (ORIF) SCAPHOID WITH DISTAL RADIUS GRAFT Right 02/01/2016   Procedure: Right distal radius open reduction and internal fixation and repair as indicated;  Surgeon: Denise Planas, MD;  Location: [facility];  Service: Orthopedics;  Laterality: Right;  Requests 90 mins     SOCIAL HISTORY:  Social History   Socioeconomic History  . Marital status: Married    Spouse name: [name]  . Number of children: 3  . Years of education: Not on file  . Highest education level: Not on file  Occupational History    Employer: [employer]  . Occupation: Forensic psychologist: [employer]  Social Needs  . Financial  resource strain: Not hard at all  . Food insecurity    Worry: Never true    Inability: Never true  . Transportation needs    Medical: No    Non-medical: No  Tobacco Use  . Smoking status: Never Smoker  . Smokeless tobacco: Never Used  Substance and Sexual Activity  . Alcohol use: No  . Drug use: No  . Sexual activity: Not Currently  Lifestyle  . Physical activity    Days per week: 3 days    Minutes per session: 30 min  . Stress: Not at all  Relationships  . Social connections    Talks on phone: More than three times a week    Gets together: More than three times a week    Attends religious service: More than 4 times per year    Active member of club or organization: No    Attends meetings of clubs or organizations: Never    Relationship status: Married  . Intimate partner violence    Fear of current or ex partner: No    Emotionally abused: No    Physically abused: No    Forced sexual activity: No  Other Topics Concern  .  Not on file  Social History Narrative  . Not on file    FAMILY HISTORY:  Family History  Problem Relation Age of Onset  . Depression Mother   . Parkinson's disease Mother     CURRENT MEDICATIONS:  Outpatient Encounter Medications as of 01/03/2019  Medication Sig  . cetirizine (ZYRTEC) 10 MG tablet Take 1 tablet (10 mg total) by mouth daily.  . cyanocobalamin (,VITAMIN B-12,) 1000 MCG/ML injection INJECT 1ML IM EVERY 30 DAYS  . metoprolol tartrate (LOPRESSOR) 25 MG tablet TAKE 1/2 TABLET TWICE DAILY  . omeprazole (PRILOSEC) 20 MG capsule Take 20 mg by mouth daily.  Alveda Reasons 20 MG TABS tablet Take 1 tablet (20 mg total) by mouth daily with supper.  Marland Kitchen acetaminophen (TYLENOL) 500 MG tablet Take 500-1,000 mg by mouth every 6 (six) hours as needed for moderate pain.  . diphenhydrAMINE (BENADRYL) 25 mg capsule Take 25 mg by mouth every 6 (six) hours as needed for allergies (bee stings).   . fluticasone (FLONASE) 50 MCG/ACT nasal spray Place 2 sprays  into both nostrils daily. (Patient not taking: Reported on 01/03/2019)   No facility-administered encounter medications on file as of 01/03/2019.     ALLERGIES:  Allergies  Allergen Reactions  . Bee Venom Swelling and Rash    SWELLING REACTION UNSPECIFIED  SWELLING REACTION UNSPECIFIED   . Cephalexin Swelling    PATIENT WITH Rx OF ANAPHYLAXIS TO PCN's SWELLING REACTION UNSPECIFIED  PATIENT WITH Rx OF ANAPHYLAXIS TO PCN's SWELLING REACTION UNSPECIFIED   . Dexlansoprazole Swelling    SWELLING REACTION UNSPECIFIED  SWELLING REACTION UNSPECIFIED   . Imatinib Other (See Comments)    Cardiac dysrhythmia  . Other Swelling    PECANS MOUTH SWELLS  . Penicillins Anaphylaxis    Has patient had a PCN reaction causing immediate rash, facial/tongue/throat swelling, SOB or lightheadedness with hypotension: No no Has patient had a PCN reaction causing severe rash involving mucus membranes or skin necrosis: No Has patient had a PCN reaction that required hospitalization No Has patient had a PCN reaction occurring within the last 10 years: No If all of the above answers are "NO", then may proceed with Cephalosporin use.   . Latex Itching  . Nylon Rash  . Sulfa Antibiotics Rash  . Sulfonamide Derivatives Rash  . Tape Itching    Paper tape is ok Paper tape is ok     PHYSICAL EXAM:  ECOG Performance status: 1  Vitals:   01/03/19 0826  BP: (!) 158/74  Pulse: 64  Resp: 18  Temp: (!) 96.9 F (36.1 C)  SpO2: 94%   Filed Weights   01/03/19 0826  Weight: 247 lb (112 kg)    Physical Exam Constitutional:      Appearance: Normal appearance. She is normal weight.  Cardiovascular:     Rate and Rhythm: Normal rate and regular rhythm.     Heart sounds: Normal heart sounds.  Pulmonary:     Effort: Pulmonary effort is normal.     Breath sounds: Normal breath sounds.  Abdominal:     General: There is no distension.     Palpations: Abdomen is soft. There is no mass.  Musculoskeletal:  Normal range of motion.  Skin:    General: Skin is warm and dry.  Neurological:     General: No focal deficit present.     Mental Status: She is alert and oriented to person, place, and time. Mental status is at baseline.  Psychiatric:  Mood and Affect: Mood normal.        Behavior: Behavior normal.        Thought Content: Thought content normal.        Judgment: Judgment normal.      LABORATORY DATA:  I have reviewed the labs as listed.  CBC    Component Value Date/Time   WBC 6.1 12/28/2018 0952   RBC 4.94 12/28/2018 0952   HGB 13.9 12/28/2018 0952   HCT 44.7 12/28/2018 0952   PLT 294 12/28/2018 0952   MCV 90.5 12/28/2018 0952   MCH 28.1 12/28/2018 0952   MCHC 31.1 12/28/2018 0952   RDW 13.1 12/28/2018 0952   LYMPHSABS 2.3 12/28/2018 0952   MONOABS 0.7 12/28/2018 0952   EOSABS 0.2 12/28/2018 0952   BASOSABS 0.0 12/28/2018 0952   CMP Latest Ref Rng & Units 12/28/2018 07/13/2018 01/07/2018  Glucose 70 - 99 mg/dL 95 112(H) 101(H)  BUN 8 - 23 mg/dL 12 10 11   Creatinine 0.44 - 1.00 mg/dL 0.60 0.67 0.78  Sodium 135 - 145 mmol/L 139 143 140  Potassium 3.5 - 5.1 mmol/L 4.2 3.7 4.4  Chloride 98 - 111 mmol/L 108 109 108  CO2 22 - 32 mmol/L 22 26 26   Calcium 8.9 - 10.3 mg/dL 9.3 9.4 9.1  Total Protein 6.5 - 8.1 g/dL 7.4 7.4 7.4  Total Bilirubin 0.3 - 1.2 mg/dL 0.4 0.7 0.5  Alkaline Phos 38 - 126 U/L 79 80 77  AST 15 - 41 U/L 24 24 23   ALT 0 - 44 U/L 30 28 26        DIAGNOSTIC IMAGING:  I have independently reviewed the scans and discussed with the patient.     ASSESSMENT & PLAN:   GIST (gastrointestinal stromal tumor), malignant ([place]) 1.  T3N0 Gist of small bowel: -Status post resection on 01/05/2011, pathology showing 5.8 cm, 0/8 lymph nodes involved, margins negative, 1 mitosis/50 HPF, intermediate grade.  Presentation was with GI bleed. -Adjuvant imatinib complicated by cardiac arrhythmias. -Last CT CAP dated 01/31/2017 did not show any evidence of  metastatic disease. -Physical exam today did not reveal any palpable masses.  She does not have any clinical signs or symptoms of recurrence. -We have reviewed her blood work.  She will come back in 6 months for follow-up.  2.  Recurrent DVT: -She was on warfarin for 32 years and was switched to Xarelto in August 2018. -She is tolerating very well. - She also has factor V Leiden heterozygosity..  3.  Pernicious anemia: -She is continuing B12 injection every 3 weeks.  B12 level is between 400-500.  4.  Health maintenance: -Mammogram on 12/06/2017 was BI-RADS Category 1. -She will have her mammogram scheduled soon.  It was delayed due to COVID-19.       Orders placed this encounter:  Orders Placed This Encounter  Procedures  . CBC with Differential/Platelet  . Comprehensive metabolic panel  . Iron and TIBC  . Ferritin  . Vitamin B12  . Lactate dehydrogenase      Derek Jack, MD [place] [phone]

## 2019-01-03 NOTE — Patient Instructions (Addendum)
[facility] at [facility] Discharge Instructions  You were seen today by Dr. Katragadda. He went over your recent lab results. He will see you back in 6 months for labs and follow up.   Thank you for choosing [facility] at [facility] to provide your oncology and hematology care.  To afford each patient quality time with our provider, please arrive at least 15 minutes before your scheduled appointment time.   If you have a lab appointment with the Cancer Center please come in thru the  Main Entrance and check in at the main information desk  You need to re-schedule your appointment should you arrive 10 or more minutes late.  We strive to give you quality time with our providers, and arriving late affects you and other patients whose appointments are after yours.  Also, if you no show three or more times for appointments you may be dismissed from the clinic at the providers discretion.     Again, thank you for choosing [facility].  Our hope is that these requests will decrease the amount of time that you wait before being seen by our physicians.       _____________________________________________________________  Should you have questions after your visit to [facility], please contact our office at [phone] between the hours of 8:00 a.m. and 4:30 p.m.  Voicemails left after 4:00 p.m. will not be returned until the following business day.  For prescription refill requests, have your pharmacy contact our office and allow 72 hours.    Cancer Center Support Programs:   > Cancer Support Group  2nd Tuesday of the month 1pm-2pm, Journey Room    

## 2019-01-03 NOTE — Assessment & Plan Note (Signed)
1.  T3N0 Gist of small bowel: -Status post resection on 01/05/2011, pathology showing 5.8 cm, 0/8 lymph nodes involved, margins negative, 1 mitosis/50 HPF, intermediate grade.  Presentation was with GI bleed. -Adjuvant imatinib complicated by cardiac arrhythmias. -Last CT CAP dated 01/31/2017 did not show any evidence of metastatic disease. -Physical exam today did not reveal any palpable masses.  She does not have any clinical signs or symptoms of recurrence. -We have reviewed her blood work.  She will come back in 6 months for follow-up.  2.  Recurrent DVT: -She was on warfarin for 32 years and was switched to Xarelto in August 2018. -She is tolerating very well. - She also has factor V Leiden heterozygosity..  3.  Pernicious anemia: -She is continuing B12 injection every 3 weeks.  B12 level is between 400-500.  4.  Health maintenance: -Mammogram on 12/06/2017 was BI-RADS Category 1. -She will have her mammogram scheduled soon.  It was delayed due to COVID-19.

## 2019-01-10 ENCOUNTER — Other Ambulatory Visit (HOSPITAL_COMMUNITY): Payer: PPO

## 2019-01-30 ENCOUNTER — Other Ambulatory Visit: Payer: Self-pay | Admitting: Family Medicine

## 2019-01-30 DIAGNOSIS — I471 Supraventricular tachycardia: Secondary | ICD-10-CM

## 2019-04-11 ENCOUNTER — Telehealth: Payer: Self-pay | Admitting: Family Medicine

## 2019-04-11 NOTE — Chronic Care Management (AMB) (Signed)
  Chronic Care Management   Note  04/11/2019 Name: [name] MRN: [id number] DOB: Feb 12, 1950  [name] is a 69 y.o. year old female who is a primary care patient of Rakes, Connye Burkitt, FNP. I reached out to [name] by phone today in response to a referral sent by [name]'s health plan.     [name] was given information about Chronic Care Management services today including:  1. CCM service includes personalized support from designated clinical staff supervised by her physician, including individualized plan of care and coordination with other care providers 2. 24/7 contact phone numbers for assistance for urgent and routine care needs. 3. Service will only be billed when office clinical staff spend 20 minutes or more in a month to coordinate care. 4. Only one practitioner may furnish and bill the service in a calendar month. 5. The patient may stop CCM services at any time (effective at the end of the month) by phone call to the office staff. 6. The patient will be responsible for cost sharing (co-pay) of up to 20% of the service fee (after annual deductible is met).  Patient did not agree to enrollment in care management services and does not wish to consider at this time.  Follow up plan: The care management team is available to follow up with the patient after provider conversation with the patient regarding recommendation for care management engagement and subsequent re-referral to the care management team.   [place], [place], [place] [address] Direct Dial: [email or web address] Website: [email or web address]

## 2019-04-12 ENCOUNTER — Other Ambulatory Visit (HOSPITAL_COMMUNITY): Payer: Self-pay | Admitting: Hematology

## 2019-04-12 DIAGNOSIS — Z1231 Encounter for screening mammogram for malignant neoplasm of breast: Secondary | ICD-10-CM

## 2019-04-27 ENCOUNTER — Other Ambulatory Visit (HOSPITAL_COMMUNITY): Payer: Self-pay | Admitting: Hematology

## 2019-04-27 DIAGNOSIS — D510 Vitamin B12 deficiency anemia due to intrinsic factor deficiency: Secondary | ICD-10-CM

## 2019-04-27 DIAGNOSIS — Z7901 Long term (current) use of anticoagulants: Secondary | ICD-10-CM

## 2019-04-27 DIAGNOSIS — I82402 Acute embolism and thrombosis of unspecified deep veins of left lower extremity: Secondary | ICD-10-CM

## 2019-05-02 ENCOUNTER — Telehealth: Payer: Self-pay | Admitting: Family Medicine

## 2019-05-02 DIAGNOSIS — I471 Supraventricular tachycardia: Secondary | ICD-10-CM

## 2019-05-02 MED ORDER — METOPROLOL TARTRATE 25 MG PO TABS: 12.5000 mg | ORAL_TABLET | Freq: Two times a day (BID) | ORAL | 0 refills | Status: DC

## 2019-05-02 NOTE — Telephone Encounter (Signed)
Meds sent in

## 2019-05-02 NOTE — Telephone Encounter (Signed)
°  Prescription Request  05/02/2019  What is the name of the medication or equipment? Metoprolol  Have you contacted your pharmacy to request a refill? (if applicable) Yes  Which pharmacy would you like [facility] sent [facility] [place]  Pt says she only has 2 pills left and needs refill sent to pharmacy. Scheduled med refill appt with MMM on 05/11/19.   Patient notified that their request is being sent to the clinical staff for review and that they should receive a response within 2 business days.

## 2019-05-11 ENCOUNTER — Encounter: Payer: Self-pay | Admitting: Nurse Practitioner

## 2019-05-11 ENCOUNTER — Other Ambulatory Visit: Payer: Self-pay

## 2019-05-11 ENCOUNTER — Ambulatory Visit (INDEPENDENT_AMBULATORY_CARE_PROVIDER_SITE_OTHER): Payer: PPO | Admitting: Nurse Practitioner

## 2019-05-11 VITALS — BP 144/78 | HR 60 | Temp 98.3°F | Resp 20 | Ht 67.0 in | Wt 248.0 lb

## 2019-05-11 DIAGNOSIS — C49A3 Gastrointestinal stromal tumor of small intestine: Secondary | ICD-10-CM

## 2019-05-11 DIAGNOSIS — I82522 Chronic embolism and thrombosis of left iliac vein: Secondary | ICD-10-CM | POA: Diagnosis not present

## 2019-05-11 DIAGNOSIS — L97922 Non-pressure chronic ulcer of unspecified part of left lower leg with fat layer exposed: Secondary | ICD-10-CM

## 2019-05-11 DIAGNOSIS — D508 Other iron deficiency anemias: Secondary | ICD-10-CM | POA: Diagnosis not present

## 2019-05-11 DIAGNOSIS — Z7901 Long term (current) use of anticoagulants: Secondary | ICD-10-CM

## 2019-05-11 DIAGNOSIS — K219 Gastro-esophageal reflux disease without esophagitis: Secondary | ICD-10-CM

## 2019-05-11 DIAGNOSIS — F3341 Major depressive disorder, recurrent, in partial remission: Secondary | ICD-10-CM | POA: Diagnosis not present

## 2019-05-11 DIAGNOSIS — D510 Vitamin B12 deficiency anemia due to intrinsic factor deficiency: Secondary | ICD-10-CM

## 2019-05-11 DIAGNOSIS — I471 Supraventricular tachycardia: Secondary | ICD-10-CM | POA: Diagnosis not present

## 2019-05-11 MED ORDER — METOPROLOL TARTRATE 25 MG PO TABS: 12.5000 mg | ORAL_TABLET | Freq: Two times a day (BID) | ORAL | 0 refills | Status: DC

## 2019-05-11 MED ORDER — OMEPRAZOLE 20 MG PO CPDR: 20.0000 mg | DELAYED_RELEASE_CAPSULE | Freq: Every day | ORAL | 1 refills | Status: DC

## 2019-05-11 NOTE — Patient Instructions (Signed)
Stress, Adult Stress is a normal reaction to life events. Stress is what you feel when life demands more than you are used to, or more than you think you can handle. Some stress can be useful, such as studying for a test or meeting a deadline at work. Stress that occurs too often or for too long can cause problems. It can affect your emotional health and interfere with relationships and normal daily activities. Too much stress can weaken your body's defense system (immune system) and increase your risk for physical illness. If you already have a medical problem, stress can make it worse. What are the causes? All sorts of life events can cause stress. An event that causes stress for one person may not be stressful for another person. Major life events, whether positive or negative, commonly cause stress. Examples include:  Losing a job or starting a new job.  Losing a loved one.  Moving to a new town or home.  Getting married or divorced.  Having a baby.  Getting injured or sick. Less obvious life events can also cause stress, especially if they occur day after day or in combination with each other. Examples include:  Working long hours.  Driving in traffic.  Caring for children.  Being in debt.  Being in a difficult relationship. What are the signs or symptoms? Stress can cause emotional symptoms, including:  Anxiety. This is feeling worried, afraid, on edge, overwhelmed, or out of control.  Anger, including irritation or impatience.  Depression. This is feeling sad, down, helpless, or guilty.  Trouble focusing, remembering, or making decisions. Stress can cause physical symptoms, including:  Aches and pains. These may affect your head, neck, back, stomach, or other areas of your body.  Tight muscles or a clenched jaw.  Low energy.  Trouble sleeping. Stress can cause unhealthy behaviors, including:  Eating to feel better (overeating) or skipping meals.  Working too  much or putting off tasks.  Smoking, drinking alcohol, or using drugs to feel better. How is this diagnosed? Stress is diagnosed through an assessment by your health care provider. He or she may diagnose this condition based on:  Your symptoms and any stressful life events.  Your medical history.  Tests to rule out other causes of your symptoms. Depending on your condition, your health care provider may refer you to a specialist for further evaluation. How is this treated?  Stress management techniques are the recommended treatment for stress. Medicine is not typically recommended for the treatment of stress. Techniques to reduce your reaction to stressful life events include:  Stress identification. Monitor yourself for symptoms of stress and identify what causes stress for you. These skills may help you to avoid or prepare for stressful events.  Time management. Set your priorities, keep a calendar of events, and learn to say no. Taking these actions can help you avoid making too many commitments. Techniques for coping with stress include:  Rethinking the problem. Try to think realistically about stressful events rather than ignoring them or overreacting. Try to find the positives in a stressful situation rather than focusing on the negatives.  Exercise. Physical exercise can release both physical and emotional tension. The key is to find a form of exercise that you enjoy and do it regularly.  Relaxation techniques. These relax the body and mind. The key is to find one or more that you enjoy and use the techniques regularly. Examples include: ? Meditation, deep breathing, or progressive relaxation techniques. ? Yoga or   tai chi. ? Biofeedback, mindfulness techniques, or journaling. ? Listening to music, being out in nature, or participating in other hobbies.  Practicing a healthy lifestyle. Eat a balanced diet, drink plenty of water, limit or avoid caffeine, and get plenty of  sleep.  Having a strong support network. Spend time with family, friends, or other people you enjoy being around. Express your feelings and talk things over with someone you trust. Counseling or talk therapy with a mental health professional may be helpful if you are having trouble managing stress on your own. Follow these instructions at home: Lifestyle   Avoid drugs.  Do not use any products that contain nicotine or tobacco, such as cigarettes, e-cigarettes, and chewing tobacco. If you need help quitting, ask your health care provider.  Limit alcohol intake to no more than 1 drink a day for nonpregnant women and 2 drinks a day for men. One drink equals 12 oz of beer, 5 oz of wine, or 1 oz of hard liquor  Do not use alcohol or drugs to relax.  Eat a balanced diet that includes fresh fruits and vegetables, whole grains, lean meats, fish, eggs, and beans, and low-fat dairy. Avoid processed foods and foods high in added fat, sugar, and salt.  Exercise at least 30 minutes on 5 or more days each week.  Get 7-8 hours of sleep each night. General instructions   Practice stress management techniques as discussed with your health care provider.  Drink enough fluid to keep your urine clear or pale yellow.  Take over-the-counter and prescription medicines only as told by your health care provider.  Keep all follow-up visits as told by your health care provider. This is important. Contact a health care provider if:  Your symptoms get worse.  You have new symptoms.  You feel overwhelmed by your problems and can no longer manage them on your own. Get help right away if:  You have thoughts of hurting yourself or others. If you ever feel like you may hurt yourself or others, or have thoughts about taking your own life, get help right away. You can go to your nearest emergency department or call:  Your local emergency services (911 in the [place]  A suicide crisis helpline, such as the  [place] at (316) 250-6172. This is open 24 hours a day. Summary  Stress is a normal reaction to life events. It can cause problems if it happens too often or for too long.  Practicing stress management techniques is the best way to treat stress.  Counseling or talk therapy with a mental health professional may be helpful if you are having trouble managing stress on your own. This information is not intended to replace advice given to you by your health care provider. Make sure you discuss any questions you have with your health care provider. Document Revised: 08/12/2018 Document Reviewed: 03/04/2016 Elsevier Patient Education  [place].

## 2019-05-11 NOTE — Progress Notes (Signed)
Subjective:    Patient ID: [name], female    DOB: 03/03/1950, 69 y.o.   MRN: [id number]   Chief Complaint: Medical Management of Chronic Issues    HPI:  1. Chronic deep vein thrombosis (DVT) of iliac vein of left lower extremity ([name]) She had several DVT in the past, so she is on xeralto for life now.   2. SVT (supraventricular tachycardia) ([place]) She deniss any palpitaions or heart racing. She is on metoprolol and is doing well.  3. 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 She goes to  Oncology 2x a year. She has not had recoccurence.  4. Chronic anticoagulation Is on xeralto but denies nay bleeding problems.  5. Chronic ulcer of left lower extremity with fat layer exposed ([place]) She has had wound care and has gotten bettr. Has not had reoccurence.  6. Other iron deficiency anemia She is no longer on iron supplement. Oncology stopped it. We will repeat labs today.  7. Pernicious anemia She is on b12 injections that she gives to herself every 3 weeks. Lab Results  Component Value Date   VITAMINB12 416 12/28/2018     8. Recurrent major depressive disorder, in partial remission ([place]) Is not on any antidepressants currently. Depression screen [facility] 2/9 05/11/2019 04/13/2017 03/26/2017  Decreased Interest 0 1 1  Down, Depressed, Hopeless 0 0 1  PHQ - 2 Score 0 1 2  Altered sleeping - 1 3  Tired, decreased energy - 1 3  Change in appetite - 1 2  Feeling bad or failure about yourself  - 0 0  Trouble concentrating - 0 0  Moving slowly or fidgety/restless - 0 0  Suicidal thoughts - 0 0  PHQ-9 Score - 4 10  Difficult doing work/chores - - Somewhat difficult  Some recent data might be hidden   9. GERD Is on omeprazole daily and is doing well  10. BMI 38.0-38.9 No recent weight changes  BMI Readings from Last 3 Encounters:  05/11/19 38.84 kg/m  01/03/19 38.11 kg/m  07/18/18 38.62 kg/m   Wt Readings from  Last 3 Encounters:  05/11/19 248 lb (112.5 kg)  01/03/19 247 lb (112 kg)  07/18/18 250 lb 4.8 oz (113.5 kg)        Outpatient Encounter Medications as of 05/11/2019  Medication Sig  . acetaminophen (TYLENOL) 500 MG tablet Take 500-1,000 mg by mouth every 6 (six) hours as needed for moderate pain.  . cetirizine (ZYRTEC) 10 MG tablet Take 1 tablet (10 mg total) by mouth daily.  . cyanocobalamin (,VITAMIN B-12,) 1000 MCG/ML injection INJECT 1ML IM EVERY 30 DAYS  . diphenhydrAMINE (BENADRYL) 25 mg capsule Take 25 mg by mouth every 6 (six) hours as needed for allergies (bee stings).   . metoprolol tartrate (LOPRESSOR) 25 MG tablet Take 0.5 tablets (12.5 mg total) by mouth 2 (two) times daily.  Marland Kitchen omeprazole (PRILOSEC) 20 MG capsule Take 20 mg by mouth daily.  Alveda Reasons 20 MG TABS tablet TAKE 1 TABLET DAILY WITH SUPPER    Past Surgical History:  Procedure Laterality Date  . ABDOMINAL HYSTERECTOMY  1989  . BALLOON DILATION  12/11/2010   Procedure: BALLOON DILATION;  Surgeon: Rogene Houston, MD;  [facility];  Service: Endoscopy;  Laterality: N/A;  . BOWEL RESECTION  01/05/2011   Procedure: SMALL BOWEL RESECTION;  Surgeon: Jamesetta So;  Location: [facility];  Service: General;;  Partial Small Bowel Resection  .  COLONOSCOPY  02/25/2012   Procedure: COLONOSCOPY;  Surgeon: Rogene Houston, MD;  Location: [facility];  Service: Endoscopy;  Laterality: N/A;  1200  . GIVENS CAPSULE STUDY  01/02/2011   Procedure: GIVENS CAPSULE STUDY;  Surgeon: Rogene Houston, MD;  Location: [facility];  Service: Endoscopy;  Laterality: N/A;  . LAPAROTOMY  01/05/2011   Procedure: EXPLORATORY LAPAROTOMY;  Surgeon: Jamesetta So;  Location: [facility];  Service: General;  Laterality: N/A;  . OPEN REDUCTION INTERNAL FIXATION (ORIF) SCAPHOID WITH DISTAL RADIUS GRAFT Right 02/01/2016   Procedure: Right distal radius open reduction and internal fixation and repair as indicated;  Surgeon: Iran Planas, MD;   Location: [facility];  Service: Orthopedics;  Laterality: Right;  Requests 90 mins    Family History  Problem Relation Age of Onset  . Depression Mother   . Parkinson's disease Mother     New complaints: None today  Social history: Lives with her husband  Controlled substance contract: n/a    Review of Systems  Constitutional: Negative for diaphoresis.  Eyes: Negative for pain.  Respiratory: Negative for shortness of breath.   Cardiovascular: Negative for chest pain, palpitations and leg swelling.  Gastrointestinal: Negative for abdominal pain.  Endocrine: Negative for polydipsia.  Skin: Negative for rash.  Neurological: Negative for dizziness, weakness and headaches.  Hematological: Does not bruise/bleed easily.  All other systems reviewed and are negative.      Objective:   Physical Exam Vitals and nursing note reviewed.  Constitutional:      General: She is not in acute distress.    Appearance: Normal appearance. She is well-developed.  HENT:     Head: Normocephalic.     Nose: Nose normal.  Eyes:     Pupils: Pupils are equal, round, and reactive to light.  Neck:     Vascular: No carotid bruit or JVD.  Cardiovascular:     Rate and Rhythm: Normal rate and regular rhythm.     Pulses: Normal pulses.     Heart sounds: Normal heart sounds.  Pulmonary:     Effort: Pulmonary effort is normal. No respiratory distress.     Breath sounds: Normal breath sounds. No wheezing or rales.  Chest:     Chest wall: No tenderness.  Abdominal:     General: Bowel sounds are normal. There is no distension or abdominal bruit.     Palpations: Abdomen is soft. There is no hepatomegaly, splenomegaly, mass or pulsatile mass.     Tenderness: There is no abdominal tenderness.  Musculoskeletal:        General: Normal range of motion.     Cervical back: Normal range of motion and neck supple.  Lymphadenopathy:     Cervical: No cervical adenopathy.  Skin:    General: Skin is warm and dry.      Comments: greyish skin discoloration to medial side of left leg  Neurological:     Mental Status: She is alert and oriented to person, place, and time.     Deep Tendon Reflexes: Reflexes are normal and symmetric.  Psychiatric:        Behavior: Behavior normal.        Thought Content: Thought content normal.        Judgment: Judgment normal.    BP (!) 144/78   Pulse 60   Temp 98.3 F (36.8 C) (Temporal)   Resp 20   Ht '5\' 7"'  (1.702 m)   Wt 248 lb (112.5 kg)  SpO2 98%   BMI 38.84 kg/m         Assessment & Plan:  LATEKA RADY comes in today with chief complaint of Medical Management of Chronic Issues   Diagnosis and orders addressed:  1. Chronic deep vein thrombosis (DVT) of iliac vein of left lower extremity ([name]) Continue xeralto  2. SVT (supraventricular tachycardia) ([name]) Avoid caffeine - metoprolol tartrate (LOPRESSOR) 25 MG tablet; Take 0.5 tablets (12.5 mg total) by mouth 2 (two) times daily.  Dispense: 60 tablet; Refill: 0  3. 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 Chronic anticoagulation Continue xeralto  5. Chronic ulcer of left lower extremity with fat layer exposed ([place]) Keep watch of area  6. Other iron deficiency anemia - CBC with Differential/Platelet - CMP14+EGFR - Lipid panel  7. Pernicious anemia Continue B12 injections every 3 weeks  8. Recurrent major depressive disorder, in partial remission ([place]) Stress managment  9. Gastroesophageal reflux disease without esophagitis Avoid spicy foods Do not eat 2 hours prior to bedtime - omeprazole (PRILOSEC) 20 MG capsule; Take 1 capsule (20 mg total) by mouth daily.  Dispense: 90 capsule; Refill: 1   Labs pending Health Maintenance reviewed Diet and exercise encouraged  Follow up plan: 6 months   [name] Hassell Done, FNP

## 2019-05-17 ENCOUNTER — Encounter: Payer: Self-pay | Admitting: Orthopaedic Surgery

## 2019-05-17 ENCOUNTER — Ambulatory Visit (INDEPENDENT_AMBULATORY_CARE_PROVIDER_SITE_OTHER): Payer: PPO

## 2019-05-17 ENCOUNTER — Ambulatory Visit (INDEPENDENT_AMBULATORY_CARE_PROVIDER_SITE_OTHER): Payer: PPO | Admitting: Orthopaedic Surgery

## 2019-05-17 ENCOUNTER — Other Ambulatory Visit: Payer: Self-pay

## 2019-05-17 VITALS — Ht 67.5 in | Wt 245.0 lb

## 2019-05-17 DIAGNOSIS — M25562 Pain in left knee: Secondary | ICD-10-CM

## 2019-05-17 DIAGNOSIS — G8929 Other chronic pain: Secondary | ICD-10-CM

## 2019-05-17 DIAGNOSIS — M1712 Unilateral primary osteoarthritis, left knee: Secondary | ICD-10-CM

## 2019-05-17 MED ORDER — BUPIVACAINE HCL 0.5 % IJ SOLN
2.0000 mL | INTRAMUSCULAR | Status: AC | PRN
  Administered 2019-05-17: 2 mL via INTRA_ARTICULAR

## 2019-05-17 MED ORDER — METHYLPREDNISOLONE ACETATE 40 MG/ML IJ SUSP
80.0000 mg | INTRAMUSCULAR | Status: AC | PRN
  Administered 2019-05-17: 09:00:00 80 mg via INTRA_ARTICULAR

## 2019-05-17 MED ORDER — LIDOCAINE HCL 1 % IJ SOLN
2.0000 mL | INTRAMUSCULAR | Status: AC | PRN
  Administered 2019-05-17: 2 mL
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
## 2019-05-18 ENCOUNTER — Ambulatory Visit (HOSPITAL_COMMUNITY)
Admission: RE | Admit: 2019-05-18 | Discharge: 2019-05-18 | Disposition: A | Discharge status: Home / Self Care | Payer: PPO | Source: Ambulatory Visit | Attending: Hematology | Admitting: Hematology

## 2019-05-18 ENCOUNTER — Other Ambulatory Visit: Payer: Self-pay

## 2019-05-18 DIAGNOSIS — Z1231 Encounter for screening mammogram for malignant neoplasm of breast: Secondary | ICD-10-CM | POA: Diagnosis not present

## 2019-05-31 ENCOUNTER — Ambulatory Visit (INDEPENDENT_AMBULATORY_CARE_PROVIDER_SITE_OTHER): Payer: PPO

## 2019-05-31 DIAGNOSIS — Z0000 Encounter for general adult medical examination without abnormal findings: Secondary | ICD-10-CM | POA: Diagnosis not present

## 2019-05-31 NOTE — Progress Notes (Signed)
MEDICARE ANNUAL WELLNESS VISIT  05/31/2019  Telephone Visit Disclaimer This Medicare AWV was conducted by telephone due to national recommendations for restrictions regarding the COVID-19 Pandemic (e.g. social distancing).  I verified, using two identifiers, that I am speaking with [name] or their authorized healthcare agent. I discussed the limitations, risks, security, and privacy concerns of performing an evaluation and management service by telephone and the potential availability of an in-person appointment in the future. The patient expressed understanding and agreed to proceed.   Subjective:  [name] is a 69 y.o. female patient of [name], [place] who had a Medicare Annual Wellness Visit today via telephone. [name] is Retired and lives with their spouse. she has three children. she reports that she is socially active and does interact with friends/family regularly. she is minimally physically active and enjoys gardening.  Patient Care Team: Denise Pretty, FNP as PCP - General (Family Medicine) Denise Jack, MD as Consulting Physician (Oncology)  Advanced Directives 05/31/2019 01/03/2019 07/18/2018 01/14/2018 07/16/2017 04/07/2017 01/31/2017  Does Patient Have a Medical Advance Directive? No No No No No No No  Would patient like information on creating a medical advance directive? No - Patient declined No - Patient declined No - Patient declined No - Patient declined No - Patient declined No - Patient declined -  Pre-existing out of facility DNR order (yellow form or pink MOST form) - - - - - - -    Hospital Utilization Over the Past 12 Months: # of hospitalizations or ER visits: 0 # of surgeries: 0  Review of Systems    Patient reports that her overall health is unchanged compared to last year.  Negative except she is having a left knee injection in two weeks. This will be performed by Dr. Durward Macdonald. patient is trying to avoid surgery due to  her history of blood clots.  Patient Reported Readings (BP, Pulse, CBG, Weight, etc) none  Pain Assessment Pain : No/denies pain     Current Medications & Allergies (verified) Allergies as of 05/31/2019      Reactions   Bee Venom Swelling, Rash   SWELLING REACTION UNSPECIFIED  SWELLING REACTION UNSPECIFIED    Cephalexin Swelling   PATIENT WITH Rx OF ANAPHYLAXIS TO PCN's SWELLING REACTION UNSPECIFIED  PATIENT WITH Rx OF ANAPHYLAXIS TO PCN's SWELLING REACTION UNSPECIFIED    Dexlansoprazole Swelling   SWELLING REACTION UNSPECIFIED  SWELLING REACTION UNSPECIFIED    Imatinib Other (See Comments)   Cardiac dysrhythmia   Other Swelling   PECANS MOUTH SWELLS   Penicillins Anaphylaxis   Has patient had a PCN reaction causing immediate rash, facial/tongue/throat swelling, SOB or lightheadedness with hypotension: No no Has patient had a PCN reaction causing severe rash involving mucus membranes or skin necrosis: No Has patient had a PCN reaction that required hospitalization No Has patient had a PCN reaction occurring within the last 10 years: No If all of the above answers are "NO", then may proceed with Cephalosporin use.   Latex Itching   Nylon Rash   Sulfa Antibiotics Rash   Sulfonamide Derivatives Rash   Tape Itching   Paper tape is ok Paper tape is ok      Medication List       Accurate as of May 31, 2019  8:47 AM. If you have any questions, ask your nurse or doctor.        acetaminophen 500 MG tablet Commonly known as: TYLENOL Take 500-1,000 mg by mouth every 6 (  six) hours as needed for moderate pain.   cetirizine 10 MG tablet Commonly known as: ZYRTEC Take 1 tablet (10 mg total) by mouth daily.   cyanocobalamin 1000 MCG/ML injection Commonly known as: (VITAMIN B-12) INJECT 1ML IM EVERY 30 DAYS   diphenhydrAMINE 25 mg capsule Commonly known as: BENADRYL Take 25 mg by mouth every 6 (six) hours as needed for allergies (bee stings).   metoprolol tartrate 25 MG  tablet Commonly known as: LOPRESSOR Take 0.5 tablets (12.5 mg total) by mouth 2 (two) times daily.   omeprazole 20 MG capsule Commonly known as: PRILOSEC Take 1 capsule (20 mg total) by mouth daily.   Xarelto 20 MG Tabs tablet Generic drug: rivaroxaban TAKE 1 TABLET DAILY WITH SUPPER       History (reviewed): Past Medical History:  Diagnosis Date  . Allergic urticaria 01/04/2011   Rash from tape.  . Anxiety   . Cellulitis of left leg 2006  . Clotting disorder ([place])    heterozygosity from factor v leiden  . DVT (deep venous thrombosis) ([employer]) 07/10/2010   on coumadin  . Dyspnea    with exertion  . Factor V Leiden ([place])   . GERD (gastroesophageal reflux disease)   . GIST (gastrointestinal stromal tumor), malignant ([place]) 01/03/2011   S/P resection on 01/05/11.  Intolerant to [employer]. Skin Cancer Left hand  . History of blood transfusion   . Hypertension   . Pernicious anemia 07/10/2010  . Small bowel mass 01/03/2011   s/p surgery  . Ulcer 05/2009   esophageal  . Ventricular tachycardia ([place]) 03/26/11  . Vitamin B12 deficiency    vit b12 1000 mcg monthly   Past Surgical History:  Procedure Laterality Date  . ABDOMINAL HYSTERECTOMY  1989  . BALLOON DILATION  12/11/2010   Procedure: BALLOON DILATION;  Surgeon: Rogene Houston, MD;  [facility];  Service: Endoscopy;  Laterality: N/A;  . BOWEL RESECTION  01/05/2011   Procedure: SMALL BOWEL RESECTION;  Surgeon: Jamesetta So;  [facility];  Service: General;;  Partial Small Bowel Resection  . COLONOSCOPY  02/25/2012   Procedure: COLONOSCOPY;  Surgeon: Rogene Houston, MD;  [facility];  Service: Endoscopy;  Laterality: N/A;  1200  . GIVENS CAPSULE STUDY  01/02/2011   Procedure: GIVENS CAPSULE STUDY;  Surgeon: Rogene Houston, MD;  [facility];  Service: Endoscopy;  Laterality: N/A;  . LAPAROTOMY  01/05/2011   Procedure: EXPLORATORY LAPAROTOMY;  Surgeon: Jamesetta So;  [facility];   Service: General;  Laterality: N/A;  . OPEN REDUCTION INTERNAL FIXATION (ORIF) SCAPHOID WITH DISTAL RADIUS GRAFT Right 02/01/2016   Procedure: Right distal radius open reduction and internal fixation and repair as indicated;  Surgeon: Iran Planas, MD;  Location: [facility];  Service: Orthopedics;  Laterality: Right;  Requests 90 mins   Family History  Problem Relation Age of Onset  . Depression Mother   . Parkinson's disease Mother    Social History   Socioeconomic History  . Marital status: Married    Spouse name: Jeneen Rinks  . Number of children: 3  . Years of education: Not on file  . Highest education level: Not on file  Occupational History    Employer: [employer]  . Occupation: CNA    Employer: [employer]  Tobacco Use  . Smoking status: Never Smoker  . Smokeless tobacco: Never Used  Substance and Sexual Activity  . Alcohol use: No  . Drug use: No  .  Sexual activity: Not Currently  Other Topics Concern  . Not on file  Social History Narrative  . Not on file   Social Determinants of Health   Financial Resource Strain:   . Difficulty of Paying Living Expenses:   Food Insecurity:   . Worried About Charity fundraiser in the Last Year:   . Arboriculturist in the Last Year:   Transportation Needs:   . Film/video editor (Medical):   Marland Kitchen Lack of Transportation (Non-Medical):   Physical Activity:   . Days of Exercise per Week:   . Minutes of Exercise per Session:   Stress:   . Feeling of Stress :   Social Connections:   . Frequency of Communication with Friends and Family:   . Frequency of Social Gatherings with Friends and Family:   . Attends Religious Services:   . Active Member of Clubs or Organizations:   . Attends Archivist Meetings:   Marland Kitchen Marital Status:     Activities of Daily Living In your present state of health, do you have any difficulty performing the following activities: 05/31/2019  Hearing? N  Vision? N  Difficulty concentrating or making  decisions? N  Walking or climbing stairs? N  Dressing or bathing? N  Preparing Food and eating ? N  Using the Toilet? N  In the past six months, have you accidently leaked urine? N  Do you have problems with loss of bowel control? N  Managing your Medications? N  Managing your Finances? N  Housekeeping or managing your Housekeeping? N  Some recent data might be hidden    Patient Education/ Literacy How often do you need to have someone help you when you read instructions, pamphlets, or other written materials from your doctor or pharmacy?: 1 - Never What is the last grade level you completed in school?: GED  Exercise Current Exercise Habits: The patient does not participate in regular exercise at present, Exercise limited by: None identified  Diet Patient reports consuming 2 meals a day and 3 snack(s) a day Patient reports that her primary diet is: Regular Patient reports that she does have regular access to food.   Depression Screen PHQ 2/9 Scores 05/31/2019 05/11/2019 04/13/2017 03/26/2017 02/10/2017 10/04/2015 03/01/2015  PHQ - 2 Score 0 0 1 2 0 0 0  PHQ- 9 Score - - 4 10 - - -     Fall Risk Fall Risk  05/31/2019 05/11/2019 04/13/2017 03/26/2017 02/10/2017  Falls in the past year? 0 0 No No No     Objective:  [name] seemed alert and oriented and she participated appropriately during our telephone visit.  Blood Pressure Weight BMI  BP Readings from Last 3 Encounters:  05/11/19 (!) 144/78  01/03/19 (!) 158/74  07/18/18 140/76   Wt Readings from Last 3 Encounters:  05/17/19 245 lb (111.1 kg)  05/11/19 248 lb (112.5 kg)  01/03/19 247 lb (112 kg)   BMI Readings from Last 1 Encounters:  05/17/19 37.81 kg/m    *Unable to obtain current vital signs, weight, and BMI due to telephone visit type  Hearing/Vision  . Anaiyah did not seem to have difficulty with hearing/understanding during the telephone conversation . Reports that she has not had a formal eye exam by an eye care  professional within the past year . Reports that she has not had a formal hearing evaluation within the past year *Unable to fully assess hearing and vision during telephone visit type  Cognitive Function: 6CIT Screen 05/31/2019  What Year? 0 points  What month? 0 points  What time? 0 points  Count back from 20 0 points  Months in reverse 0 points  Repeat phrase 0 points  Total Score 0   (Normal:0-7, Significant for Dysfunction: >8)  Normal Cognitive Function Screening: Yes   Immunization & Health Maintenance Record  There is no immunization history on file for this patient.  Health Maintenance  Topic Date Due  . PNA vac Low Risk Adult (1 of 2 - PCV13) 08/24/2019 (Originally 04/15/2016)  . DEXA SCAN  08/30/2019 (Originally 04/15/2016)  . INFLUENZA VACCINE  08/27/2019  . TETANUS/TDAP  11/23/2019  . MAMMOGRAM  05/17/2021  . COLONOSCOPY  02/24/2022  . COVID-19 Vaccine  Completed  . Hepatitis C Screening  Completed       Assessment  This is a routine wellness examination for [name]  Health Maintenance: Due or Overdue There are no preventive care reminders to display for this patient.  [name] does not need a referral for Community Assistance: Care Management:   no Social Work:    no Prescription Assistance:  no Nutrition/Diabetes Education:  no   Plan:  Personalized Goals  Exercise 150 minutes per week  Have three meals per day  Goal BMI under 30  Personalized Health Maintenance & Screening Recommendations    Lung Cancer Screening Recommended: no (Low Dose CT Chest recommended if Age 38-80 years, 30 pack-year currently smoking OR have quit w/in past 15 years) Hepatitis C Screening recommended: no HIV Screening recommended: no  Advanced Directives: Written information was not prepared per patient's request.  Referrals & Orders No orders of the defined types were placed in this encounter.   Follow-up Plan . Follow-up with Denise Pretty, FNP as planned . Schedule 11/14/2019    I have personally reviewed and noted the following in the patient's chart:   . Medical and social history . Use of alcohol, tobacco or illicit drugs  . Current medications and supplements . Functional ability and status . Nutritional status . Physical activity . Advanced directives . List of other physicians . Hospitalizations, surgeries, and ER visits in previous 12 months . Vitals . Screenings to include cognitive, depression, and falls . Referrals and appointments  In addition, I have reviewed and discussed with [name] certain preventive protocols, quality metrics, and best practice recommendations. A written personalized care plan for preventive services as well as general preventive health recommendations is available and can be mailed to the patient at her request.      [facility]  [id number]

## 2019-05-31 NOTE — Patient Instructions (Signed)
  [place] Maintenance Summary and Written Plan of Care  Ms. Theissen ,  Thank you for allowing me to perform your Medicare Annual Wellness Visit and for your ongoing commitment to your health.   Health Maintenance & Immunization History Health Maintenance  Topic Date Due  . PNA vac Low Risk Adult (1 of 2 - PCV13) 08/24/2019 (Originally 04/15/2016)  . DEXA SCAN  08/30/2019 (Originally 04/15/2016)  . INFLUENZA VACCINE  08/27/2019  . TETANUS/TDAP  11/23/2019  . MAMMOGRAM  05/17/2021  . COLONOSCOPY  02/24/2022  . COVID-19 Vaccine  Completed  . Hepatitis C Screening  Completed    There is no immunization history on file for this patient.  These are the patient goals that we discussed:  Increase exercise to 150 minutes per weeks  Goal BMI is under 30  Eat three balanced meals per day   This is a list of Health Maintenance Items that are overdue or due now: There are no preventive care reminders to display for this patient.   Orders/Referrals Placed Today: No orders of the defined types were placed in this encounter.  (Contact our referral department at [phone] if you have not spoken with someone about your referral appointment within the next 5 days)    Follow-up Plan  Scheduled with [place], FNP 11/14/2019 at 10:15am

## 2019-06-20 DIAGNOSIS — H6121 Impacted cerumen, right ear: Secondary | ICD-10-CM | POA: Diagnosis not present

## 2019-06-20 DIAGNOSIS — H60392 Other infective otitis externa, left ear: Secondary | ICD-10-CM | POA: Diagnosis not present

## 2019-06-22 ENCOUNTER — Ambulatory Visit: Payer: PPO | Admitting: Nurse Practitioner

## 2019-06-27 ENCOUNTER — Other Ambulatory Visit (HOSPITAL_COMMUNITY): Payer: PPO

## 2019-06-28 ENCOUNTER — Other Ambulatory Visit: Payer: Self-pay

## 2019-06-28 ENCOUNTER — Ambulatory Visit: Payer: PPO | Admitting: Orthopaedic Surgery

## 2019-07-04 ENCOUNTER — Ambulatory Visit (HOSPITAL_COMMUNITY): Payer: PPO | Admitting: Hematology

## 2019-07-11 ENCOUNTER — Other Ambulatory Visit: Payer: Self-pay

## 2019-07-11 ENCOUNTER — Inpatient Hospital Stay (HOSPITAL_COMMUNITY): Payer: PPO | Attending: Hematology

## 2019-07-11 DIAGNOSIS — Z79899 Other long term (current) drug therapy: Secondary | ICD-10-CM | POA: Diagnosis not present

## 2019-07-11 DIAGNOSIS — Z90710 Acquired absence of both cervix and uterus: Secondary | ICD-10-CM | POA: Diagnosis not present

## 2019-07-11 DIAGNOSIS — Z7901 Long term (current) use of anticoagulants: Secondary | ICD-10-CM | POA: Insufficient documentation

## 2019-07-11 DIAGNOSIS — I10 Essential (primary) hypertension: Secondary | ICD-10-CM | POA: Insufficient documentation

## 2019-07-11 DIAGNOSIS — K219 Gastro-esophageal reflux disease without esophagitis: Secondary | ICD-10-CM | POA: Diagnosis not present

## 2019-07-11 DIAGNOSIS — C49A3 Gastrointestinal stromal tumor of small intestine: Secondary | ICD-10-CM | POA: Insufficient documentation

## 2019-07-11 DIAGNOSIS — D6851 Activated protein C resistance: Secondary | ICD-10-CM | POA: Diagnosis not present

## 2019-07-11 DIAGNOSIS — Z86718 Personal history of other venous thrombosis and embolism: Secondary | ICD-10-CM | POA: Diagnosis not present

## 2019-07-11 DIAGNOSIS — D510 Vitamin B12 deficiency anemia due to intrinsic factor deficiency: Secondary | ICD-10-CM | POA: Insufficient documentation

## 2019-07-11 LAB — CBC WITH DIFFERENTIAL/PLATELET
Abs Immature Granulocytes: 0.03 10*3/uL (ref 0.00–0.07)
Basophils Absolute: 0 10*3/uL (ref 0.0–0.1)
Basophils Relative: 1 %
Eosinophils Absolute: 0.2 10*3/uL (ref 0.0–0.5)
Eosinophils Relative: 3 %
HCT: 44.5 % (ref 36.0–46.0)
Hemoglobin: 13.7 g/dL (ref 12.0–15.0)
Immature Granulocytes: 1 %
Lymphocytes Relative: 44 %
Lymphs Abs: 2.5 10*3/uL (ref 0.7–4.0)
MCH: 28.7 pg (ref 26.0–34.0)
MCHC: 30.8 g/dL (ref 30.0–36.0)
MCV: 93.1 fL (ref 80.0–100.0)
Monocytes Absolute: 0.5 10*3/uL (ref 0.1–1.0)
Monocytes Relative: 9 %
Neutro Abs: 2.3 10*3/uL (ref 1.7–7.7)
Neutrophils Relative %: 42 %
Platelets: 305 10*3/uL (ref 150–400)
RBC: 4.78 MIL/uL (ref 3.87–5.11)
RDW: 13.5 % (ref 11.5–15.5)
WBC: 5.5 10*3/uL (ref 4.0–10.5)
nRBC: 0 % (ref 0.0–0.2)

## 2019-07-11 LAB — FERRITIN: Ferritin: 44 ng/mL (ref 11–307)

## 2019-07-11 LAB — IRON AND TIBC
Iron: 110 ug/dL (ref 28–170)
Saturation Ratios: 34 % — ABNORMAL HIGH (ref 10.4–31.8)
TIBC: 326 ug/dL (ref 250–450)
UIBC: 216 ug/dL

## 2019-07-11 LAB — VITAMIN B12: Vitamin B-12: 500 pg/mL (ref 180–914)

## 2019-07-11 LAB — COMPREHENSIVE METABOLIC PANEL
ALT: 26 U/L (ref 0–44)
AST: 21 U/L (ref 15–41)
Albumin: 3.8 g/dL (ref 3.5–5.0)
Alkaline Phosphatase: 76 U/L (ref 38–126)
Anion gap: 9 (ref 5–15)
BUN: 13 mg/dL (ref 8–23)
CO2: 26 mmol/L (ref 22–32)
Calcium: 9 mg/dL (ref 8.9–10.3)
Chloride: 106 mmol/L (ref 98–111)
Creatinine, Ser: 0.65 mg/dL (ref 0.44–1.00)
GFR calc Af Amer: >60 mL/min (ref >60)
GFR calc non Af Amer: >60 mL/min (ref >60)
Glucose, Bld: 100 mg/dL — ABNORMAL HIGH (ref 70–99)
Potassium: 4.3 mmol/L (ref 3.5–5.1)
Sodium: 141 mmol/L (ref 135–145)
Total Bilirubin: 0.6 mg/dL (ref 0.3–1.2)
Total Protein: 7.5 g/dL (ref 6.5–8.1)

## 2019-07-11 LAB — LACTATE DEHYDROGENASE: LDH: 165 U/L (ref 98–192)

## 2019-07-18 ENCOUNTER — Inpatient Hospital Stay (HOSPITAL_BASED_OUTPATIENT_CLINIC_OR_DEPARTMENT_OTHER): Payer: PPO | Admitting: Oncology

## 2019-07-18 ENCOUNTER — Other Ambulatory Visit: Payer: Self-pay

## 2019-07-18 VITALS — BP 151/84 | HR 62 | Temp 97.7°F | Resp 18

## 2019-07-18 DIAGNOSIS — Z7901 Long term (current) use of anticoagulants: Secondary | ICD-10-CM | POA: Diagnosis not present

## 2019-07-18 DIAGNOSIS — D6851 Activated protein C resistance: Secondary | ICD-10-CM

## 2019-07-18 DIAGNOSIS — I82522 Chronic embolism and thrombosis of left iliac vein: Secondary | ICD-10-CM

## 2019-07-18 DIAGNOSIS — D510 Vitamin B12 deficiency anemia due to intrinsic factor deficiency: Secondary | ICD-10-CM | POA: Diagnosis not present

## 2019-07-18 DIAGNOSIS — C49A3 Gastrointestinal stromal tumor of small intestine: Secondary | ICD-10-CM

## 2019-07-18 NOTE — Patient Instructions (Signed)
[place] at [facility] Discharge Instructions  You were seen today by Kirby Crigler PA. He went over your recent lab results. He will see you back in 6 months for labs and follow up.   Thank you for choosing [place] at [facility] to provide your oncology and hematology care.  To afford each patient quality time with our provider, please arrive at least 15 minutes before your scheduled appointment time.   If you have a lab appointment with the [facility] please come in thru the  Main Entrance and check in at the main information desk  You need to re-schedule your appointment should you arrive 10 or more minutes late.  We strive to give you quality time with our providers, and arriving late affects you and other patients whose appointments are after yours.  Also, if you no show three or more times for appointments you may be dismissed from the clinic at the providers discretion.     Again, thank you for choosing [facility]  Our hope is that these requests will decrease the amount of time that you wait before being seen by our physicians.       _____________________________________________________________  Should you have questions after your visit to [facility], please contact our office at [phone] between the hours of 8:00 a.m. and 4:30 p.m.  Voicemails left after 4:00 p.m. will not be returned until the following business day.  For prescription refill requests, have your pharmacy contact our office and allow 72 hours.    Cancer Center Support Programs:   > Cancer Support Group  2nd Tuesday of the month 1pm-2pm, Journey Room

## 2019-07-18 NOTE — Progress Notes (Signed)
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  Heterozygous factor V Leiden by itself is not a strong thrombophilia and does not require lifelong anticoagulation, BUT given her history of RECURRENT DVT, she will be maintained on lifelong anticoagulation. AND GIST, Stage II, S/P resection by Dr. Arnoldo Morale on 01/05/2011.  NED AND B12 deficiency, on IM B12 replacement therapy.  CURRENT STATUS: [name] 69 y.o. female returns for followup of FVL heterozygosity, history of GIST, and B12 deficiency.  She is tolerating Xarelto well without abnormal bleeding/bruising.  She expectantly reports easy bruising secondary to blood thinner therapy.  No blood in stool, black stool.  No gross hematuria.  No hemoptysis.  She admits to compliance with Xarelto therapy.  No impending surgical procedures planned.  She is up to date on mammograms.   She is only undergoing imaging for GIST as clinically indicated to prevent overexposure to radiation.  She is taking B12 injections at home every 4 weeks.  She reports in the past feeling significantly improved after taking B12 injections but more recently is not noticing much difference after  injecting B12.  In the past she increase the frequency to every 3 weeks but that did not improve the way she was feeling.  She reports feeling great up until about 10:00 in the morning and then she feels tired and fatigued.  She is switching primary care doctors and she sees her new primary care doctor in just a few weeks.  I have encouraged to to inquire about thyroid testing to evaluate for hypothyroidism.  Review of Systems  Constitutional: Positive for malaise/fatigue. Negative for chills, fever and weight loss.  HENT: Negative.   Eyes: Negative.   Respiratory: Negative.  Negative for cough.   Cardiovascular: Negative.  Negative for chest pain.  Gastrointestinal: Negative.  Negative for blood in stool, constipation, diarrhea, melena, nausea and vomiting.  Genitourinary: Negative.   Musculoskeletal: Negative.   Skin: Negative.   Neurological: Negative.  Negative for weakness.  Endo/Heme/Allergies: Negative.   Psychiatric/Behavioral: Negative.     Past Medical History:  Diagnosis Date  . Allergic urticaria 01/04/2011   Rash from tape.  . Anxiety   . Cellulitis of left leg 2006  . Clotting disorder ([place])    heterozygosity from factor v leiden  . DVT (deep venous thrombosis) ([place]) 07/10/2010   on coumadin  . Dyspnea    with exertion  . Factor V Leiden ([place])   . GERD (gastroesophageal reflux disease)   . GIST (gastrointestinal stromal tumor), malignant ([place]) 01/03/2011   S/P resection on 01/05/11.  Intolerant to [employer]. Skin Cancer Left hand  . History of blood transfusion   . Hypertension   . Pernicious anemia 07/10/2010  . Small bowel mass 01/03/2011   s/p surgery  . Ulcer 05/2009   esophageal  . Ventricular tachycardia ([place]) 03/26/11  .  Vitamin B12 deficiency    vit b12 1000 mcg monthly    Past Surgical History:  Procedure Laterality Date  . ABDOMINAL HYSTERECTOMY  1989  . BALLOON DILATION  12/11/2010   Procedure: BALLOON DILATION;  Surgeon: Rogene Houston, MD;   [facility];  Service: Endoscopy;  Laterality: N/A;  . BOWEL RESECTION  01/05/2011   Procedure: SMALL BOWEL RESECTION;  Surgeon: Jamesetta So;  Location: [facility];  Service: General;;  Partial Small Bowel Resection  . COLONOSCOPY  02/25/2012   Procedure: COLONOSCOPY;  Surgeon: Rogene Houston, MD;  [facility];  Service: Endoscopy;  Laterality: N/A;  1200  . GIVENS CAPSULE STUDY  01/02/2011   Procedure: GIVENS CAPSULE STUDY;  Surgeon: Rogene Houston, MD;  [facility];  Service: Endoscopy;  Laterality: N/A;  . LAPAROTOMY  01/05/2011   Procedure: EXPLORATORY LAPAROTOMY;  Surgeon: Jamesetta So;  Location: [facility];  Service: General;  Laterality: N/A;  . OPEN REDUCTION INTERNAL FIXATION (ORIF) SCAPHOID WITH DISTAL RADIUS GRAFT Right 02/01/2016   Procedure: Right distal radius open reduction and internal fixation and repair as indicated;  Surgeon: Iran Planas, MD;  Location: [place];  Service: Orthopedics;  Laterality: Right;  Requests 90 mins    Family History  Problem Relation Age of Onset  . Depression Mother   . Parkinson's disease Mother     Social History   Socioeconomic History  . Marital status: Married    Spouse name: Jeneen Rinks  . Number of children: 3  . Years of education: Not on file  . Highest education level: Not on file  Occupational History    Employer: [employer]  . Occupation: CNA    Employer: [employer]  Tobacco Use  . Smoking status: Never Smoker  . Smokeless tobacco: Never Used  Vaping Use  . Vaping Use: Never used  Substance and Sexual Activity  . Alcohol use: No  . Drug use: No  . Sexual activity: Not Currently  Other Topics Concern  . Not on file  Social History Narrative  . Not on file   Social Determinants of Health   Financial Resource Strain:   . Difficulty of Paying Living Expenses:   Food Insecurity:   . Worried About Charity fundraiser in the Last Year:   . Arboriculturist in the Last Year:   Transportation  Needs:   . Film/video editor (Medical):   Marland Kitchen Lack of Transportation (Non-Medical):   Physical Activity:   . Days of Exercise per Week:   . Minutes of Exercise per Session:   Stress:   . Feeling of Stress :   Social Connections:   . Frequency of Communication with Friends and Family:   . Frequency of Social Gatherings with Friends and Family:   . Attends Religious Services:   . Active Member of Clubs or Organizations:   . Attends Archivist Meetings:   Marland Kitchen Marital Status:      PHYSICAL EXAMINATION  ECOG PERFORMANCE STATUS: 0 - Asymptomatic  Vitals:   07/18/19 0841  BP: (!) 151/84  Pulse: 62  Resp: 18  Temp: 97.7 F (36.5 C)  SpO2: 97%    GENERAL:alert, healthy, no distress, well nourished, well developed, comfortable, cooperative, obese and smiling SKIN: skin color, texture, turgor are normal, no rashes or significant lesions HEAD: Normocephalic, No masses, lesions, tenderness or abnormalities EYES: normal EARS: External ears normal OROPHARYNX:not examined- mask in place  NECK: supple,  no adenopathy, thyroid normal size, non-tender, without nodularity LYMPH:  no palpable lymphadenopathy, no hepatosplenomegaly BREAST:not examined LUNGS: clear to auscultation and percussion HEART: regular rate & rhythm, no murmurs and no gallops ABDOMEN:abdomen soft, non-tender, obese, normal bowel sounds and no masses or organomegaly BACK: Back symmetric, no curvature. EXTREMITIES:less then 2 second capillary refill, no joint deformities, effusion, or inflammation, no edema, no cyanosis  NEURO: alert & oriented x 3 with fluent speech, no focal motor/sensory deficits, gait normal    LABORATORY DATA: CBC    Component Value Date/Time   WBC 5.5 07/11/2019 0941   RBC 4.78 07/11/2019 0941   HGB 13.7 07/11/2019 0941   HCT 44.5 07/11/2019 0941   PLT 305 07/11/2019 0941   MCV 93.1 07/11/2019 0941   MCH 28.7 07/11/2019 0941   MCHC 30.8 07/11/2019 0941   RDW 13.5 07/11/2019  0941   LYMPHSABS 2.5 07/11/2019 0941   MONOABS 0.5 07/11/2019 0941   EOSABS 0.2 07/11/2019 0941   BASOSABS 0.0 07/11/2019 0941      Chemistry      Component Value Date/Time   NA 141 07/11/2019 0941   K 4.3 07/11/2019 0941   CL 106 07/11/2019 0941   CO2 26 07/11/2019 0941   BUN 13 07/11/2019 0941   CREATININE 0.65 07/11/2019 0941      Component Value Date/Time   CALCIUM 9.0 07/11/2019 0941   ALKPHOS 76 07/11/2019 0941   AST 21 07/11/2019 0941   ALT 26 07/11/2019 0941   BILITOT 0.6 07/11/2019 0941       RADIOGRAPHIC STUDIES:  No results found.   PATHOLOGY:    ASSESSMENT AND PLAN:  1. Factor V Leiden ([place]) Heterozygous factor V leiden mutation with RECURRENT DVT, resulting in lifelong anticoagulation.  Heterozygous factor V Leiden by itself is not a strong thrombophilia and does not require lifelong anticoagulation, BUT given her history of RECURRENT DVT, she will be maintained on lifelong anticoagulation.  Labs from 07/11/2019 are reviewed.  Labs results are unremarkable.  Will repeat labs in 6 months: CBC diff, CMET, iron/TIBC, ferritin, B12, MMA.  Continue Xarelto anticoagulation therapy lifelong.  Return in 6 months for follow-up.  2. 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 Arnoldo Morale on 01/05/2011.  NED  Due to risk of overexposure to repeated CT imaging, will only pursue CT scans when clinically indicated.  She is nearly 10 years out from diagnosis.  3. Pernicious anemia On IM B12 replacement therapy.  4. Chronic deep vein thrombosis (DVT) of iliac vein of left lower extremity ([name]) Increased risk of VTE due to FVL- on Xarelto anticoagulation with Xarelto  5. Chronic anticoagulation On Xarelto anticoagulation without abnormal bleeding or bruising.   ORDERS PLACED FOR THIS ENCOUNTER: Orders Placed This Encounter  Procedures  . CBC with Differential  . Comprehensive metabolic panel  .  Iron and TIBC  . Ferritin  . Vitamin B12  . Folate  . Lactate dehydrogenase    MEDICATIONS PRESCRIBED THIS ENCOUNTER: No orders of the defined types were placed in this encounter.   All questions were answered. The patient knows to call the clinic with any problems, questions or concerns. We can certainly see the patient much sooner if necessary.  Patient and plan discussed with Dr. Derek Jack and she is in agreement with the aforementioned.   This note is electronically signed by: Robynn Pane, PA-C 07/18/2019 9:46 AM

## 2019-08-03 DIAGNOSIS — D510 Vitamin B12 deficiency anemia due to intrinsic factor deficiency: Secondary | ICD-10-CM | POA: Diagnosis not present

## 2019-08-03 DIAGNOSIS — I82509 Chronic embolism and thrombosis of unspecified deep veins of unspecified lower extremity: Secondary | ICD-10-CM | POA: Diagnosis not present

## 2019-08-03 DIAGNOSIS — C49A3 Gastrointestinal stromal tumor of small intestine: Secondary | ICD-10-CM | POA: Diagnosis not present

## 2019-08-03 DIAGNOSIS — M1710 Unilateral primary osteoarthritis, unspecified knee: Secondary | ICD-10-CM | POA: Diagnosis not present

## 2019-08-03 DIAGNOSIS — Z7901 Long term (current) use of anticoagulants: Secondary | ICD-10-CM | POA: Diagnosis not present

## 2019-08-03 DIAGNOSIS — Z6838 Body mass index (BMI) 38.0-38.9, adult: Secondary | ICD-10-CM | POA: Diagnosis not present

## 2019-08-03 DIAGNOSIS — D682 Hereditary deficiency of other clotting factors: Secondary | ICD-10-CM | POA: Diagnosis not present

## 2019-08-03 DIAGNOSIS — R0600 Dyspnea, unspecified: Secondary | ICD-10-CM | POA: Diagnosis not present

## 2019-08-03 DIAGNOSIS — I499 Cardiac arrhythmia, unspecified: Secondary | ICD-10-CM | POA: Diagnosis not present

## 2019-08-31 DIAGNOSIS — I5189 Other ill-defined heart diseases: Secondary | ICD-10-CM | POA: Diagnosis not present

## 2019-08-31 DIAGNOSIS — I059 Rheumatic mitral valve disease, unspecified: Secondary | ICD-10-CM | POA: Diagnosis not present

## 2019-08-31 DIAGNOSIS — R0602 Shortness of breath: Secondary | ICD-10-CM | POA: Diagnosis not present

## 2019-08-31 DIAGNOSIS — I517 Cardiomegaly: Secondary | ICD-10-CM | POA: Diagnosis not present

## 2019-09-20 ENCOUNTER — Other Ambulatory Visit: Payer: Self-pay | Admitting: Nurse Practitioner

## 2019-09-20 DIAGNOSIS — I471 Supraventricular tachycardia: Secondary | ICD-10-CM

## 2019-09-29 DIAGNOSIS — Z91030 Bee allergy status: Secondary | ICD-10-CM | POA: Diagnosis not present

## 2019-09-29 DIAGNOSIS — Z86718 Personal history of other venous thrombosis and embolism: Secondary | ICD-10-CM | POA: Diagnosis not present

## 2019-09-29 DIAGNOSIS — W57XXXA Bitten or stung by nonvenomous insect and other nonvenomous arthropods, initial encounter: Secondary | ICD-10-CM | POA: Diagnosis not present

## 2019-09-29 DIAGNOSIS — S1086XA Insect bite of other specified part of neck, initial encounter: Secondary | ICD-10-CM | POA: Diagnosis not present

## 2019-09-29 DIAGNOSIS — Z880 Allergy status to penicillin: Secondary | ICD-10-CM | POA: Diagnosis not present

## 2019-09-29 DIAGNOSIS — Z882 Allergy status to sulfonamides status: Secondary | ICD-10-CM | POA: Diagnosis not present

## 2019-09-29 DIAGNOSIS — S1096XA Insect bite of unspecified part of neck, initial encounter: Secondary | ICD-10-CM | POA: Diagnosis not present

## 2019-09-29 DIAGNOSIS — D6851 Activated protein C resistance: Secondary | ICD-10-CM | POA: Diagnosis not present

## 2019-11-08 ENCOUNTER — Other Ambulatory Visit: Payer: Self-pay

## 2019-11-08 ENCOUNTER — Encounter: Payer: Self-pay | Admitting: Allergy & Immunology

## 2019-11-08 ENCOUNTER — Ambulatory Visit (INDEPENDENT_AMBULATORY_CARE_PROVIDER_SITE_OTHER): Payer: PPO | Admitting: Allergy & Immunology

## 2019-11-08 VITALS — BP 130/90 | HR 76 | Temp 98.3°F | Resp 16 | Ht 66.4 in | Wt 245.2 lb

## 2019-11-08 DIAGNOSIS — T63481D Toxic effect of venom of other arthropod, accidental (unintentional), subsequent encounter: Secondary | ICD-10-CM | POA: Diagnosis not present

## 2019-11-08 DIAGNOSIS — R899 Unspecified abnormal finding in specimens from other organs, systems and tissues: Secondary | ICD-10-CM

## 2019-11-08 DIAGNOSIS — J302 Other seasonal allergic rhinitis: Secondary | ICD-10-CM

## 2019-11-08 NOTE — Progress Notes (Signed)
NEW PATIENT  Date of Service/Encounter:  11/08/19  Referring provider: Chevis Pretty, FNP   Assessment:   Insect sting allergy - obtaining panel and serum tryptase today  Seasonal allergic rhinitis - controlled with OTC antihistamines  Contact dermatitis (adhesives, tape)  Fully vaccinated to COVID19 [place])  Plan/Recommendations:   1. Insect sting allergy - We are going to get a stinging insect panel today. - We are also going to to get a tryptase today to make sure that this is not elevated.  - EpiPen is up to date. - Anaphylaxis management plan provided.   2. Seasonal allergic rhinitis  - We are going to get some environmental allergy testing via the blood. - Continue with an OTC antihistamine as needed.            3. Return in about 6 months (around 05/08/2020).    Subjective:   [name] is a 69 y.o. female presenting today for evaluation of  Chief Complaint  Patient presents with  . Insect Bite    stong by 3 bees went to ED had swelling at spots specially the one behind the ear    [name] has a history of the following: Patient Active Problem List   Diagnosis Date Noted  . Unilateral primary osteoarthritis, left knee 05/26/2017  . Depression 11/19/2014  . Hypotension, iatrogenic 03/26/2011  . SVT (supraventricular tachycardia) ([place]) 03/25/2011  . GIST (gastrointestinal stromal tumor), malignant ([place]) 01/03/2011  . Chronic ulcer of left leg ([place]) 01/01/2011  . Chronic anticoagulation 01/01/2011  . Factor V Leiden ([place])   . DVT (deep venous thrombosis) ([place]) 07/10/2010  . Pernicious anemia 07/10/2010  . Iron deficiency anemia 08/27/2009    History obtained from: chart review and patient.  [name] was referred by Chevis Pretty, FNP.     [name] is a 69 y.o. female presenting for an evaluation of stinging insect allergies as well as a concern for an elevated tryptase.  Stinging Insect Symptom History: She has  a history of bee stings. She was stung one month ago and was stung behind her right ear. She developed swelling and used her EpiPen. She was initially diagnosed decades ago. She did have testing done in [place]. She was allergic more to the yellow jackets than anything else. She is allergic to honeybees and wasps. She was never on venom shots. She is also concerned about having an elevated tryptase because she has a daughter who is seen in our clinic who has mildly elevated tryptase. She has had a thorough workup and been negative.   Allergic Rhinitis Symptom History: She does spend a lot of time outdoors in the spring and the fall. She will take an OTC allergy pill and this controls it without a problem.   Food Allergy Symptom History: She does report some tingling of her lower lip with pecans. She does not like walnuts. She tolerates peanuts, cashews, and almonds. Pecans are the only ones she has reacted to. The pecans are only intermittent. She does not eat a lot of pecans at once but she does not stay away from them.  Contact Dermatitis Symptom History: She is allergic to tapes and adhesives. She is not interested in patch testing.    She has a history of factor V Leiden. She has been on Xarelto for about 3 to 4 years.  Prior to that, she was on Coumadin for 20+ years.  She has not had any clots since her son was 2  years old around 35 years ago. Of note, her son died in a car accident around 12 years ago.    Otherwise, there is no history of other atopic diseases, including asthma, food allergies, drug allergies, eczema, urticaria or contact dermatitis. There is no significant infectious history. Vaccinations are up to date.    Past Medical History: Patient Active Problem List   Diagnosis Date Noted  . Unilateral primary osteoarthritis, left knee 05/26/2017  . Depression 11/19/2014  . Hypotension, iatrogenic 03/26/2011  . SVT (supraventricular tachycardia) ([place]) 03/25/2011  . GIST  (gastrointestinal stromal tumor), malignant ([place]) 01/03/2011  . Chronic ulcer of left leg ([place]) 01/01/2011  . Chronic anticoagulation 01/01/2011  . Factor V Leiden ([place])   . DVT (deep venous thrombosis) ([place]) 07/10/2010  . Pernicious anemia 07/10/2010  . Iron deficiency anemia 08/27/2009    Medication List:  Allergies as of 11/08/2019      Reactions   Bee Venom Swelling, Rash   SWELLING REACTION UNSPECIFIED  SWELLING REACTION UNSPECIFIED    Cephalexin Swelling   PATIENT WITH Rx OF ANAPHYLAXIS TO PCN's SWELLING REACTION UNSPECIFIED  PATIENT WITH Rx OF ANAPHYLAXIS TO PCN's SWELLING REACTION UNSPECIFIED    Dexlansoprazole Swelling   SWELLING REACTION UNSPECIFIED  SWELLING REACTION UNSPECIFIED    Imatinib Other (See Comments)   Cardiac dysrhythmia   Other Swelling   PECANS MOUTH SWELLS   Penicillins Anaphylaxis   Has patient had a PCN reaction causing immediate rash, facial/tongue/throat swelling, SOB or lightheadedness with hypotension: No no Has patient had a PCN reaction causing severe rash involving mucus membranes or skin necrosis: No Has patient had a PCN reaction that required hospitalization No Has patient had a PCN reaction occurring within the last 10 years: No If all of the above answers are "NO", then may proceed with Cephalosporin use.   Latex Itching   Nylon Rash   Sulfa Antibiotics Rash   Sulfonamide Derivatives Rash   Tape Itching   Paper tape is ok Paper tape is ok      Medication List       Accurate as of November 08, 2019 12:28 PM. If you have any questions, ask your nurse or doctor.        STOP taking these medications   neomycin-polymyxin-hydrocortisone OTIC solution Commonly known as: CORTISPORIN Stopped by: Valentina Shaggy, MD     TAKE these medications   acetaminophen 500 MG tablet Commonly known as: TYLENOL Take 500-1,000 mg by mouth every 6 (six) hours as needed for moderate pain.   cyanocobalamin 1000 MCG/ML injection Commonly  known as: (VITAMIN B-12) INJECT 1ML IM EVERY 30 DAYS   diphenhydrAMINE 25 mg capsule Commonly known as: BENADRYL Take 25 mg by mouth every 6 (six) hours as needed for allergies (bee stings).   metoprolol tartrate 25 MG tablet Commonly known as: LOPRESSOR TAKE 1/2 TABLET TWICE DAILY   omeprazole 20 MG capsule Commonly known as: PRILOSEC Take 1 capsule (20 mg total) by mouth daily.   Probiotic-10 Ultimate Caps Take by mouth.   Xarelto 20 MG Tabs tablet Generic drug: rivaroxaban TAKE 1 TABLET DAILY WITH SUPPER       Birth History: non-contributory  Developmental History: non-contributory  Past Surgical History: Past Surgical History:  Procedure Laterality Date  . ABDOMINAL HYSTERECTOMY  1989  . BALLOON DILATION  12/11/2010   Procedure: BALLOON DILATION;  Surgeon: Rogene Houston, MD;  [facility];  Service: Endoscopy;  Laterality: N/A;  . BOWEL RESECTION  01/05/2011  Procedure: SMALL BOWEL RESECTION;  Surgeon: Jamesetta So;  Location[facility];  Service: General;;  Partial Small Bowel Resection  . COLONOSCOPY  02/25/2012   Procedure: COLONOSCOPY;  Surgeon: Rogene Houston, MD;  [facility];  Service: Endoscopy;  Laterality: N/A;  1200  . GIVENS CAPSULE STUDY  01/02/2011   Procedure: GIVENS CAPSULE STUDY;  Surgeon: Rogene Houston, MD;  [facility];  Service: Endoscopy;  Laterality: N/A;  . LAPAROTOMY  01/05/2011   Procedure: EXPLORATORY LAPAROTOMY;  Surgeon: Jamesetta So;  Location[facility];  Service: General;  Laterality: N/A;  . OPEN REDUCTION INTERNAL FIXATION (ORIF) SCAPHOID WITH DISTAL RADIUS GRAFT Right 02/01/2016   Procedure: Right distal radius open reduction and internal fixation and repair as indicated;  Surgeon: Iran Planas, MD;  Location: [place];  Service: Orthopedics;  Laterality: Right;  Requests 90 mins     Family History: Family History  Problem Relation Age of Onset  . Depression Mother   . Parkinson's disease Mother    . Allergic rhinitis Brother   . Angioedema Neg Hx   . Asthma Neg Hx   . Eczema Neg Hx   . Immunodeficiency Neg Hx   . Urticaria Neg Hx      Social History: Bradleigh lives at home with her family.  He has been a house that is 68 years old.  There is laminate flooring in the bedrooms.  They have a heat pump for heating and cooling.  There are dogs outside of the home.  There are dust mite covers on the bed, but not the pillows.  There is no tobacco exposure.  She is currently retired.   Review of Systems  Constitutional: Negative.  Negative for chills, fever, malaise/fatigue and weight loss.  HENT: Negative.  Negative for congestion, ear discharge, ear pain and sore throat.   Eyes: Negative for pain, discharge and redness.  Respiratory: Negative for cough, sputum production, shortness of breath and wheezing.   Cardiovascular: Negative.  Negative for chest pain and palpitations.  Gastrointestinal: Negative for abdominal pain, constipation, diarrhea, heartburn, nausea and vomiting.  Skin: Negative.  Negative for itching and rash.  Neurological: Negative for dizziness and headaches.  Endo/Heme/Allergies: Negative for environmental allergies. Does not bruise/bleed easily.       Objective:   Blood pressure 130/90, pulse 76, temperature 98.3 F (36.8 C), temperature source Temporal, resp. rate 16, height 5' 6.4" (1.687 m), weight 245 lb 3.2 oz (111.2 kg), SpO2 96 %. Body mass index is 39.1 kg/m.   Physical Exam:   Physical Exam Constitutional:      Appearance: Normal appearance. She is well-developed.  HENT:     Head: Normocephalic and atraumatic.     Right Ear: Tympanic membrane, ear canal and external ear normal. No drainage, swelling or tenderness. Tympanic membrane is not injected, scarred, erythematous, retracted or bulging.     Left Ear: Tympanic membrane, ear canal and external ear normal. No drainage, swelling or tenderness. Tympanic membrane is not injected, scarred,  erythematous, retracted or bulging.     Nose: No nasal deformity, septal deviation, mucosal edema or rhinorrhea.     Right Turbinates: Enlarged and swollen.     Left Turbinates: Enlarged and swollen.     Right Sinus: No maxillary sinus tenderness or frontal sinus tenderness.     Left Sinus: No maxillary sinus tenderness or frontal sinus tenderness.     Mouth/Throat:     Mouth: Mucous membranes are not pale and  not dry.     Pharynx: Uvula midline.  Eyes:     General:        Right eye: No discharge.        Left eye: No discharge.     Conjunctiva/sclera: Conjunctivae normal.     Right eye: Right conjunctiva is not injected. No chemosis.    Left eye: Left conjunctiva is not injected. No chemosis.    Pupils: Pupils are equal, round, and reactive to light.  Cardiovascular:     Rate and Rhythm: Normal rate and regular rhythm.     Heart sounds: Normal heart sounds.  Pulmonary:     Effort: Pulmonary effort is normal. No tachypnea, accessory muscle usage or respiratory distress.     Breath sounds: Normal breath sounds. No wheezing, rhonchi or rales.     Comments: Moving air well in all lung fields. No increased work of breathing.  Chest:     Chest wall: No tenderness.  Abdominal:     Tenderness: There is no abdominal tenderness. There is no guarding or rebound.  Lymphadenopathy:     Head:     Right side of head: No submandibular, tonsillar or occipital adenopathy.     Left side of head: No submandibular, tonsillar or occipital adenopathy.     Cervical: No cervical adenopathy.  Skin:    Coloration: Skin is not pale.     Findings: No abrasion, erythema, petechiae or rash. Rash is not papular, urticarial or vesicular.     Comments: No eczematous   Neurological:     Mental Status: She is alert.  Psychiatric:        Behavior: Behavior is cooperative.      Diagnostic studies: labs sent instead      Salvatore Marvel, MD [facility]

## 2019-11-08 NOTE — Patient Instructions (Addendum)
1. Insect sting allergy - We are going to get a stinging insect panel today. - We are also going to to get a tryptase today to make sure that this is not elevated.  - EpiPen is up to date. - Anaphylaxis management plan provided.   2. Seasonal allergic rhinitis  - We are going to get some environmental allergy testing via the blood. - Continue with an OTC antihistamine as needed.            3. Return in about 6 months (around 05/08/2020).    Please inform us of any Emergency Department visits, hospitalizations, or changes in symptoms. Call us before going to the ED for breathing or allergy symptoms since we might be able to fit you in for a sick visit. Feel free to contact us anytime with any questions, problems, or concerns.  It was a pleasure to meet you today! We LOVE [name]!   Websites that have reliable patient information: 1. American Academy of Asthma, Allergy, and Immunology: www.aaaai.org 2. Food Allergy Research and Education (FARE): foodallergy.org 3. Mothers of Asthmatics: http://www.asthmacommunitynetwork.org 4. American College of Allergy, Asthma, and Immunology: www.acaai.org   COVID-19 Vaccine Information can be found at: ShippingScam.co.uk For questions related to vaccine distribution or appointments, please email vaccine@[email or web address] .[email or web address] or call [phone].     "Like" [place] on Facebook and Instagram for our latest updates!     HAPPY FALL!     Make sure you are registered to vote! If you have moved or changed any of your contact information, you will need to get this updated before voting!  In some cases, you MAY be able to register to vote online: [email or web address]

## 2019-11-10 DIAGNOSIS — Z1331 Encounter for screening for depression: Secondary | ICD-10-CM | POA: Diagnosis not present

## 2019-11-10 DIAGNOSIS — D682 Hereditary deficiency of other clotting factors: Secondary | ICD-10-CM | POA: Diagnosis not present

## 2019-11-10 DIAGNOSIS — Z6838 Body mass index (BMI) 38.0-38.9, adult: Secondary | ICD-10-CM | POA: Diagnosis not present

## 2019-11-10 DIAGNOSIS — Z1389 Encounter for screening for other disorder: Secondary | ICD-10-CM | POA: Diagnosis not present

## 2019-11-10 DIAGNOSIS — I499 Cardiac arrhythmia, unspecified: Secondary | ICD-10-CM | POA: Diagnosis not present

## 2019-11-10 DIAGNOSIS — Z7901 Long term (current) use of anticoagulants: Secondary | ICD-10-CM | POA: Diagnosis not present

## 2019-11-10 DIAGNOSIS — I82509 Chronic embolism and thrombosis of unspecified deep veins of unspecified lower extremity: Secondary | ICD-10-CM | POA: Diagnosis not present

## 2019-11-10 DIAGNOSIS — Z23 Encounter for immunization: Secondary | ICD-10-CM | POA: Diagnosis not present

## 2019-11-14 ENCOUNTER — Other Ambulatory Visit: Payer: Self-pay

## 2019-11-14 ENCOUNTER — Other Ambulatory Visit: Payer: PPO

## 2019-11-14 ENCOUNTER — Ambulatory Visit: Payer: Self-pay | Admitting: Nurse Practitioner

## 2019-11-14 DIAGNOSIS — T63481D Toxic effect of venom of other arthropod, accidental (unintentional), subsequent encounter: Secondary | ICD-10-CM | POA: Diagnosis not present

## 2019-11-14 DIAGNOSIS — J302 Other seasonal allergic rhinitis: Secondary | ICD-10-CM | POA: Diagnosis not present

## 2019-11-17 LAB — IGE+ALLERGENS ZONE 2(30)

## 2019-11-17 LAB — ALLERGEN STINGING INSECT PANEL
Honeybee IgE: 1.65 kU/L — AB
Hornet, White Face, IgE: 0.97 kU/L — AB
Hornet, Yellow, IgE: 0.21 kU/L — AB
Paper Wasp IgE: 1.16 kU/L — AB
Yellow Jacket, IgE: 1.49 kU/L — AB

## 2019-11-17 LAB — TRYPTASE: Tryptase: 23.3 ug/L — ABNORMAL HIGH (ref 2.2–13.2)

## 2019-11-20 ENCOUNTER — Other Ambulatory Visit: Payer: Self-pay

## 2019-11-20 ENCOUNTER — Other Ambulatory Visit: Payer: PPO

## 2019-11-20 DIAGNOSIS — R899 Unspecified abnormal finding in specimens from other organs, systems and tissues: Secondary | ICD-10-CM | POA: Diagnosis not present

## 2019-11-20 NOTE — Addendum Note (Signed)
Addended by: Valentina Shaggy on: 11/20/2019 07:17 AM   Modules accepted: Orders

## 2019-11-22 LAB — TRYPTASE: Tryptase: 23.2 ug/L — ABNORMAL HIGH (ref 2.2–13.2)

## 2019-12-19 ENCOUNTER — Other Ambulatory Visit: Payer: Self-pay

## 2019-12-19 ENCOUNTER — Inpatient Hospital Stay (HOSPITAL_COMMUNITY): Payer: PPO | Attending: Hematology

## 2019-12-19 DIAGNOSIS — C49A3 Gastrointestinal stromal tumor of small intestine: Secondary | ICD-10-CM | POA: Insufficient documentation

## 2019-12-19 DIAGNOSIS — Z86718 Personal history of other venous thrombosis and embolism: Secondary | ICD-10-CM | POA: Insufficient documentation

## 2019-12-19 DIAGNOSIS — Z7901 Long term (current) use of anticoagulants: Secondary | ICD-10-CM | POA: Diagnosis not present

## 2019-12-19 DIAGNOSIS — D510 Vitamin B12 deficiency anemia due to intrinsic factor deficiency: Secondary | ICD-10-CM | POA: Diagnosis not present

## 2019-12-19 DIAGNOSIS — Z79899 Other long term (current) drug therapy: Secondary | ICD-10-CM | POA: Diagnosis not present

## 2019-12-19 DIAGNOSIS — I10 Essential (primary) hypertension: Secondary | ICD-10-CM | POA: Diagnosis not present

## 2019-12-19 DIAGNOSIS — D6851 Activated protein C resistance: Secondary | ICD-10-CM | POA: Insufficient documentation

## 2019-12-19 LAB — CBC WITH DIFFERENTIAL/PLATELET
Abs Immature Granulocytes: 0.02 10*3/uL (ref 0.00–0.07)
Basophils Absolute: 0 10*3/uL (ref 0.0–0.1)
Basophils Relative: 0 %
Eosinophils Absolute: 0.2 10*3/uL (ref 0.0–0.5)
Eosinophils Relative: 3 %
HCT: 45.3 % (ref 36.0–46.0)
Hemoglobin: 13.9 g/dL (ref 12.0–15.0)
Immature Granulocytes: 0 %
Lymphocytes Relative: 39 %
Lymphs Abs: 2.5 10*3/uL (ref 0.7–4.0)
MCH: 28.4 pg (ref 26.0–34.0)
MCHC: 30.7 g/dL (ref 30.0–36.0)
MCV: 92.6 fL (ref 80.0–100.0)
Monocytes Absolute: 0.7 10*3/uL (ref 0.1–1.0)
Monocytes Relative: 11 %
Neutro Abs: 3 10*3/uL (ref 1.7–7.7)
Neutrophils Relative %: 47 %
Platelets: 305 10*3/uL (ref 150–400)
RBC: 4.89 MIL/uL (ref 3.87–5.11)
RDW: 13.3 % (ref 11.5–15.5)
WBC: 6.4 10*3/uL (ref 4.0–10.5)
nRBC: 0 % (ref 0.0–0.2)

## 2019-12-19 LAB — COMPREHENSIVE METABOLIC PANEL
ALT: 24 U/L (ref 0–44)
AST: 22 U/L (ref 15–41)
Albumin: 3.8 g/dL (ref 3.5–5.0)
Alkaline Phosphatase: 67 U/L (ref 38–126)
Anion gap: 7 (ref 5–15)
BUN: 14 mg/dL (ref 8–23)
CO2: 27 mmol/L (ref 22–32)
Calcium: 9.2 mg/dL (ref 8.9–10.3)
Chloride: 105 mmol/L (ref 98–111)
Creatinine, Ser: 0.68 mg/dL (ref 0.44–1.00)
GFR, Estimated: >60 mL/min (ref >60)
Glucose, Bld: 95 mg/dL (ref 70–99)
Potassium: 4.6 mmol/L (ref 3.5–5.1)
Sodium: 139 mmol/L (ref 135–145)
Total Bilirubin: 0.5 mg/dL (ref 0.3–1.2)
Total Protein: 7.5 g/dL (ref 6.5–8.1)

## 2019-12-19 LAB — FOLATE: Folate: 17.8 ng/mL (ref >5.9)

## 2019-12-19 LAB — IRON AND TIBC
Iron: 80 ug/dL (ref 28–170)
Saturation Ratios: 26 % (ref 10.4–31.8)
TIBC: 305 ug/dL (ref 250–450)
UIBC: 225 ug/dL

## 2019-12-19 LAB — FERRITIN: Ferritin: 57 ng/mL (ref 11–307)

## 2019-12-19 LAB — VITAMIN B12: Vitamin B-12: 517 pg/mL (ref 180–914)

## 2019-12-19 LAB — LACTATE DEHYDROGENASE: LDH: 146 U/L (ref 98–192)

## 2019-12-26 ENCOUNTER — Inpatient Hospital Stay (HOSPITAL_BASED_OUTPATIENT_CLINIC_OR_DEPARTMENT_OTHER): Payer: PPO | Admitting: Hematology

## 2019-12-26 ENCOUNTER — Other Ambulatory Visit: Payer: Self-pay

## 2019-12-26 VITALS — BP 149/61 | HR 57 | Temp 97.3°F | Resp 18 | Wt 248.4 lb

## 2019-12-26 DIAGNOSIS — C49A3 Gastrointestinal stromal tumor of small intestine: Secondary | ICD-10-CM | POA: Diagnosis not present

## 2019-12-26 DIAGNOSIS — D6851 Activated protein C resistance: Secondary | ICD-10-CM

## 2019-12-26 NOTE — Patient Instructions (Signed)
[facility] at [facility] Discharge Instructions  You were seen today by Dr. Katragadda. He went over your recent results. Dr. Katragadda will see you back in 1 year for labs and follow up.   Thank you for choosing [facility] at [facility] to provide your oncology and hematology care.  To afford each patient quality time with our provider, please arrive at least 15 minutes before your scheduled appointment time.   If you have a lab appointment with the Cancer Center please come in thru the Main Entrance and check in at the main information desk  You need to re-schedule your appointment should you arrive 10 or more minutes late.  We strive to give you quality time with our providers, and arriving late affects you and other patients whose appointments are after yours.  Also, if you no show three or more times for appointments you may be dismissed from the clinic at the providers discretion.     Again, thank you for choosing [facility].  Our hope is that these requests will decrease the amount of time that you wait before being seen by our physicians.       _____________________________________________________________  Should you have questions after your visit to  Cancer Center, please contact our office at [phone] between the hours of 8:00 a.m. and 4:30 p.m.  Voicemails left after 4:00 p.m. will not be returned until the following business day.  For prescription refill requests, have your pharmacy contact our office and allow 72 hours.    Cancer Center Support Programs:   > Cancer Support Group  2nd Tuesday of the month 1pm-2pm, Journey Room    

## 2019-12-26 NOTE — Progress Notes (Signed)
[name], [place] [address]   CLINIC:  Medical Oncology/Hematology  PCP:  [name], [place] / [place] [address] [phone]   REASON FOR VISIT:  Follow-up for factor V Leiden mutation AND GIST of small intestine  PRIOR THERAPY:  1. Partial small bowel resection on 01/05/2011. 2. Adjuvant imatinib stopped due to cardiac arrhythmias.  NGS Results: Not done  CURRENT THERAPY: Xarelto and observation  BRIEF ONCOLOGIC HISTORY:  Oncology History  GIST (gastrointestinal stromal tumor), malignant ([place])  01/03/2011 Initial Diagnosis   GIST (gastrointestinal stromal tumor), malignant ([place])   01/03/2011 Imaging   CT abd/pelvis- 5.0 cm soft tissue mass associated with distal small bowel. Findings characteristic of gastrointestinal stromal tumor. Adenocarcinoma or malignant transformation are not excluded.  Stable left adrenal adenoma.   04/03/2011 Imaging   CT abd/pelvis- Small bowel obstruction at small bowel anastomosis. Single loop of small bowel in the left mid abdomen shows wall thickening, nonspecific; this can be seen with infection, inflammatory bowel disease and ischemia. Free intraperitoneal fluid. Small right pleural effusion and bibasilar atelectasis. Left adrenal adenoma.   01/25/2012 Imaging   CT abd/pelvis- Prior small bowel resection with anastomoses in the left mid abdomen.  No evidence of metastatic disease in the abdomen/pelvis.  No evidence of bowel obstruction.  Focal eccentric wall thickening along the lateral aspect of the cecum.  Colonoscopy is suggested to exclude a primary colonic neoplasm.    01/25/2013 Imaging   CT abd/pelvis- 1. Thickening through the gastric cardiac region likely represents redundant folds of normal mucosa. Recommend attention on follow-up. 2. Mild haziness to the central mesentery is similar to and prior likely related to prior bowel surgery. No  evidence of GIST recurrence within the small bowel. 3. No evidence of adenopathy or mass in the abdomen or pelvis. 4. Stable left adrenal adenoma. 5. 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   01/15/2014 Imaging   CT abd/pelvis- Stable exam. No evidence for recurrent disease in the abdomen or pelvis.  Stable left adrenal adenoma.  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   01/10/2015 Imaging   CT abd/pelvis- 1. Stable exam. No evidence for recurrent disease within the abdomen or pelvis. 2. Stable left adrenal gland adenoma. 3. 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   01/23/2015 Imaging   CT abd/pelvis- Small bowel obstruction with a transition zone just distal to the ileo ileal anastomosis in the anterior pelvis and secondary to peritoneal adhesions.     Macdonald STAGING: Macdonald Staging GIST (gastrointestinal stromal tumor), malignant ([place]) Staging form: Soft Tissue Sarcoma, AJCC 7th Edition - Clinical: Stage IIB (T2, N0, M0) - Signed by Denise Cancer, PA on 01/29/2011   INTERVAL HISTORY:  [name], a 69 y.o. female, returns for routine follow-up of her factor V Leiden mutation and GIST of small intestine. [name] was last seen on 01/03/2019.   Today she reports feeling well. She is taking Xarelto and denies having nosebleeds, hematochezia or hematuria. She denies having any abdominal pain. She complains of having pain in her knee which is worse when she walks. She continues administering B12 injections monthly.   REVIEW OF SYSTEMS:  Review of Systems  Constitutional: Positive for fatigue (75%). Negative for appetite change.  HENT:   Negative for nosebleeds.   Gastrointestinal: Negative for blood in stool.  Genitourinary: Negative for hematuria.  Musculoskeletal: Positive for arthralgias (knee pain).  Psychiatric/Behavioral: Positive for depression (occasional) and  sleep disturbance. The patient is nervous/anxious (occasional).   All other systems reviewed and are negative.   PAST MEDICAL/SURGICAL HISTORY:  Past Medical History:  Diagnosis Date  . Allergic urticaria 01/04/2011   Rash from tape.  . Anxiety   . Cellulitis of left leg 2006  . Clotting disorder ([place])    heterozygosity from factor v leiden  . DVT (deep venous thrombosis) ([place]) 07/10/2010   on coumadin  . Dyspnea    with exertion  . Factor V Leiden ([place])   . GERD (gastroesophageal reflux disease)   . GIST (gastrointestinal stromal tumor), malignant ([place]) 01/03/2011   S/P resection on 01/05/11.  Intolerant to [employer]. Skin Macdonald Left hand  . History of blood transfusion   . Hypertension   . Pernicious anemia 07/10/2010  . Small bowel mass 01/03/2011   s/p surgery  . Ulcer 05/2009   esophageal  . Ventricular tachycardia ([place]) 03/26/11  . Vitamin B12 deficiency    vit b12 1000 mcg monthly   Past Surgical History:  Procedure Laterality Date  . ABDOMINAL HYSTERECTOMY  1989  . BALLOON DILATION  12/11/2010   Procedure: BALLOON DILATION;  Surgeon: Rogene Houston, MD;  [facility];  Service: Endoscopy;  Laterality: N/A;  . BOWEL RESECTION  01/05/2011   Procedure: SMALL BOWEL RESECTION;  Surgeon: Jamesetta So;  Location: [facility] ORS;  Service: General;;  Partial Small Bowel Resection  . COLONOSCOPY  02/25/2012   Procedure: COLONOSCOPY;  Surgeon: Rogene Houston, MD;  [facility];  Service: Endoscopy;  Laterality: N/A;  1200  . GIVENS CAPSULE STUDY  01/02/2011   Procedure: GIVENS CAPSULE STUDY;  Surgeon: Rogene Houston, MD;  [facility];  Service: Endoscopy;  Laterality: N/A;  . LAPAROTOMY  01/05/2011   Procedure: EXPLORATORY LAPAROTOMY;  Surgeon: Jamesetta So;  Location: [facility];  Service: General;  Laterality: N/A;  . OPEN REDUCTION INTERNAL FIXATION (ORIF) SCAPHOID WITH DISTAL RADIUS GRAFT Right 02/01/2016   Procedure: Right distal radius open reduction  and internal fixation and repair as indicated;  Surgeon: Iran Planas, MD;  Location: [facility];  Service: Orthopedics;  Laterality: Right;  Requests 90 mins    SOCIAL HISTORY:  Social History   Socioeconomic History  . Marital status: Married    Spouse name: Denise Rinks  . Number of children: 3  . Years of education: Not on file  . Highest education level: Not on file  Occupational History    Employer: [employer]  . Occupation: CNA    Employer: [employer]  Tobacco Use  . Smoking status: Never Smoker  . Smokeless tobacco: Never Used  Vaping Use  . Vaping Use: Never used  Substance and Sexual Activity  . Alcohol use: No  . Drug use: No  . Sexual activity: Not Currently  Other Topics Concern  . Not on file  Social History Narrative  . Not on file   Social Determinants of Health   Financial Resource Strain: Low Risk   . Difficulty of Paying Living Expenses: Not hard at all  Food Insecurity: No Food Insecurity  . Worried About Charity fundraiser in the Last Year: Never true  . Ran Out of Food in the Last Year: Never true  Transportation Needs: No Transportation Needs  . Lack of Transportation (Medical): No  . Lack of Transportation (Non-Medical): No  Physical Activity: Insufficiently Active  .  Days of Exercise per Week: 3 days  . Minutes of Exercise per Session: 30 min  Stress: No Stress Concern Present  . Feeling of Stress : Not at all  Social Connections: Socially Integrated  . Frequency of Communication with Friends and Family: More than three times a week  . Frequency of Social Gatherings with Friends and Family: Three times a week  . Attends Religious Services: More than 4 times per year  . Active Member of Clubs or Organizations: Yes  . Attends Archivist Meetings: More than 4 times per year  . Marital Status: Married  Human resources officer Violence: Not At Risk  . Fear of Current or Ex-Partner: No  . Emotionally Abused: No  . Physically Abused: No  . Sexually  Abused: No    FAMILY HISTORY:  Family History  Problem Relation Age of Onset  . Depression Mother   . Parkinson's disease Mother   . Allergic rhinitis Brother   . Angioedema Neg Hx   . Asthma Neg Hx   . Eczema Neg Hx   . Immunodeficiency Neg Hx   . Urticaria Neg Hx     CURRENT MEDICATIONS:  Current Outpatient Medications  Medication Sig Dispense Refill  . acetaminophen (TYLENOL) 500 MG tablet Take 500-1,000 mg by mouth every 6 (six) hours as needed for moderate pain.     . cyanocobalamin (,VITAMIN B-12,) 1000 MCG/ML injection INJECT 1ML IM EVERY 30 DAYS 1 mL 11  . diphenhydrAMINE (BENADRYL) 25 mg capsule Take 25 mg by mouth every 6 (six) hours as needed for allergies (bee stings).     . metoprolol tartrate (LOPRESSOR) 25 MG tablet TAKE 1/2 TABLET TWICE DAILY 60 tablet 1  . omeprazole (PRILOSEC) 20 MG capsule Take 1 capsule (20 mg total) by mouth daily. 90 capsule 1  . Probiotic Product (PROBIOTIC-10 ULTIMATE) CAPS Take by mouth.    Alveda Reasons 20 MG TABS tablet TAKE 1 TABLET DAILY WITH SUPPER 30 tablet 11   No current facility-administered medications for this visit.    ALLERGIES:  Allergies  Allergen Reactions  . Bee Venom Swelling and Rash    SWELLING REACTION UNSPECIFIED  SWELLING REACTION UNSPECIFIED   . Cephalexin Swelling    PATIENT WITH Rx OF ANAPHYLAXIS TO PCN's SWELLING REACTION UNSPECIFIED  PATIENT WITH Rx OF ANAPHYLAXIS TO PCN's SWELLING REACTION UNSPECIFIED   . Dexlansoprazole Swelling    SWELLING REACTION UNSPECIFIED  SWELLING REACTION UNSPECIFIED   . Imatinib Other (See Comments)    Cardiac dysrhythmia  . Other Swelling    PECANS MOUTH SWELLS  . Penicillins Anaphylaxis    Has patient had a PCN reaction causing immediate rash, facial/tongue/throat swelling, SOB or lightheadedness with hypotension: No no Has patient had a PCN reaction causing severe rash involving mucus membranes or skin necrosis: No Has patient had a PCN reaction that required  hospitalization No Has patient had a PCN reaction occurring within the last 10 years: No If all of the above answers are "NO", then may proceed with Cephalosporin use.   . Latex Itching  . Nylon Rash  . Sulfa Antibiotics Rash  . Sulfonamide Derivatives Rash  . Tape Itching    Paper tape is ok Paper tape is ok    PHYSICAL EXAM:  Performance status (ECOG): 1 - Symptomatic but completely ambulatory  Vitals:   12/26/19 1025  BP: (!) 149/61  Pulse: (!) 57  Resp: 18  Temp: (!) 97.3 F (36.3 C)  SpO2: 98%   Wt Readings from Last  3 Encounters:  12/26/19 248 lb 6.4 oz (112.7 kg)  11/08/19 245 lb 3.2 oz (111.2 kg)  05/17/19 245 lb (111.1 kg)   Physical Exam Vitals reviewed.  Constitutional:      Appearance: Normal appearance. She is obese.  Cardiovascular:     Rate and Rhythm: Normal rate and regular rhythm.     Pulses: Normal pulses.     Heart sounds: Normal heart sounds.  Pulmonary:     Effort: Pulmonary effort is normal.     Breath sounds: Normal breath sounds.  Musculoskeletal:     Right lower leg: No edema.     Left lower leg: No edema.     Left ankle: No swelling or ecchymosis. Tenderness present over the medial malleolus (anterior to).  Neurological:     General: No focal deficit present.     Mental Status: She is alert and oriented to person, place, and time.  Psychiatric:        Mood and Affect: Mood normal.        Behavior: Behavior normal.      LABORATORY DATA:  I have reviewed the labs as listed.  CBC Latest Ref Rng & Units 12/19/2019 07/11/2019 12/28/2018  WBC 4.0 - 10.5 K/uL 6.4 5.5 6.1  Hemoglobin 12.0 - 15.0 g/dL 13.9 13.7 13.9  Hematocrit 36 - 46 % 45.3 44.5 44.7  Platelets 150 - 400 K/uL 305 305 294   CMP Latest Ref Rng & Units 12/19/2019 07/11/2019 12/28/2018  Glucose 70 - 99 mg/dL 95 100(H) 95  BUN 8 - 23 mg/dL 14 13 12   Creatinine 0.44 - 1.00 mg/dL 0.68 0.65 0.60  Sodium 135 - 145 mmol/L 139 141 139  Potassium 3.5 - 5.1 mmol/L 4.6 4.3 4.2    Chloride 98 - 111 mmol/L 105 106 108  CO2 22 - 32 mmol/L 27 26 22   Calcium 8.9 - 10.3 mg/dL 9.2 9.0 9.3  Total Protein 6.5 - 8.1 g/dL 7.5 7.5 7.4  Total Bilirubin 0.3 - 1.2 mg/dL 0.5 0.6 0.4  Alkaline Phos 38 - 126 U/L 67 76 79  AST 15 - 41 U/L 22 21 24   ALT 0 - 44 U/L 24 26 30    Lab Results  Component Value Date   LDH 146 12/19/2019   LDH 165 07/11/2019   LDH 138 12/28/2018   Lab Results  Component Value Date   TIBC 305 12/19/2019   TIBC 326 07/11/2019   TIBC 307 12/28/2018   FERRITIN 57 12/19/2019   FERRITIN 44 07/11/2019   FERRITIN 49 12/28/2018   IRONPCTSAT 26 12/19/2019   IRONPCTSAT 34 (H) 07/11/2019   IRONPCTSAT 28 12/28/2018    DIAGNOSTIC IMAGING:  I have independently reviewed the scans and discussed with the patient. No results found.   ASSESSMENT:  1.  T3N0 Gist of small bowel: -Status post resection on 01/05/2011, pathology showing 5.8 cm, 0/8 lymph nodes involved, margins negative, 1 mitosis/50 HPF, intermediate grade.  Presentation was with GI bleed. -Adjuvant imatinib complicated by cardiac arrhythmias. -Last CT CAP dated 01/31/2017 did not show any evidence of metastatic disease.  2.  Recurrent DVT: -She was on warfarin for 32 years and was switched to Xarelto in August 2018. - She also has factor V Leiden heterozygosity..  3.  Pernicious anemia: -She is on B12 injections monthly.  4.  Health maintenance: -Mammogram on 05/18/2019 was BI-RADS Category 1.   PLAN:  1.  T3N0 Gist of small bowel: -Presentation with GI bleed.  No symptoms of GI  bleed at this time.  No abdominal pains.  No palpable masses. -RTC 1 year with labs.  2.  Recurrent DVT: -Continue indefinite Xarelto.  No bleeding issues.  3.  Pernicious anemia: -Reviewed CBC from 12/19/2019 which showed normal hemoglobin.  Vitamin B12 is 517.  Continue B12 injections every 4 weeks.     Orders placed this encounter:  No orders of the defined types were placed in this  encounter.    Derek Jack, MD [place] [phone]   I, Milinda Antis, am acting as a scribe for Dr. Sanda Linger.  I, Derek Jack MD, have reviewed the above documentation for accuracy and completeness, and I agree with the above.

## 2020-01-08 ENCOUNTER — Other Ambulatory Visit (HOSPITAL_COMMUNITY): Payer: PPO

## 2020-01-15 ENCOUNTER — Ambulatory Visit (HOSPITAL_COMMUNITY): Payer: PPO | Admitting: Hematology

## 2020-01-18 ENCOUNTER — Other Ambulatory Visit: Payer: Self-pay | Admitting: Nurse Practitioner

## 2020-01-18 DIAGNOSIS — I471 Supraventricular tachycardia: Secondary | ICD-10-CM

## 2020-01-30 ENCOUNTER — Other Ambulatory Visit: Payer: Self-pay | Admitting: Nurse Practitioner

## 2020-01-30 DIAGNOSIS — K219 Gastro-esophageal reflux disease without esophagitis: Secondary | ICD-10-CM

## 2020-01-31 DIAGNOSIS — I471 Supraventricular tachycardia: Secondary | ICD-10-CM | POA: Diagnosis not present

## 2020-01-31 DIAGNOSIS — D519 Vitamin B12 deficiency anemia, unspecified: Secondary | ICD-10-CM | POA: Diagnosis not present

## 2020-01-31 DIAGNOSIS — Z13220 Encounter for screening for lipoid disorders: Secondary | ICD-10-CM | POA: Diagnosis not present

## 2020-01-31 DIAGNOSIS — Z1329 Encounter for screening for other suspected endocrine disorder: Secondary | ICD-10-CM | POA: Diagnosis not present

## 2020-02-06 DIAGNOSIS — I82509 Chronic embolism and thrombosis of unspecified deep veins of unspecified lower extremity: Secondary | ICD-10-CM | POA: Diagnosis not present

## 2020-02-06 DIAGNOSIS — Z6838 Body mass index (BMI) 38.0-38.9, adult: Secondary | ICD-10-CM | POA: Diagnosis not present

## 2020-02-06 DIAGNOSIS — Z7901 Long term (current) use of anticoagulants: Secondary | ICD-10-CM | POA: Diagnosis not present

## 2020-02-06 DIAGNOSIS — R0600 Dyspnea, unspecified: Secondary | ICD-10-CM | POA: Diagnosis not present

## 2020-02-06 DIAGNOSIS — D682 Hereditary deficiency of other clotting factors: Secondary | ICD-10-CM | POA: Diagnosis not present

## 2020-02-06 DIAGNOSIS — I499 Cardiac arrhythmia, unspecified: Secondary | ICD-10-CM | POA: Diagnosis not present

## 2020-02-06 DIAGNOSIS — C49A3 Gastrointestinal stromal tumor of small intestine: Secondary | ICD-10-CM | POA: Diagnosis not present

## 2020-02-06 DIAGNOSIS — I471 Supraventricular tachycardia: Secondary | ICD-10-CM | POA: Diagnosis not present

## 2020-03-14 ENCOUNTER — Other Ambulatory Visit: Payer: Self-pay | Admitting: Nurse Practitioner

## 2020-03-14 DIAGNOSIS — I471 Supraventricular tachycardia: Secondary | ICD-10-CM

## 2020-04-17 ENCOUNTER — Other Ambulatory Visit (HOSPITAL_COMMUNITY): Payer: Self-pay | Admitting: Hematology

## 2020-04-17 DIAGNOSIS — Z7901 Long term (current) use of anticoagulants: Secondary | ICD-10-CM

## 2020-04-17 DIAGNOSIS — I82402 Acute embolism and thrombosis of unspecified deep veins of left lower extremity: Secondary | ICD-10-CM

## 2020-04-29 DIAGNOSIS — I878 Other specified disorders of veins: Secondary | ICD-10-CM | POA: Diagnosis not present

## 2020-04-29 DIAGNOSIS — L0390 Cellulitis, unspecified: Secondary | ICD-10-CM | POA: Diagnosis not present

## 2020-04-29 DIAGNOSIS — Z6838 Body mass index (BMI) 38.0-38.9, adult: Secondary | ICD-10-CM | POA: Diagnosis not present

## 2020-05-08 ENCOUNTER — Ambulatory Visit: Payer: PPO | Admitting: Allergy & Immunology

## 2020-05-14 ENCOUNTER — Other Ambulatory Visit (HOSPITAL_COMMUNITY): Payer: Self-pay | Admitting: Hematology

## 2020-05-14 DIAGNOSIS — Z1231 Encounter for screening mammogram for malignant neoplasm of breast: Secondary | ICD-10-CM

## 2020-05-22 ENCOUNTER — Ambulatory Visit (HOSPITAL_COMMUNITY)
Admission: RE | Admit: 2020-05-22 | Discharge: 2020-05-22 | Disposition: A | Discharge status: Home / Self Care | Payer: PPO | Source: Ambulatory Visit | Attending: Hematology | Admitting: Hematology

## 2020-05-22 DIAGNOSIS — Z1231 Encounter for screening mammogram for malignant neoplasm of breast: Secondary | ICD-10-CM | POA: Insufficient documentation

## 2020-06-10 DIAGNOSIS — Z6838 Body mass index (BMI) 38.0-38.9, adult: Secondary | ICD-10-CM | POA: Diagnosis not present

## 2020-06-10 DIAGNOSIS — L97929 Non-pressure chronic ulcer of unspecified part of left lower leg with unspecified severity: Secondary | ICD-10-CM | POA: Diagnosis not present

## 2020-06-10 DIAGNOSIS — I471 Supraventricular tachycardia: Secondary | ICD-10-CM | POA: Diagnosis not present

## 2020-06-12 ENCOUNTER — Ambulatory Visit (INDEPENDENT_AMBULATORY_CARE_PROVIDER_SITE_OTHER): Payer: PPO | Admitting: Nurse Practitioner

## 2020-06-12 ENCOUNTER — Encounter: Payer: Self-pay | Admitting: Nurse Practitioner

## 2020-06-12 ENCOUNTER — Other Ambulatory Visit: Payer: Self-pay

## 2020-06-12 VITALS — BP 177/81 | HR 63 | Temp 98.1°F | Resp 20 | Ht 66.0 in | Wt 245.0 lb

## 2020-06-12 DIAGNOSIS — I471 Supraventricular tachycardia, unspecified: Secondary | ICD-10-CM

## 2020-06-12 DIAGNOSIS — K219 Gastro-esophageal reflux disease without esophagitis: Secondary | ICD-10-CM

## 2020-06-12 DIAGNOSIS — C49A3 Gastrointestinal stromal tumor of small intestine: Secondary | ICD-10-CM | POA: Diagnosis not present

## 2020-06-12 DIAGNOSIS — F3341 Major depressive disorder, recurrent, in partial remission: Secondary | ICD-10-CM

## 2020-06-12 DIAGNOSIS — I82522 Chronic embolism and thrombosis of left iliac vein: Secondary | ICD-10-CM

## 2020-06-12 DIAGNOSIS — I10 Essential (primary) hypertension: Secondary | ICD-10-CM

## 2020-06-12 DIAGNOSIS — D508 Other iron deficiency anemias: Secondary | ICD-10-CM | POA: Diagnosis not present

## 2020-06-12 DIAGNOSIS — D510 Vitamin B12 deficiency anemia due to intrinsic factor deficiency: Secondary | ICD-10-CM | POA: Diagnosis not present

## 2020-06-12 MED ORDER — METOPROLOL TARTRATE 25 MG PO TABS
12.5000 mg | ORAL_TABLET | Freq: Two times a day (BID) | ORAL | 0 refills | Status: DC
Start: 2020-06-12 — End: 2020-11-11

## 2020-06-12 MED ORDER — LISINOPRIL 20 MG PO TABS
20.0000 mg | ORAL_TABLET | Freq: Every day | ORAL | 1 refills | Status: DC
Start: 2020-06-12 — End: 2020-09-06

## 2020-06-12 MED ORDER — OMEPRAZOLE 20 MG PO CPDR: 20.0000 mg | DELAYED_RELEASE_CAPSULE | Freq: Every day | ORAL | 0 refills | Status: DC

## 2020-06-12 NOTE — Progress Notes (Signed)
Subjective:    Patient ID: [name], female    DOB: [birth year], 70 y.o.   MRN: [id number]   Chief Complaint: Medical Management of Chronic Issues    HPI:  1. Chronic deep vein thrombosis (DVT) of iliac vein of left lower extremity ([name]) Was 35 years ago. She was on blood thinner for 6 months. Stopped meds and developed another one and had to be put back on it and has been on it every since. Has permanent skin discoloration. Is currently on xarelto  2. SVT (supraventricular tachycardia) ([name]) denies any palpitations or heart racing. Is on metoprolol BID ( was recently changes to extended release at [facility], now is back to BID )  3. 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 She had chemo pill and surgery. She still sees oncology yearly. No more signs of metastasis  or reoccurence  4. Recurrent major depressive disorder, in partial remission ([place]) She is currently not on anything for depression. She only has problems around the time her son was killed or his birthday. Depression screen [facility] 2/9 06/12/2020 05/31/2019 05/11/2019  Decreased Interest 0 0 0  Down, Depressed, Hopeless 0 0 0  PHQ - 2 Score 0 0 0  Altered sleeping 0 - -  Tired, decreased energy 0 - -  Change in appetite 0 - -  Feeling bad or failure about yourself  0 - -  Trouble concentrating 0 - -  Moving slowly or fidgety/restless 0 - -  Suicidal thoughts 0 - -  PHQ-9 Score 0 - -  Difficult doing work/chores Not difficult at all - -  Some recent data might be hidden     5. Hyertension, The metoprolol she is taking for SVT lowers her blood pressure. She occasionally checks it at home and is usually 269-485 systolic.  6. Other iron deficiency anemia No c/o fatigue Lab Results  Component Value Date   HGB 13.9 12/19/2019     7. Pernicious anemia Is on monthly b12 injection due to having distal part of colon removed. She gives them to herself.  8 obese No  recent weight changes Wt Readings from Last 3 Encounters:  06/12/20 245 lb (111.1 kg)  12/26/19 248 lb 6.4 oz (112.7 kg)  11/08/19 245 lb 3.2 oz (111.2 kg)   BMI Readings from Last 3 Encounters:  06/12/20 39.54 kg/m  12/26/19 39.61 kg/m  11/08/19 39.10 kg/m     Outpatient Encounter Medications as of 06/12/2020  Medication Sig  . acetaminophen (TYLENOL) 500 MG tablet Take 500-1,000 mg by mouth every 6 (six) hours as needed for moderate pain.   . cyanocobalamin (,VITAMIN B-12,) 1000 MCG/ML injection INJECT 1ML IM EVERY 30 DAYS  . diphenhydrAMINE (BENADRYL) 25 mg capsule Take 25 mg by mouth every 6 (six) hours as needed for allergies (bee stings).   . metoprolol tartrate (LOPRESSOR) 25 MG tablet Take 0.5 tablets (12.5 mg total) by mouth 2 (two) times daily. (Needs to be seen before next refill)  . omeprazole (PRILOSEC) 20 MG capsule Take 1 capsule (20 mg total) by mouth daily. (Needs to be seen before next refill)  . Probiotic Product (PROBIOTIC-10 ULTIMATE) CAPS Take by mouth.  Alveda Reasons 20 MG TABS tablet TAKE 1 TABLET DAILY WITH SUPPER   No facility-administered encounter medications on file as of 06/12/2020.    Past Surgical History:  Procedure Laterality Date  . ABDOMINAL HYSTERECTOMY  1989  . BALLOON DILATION  12/11/2010   Procedure: BALLOON  DILATION;  Surgeon: Rogene Houston, MD;  [facility];  Service: Endoscopy;  Laterality: N/A;  . BOWEL RESECTION  01/05/2011   Procedure: SMALL BOWEL RESECTION;  Surgeon: Jamesetta So;  Location: [facility];  Service: General;;  Partial Small Bowel Resection  . COLONOSCOPY  02/25/2012   Procedure: COLONOSCOPY;  Surgeon: Rogene Houston, MD;  [facility];  Service: Endoscopy;  Laterality: N/A;  1200  . GIVENS CAPSULE STUDY  01/02/2011   Procedure: GIVENS CAPSULE STUDY;  Surgeon: Rogene Houston, MD;  [facility];  Service: Endoscopy;  Laterality: N/A;  . LAPAROTOMY  01/05/2011   Procedure: EXPLORATORY  LAPAROTOMY;  Surgeon: Jamesetta So;  Location: [facility];  Service: General;  Laterality: N/A;  . OPEN REDUCTION INTERNAL FIXATION (ORIF) SCAPHOID WITH DISTAL RADIUS GRAFT Right 02/01/2016   Procedure: Right distal radius open reduction and internal fixation and repair as indicated;  Surgeon: Iran Planas, MD;  Location: [facility];  Service: Orthopedics;  Laterality: Right;  Requests 90 mins    Family History  Problem Relation Age of Onset  . Depression Mother   . Parkinson's disease Mother   . Allergic rhinitis Brother   . Angioedema Neg Hx   . Asthma Neg Hx   . Eczema Neg Hx   . Immunodeficiency Neg Hx   . Urticaria Neg Hx     New complaints: Saw someone at [place] last week because her leg was swelling and they were not any help at all. Her leg had started swelling after her metoprolol was changed to extended release.  Social history: Lives with her husband  Controlled substance contract: n/a    Review of Systems  Constitutional: Negative for diaphoresis.  Eyes: Negative for pain.  Respiratory: Negative for shortness of breath.   Cardiovascular: Negative for chest pain, palpitations and leg swelling.  Gastrointestinal: Negative for abdominal pain.  Endocrine: Negative for polydipsia.  Skin: Negative for rash.  Neurological: Negative for dizziness, weakness and headaches.  Hematological: Does not bruise/bleed easily.  All other systems reviewed and are negative.      Objective:   Physical Exam Vitals and nursing note reviewed.  Constitutional:      General: She is not in acute distress.    Appearance: Normal appearance. She is well-developed.  HENT:     Head: Normocephalic.     Nose: Nose normal.  Eyes:     Pupils: Pupils are equal, round, and reactive to light.  Neck:     Vascular: No carotid bruit or JVD.  Cardiovascular:     Rate and Rhythm: Normal rate and regular rhythm.     Heart sounds: Normal heart sounds.  Pulmonary:     Effort: Pulmonary effort is  normal. No respiratory distress.     Breath sounds: Normal breath sounds. No wheezing or rales.  Chest:     Chest wall: No tenderness.  Abdominal:     General: Bowel sounds are normal. There is no distension or abdominal bruit.     Palpations: Abdomen is soft. There is no hepatomegaly, splenomegaly, mass or pulsatile mass.     Tenderness: There is no abdominal tenderness.  Musculoskeletal:        General: Normal range of motion.     Cervical back: Normal range of motion and neck supple.  Lymphadenopathy:     Cervical: No cervical adenopathy.  Skin:    General: Skin is warm and dry.     Comments: Circulatory skin changes  of left medial ankle  Neurological:     Mental Status: She is alert and oriented to person, place, and time.     Deep Tendon Reflexes: Reflexes are normal and symmetric.  Psychiatric:        Behavior: Behavior normal.        Thought Content: Thought content normal.        Judgment: Judgment normal.         BP (!) 177/81   Pulse 63   Temp 98.1 F (36.7 C) (Temporal)   Resp 20   Ht '5\' 6"'  (1.676 m)   Wt 245 lb (111.1 kg)   SpO2 96%   BMI 39.54 kg/m      Assessment & Plan:  [name] comes in today with chief complaint of Medical Management of Chronic Issues   Diagnosis and orders addressed:  1. Chronic deep vein thrombosis (DVT) of iliac vein of left lower extremity ([name]) Elevate leg when sitting Continue xeraltio  2. SVT (supraventricular tachycardia) ([name]) Void caffeine - metoprolol tartrate (LOPRESSOR) 25 MG tablet; Take 0.5 tablets (12.5 mg total) by mouth 2 (two) times daily. (Needs to be seen before next refill)  Dispense: 60 tablet; Refill: 0  3. Malignant gastrointestinal stromal tumor (GIST) of small intestine ([name]) Keep follow up with cardilogy  4. Recurrent major depressive disorder, in partial remission ([place]) Stress management  5. Primary hypertension Low sodium diet - lisinopril (ZESTRIL) 20 MG tablet; Take 1 tablet (20  mg total) by mouth daily.  Dispense: 90 tablet; Refill: 1 - CBC with Differential/Platelet - CMP14+EGFR - Lipid panel  6. Other iron deficiency anemia Continue daily iron supplement  7. Pernicious anemia Continue b12 injections - Vitamin B12  8. Gastroesophageal reflux disease without esophagitis Avoid spicy foods Do not eat 2 hours prior to bedtime - omeprazole (PRILOSEC) 20 MG capsule; Take 1 capsule (20 mg total) by mouth daily. (Needs to be seen before next refill)  Dispense: 30 capsule; Refill: 0   Labs pending Health Maintenance reviewed Diet and exercise encouraged  Follow up plan: 6 months   [name] Hassell Done, FNP

## 2020-06-12 NOTE — Patient Instructions (Signed)

## 2020-06-13 ENCOUNTER — Other Ambulatory Visit (HOSPITAL_COMMUNITY): Payer: Self-pay | Admitting: Hematology

## 2020-06-13 DIAGNOSIS — D510 Vitamin B12 deficiency anemia due to intrinsic factor deficiency: Secondary | ICD-10-CM

## 2020-06-13 LAB — CBC WITH DIFFERENTIAL/PLATELET
Basophils Absolute: 0 10*3/uL (ref 0.0–0.2)
Basos: 0 %
EOS (ABSOLUTE): 0.3 10*3/uL (ref 0.0–0.4)
Eos: 4 %
Hematocrit: 41.3 % (ref 34.0–46.6)
Hemoglobin: 13.6 g/dL (ref 11.1–15.9)
Immature Grans (Abs): 0 10*3/uL (ref 0.0–0.1)
Immature Granulocytes: 0 %
Lymphocytes Absolute: 3 10*3/uL (ref 0.7–3.1)
Lymphs: 42 %
MCH: 28.8 pg (ref 26.6–33.0)
MCHC: 32.9 g/dL (ref 31.5–35.7)
MCV: 88 fL (ref 79–97)
Monocytes Absolute: 0.7 10*3/uL (ref 0.1–0.9)
Monocytes: 10 %
Neutrophils Absolute: 3.1 10*3/uL (ref 1.4–7.0)
Neutrophils: 44 %
Platelets: 290 10*3/uL (ref 150–450)
RBC: 4.72 x10E6/uL (ref 3.77–5.28)
RDW: 13.1 % (ref 11.7–15.4)
WBC: 7.1 10*3/uL (ref 3.4–10.8)

## 2020-06-13 LAB — CMP14+EGFR
ALT: 19 IU/L (ref 0–32)
AST: 17 IU/L (ref 0–40)
Albumin/Globulin Ratio: 1.3 (ref 1.2–2.2)
Albumin: 4.1 g/dL (ref 3.8–4.8)
Alkaline Phosphatase: 84 IU/L (ref 44–121)
BUN/Creatinine Ratio: 20 (ref 12–28)
BUN: 14 mg/dL (ref 8–27)
Bilirubin Total: <0.2 mg/dL (ref 0.0–1.2)
CO2: 23 mmol/L (ref 20–29)
Calcium: 8.9 mg/dL (ref 8.7–10.3)
Chloride: 107 mmol/L — ABNORMAL HIGH (ref 96–106)
Creatinine, Ser: 0.7 mg/dL (ref 0.57–1.00)
Globulin, Total: 3.1 g/dL (ref 1.5–4.5)
Glucose: 93 mg/dL (ref 65–99)
Potassium: 4.6 mmol/L (ref 3.5–5.2)
Sodium: 143 mmol/L (ref 134–144)
Total Protein: 7.2 g/dL (ref 6.0–8.5)
eGFR: 94 mL/min/{1.73_m2} (ref >59)

## 2020-06-13 LAB — LIPID PANEL
Chol/HDL Ratio: 4.2 ratio (ref 0.0–4.4)
Cholesterol, Total: 198 mg/dL (ref 100–199)
HDL: 47 mg/dL (ref >39)
LDL Chol Calc (NIH): 124 mg/dL — ABNORMAL HIGH (ref 0–99)
Triglycerides: 153 mg/dL — ABNORMAL HIGH (ref 0–149)
VLDL Cholesterol Cal: 27 mg/dL (ref 5–40)

## 2020-06-13 LAB — VITAMIN B12: Vitamin B-12: 521 pg/mL (ref 232–1245)

## 2020-06-14 NOTE — Progress Notes (Signed)
Pt returning call about labs  

## 2020-06-25 ENCOUNTER — Ambulatory Visit (INDEPENDENT_AMBULATORY_CARE_PROVIDER_SITE_OTHER): Payer: PPO | Admitting: Nurse Practitioner

## 2020-06-25 ENCOUNTER — Other Ambulatory Visit: Payer: Self-pay

## 2020-06-25 ENCOUNTER — Encounter: Payer: Self-pay | Admitting: Nurse Practitioner

## 2020-06-25 VITALS — BP 146/76 | HR 66 | Temp 98.3°F | Resp 20 | Ht 66.0 in | Wt 244.0 lb

## 2020-06-25 DIAGNOSIS — L03116 Cellulitis of left lower limb: Secondary | ICD-10-CM | POA: Diagnosis not present

## 2020-06-25 MED ORDER — CIPROFLOXACIN HCL 500 MG PO TABS: 500.0000 mg | ORAL_TABLET | Freq: Two times a day (BID) | ORAL | 0 refills | Status: DC

## 2020-06-25 NOTE — Progress Notes (Signed)
   Subjective:    Patient ID: [name], female    DOB: Sep 13, 1950, 70 y.o.   MRN: [id number]   Chief Complaint: Recheck left leg   HPI Patient come sin today c/o left leg swelling and erythema. Has gradually been coming on. Is sore to touch. Has been draining clear fluids. She has had a raw like area with surrounding circular changes for 35 years. She has had ulcer on area in past that took a long time to heal. She has an appointment with wound care on June17.  Review of Systems  Constitutional: Negative.   Respiratory: Negative.   Cardiovascular: Negative.   Genitourinary: Negative.   Skin: Positive for wound (left inner lower leg).  All other systems reviewed and are negative.      Objective:   Physical Exam Vitals and nursing note reviewed.  Constitutional:      Appearance: Normal appearance. She is obese.  Cardiovascular:     Rate and Rhythm: Normal rate and regular rhythm.     Pulses: Normal pulses.     Heart sounds: Normal heart sounds.  Pulmonary:     Effort: Pulmonary effort is normal.     Breath sounds: Normal breath sounds.  Skin:    General: Skin is warm.  Neurological:     General: No focal deficit present.     Mental Status: She is alert and oriented to person, place, and time.  Psychiatric:        Mood and Affect: Mood normal.        Behavior: Behavior normal.             Assessment & Plan:  [name] in today with chief complaint of Recheck left leg   1. Cellulitis of left leg without foot Wrap daily Elevate when sitting Finish antibiotic Keep appointment with wound care.  Meds ordered this encounter  Medications  . ciprofloxacin (CIPRO) 500 MG tablet    Sig: Take 1 tablet (500 mg total) by mouth 2 (two) times daily.    Dispense:  20 tablet    Refill:  0    Order Specific Question:   Supervising Provider    Answer:   Caryl Pina A [[id number]]     The above assessment and management plan was discussed with the  patient. The patient verbalized understanding of and has agreed to the management plan. Patient is aware to call the clinic if symptoms persist or worsen. Patient is aware when to return to the clinic for a follow-up visit. Patient educated on when it is appropriate to go to the emergency department.   [name] Hassell Done, FNP

## 2020-07-02 ENCOUNTER — Telehealth: Payer: Self-pay | Admitting: Nurse Practitioner

## 2020-07-02 MED ORDER — DOXYCYCLINE HYCLATE 100 MG PO TABS: 100.0000 mg | ORAL_TABLET | Freq: Two times a day (BID) | ORAL | 0 refills | Status: DC

## 2020-07-02 NOTE — Telephone Encounter (Signed)
Lm for pt about perscription

## 2020-07-12 ENCOUNTER — Other Ambulatory Visit: Payer: Self-pay

## 2020-07-12 ENCOUNTER — Encounter (HOSPITAL_BASED_OUTPATIENT_CLINIC_OR_DEPARTMENT_OTHER): Payer: PPO | Attending: Internal Medicine | Admitting: Internal Medicine

## 2020-07-12 DIAGNOSIS — Z7901 Long term (current) use of anticoagulants: Secondary | ICD-10-CM | POA: Diagnosis not present

## 2020-07-12 DIAGNOSIS — L0889 Other specified local infections of the skin and subcutaneous tissue: Secondary | ICD-10-CM | POA: Diagnosis not present

## 2020-07-12 DIAGNOSIS — D6851 Activated protein C resistance: Secondary | ICD-10-CM | POA: Insufficient documentation

## 2020-07-12 DIAGNOSIS — L97821 Non-pressure chronic ulcer of other part of left lower leg limited to breakdown of skin: Secondary | ICD-10-CM | POA: Insufficient documentation

## 2020-07-12 DIAGNOSIS — E538 Deficiency of other specified B group vitamins: Secondary | ICD-10-CM | POA: Diagnosis not present

## 2020-07-12 DIAGNOSIS — Z86718 Personal history of other venous thrombosis and embolism: Secondary | ICD-10-CM | POA: Diagnosis not present

## 2020-07-12 DIAGNOSIS — L97322 Non-pressure chronic ulcer of left ankle with fat layer exposed: Secondary | ICD-10-CM | POA: Diagnosis not present

## 2020-07-12 DIAGNOSIS — D688 Other specified coagulation defects: Secondary | ICD-10-CM | POA: Insufficient documentation

## 2020-07-12 NOTE — Progress Notes (Signed)
[name], EFAW ([id number]) Visit Report for 07/12/2020 Chief Complaint Document Details Patient Name: Date of Service: [name], CHENNEL OLIVOS 07/12/2020 7:30 A M Medical Record Number: [id number] Patient Account Number: [id number] Date of Birth/Sex: Treating RN: July 13, 1950 (70 y.o. Sue Lush Primary Care Provider: Hassell Done, [name] Other Clinician: Referring Provider: Treating Provider/Extender: Antonietta Breach, [name] Weeks in Treatment: 0 Information Obtained from: Patient Chief Complaint 07/12/17; patient is here for review of a chronic wound on her left medial calf/ankle 07/12/2020. Patient is here for a repeat open area on the left medial lower leg/ankle Electronic Signature(s) Signed: 07/12/2020 5:27:23 PM By: Linton Ham MD Entered By: Linton Ham on 07/12/2020 08:58:13 -------------------------------------------------------------------------------- Debridement Details Patient Name: Date of Service: [name], [name] 07/12/2020 7:30 A M Medical Record Number: [id number] Patient Account Number: [id number] Date of Birth/Sex: Treating RN: 1950/05/26 (70 y.o. Sue Lush Primary Care Provider: Hassell Done, [name] Other Clinician: Referring Provider: Treating Provider/Extender: Antonietta Breach, [name] Weeks in Treatment: 0 Debridement Performed for Assessment: Wound #3 Left,Medial Malleolus Performed By: Physician Ricard Dillon., MD Debridement Type: Debridement Level of Consciousness (Pre-procedure): Awake and Alert Pre-procedure Verification/Time Out Yes - 08:39 Taken: Start Time: 08:40 T Area Debrided (L x W): otal 1 (cm) x 0.5 (cm) = 0.5 (cm) Tissue and other material debrided: Non-Viable, Eschar, Skin: Epidermis Level: Skin/Epidermis Debridement Description: Selective/Open Wound Instrument: Curette Bleeding: Minimum Hemostasis Achieved: Pressure End Time: 08:43 Response to Treatment: Procedure was tolerated  well Level of Consciousness (Post- Awake and Alert procedure): Post Debridement Measurements of Total Wound Length: (cm) 2.5 Width: (cm) 0.5 Depth: (cm) 0.1 Volume: (cm) 0.098 Character of Wound/Ulcer Post Debridement: Stable Post Procedure Diagnosis Same as Pre-procedure Electronic Signature(s) Signed: 07/12/2020 5:27:23 PM By: Linton Ham MD Signed: 07/12/2020 5:38:00 PM By: Lorrin Jackson Entered By: Linton Ham on 07/12/2020 08:57:44 -------------------------------------------------------------------------------- HPI Details Patient Name: Date of Service: [name], [name] 07/12/2020 7:30 A M Medical Record Number: [id number] Patient Account Number: [id number] Date of Birth/Sex: Treating RN: [birth year] (70 y.o. Sue Lush Primary Care Provider: Hassell Done, [name] Other Clinician: Referring Provider: Treating Provider/Extender: Antonietta Breach, [name] Weeks in Treatment: 0 History of Present Illness HPI Description: ADMISSION 07/12/17 This is a 70 year old woman who lives in [place]. She is a nondiabetic nonsmoker but she has factor V Leiden deficiency and a history of DVT in the left leg. She is on chronic anticoagulation with Coumadin more recently switched to Xarelto. 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 she had a wound in 2014 in the same area and was seen in the wound care clinic I think in [place] and the wound closed. Patient is had a history of a wound on the left medial ankle since October 2018. She was seen by her primary physician in January 2009 with the same venous ulcer. Was given [facility]. She was not followed up until May of this year at an urgent care at [facility]. She was felt to have cellulitis and given clindamycin. She was seen in the same urgent care on 07/06/17 also felt to have refractory cellulitis and the clindamycin was extended  although the 2 days later she developed abdominal pain without diarrhea she stop the clindamycin and the pain resolved. She is simply washing this off with soap and water. She has not had venous reflux studies however she has had several ultrasounds of the left leg most recently in  July 2016. This was done due to chronic left lower extremity pain and swelling. The result was that the left femoral vein remains atretic with sequela of prior DVT She has stable chronic deep venous . thrombosis in the left popliteal vein. No new thrombosis is noted. Superficial varicose varicosities noted in the left ankle. Beside the factor V Leiden the patient has a history of a choose one tumor as noted. Hypertension ventricular tachycardia B12 deficiency and anxiety. She has a history of allergy to nylon which she states is precluded wearing compression stockings although she has worn TED hose that have not cause any skin breakdown. She is not currently wearing these. ABI in our clinic was 0.91 on the left 07/16/17; 2 wounds on theleft medial calf/ankle. These are reminiscence once larger wound. The dimensions are smaller. Surface of the wound looks healthy. The patient has no specific complaints of pain or drainage. She is allergic to nylon but handled R3 layer compression with a Kerlix contact layer well. No burning no itching no skin irritation 07/23/17; 2 wounds on the left medial calf/ankle. Patient has no specific complaints to the 3 layer with Kerlix surface that we are using to avoid her nylon allergy. She has no complaints. Wounds are smaller 07/30/17; 2 wounds on the left medial calf/ankle. Larger one is superior. We have been using 3 layer compression with Kerlix as the patient has a nylon allergy. Her wounds are smaller. She is researching stockings without nylon online which will have to be 20-30 mm compression 08/06/17; the inferior wound is fully epithelialized. Superior wound has a small satellite lesion  just superior to it. We've been using silver collagen. We are providing the 3 layer compression. The patient tells me she has compression stockings she is ordered off [place] 08/13/17; both wounds are fully epithelialized. Nice result. The patient has her own compression stockings that are new. She will need to lubricate her skin. READMISSION 07/12/2020 Mrs. Cando is a 70 year old woman now who has chronic venous disease secondary to factor V Leiden deficiency and a history of DVT She is on chronic s. anticoagulation and although my previous notes suggest Eliquis she says she is actually on Xarelto. She is not a diabetic. She tells [place] that in early May she took a trip to [place] with family. Almost as soon as she got out of the car she had inflammation and erythema in the area this did not get any better. She saw her primary doctor and was given ciprofloxacin although she developed some form of reaction to this involving her mouth and lips. She was switched to doxycycline which she is just finishing. She developed an area of breakdown on the left medial lower leg and ankle area. Most of this is already healed. She chronically wears 20/30 below-knee compression stockings although they are old but she did not wear them while she had the infection. Electronic Signature(s) Signed: 07/12/2020 5:27:23 PM By: Linton Ham MD Entered By: Linton Ham on 07/12/2020 09:03:22 -------------------------------------------------------------------------------- Physical Exam Details Patient Name: Date of Service: [name], [name] 07/12/2020 7:30 A M Medical Record Number: [id number] Patient Account Number: [id number] Date of Birth/Sex: Treating RN: May 28, 1950 (70 y.o. Sue Lush Primary Care Provider: Hassell Done, [name] Other Clinician: Referring Provider: Treating Provider/Extender: Antonietta Breach, [name] Weeks in Treatment: 0 Constitutional Sitting or standing  Blood Pressure is within target range for patient.. Pulse regular and within target range for patient.Marland Kitchen Respirations regular, non-labored and within target range.. Temperature is  normal and within the target range for the patient.Marland Kitchen Appears in no distress. Respiratory work of breathing is normal. Cardiovascular Pedal pulses palpable and strong bilaterally.. Chronic venous changes in the left medial lower leg hemosiderin tightly adherent skin. She also has prominent varicosities in the left medial leg. Notes Wound exam; on the medial part of the left lower leg which is in the middle of scar tissue from previous wounds there is a rim around the area of eschar. I remove this with a #3 curette also some nonviable skin. The comma shaped area cleans up quite nicely. I think she probably has too much edema in this area and too much venous hypertension. I see no evidence of surrounding infection at this point Electronic Signature(s) Signed: 07/12/2020 5:27:23 PM By: Linton Ham MD Entered By: Linton Ham on 07/12/2020 09:04:59 -------------------------------------------------------------------------------- Physician Orders Details Patient Name: Date of Service: [name], Donnica L. 07/12/2020 7:30 A M Medical Record Number: [id number] Patient Account Number: [id number] Date of Birth/Sex: Treating RN: [birth year] (70 y.o. Sue Lush Primary Care Provider: Hassell Done, [name] Other Clinician: Referring Provider: Treating Provider/Extender: Antonietta Breach, [name] Weeks in Treatment: 0 Verbal / Phone Orders: No Diagnosis Coding Follow-up Appointments ppointment in 1 week. - with Dr. Dellia Nims Return A Bathing/ Shower/ Hygiene May shower with protection but do not get wound dressing(s) wet. Edema Control - Lymphedema / SCD / Other Elevate legs to the level of the heart or above for 30 minutes daily and/or when sitting, a frequency of: Avoid standing for long periods of  time. Other Edema Control Orders/Instructions: - Obtain a new pair of 20-30 compression stockings Additional Orders / Instructions Follow Nutritious Diet Wound Treatment Wound #3 - Malleolus Wound Laterality: Left, Medial Cleanser: Soap and Water 1 x Per Week/30 Days Discharge Instructions: May shower and wash wound with dial antibacterial soap and water prior to dressing change. Cleanser: Wound Cleanser 1 x Per Week/30 Days Discharge Instructions: Cleanse the wound with wound cleanser prior to applying a clean dressing using gauze sponges, not tissue or cotton balls. Peri-Wound Care: Triamcinolone 15 (g) 1 x Per Week/30 Days Discharge Instructions: T Wound and Periwound o Prim Dressing: KerraCel Ag Gelling Fiber Dressing, 4x5 in (silver alginate) 1 x Per Week/30 Days ary Discharge Instructions: Apply silver alginate to wound bed as instructed Secondary Dressing: Woven Gauze Sponge, Non-Sterile 4x4 in 1 x Per Week/30 Days Discharge Instructions: Apply over primary dressing as directed. Secondary Dressing: ABD Pad, 5x9 1 x Per Week/30 Days Discharge Instructions: Apply over primary dressing as directed. Compression Wrap: ThreePress (3 layer compression wrap) 1 x Per Week/30 Days Discharge Instructions: Apply three layer compression as directed. Electronic Signature(s) Signed: 07/12/2020 5:27:23 PM By: Linton Ham MD Signed: 07/12/2020 5:38:00 PM By: Lorrin Jackson Entered By: Lorrin Jackson on 07/12/2020 08:47:29 -------------------------------------------------------------------------------- Problem List Details Patient Name: Date of Service: [name], [name] 07/12/2020 7:30 A M Medical Record Number: [id number] Patient Account Number: [id number] Date of Birth/Sex: Treating RN: [birth year] (70 y.o. Sue Lush Primary Care Provider: Hassell Done, [name] Other Clinician: Referring Provider: Treating Provider/Extender: Antonietta Breach, [name] Weeks in  Treatment: 0 Active Problems ICD-10 Encounter Code Description Active Date MDM Diagnosis I87.332 Chronic venous hypertension (idiopathic) with ulcer and inflammation of left 07/12/2020 No Yes lower extremity L97.821 Non-pressure chronic ulcer of other part of left lower leg limited to breakdown 07/12/2020 No Yes of skin D68.8 Other specified coagulation defects 07/12/2020 No Yes Inactive Problems Resolved Problems Electronic Signature(s) Signed: 07/12/2020 5:27:23 PM By:  Linton Ham MD Entered By: Linton Ham on 07/12/2020 08:57:22 -------------------------------------------------------------------------------- Progress Note Details Patient Name: Date of Service: [name], Jalena L. 07/12/2020 7:30 A M Medical Record Number: [id number] Patient Account Number: [id number] Date of Birth/Sex: Treating RN: Apr 11, 1950 (70 y.o. Sue Lush Primary Care Provider: Hassell Done, [name] Other Clinician: Referring Provider: Treating Provider/Extender: Antonietta Breach, [name] Weeks in Treatment: 0 Subjective Chief Complaint Information obtained from Patient 07/12/17; patient is here for review of a chronic wound on her left medial calf/ankle 07/12/2020. Patient is here for a repeat open area on the left medial lower leg/ankle History of Present Illness (HPI) ADMISSION 07/12/17 This is a 70 year old woman who lives in [place]. She is a nondiabetic nonsmoker but she has factor V Leiden deficiency and a history of DVT in the left leg. She is on chronic anticoagulation with Coumadin more recently switched to Xarelto. 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 she had a wound in 2014 in the same area and was seen in the wound care clinic I think in [place] and the wound closed. Patient is had a history of a wound on the left medial ankle since October 2018. She was seen by her primary  physician in January 2009 with the same venous ulcer. Was given [facility]. She was not followed up until May of this year at an urgent care at [facility]. She was felt to have cellulitis and given clindamycin. She was seen in the same urgent care on 07/06/17 also felt to have refractory cellulitis and the clindamycin was extended although the 2 days later she developed abdominal pain without diarrhea she stop the clindamycin and the pain resolved. She is simply washing this off with soap and water. She has not had venous reflux studies however she has had several ultrasounds of the left leg most recently in July 2016. This was done due to chronic left lower extremity pain and swelling. The result was that the left femoral vein remains atretic with sequela of prior DVT She has stable chronic deep venous . thrombosis in the left popliteal vein. No new thrombosis is noted. Superficial varicose varicosities noted in the left ankle. Beside the factor V Leiden the patient has a history of a choose one tumor as noted. Hypertension ventricular tachycardia B12 deficiency and anxiety. She has a history of allergy to nylon which she states is precluded wearing compression stockings although she has worn TED hose that have not cause any skin breakdown. She is not currently wearing these. ABI in our clinic was 0.91 on the left 07/16/17; 2 wounds on theleft medial calf/ankle. These are reminiscence once larger wound. The dimensions are smaller. Surface of the wound looks healthy. The patient has no specific complaints of pain or drainage. She is allergic to nylon but handled R3 layer compression with a Kerlix contact layer well. No burning no itching no skin irritation 07/23/17; 2 wounds on the left medial calf/ankle. Patient has no specific complaints to the 3 layer with Kerlix surface that we are using to avoid her nylon allergy. She has no complaints. Wounds are smaller 07/30/17; 2 wounds on the left medial calf/ankle.  Larger one is superior. We have been using 3 layer compression with Kerlix as the patient has a nylon allergy. Her wounds are smaller. She is researching stockings without nylon online which will have to be 20-30 mm compression 08/06/17; the inferior wound is  fully epithelialized. Superior wound has a small satellite lesion just superior to it. We've been using silver collagen. We are providing the 3 layer compression. The patient tells me she has compression stockings she is ordered off [place] 08/13/17; both wounds are fully epithelialized. Nice result. The patient has her own compression stockings that are new. She will need to lubricate her skin. READMISSION 07/12/2020 Mrs. Havrilla is a 70 year old woman now who has chronic venous disease secondary to factor V Leiden deficiency and a history of DVT She is on chronic s. anticoagulation and although my previous notes suggest Eliquis she says she is actually on Xarelto. She is not a diabetic. She tells [place] that in early May she took a trip to [place] with family. Almost as soon as she got out of the car she had inflammation and erythema in the area this did not get any better. She saw her primary doctor and was given ciprofloxacin although she developed some form of reaction to this involving her mouth and lips. She was switched to doxycycline which she is just finishing. She developed an area of breakdown on the left medial lower leg and ankle area. Most of this is already healed. She chronically wears 20/30 below-knee compression stockings although they are old but she did not wear them while she had the infection. Patient History Information obtained from Patient. Allergies penicillin (Severity: Moderate, Reaction: itching), cephalexin (Severity: Moderate, Reaction: itching), Sulfa (Sulfonamide Antibiotics), Cipro (Reaction: mouth sores, itching), bee venom protein (honey bee) (Reaction: swelling, rash), dexlansoprazole (Reaction:  swelling), imatinib (Reaction: intolerance), pecan nut (Reaction: mouth swelling), latex (Reaction: rash), nylon (Reaction: rach), adhesive tape (Reaction: itching) Family History Heart Disease - Paternal Grandparents, Hypertension - Mother,Maternal Grandparents, Thyroid Problems - Siblings,Child, No family history of Cancer, Diabetes, Hereditary Spherocytosis, Kidney Disease, Lung Disease, Seizures, Stroke, Tuberculosis. Social History Never smoker, Marital Status - Married, Alcohol Use - Never, Drug Use - No History, Caffeine Use - Daily - coffee. Medical History Eyes Denies history of Cataracts, Glaucoma, Optic Neuritis Ear/Nose/Mouth/Throat Denies history of Chronic sinus problems/congestion, Middle ear problems Hematologic/Lymphatic Denies history of Anemia, Hemophilia, Human Immunodeficiency Virus, Lymphedema, Sickle Cell Disease Respiratory Denies history of Aspiration, Asthma, Chronic Obstructive Pulmonary Disease (COPD), Pneumothorax, Sleep Apnea, Tuberculosis Cardiovascular Patient has history of Arrhythmia - due to chemo medications, Deep Vein Thrombosis - 1987, Hypertension - 10 years, meteporal Denies history of Congestive Heart Failure, Coronary Artery Disease, Hypotension, Myocardial Infarction, Peripheral Arterial Disease, Peripheral Venous Disease, Phlebitis, Vasculitis Endocrine Denies history of Type I Diabetes, Type II Diabetes Genitourinary Denies history of End Stage Renal Disease Immunological Denies history of Lupus Erythematosus, Raynaudoos, Scleroderma Integumentary (Skin) Denies history of History of Burn Musculoskeletal Denies history of Gout, Rheumatoid Arthritis, Osteoarthritis, Osteomyelitis Neurologic Denies history of Dementia, Neuropathy, Quadriplegia, Paraplegia, Seizure Disorder Oncologic Patient has history of Received Chemotherapy - oral meds Denies history of Received Radiation Psychiatric Patient has history of Confinement Anxiety Denies  history of Anorexia/bulimia Hospitalization/Surgery History - small bowel resection. - abdominal hysterectomy. - ORIF right wrist. - laparotomy. Medical A Surgical History Notes nd Constitutional Symptoms (General Health) obese Hematologic/Lymphatic Factor 5 ( clotting issue, taking xarelto) Oncologic colon cancer, removed portion of large colon Review of Systems (ROS) Constitutional Symptoms (General Health) Denies complaints or symptoms of Fatigue, Fever, Chills, Marked Weight Change. Eyes Complains or has symptoms of Glasses / Contacts. Denies complaints or symptoms of Dry Eyes, Vision Changes. Ear/Nose/Mouth/Throat Denies complaints or symptoms of Chronic sinus problems or rhinitis. Respiratory Denies complaints  or symptoms of Chronic or frequent coughs, Shortness of Breath. Cardiovascular Denies complaints or symptoms of Chest pain. Gastrointestinal Denies complaints or symptoms of Frequent diarrhea, Nausea, Vomiting. Endocrine Denies complaints or symptoms of Heat/cold intolerance. Genitourinary Denies complaints or symptoms of Frequent urination. Integumentary (Skin) Complains or has symptoms of Wounds - left ankle. Musculoskeletal Denies complaints or symptoms of Muscle Pain, Muscle Weakness. Neurologic Denies complaints or symptoms of Numbness/parasthesias. Psychiatric Complains or has symptoms of Claustrophobia. Denies complaints or symptoms of Suicidal. Objective Constitutional Sitting or standing Blood Pressure is within target range for patient.. Pulse regular and within target range for patient.Marland Kitchen Respirations regular, non-labored and within target range.. Temperature is normal and within the target range for the patient.Marland Kitchen Appears in no distress. Vitals Time Taken: 7:51 AM, Height: 67 in, Source: Stated, Weight: 244 lbs, Source: Stated, BMI: 38.2, Temperature: 98.1 F, Pulse: 71 bpm, Respiratory Rate: 18 breaths/min, Blood Pressure: 144/81  mmHg. Respiratory work of breathing is normal. Cardiovascular Pedal pulses palpable and strong bilaterally.. Chronic venous changes in the left medial lower leg hemosiderin tightly adherent skin. She also has prominent varicosities in the left medial leg. General Notes: Wound exam; on the medial part of the left lower leg which is in the middle of scar tissue from previous wounds there is a rim around the area of eschar. I remove this with a #3 curette also some nonviable skin. The comma shaped area cleans up quite nicely. I think she probably has too much edema in this area and too much venous hypertension. I see no evidence of surrounding infection at this point Integumentary (Hair, Skin) Wound #3 status is Open. Original cause of wound was Gradually Appeared. The date acquired was: 05/27/2020. The wound is located on the Left,Medial Malleolus. The wound measures 2.5cm length x 0.5cm width x 0.1cm depth; 0.982cm^2 area and 0.098cm^3 volume. There is Fat Layer (Subcutaneous Tissue) exposed. There is no tunneling or undermining noted. There is a small amount of serous drainage noted. The wound margin is flat and intact. There is medium (34-66%) pink granulation within the wound bed. There is a medium (34-66%) amount of necrotic tissue within the wound bed including Adherent Slough. Assessment Active Problems ICD-10 Chronic venous hypertension (idiopathic) with ulcer and inflammation of left lower extremity Non-pressure chronic ulcer of other part of left lower leg limited to breakdown of skin Other specified coagulation defects Procedures Wound #3 Pre-procedure diagnosis of Wound #3 is an Infection - not elsewhere classified located on the Left,Medial Malleolus . There was a Selective/Open Wound Skin/Epidermis Debridement with a total area of 0.5 sq cm performed by Ricard Dillon., MD. With the following instrument(s): Curette to remove Non- Viable tissue/material. Material removed includes  Eschar and Skin: Epidermis and. No specimens were taken. A time out was conducted at 08:39, prior to the start of the procedure. A Minimum amount of bleeding was controlled with Pressure. The procedure was tolerated well. Post Debridement Measurements: 2.5cm length x 0.5cm width x 0.1cm depth; 0.098cm^3 volume. Character of Wound/Ulcer Post Debridement is stable. Post procedure Diagnosis Wound #3: Same as Pre-Procedure Pre-procedure diagnosis of Wound #3 is an Infection - not elsewhere classified located on the Left,Medial Malleolus . There was a Three Layer Compression Therapy Procedure by Lorrin Jackson, RN. Post procedure Diagnosis Wound #3: Same as Pre-Procedure Plan Follow-up Appointments: Return Appointment in 1 week. - with Dr. Arcola Jansky Shower/ Hygiene: May shower with protection but do not get wound dressing(s) wet. Edema Control - Lymphedema / SCD /  Other: Elevate legs to the level of the heart or above for 30 minutes daily and/or when sitting, a frequency of: Avoid standing for long periods of time. Other Edema Control Orders/Instructions: - Obtain a new pair of 20-30 compression stockings Additional Orders / Instructions: Follow Nutritious Diet WOUND #3: - Malleolus Wound Laterality: Left, Medial Cleanser: Soap and Water 1 x Per Week/30 Days Discharge Instructions: May shower and wash wound with dial antibacterial soap and water prior to dressing change. Cleanser: Wound Cleanser 1 x Per Week/30 Days Discharge Instructions: Cleanse the wound with wound cleanser prior to applying a clean dressing using gauze sponges, not tissue or cotton balls. Peri-Wound Care: Triamcinolone 15 (g) 1 x Per Week/30 Days Discharge Instructions: T Wound and Periwound o Prim Dressing: KerraCel Ag Gelling Fiber Dressing, 4x5 in (silver alginate) 1 x Per Week/30 Days ary Discharge Instructions: Apply silver alginate to wound bed as instructed Secondary Dressing: Woven Gauze Sponge, Non-Sterile  4x4 in 1 x Per Week/30 Days Discharge Instructions: Apply over primary dressing as directed. Secondary Dressing: ABD Pad, 5x9 1 x Per Week/30 Days Discharge Instructions: Apply over primary dressing as directed. Com pression Wrap: ThreePress (3 layer compression wrap) 1 x Per Week/30 Days Discharge Instructions: Apply three layer compression as directed. 1. I am going to use silver alginate on this area with TCA on the surrounding skin and wound 2. ABDs and 3 layer compression 3. I am not certain that she is outlived her 20/30 stockings although she is certainly going to need new ones. The 1 she is using are actually from 2019 when she was here the last time. I am thinking that the trip to [place] probably overwhelmed the stockings. At some point in time I think this patient is going to need more aggressive compression but that may not be now. 4. I see no evidence of infection no additional antibiotics. She just completed doxycycline yesterday 5. Although she has venous obstruction in the thigh including the popliteal and femoral vein. I did not see any evidence to suggest you to have more central venous obstruction I did not order any additional tests I spent 35 minutes in review of this patient's past medical history, face-to-face evaluation and preparation of this record Electronic Signature(s) Signed: 07/12/2020 5:27:23 PM By: Linton Ham MD Entered By: Linton Ham on 07/12/2020 09:07:14 -------------------------------------------------------------------------------- HxROS Details Patient Name: Date of Service: [name], [name] 07/12/2020 7:30 A M Medical Record Number: [id number] Patient Account Number: [id number] Date of Birth/Sex: Treating RN: 06-30-1950 (70 y.o. [name] Primary Care Provider: Hassell Done, [name] Other Clinician: Referring Provider: Treating Provider/Extender: Antonietta Breach, [name] Weeks in Treatment: 0 Information Obtained  From Patient Constitutional Symptoms (General Health) Complaints and Symptoms: Negative for: Fatigue; Fever; Chills; Marked Weight Change Medical History: Past Medical History Notes: obese Eyes Complaints and Symptoms: Positive for: Glasses / Contacts Negative for: Dry Eyes; Vision Changes Medical History: Negative for: Cataracts; Glaucoma; Optic Neuritis Ear/Nose/Mouth/Throat Complaints and Symptoms: Negative for: Chronic sinus problems or rhinitis Medical History: Negative for: Chronic sinus problems/congestion; Middle ear problems Respiratory Complaints and Symptoms: Negative for: Chronic or frequent coughs; Shortness of Breath Medical History: Negative for: Aspiration; Asthma; Chronic Obstructive Pulmonary Disease (COPD); Pneumothorax; Sleep Apnea; Tuberculosis Cardiovascular Complaints and Symptoms: Negative for: Chest pain Medical History: Positive for: Arrhythmia - due to chemo medications; Deep Vein Thrombosis - 1987; Hypertension - 10 years, meteporal Negative for: Congestive Heart Failure; Coronary Artery Disease; Hypotension; Myocardial Infarction; Peripheral Arterial Disease; Peripheral Venous Disease; Phlebitis;  Vasculitis Gastrointestinal Complaints and Symptoms: Negative for: Frequent diarrhea; Nausea; Vomiting Endocrine Complaints and Symptoms: Negative for: Heat/cold intolerance Medical History: Negative for: Type I Diabetes; Type II Diabetes Genitourinary Complaints and Symptoms: Negative for: Frequent urination Medical History: Negative for: End Stage Renal Disease Integumentary (Skin) Complaints and Symptoms: Positive for: Wounds - left ankle Medical History: Negative for: History of Burn Musculoskeletal Complaints and Symptoms: Negative for: Muscle Pain; Muscle Weakness Medical History: Negative for: Gout; Rheumatoid Arthritis; Osteoarthritis; Osteomyelitis Neurologic Complaints and Symptoms: Negative for: Numbness/parasthesias Medical  History: Negative for: Dementia; Neuropathy; Quadriplegia; Paraplegia; Seizure Disorder Psychiatric Complaints and Symptoms: Positive for: Claustrophobia Negative for: Suicidal Medical History: Positive for: Confinement Anxiety Negative for: Anorexia/bulimia Hematologic/Lymphatic Medical History: Negative for: Anemia; Hemophilia; Human Immunodeficiency Virus; Lymphedema; Sickle Cell Disease Past Medical History Notes: Factor 5 ( clotting issue, taking xarelto) Immunological Medical History: Negative for: Lupus Erythematosus; Raynauds; Scleroderma Oncologic Medical History: Positive for: Received Chemotherapy - oral meds Negative for: Received Radiation Past Medical History Notes: colon cancer, removed portion of large colon Immunizations Pneumococcal Vaccine: Received Pneumococcal Vaccination: No Tetanus Vaccine: Last tetanus shot: 01/26/2014 Implantable Devices No devices added Hospitalization / Surgery History Type of Hospitalization/Surgery small bowel resection abdominal hysterectomy ORIF right wrist laparotomy Family and Social History Cancer: No; Diabetes: No; Heart Disease: Yes - Paternal Grandparents; Hereditary Spherocytosis: No; Hypertension: Yes - Mother,Maternal Grandparents; Kidney Disease: No; Lung Disease: No; Seizures: No; Stroke: No; Thyroid Problems: Yes - Siblings,Child; Tuberculosis: No; Never smoker; Marital Status - Married; Alcohol Use: Never; Drug Use: No History; Caffeine Use: Daily - coffee; Financial Concerns: No; Food, Clothing or Shelter Needs: No; Support System Lacking: No; Transportation Concerns: No Electronic Signature(s) Signed: 07/12/2020 5:27:23 PM By: Linton Ham MD Signed: 07/12/2020 5:33:29 PM By: Baruch Gouty RN, BSN Entered By: Baruch Gouty on 07/12/2020 07:59:21 -------------------------------------------------------------------------------- George Details Patient Name: Date of Service: [name], [name]  07/12/2020 Medical Record Number: [id number] Patient Account Number: [id number] Date of Birth/Sex: Treating RN: 12-06-1950 (70 y.o. Sue Lush Primary Care Provider: Hassell Done, [name] Other Clinician: Referring Provider: Treating Provider/Extender: Antonietta Breach, [name] Weeks in Treatment: 0 Diagnosis Coding ICD-10 Codes Code Description [phone] Chronic venous hypertension (idiopathic) with ulcer and inflammation of left lower extremity L97.821 Non-pressure chronic ulcer of other part of left lower leg limited to breakdown of skin D68.8 Other specified coagulation defects Facility Procedures CPT4 Code: [id number] Description: 58850 - WOUND CARE VISIT-LEV 3 EST PT Modifier: 25 Quantity: 1 CPT4 Code: [id number] Description: 86767 - DEBRIDE WOUND 1ST 20 SQ CM OR < ICD-10 Diagnosis Description L97.821 Non-pressure chronic ulcer of other part of left lower leg limited to breakdown o Modifier: f skin Quantity: 1 Physician Procedures : CPT4 Code Description Modifier [id number] 99214 - WC PHYS LEVEL 4 - EST PT ICD-10 Diagnosis Description I87.332 Chronic venous hypertension (idiopathic) with ulcer and inflammation of left lower extremity L97.821 Non-pressure chronic ulcer of other part  of left lower leg limited to breakdown of skin D68.8 Other specified coagulation defects Quantity: 1 : [id number] 97597 - WC PHYS DEBR WO ANESTH 20 SQ CM ICD-10 Diagnosis Description L97.821 Non-pressure chronic ulcer of other part of left lower leg limited to breakdown of skin Quantity: 1 Electronic Signature(s) Signed: 07/12/2020 12:27:41 PM By: Lorrin Jackson Signed: 07/12/2020 5:27:23 PM By: Linton Ham MD Entered By: Lorrin Jackson on 07/12/2020 12:27:41

## 2020-07-12 NOTE — Progress Notes (Signed)
[name], [name] ([id number]) Visit Report for 07/12/2020 Abuse/Suicide Risk Screen Details Patient Name: Date of Service: NA [name] 07/12/2020 7:30 A M Medical Record Number: [id number] Patient Account Number: [id number] Date of Birth/Sex: Treating RN: Apr 07, 1950 (70 y.o. [name] Primary Care Devlon Dosher: Hassell Done, [name] Other Clinician: Referring Johana Hopkinson: Treating Dustine Stickler/Extender: Antonietta Breach, [name] Weeks in Treatment: 0 Abuse/Suicide Risk Screen Items Answer ABUSE RISK SCREEN: Has anyone close to you tried to hurt or harm you recentlyo No Do you feel uncomfortable with anyone in your familyo No Has anyone forced you do things that you didnt want to doo No Electronic Signature(s) Signed: 07/12/2020 5:33:29 PM By: Baruch Gouty RN, BSN Entered By: Baruch Gouty on 07/12/2020 07:59:34 -------------------------------------------------------------------------------- Activities of Daily Living Details Patient Name: Date of Service: [name], [name] 07/12/2020 7:30 A M Medical Record Number: [id number] Patient Account Number: [id number] Date of Birth/Sex: Treating RN: Jan 30, 1950 (70 y.o. [name] Primary Care Chaunice Obie: Hassell Done, [name] Other Clinician: Referring Avarose Mervine: Treating Jahquan Klugh/Extender: Antonietta Breach, [name] Weeks in Treatment: 0 Activities of Daily Living Items Answer Activities of Daily Living (Please select one for each item) Drive Automobile Completely Able T Medications ake Completely Able Use T elephone Completely Able Care for Appearance Completely Able Use T oilet Completely Able Bath / Shower Completely Able Dress Self Completely Able Feed Self Completely Able Walk Completely Able Get In / Out Bed Completely Able Housework Completely Able Prepare Meals Completely Able Handle Money Completely Able Shop for Self Completely Able Electronic Signature(s) Signed: 07/12/2020  5:33:29 PM By: Baruch Gouty RN, BSN Entered By: Baruch Gouty on 07/12/2020 07:59:53 -------------------------------------------------------------------------------- Education Screening Details Patient Name: Date of Service: [name], [name] 07/12/2020 7:30 A M Medical Record Number: [id number] Patient Account Number: [id number] Date of Birth/Sex: Treating RN: 1951-01-11 (70 y.o. [name] Primary Care Jeryn Bertoni: Hassell Done, [name] Other Clinician: Referring Katalina Magri: Treating Joselinne Lawal/Extender: Antonietta Breach, [name] Weeks in Treatment: 0 Primary Learner Assessed: Patient Learning Preferences/Education Level/Primary Language Learning Preference: Explanation, Demonstration, Printed Material Highest Education Level: High School Preferred Language: English Cognitive Barrier Language Barrier: No Translator Needed: No Memory Deficit: No Emotional Barrier: No Cultural/Religious Beliefs Affecting Medical Care: No Physical Barrier Impaired Vision: Yes Glasses Impaired Hearing: No Decreased Hand dexterity: No Knowledge/Comprehension Knowledge Level: High Comprehension Level: High Ability to understand written instructions: High Ability to understand verbal instructions: High Motivation Anxiety Level: Calm Cooperation: Cooperative Education Importance: Acknowledges Need Interest in Health Problems: Asks Questions Perception: Coherent Willingness to Engage in Self-Management High Activities: Readiness to Engage in Self-Management High Activities: Electronic Signature(s) Signed: 07/12/2020 5:33:29 PM By: Baruch Gouty RN, BSN Entered By: Baruch Gouty on 07/12/2020 08:00:25 -------------------------------------------------------------------------------- Fall Risk Assessment Details Patient Name: Date of Service: [name], [name] 07/12/2020 7:30 A M Medical Record Number: [id number] Patient Account Number: [id number] Date of  Birth/Sex: Treating RN: 01/08/1951 (70 y.o. [name] Primary Care Kriston Mckinnie: Hassell Done, [name] Other Clinician: Referring [name], [name] Weeks in Treatment: 0 Fall Risk Assessment Items Have you had 2 or more falls in the last 12 monthso 0 No Have you had any fall that resulted in injury in the last 12 monthso 0 No FALLS RISK SCREEN History of falling - immediate or within 3 months 0 No Secondary diagnosis (Do you have 2 or more medical diagnoseso) 0 No Ambulatory aid None/bed rest/wheelchair/nurse 0 Yes Crutches/cane/walker 0 No Furniture 0 No Intravenous therapy Access/Saline/Heparin Lock 0 No Gait/Transferring Normal/ bed rest/ wheelchair 0  Yes Weak (short steps with or without shuffle, stooped but able to lift head while walking, may seek 0 No support from furniture) Impaired (short steps with shuffle, may have difficulty arising from chair, head down, impaired 0 No balance) Mental Status Oriented to own ability 0 Yes Electronic Signature(s) Signed: 07/12/2020 5:33:29 PM By: Baruch Gouty RN, BSN Entered By: Baruch Gouty on 07/12/2020 08:00:53 -------------------------------------------------------------------------------- Foot Assessment Details Patient Name: Date of Service: [name], [name] 07/12/2020 7:30 A M Medical Record Number: [id number] Patient Account Number: [id number] Date of Birth/Sex: Treating RN: [birth year] (70 y.o. [name] Primary Care Nehemyah Foushee: Hassell Done, [name] Other Clinician: Referring [name], [name] Weeks in Treatment: 0 Foot Assessment Items Site Locations + = Sensation present, - = Sensation absent, C = Callus, U = Ulcer R = Redness, W = Warmth, M = Maceration, PU = Pre-ulcerative lesion F = Fissure, S = Swelling, D = Dryness Assessment Right: Left: Other Deformity: No No Prior Foot  Ulcer: No No Prior Amputation: No No Charcot Joint: No No Ambulatory Status: Ambulatory Without Help Gait: Steady Electronic Signature(s) Signed: 07/12/2020 5:33:29 PM By: Baruch Gouty RN, BSN Entered By: Baruch Gouty on 07/12/2020 08:01:55 -------------------------------------------------------------------------------- Nutrition Risk Screening Details Patient Name: Date of Service: [name], [name] 07/12/2020 7:30 A M Medical Record Number: [id number] Patient Account Number: [id number] Date of Birth/Sex: Treating RN: 1950-11-01 (70 y.o. [name] Primary Care Mitul Hallowell: Hassell Done, [name] Other Clinician: Referring Johnn Krasowski: Treating Misbah Hornaday/Extender: Antonietta Breach, [name] Weeks in Treatment: 0 Height (in): 67 Weight (lbs): 244 Body Mass Index (BMI): 38.2 Nutrition Risk Screening Items Score Screening NUTRITION RISK SCREEN: I have an illness or condition that made me change the kind and/or amount of food I eat 0 No I eat fewer than two meals per day 0 No I eat few fruits and vegetables, or milk products 0 No I have three or more drinks of beer, liquor or wine almost every day 0 No I have tooth or mouth problems that make it hard for me to eat 0 No I don't always have enough money to buy the food I need 0 No I eat alone most of the time 0 No I take three or more different prescribed or over-the-counter drugs a day 1 Yes Without wanting to, I have lost or gained 10 pounds in the last six months 0 No I am not always physically able to shop, cook and/or feed myself 0 No Nutrition Protocols Good Risk Protocol 0 No interventions needed Moderate Risk Protocol High Risk Proctocol Risk Level: Good Risk Score: 1 Electronic Signature(s) Signed: 07/12/2020 5:33:29 PM By: Baruch Gouty RN, BSN Entered By: Baruch Gouty on 07/12/2020 08:01:30

## 2020-07-12 NOTE — Progress Notes (Addendum)
NATE, PERRI ([id number]) Visit Report for 07/12/2020 Allergy List Details Patient Name: Date of Service: [name], [name] 07/12/2020 7:30 A M Medical Record Number: [id number] Patient Account Number: [id number] Date of Birth/Sex: Treating RN: [birth year] (70 y.o. Martyn Malay, Linda Primary Care Tomekia Helton: Hassell Done, [name] Other Clinician: Referring Keondra Haydu: Treating Geordan Xu/Extender: Antonietta Breach, [name] Weeks in Treatment: 0 Allergies Active Allergies penicillin Reaction: itching Severity: Moderate cephalexin Reaction: itching Severity: Moderate Sulfa (Sulfonamide Antibiotics) Cipro Reaction: mouth sores, itching bee venom protein (honey bee) Reaction: swelling, rash dexlansoprazole Reaction: swelling imatinib Reaction: intolerance pecan nut Reaction: mouth swelling latex Reaction: rash nylon Reaction: rach adhesive tape Reaction: itching Allergy Notes Electronic Signature(s) Signed: 07/12/2020 5:33:29 PM By: Baruch Gouty RN, BSN Entered By: Baruch Gouty on 07/12/2020 08:16:10 -------------------------------------------------------------------------------- Arrival Information Details Patient Name: Date of Service: [name], [name] 07/12/2020 7:30 A M Medical Record Number: [id number] Patient Account Number: [id number] Date of Birth/Sex: Treating RN: 1950-09-07 (70 y.o. Martyn Malay, Linda Primary Care Adelis Docter: Other Clinician: Hassell Done, [name] Referring Mccartney Chuba: Treating Zaide Kardell/Extender: Antonietta Breach, [name] Weeks in Treatment: 0 Visit Information Patient Arrived: Ambulatory Arrival Time: 07:47 Accompanied By: self Transfer Assistance: None Patient Identification Verified: Yes Secondary Verification Process Completed: Yes Patient Requires Transmission-Based Precautions: No Patient Has Alerts: Yes Patient Alerts: Patient on Blood Thinner History Since Last Visit Added or deleted any medications:  No Any new allergies or adverse reactions: No Had a fall or experienced change in activities of daily living that may affect risk of falls: No Signs or symptoms of abuse/neglect since last visito No Hospitalized since last visit: No Implantable device outside of the clinic excluding cellular tissue based products placed in the center since last visit: No Has Dressing in Place as Prescribed: Yes Electronic Signature(s) Signed: 07/12/2020 5:33:29 PM By: Baruch Gouty RN, BSN Entered By: Baruch Gouty on 07/12/2020 08:24:52 -------------------------------------------------------------------------------- Clinic Level of Care Assessment Details Patient Name: Date of Service: [name], [name] 07/12/2020 7:30 A M Medical Record Number: [id number] Patient Account Number: [id number] Date of Birth/Sex: Treating RN: 1950-07-23 (70 y.o. Sue Lush Primary Care Jahmir Salo: Hassell Done, [name] Other Clinician: Referring Cecillia Menees: Treating Billye Nydam/Extender: Antonietta Breach, [name] Weeks in Treatment: 0 Clinic Level of Care Assessment Items TOOL 1 Quantity Score X- 1 0 Use when EandM and Procedure is performed on INITIAL visit ASSESSMENTS - Nursing Assessment / Reassessment X- 1 20 General Physical Exam (combine w/ comprehensive assessment (listed just below) when performed on new pt. evals) X- 1 25 Comprehensive Assessment (HX, ROS, Risk Assessments, Wounds Hx, etc.) ASSESSMENTS - Wound and Skin Assessment / Reassessment []  - 0 Dermatologic / Skin Assessment (not related to wound area) ASSESSMENTS - Ostomy and/or Continence Assessment and Care []  - 0 Incontinence Assessment and Management []  - 0 Ostomy Care Assessment and Management (repouching, etc.) PROCESS - Coordination of Care []  - 0 Simple Patient / Family Education for ongoing care X- 1 20 Complex (extensive) Patient / Family Education for ongoing care X- 1 10 Staff obtains Programmer, systems, Records, T Results /  Process Orders est X- 1 10 Staff telephones HHA, Nursing Homes / Clarify orders / etc []  - 0 Routine Transfer to another Facility (non-emergent condition) []  - 0 Routine Hospital Admission (non-emergent condition) []  - 0 New Admissions / Biomedical engineer / Ordering NPWT Apligraf, etc. , []  - 0 Emergency Hospital Admission (emergent condition) PROCESS - Special Needs []  - 0 Pediatric / Minor Patient Management []  - 0 Isolation Patient Management []  - 0 Hearing /  Language / Visual special needs []  - 0 Assessment of Community assistance (transportation, D/C planning, etc.) []  - 0 Additional assistance / Altered mentation []  - 0 Support Surface(s) Assessment (bed, cushion, seat, etc.) INTERVENTIONS - Miscellaneous []  - 0 External ear exam []  - 0 Patient Transfer (multiple staff / Civil Service fast streamer / Similar devices) []  - 0 Simple Staple / Suture removal (25 or less) []  - 0 Complex Staple / Suture removal (26 or more) []  - 0 Hypo/Hyperglycemic Management (do not check if billed separately) X- 1 15 Ankle / Brachial Index (ABI) - do not check if billed separately Has the patient been seen at the hospital within the last three years: Yes Total Score: 100 Level Of Care: New/Established - Level 3 Electronic Signature(s) Signed: 07/12/2020 5:38:00 PM By: Lorrin Jackson Entered By: Lorrin Jackson on 07/12/2020 08:47:59 -------------------------------------------------------------------------------- Compression Therapy Details Patient Name: Date of Service: [name], [name] 07/12/2020 7:30 A M Medical Record Number: [id number] Patient Account Number: [id number] Date of Birth/Sex: Treating RN: [birth year] (70 y.o. Sue Lush Primary Care Tywanna Seifer: Hassell Done, [name] Other Clinician: Referring Lowell Mcgurk: Treating Jaquelyne Firkus/Extender: Antonietta Breach, [name] Weeks in Treatment: 0 Compression Therapy Performed for Wound Assessment: Wound #3 Left,Medial  Malleolus Performed By: Clinician Lorrin Jackson, RN Compression Type: Three Layer Post Procedure Diagnosis Same as Pre-procedure Electronic Signature(s) Signed: 07/12/2020 5:38:00 PM By: Lorrin Jackson Entered By: Lorrin Jackson on 07/12/2020 08:45:13 -------------------------------------------------------------------------------- Encounter Discharge Information Details Patient Name: Date of Service: [name], [name] 07/12/2020 7:30 A M Medical Record Number: [id number] Patient Account Number: [id number] Date of Birth/Sex: Treating RN: [birth year] (70 y.o. Helene Shoe, Tammi Klippel Primary Care Nitasha Jewel: Hassell Done, [name] Other Clinician: Referring Kratos Ruscitti: Treating Mikaiya Tramble/Extender: Antonietta Breach, [name] Weeks in Treatment: 0 Encounter Discharge Information Items Post Procedure Vitals Discharge Condition: Stable Temperature (F): 98.1 Ambulatory Status: Ambulatory Pulse (bpm): 71 Discharge Destination: Home Respiratory Rate (breaths/min): 18 Transportation: Private Auto Blood Pressure (mmHg): 144/81 Accompanied By: self Schedule Follow-up Appointment: Yes Clinical Summary of Care: Electronic Signature(s) Signed: 07/12/2020 4:58:53 PM By: Deon Pilling Entered By: Deon Pilling on 07/12/2020 09:16:03 -------------------------------------------------------------------------------- Lower Extremity Assessment Details Patient Name: Date of Service: [name], [name] 07/12/2020 7:30 A M Medical Record Number: [id number] Patient Account Number: [id number] Date of Birth/Sex: Treating RN: 1950-08-19 (70 y.o. [name] Primary Care Jemmie Rhinehart: Hassell Done, [name] Other Clinician: Referring Falesha Schommer: Treating Shourya Macpherson/Extender: Antonietta Breach, [name] Weeks in Treatment: 0 Edema Assessment Assessed: [Left: No] [Right: No] Edema: [Left: N] [Right: o] Calf Left: Right: Point of Measurement: From Medial Instep 36.5 cm Ankle Left: Right: Point  of Measurement: From Medial Instep 21 cm Knee To Floor Left: Right: From Medial Instep 41 cm Vascular Assessment Pulses: Dorsalis Pedis Palpable: [Left:Yes] Blood Pressure: Brachial: [Left:144] Dorsalis Pedis: 168 Ankle: Posterior Tibial: 178 Ankle Brachial Index: [Left:1.24] Electronic Signature(s) Signed: 07/12/2020 5:33:29 PM By: Baruch Gouty RN, BSN Entered By: Baruch Gouty on 07/12/2020 08:07:07 -------------------------------------------------------------------------------- Multi Wound Chart Details Patient Name: Date of Service: [name], Denise Ion. 07/12/2020 7:30 A M Medical Record Number: [id number] Patient Account Number: [id number] Date of Birth/Sex: Treating RN: [birth year] (70 y.o. Sue Lush Primary Care Shaden Higley: Hassell Done, [name] Other Clinician: Referring Angello Chien: Treating Zeniya Lapidus/Extender: Antonietta Breach, [name] Weeks in Treatment: 0 Vital Signs Height(in): 67 Pulse(bpm): 71 Weight(lbs): 244 Blood Pressure(mmHg): 144/81 Body Mass Index(BMI): 38 Temperature(F): 98.1 Respiratory Rate(breaths/min): 18 Photos: [3:No Photos Left, Medial Malleolus] [N/A:N/A N/A] Wound Location: [3:Gradually Appeared] [N/A:N/A] Wounding Event: [3:Infection - not elsewhere classified N/A] Primary Etiology: [3:Arrhythmia, Deep  Vein Thrombosis, N/A] Comorbid History: [3:Hypertension, Received Chemotherapy, Confinement Anxiety 05/27/2020] [N/A:N/A] Date Acquired: [3:0] [N/A:N/A] Weeks of Treatment: [3:Open] [N/A:N/A] Wound Status: [3:2.5x0.5x0.1] [N/A:N/A] Measurements L x W x D (cm) [3:0.982] [N/A:N/A] A (cm) : rea [3:0.098] [N/A:N/A] Volume (cm) : [3:Full Thickness Without Exposed] [N/A:N/A] Classification: [3:Support Structures Small] [N/A:N/A] Exudate A mount: [3:Serous] [N/A:N/A] Exudate Type: [3:amber] [N/A:N/A] Exudate Color: [3:Flat and Intact] [N/A:N/A] Wound Margin: [3:Medium (34-66%)] [N/A:N/A] Granulation A mount: [3:Pink]  [N/A:N/A] Granulation Quality: [3:Medium (34-66%)] [N/A:N/A] Necrotic A mount: [3:Fat Layer (Subcutaneous Tissue): Yes N/A] Exposed Structures: [3:Fascia: No Tendon: No Muscle: No Joint: No Bone: No Medium (34-66%)] [N/A:N/A] Epithelialization: [3:Debridement - Excisional] [N/A:N/A] Debridement: Pre-procedure Verification/Time Out 08:39 [N/A:N/A] Taken: [3:Subcutaneous] [N/A:N/A] Tissue Debrided: [3:Skin/Subcutaneous Tissue] [N/A:N/A] Level: [3:0.5] [N/A:N/A] Debridement A (sq cm): [3:rea Curette] [N/A:N/A] Instrument: [3:Minimum] [N/A:N/A] Bleeding: [3:Pressure] [N/A:N/A] Hemostasis A chieved: [3:Procedure was tolerated well] [N/A:N/A] Debridement Treatment Response: [3:2.5x0.5x0.1] [N/A:N/A] Post Debridement Measurements L x W x D (cm) [3:0.098] [N/A:N/A] Post Debridement Volume: (cm) [3:Compression Therapy] [N/A:N/A] Procedures Performed: [3:Debridement] Treatment Notes Electronic Signature(s) Signed: 07/12/2020 5:27:23 PM By: Linton Ham MD Signed: 07/12/2020 5:38:00 PM By: Lorrin Jackson Entered By: Linton Ham on 07/12/2020 08:57:30 -------------------------------------------------------------------------------- Multi-Disciplinary Care Plan Details Patient Name: Date of Service: [name], [name] 07/12/2020 7:30 A M Medical Record Number: [id number] Patient Account Number: [id number] Date of Birth/Sex: Treating RN: [birth year] (70 y.o. Sue Lush Primary Care Severiano Utsey: Other Clinician: Hassell Done, [name] Referring Verne Cove: Treating Lagena Strand/Extender: Antonietta Breach, [name] Weeks in Treatment: 0 Active Inactive Orientation to the Wound Care Program Nursing Diagnoses: Knowledge deficit related to the wound healing center program Goals: Patient/caregiver will verbalize understanding of the [place] Date Initiated: 07/12/2020 Target Resolution Date: 08/09/2020 Goal Status: Active Interventions: Provide education on  orientation to the wound center Notes: Wound/Skin Impairment Nursing Diagnoses: Impaired tissue integrity Goals: Patient/caregiver will verbalize understanding of skin care regimen Date Initiated: 07/12/2020 Target Resolution Date: 08/09/2020 Goal Status: Active Ulcer/skin breakdown will have a volume reduction of 30% by week 4 Date Initiated: 07/12/2020 Target Resolution Date: 08/09/2020 Goal Status: Active Interventions: Assess patient/caregiver ability to obtain necessary supplies Assess patient/caregiver ability to perform ulcer/skin care regimen upon admission and as needed Assess ulceration(s) every visit Provide education on ulcer and skin care Treatment Activities: Skin care regimen initiated : 07/12/2020 Topical wound management initiated : 07/12/2020 Notes: Electronic Signature(s) Signed: 07/12/2020 8:34:13 AM By: Lorrin Jackson Entered By: Lorrin Jackson on 07/12/2020 08:34:12 -------------------------------------------------------------------------------- Pain Assessment Details Patient Name: Date of Service: [name], [name] 07/12/2020 7:30 A M Medical Record Number: [id number] Patient Account Number: [id number] Date of Birth/Sex: Treating RN: 1950/06/20 (70 y.o. [name] Primary Care Marilee Ditommaso: Hassell Done, [name] Other Clinician: Referring Paiden Cavell: Treating Aulton Routt/Extender: Antonietta Breach, [name] Weeks in Treatment: 0 Active Problems Location of Pain Severity and Description of Pain Patient Has Paino No Site Locations Rate the pain. Rate the pain. Current Pain Level: 0 Pain Management and Medication Current Pain Management: Electronic Signature(s) Signed: 07/12/2020 5:33:29 PM By: Baruch Gouty RN, BSN Entered By: Baruch Gouty on 07/12/2020 08:12:01 -------------------------------------------------------------------------------- Patient/Caregiver Education Details Patient Name: Date of Service: [name], Denise Ion  6/17/2022andnbsp7:30 A M Medical Record Number: [id number] Patient Account Number: [id number] Date of Birth/Gender: Treating RN: 1950/11/25 (70 y.o. Sue Lush Primary Care Physician: Hassell Done, [name] Other Clinician: Referring Physician: Treating Physician/Extender: Antonietta Breach, [name] Weeks in Treatment: 0 Education Assessment Education Provided To: Patient Education Topics Provided Nutrition: Methods: Explain/Verbal, Printed Responses: State content correctly Welcome  T [place]: Welcome T [place] o Methods: Explain/Verbal, Printed Responses: State content correctly Wound/Skin Impairment: Methods: Demonstration, Explain/Verbal, Printed Responses: State content correctly Electronic Signature(s) Signed: 07/12/2020 5:38:00 PM By: Lorrin Jackson Entered By: Lorrin Jackson on 07/12/2020 08:34:46 -------------------------------------------------------------------------------- Wound Assessment Details Patient Name: Date of Service: [name], [name] 07/12/2020 7:30 A M Medical Record Number: [id number] Patient Account Number: [id number] Date of Birth/Sex: Treating RN: 1950-11-27 (70 y.o. Martyn Malay, Linda Primary Care Charrise Lardner: Hassell Done, [name] Other Clinician: Referring Jerett Odonohue: Treating Tarry Fountain/Extender: Antonietta Breach, [name] Weeks in Treatment: 0 Wound Status Wound Number: 3 Primary Infection - not elsewhere classified Etiology: Wound Location: Left, Medial Malleolus Wound Open Wounding Event: Gradually Appeared Status: Date Acquired: 05/27/2020 Comorbid Arrhythmia, Deep Vein Thrombosis, Hypertension, Received Weeks Of Treatment: 0 History: Chemotherapy, Confinement Anxiety Clustered Wound: No Photos Wound Measurements Length: (cm) 2.5 Width: (cm) 0.5 Depth: (cm) 0.1 Area: (cm) 0.982 Volume: (cm) 0.098 % Reduction in Area: 0% % Reduction in Volume:  0% Epithelialization: Medium (34-66%) Tunneling: No Undermining: No Wound Description Classification: Full Thickness Without Exposed Support Structures Wound Margin: Flat and Intact Exudate Amount: Small Exudate Type: Serous Exudate Color: amber Foul Odor After Cleansing: No Slough/Fibrino Yes Wound Bed Granulation Amount: Medium (34-66%) Exposed Structure Granulation Quality: Pink Fascia Exposed: No Necrotic Amount: Medium (34-66%) Fat Layer (Subcutaneous Tissue) Exposed: Yes Necrotic Quality: Adherent Slough Tendon Exposed: No Muscle Exposed: No Joint Exposed: No Bone Exposed: No Treatment Notes Wound #3 (Malleolus) Wound Laterality: Left, Medial Cleanser Soap and Water Discharge Instruction: May shower and wash wound with dial antibacterial soap and water prior to dressing change. Wound Cleanser Discharge Instruction: Cleanse the wound with wound cleanser prior to applying a clean dressing using gauze sponges, not tissue or cotton balls. Peri-Wound Care Triamcinolone 15 (g) Discharge Instruction: T Wound and Periwound o Topical Primary Dressing KerraCel Ag Gelling Fiber Dressing, 4x5 in (silver alginate) Discharge Instruction: Apply silver alginate to wound bed as instructed Secondary Dressing Woven Gauze Sponge, Non-Sterile 4x4 in Discharge Instruction: Apply over primary dressing as directed. ABD Pad, 5x9 Discharge Instruction: Apply over primary dressing as directed. Secured With Compression Wrap ThreePress (3 layer compression wrap) Discharge Instruction: Apply three layer compression as directed. Compression Stockings Add-Ons Electronic Signature(s) Signed: 07/15/2020 6:32:01 PM By: Baruch Gouty RN, BSN Signed: 07/18/2020 8:38:46 AM By: Leane Call Previous Signature: 07/12/2020 5:33:29 PM Version By: Baruch Gouty RN, BSN Entered By: Leane Call on 07/15/2020  14:47:24 -------------------------------------------------------------------------------- Vitals Details Patient Name: Date of Service: [name], [name] 07/12/2020 7:30 A M Medical Record Number: [id number] Patient Account Number: [id number] Date of Birth/Sex: Treating RN: May 17, 1950 (70 y.o. [name] Primary Care Carold Eisner: Hassell Done, [name] Other Clinician: Referring Karema Tocci: Treating Romell Wolden/Extender: Antonietta Breach, [name] Weeks in Treatment: 0 Vital Signs Time Taken: 07:51 Temperature (F): 98.1 Height (in): 67 Pulse (bpm): 71 Source: Stated Respiratory Rate (breaths/min): 18 Weight (lbs): 244 Blood Pressure (mmHg): 144/81 Source: Stated Reference Range: 80 - 120 mg / dl Body Mass Index (BMI): 38.2 Electronic Signature(s) Signed: 07/12/2020 5:33:29 PM By: Baruch Gouty RN, BSN Entered By: Baruch Gouty on 07/12/2020 07:52:17

## 2020-07-19 ENCOUNTER — Encounter (HOSPITAL_BASED_OUTPATIENT_CLINIC_OR_DEPARTMENT_OTHER): Payer: PPO | Admitting: Internal Medicine

## 2020-07-19 ENCOUNTER — Other Ambulatory Visit: Payer: Self-pay

## 2020-07-19 DIAGNOSIS — L97322 Non-pressure chronic ulcer of left ankle with fat layer exposed: Secondary | ICD-10-CM | POA: Diagnosis not present

## 2020-07-19 DIAGNOSIS — L0889 Other specified local infections of the skin and subcutaneous tissue: Secondary | ICD-10-CM | POA: Diagnosis not present

## 2020-07-19 DIAGNOSIS — L97821 Non-pressure chronic ulcer of other part of left lower leg limited to breakdown of skin: Secondary | ICD-10-CM | POA: Diagnosis not present

## 2020-07-19 NOTE — Progress Notes (Addendum)
CINTHYA, BORS ([id number]) Visit Report for 07/19/2020 Debridement Details Patient Name: Date of Service: [name], DEITRA CRAINE 07/19/2020 9:00 A M Medical Record Number: [id number] Patient Account Number: [id number] Date of Birth/Sex: Treating RN: 01-12-1951 (70 y.o. Martyn Malay, Linda Primary Care Provider: Hassell Done, [name] Other Clinician: Referring Provider: Treating Provider/Extender: Antonietta Breach, [name] Weeks in Treatment: 1 Debridement Performed for Assessment: Wound #3 Left,Medial Malleolus Performed By: Physician Ricard Dillon., MD Debridement Type: Debridement Level of Consciousness (Pre-procedure): Awake and Alert Pre-procedure Verification/Time Out Yes - 10:00 Taken: Start Time: 10:00 Pain Control: Other : benzocaine 20% spray T Area Debrided (L x W): otal 2.4 (cm) x 0.4 (cm) = 0.96 (cm) Tissue and other material debrided: Viable, Non-Viable, Slough, Subcutaneous, Slough Level: Skin/Subcutaneous Tissue Debridement Description: Excisional Instrument: Curette Bleeding: Minimum Hemostasis Achieved: Pressure End Time: 10:02 Procedural Pain: 0 Post Procedural Pain: 0 Response to Treatment: Procedure was tolerated well Level of Consciousness (Post- Awake and Alert procedure): Post Debridement Measurements of Total Wound Length: (cm) 2.4 Width: (cm) 0.4 Depth: (cm) 0.1 Volume: (cm) 0.075 Character of Wound/Ulcer Post Debridement: Improved Post Procedure Diagnosis Same as Pre-procedure Electronic Signature(s) Signed: 07/19/2020 5:20:40 PM By: Linton Ham MD Signed: 07/19/2020 6:17:32 PM By: Baruch Gouty RN, BSN Entered By: Linton Ham on 07/19/2020 10:15:53 -------------------------------------------------------------------------------- HPI Details Patient Name: Date of Service: [name], Nikeisha L. 07/19/2020 9:00 A M Medical Record Number: [id number] Patient Account Number: [id number] Date of Birth/Sex: Treating RN: 1950-06-01  (70 y.o. [name] Primary Care Provider: Hassell Done, [name] Other Clinician: Referring Provider: Treating Provider/Extender: Antonietta Breach, [name] Weeks in Treatment: 1 History of Present Illness HPI Description: ADMISSION 07/12/17 This is a 70 year old woman who lives in [place]. She is a nondiabetic nonsmoker but she has factor V Leiden deficiency and a history of DVT in the left leg. She is on chronic anticoagulation with Coumadin more recently switched to Xarelto. 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 she had a wound in 2014 in the same area and was seen in the wound care clinic I think in [place] and the wound closed. Patient is had a history of a wound on the left medial ankle since October 2018. She was seen by her primary physician in January 2009 with the same venous ulcer. Was given [facility]. She was not followed up until May of this year at an urgent care at [facility]. She was felt to have cellulitis and given clindamycin. She was seen in the same urgent care on 07/06/17 also felt to have refractory cellulitis and the clindamycin was extended although the 2 days later she developed abdominal pain without diarrhea she stop the clindamycin and the pain resolved. She is simply washing this off with soap and water. She has not had venous reflux studies however she has had several ultrasounds of the left leg most recently in July 2016. This was done due to chronic left lower extremity pain and swelling. The result was that the left femoral vein remains atretic with sequela of prior DVT She has stable chronic deep venous . thrombosis in the left popliteal vein. No new thrombosis is noted. Superficial varicose varicosities noted in the left ankle. Beside the factor V Leiden the patient has a history of a choose one tumor as noted. Hypertension ventricular tachycardia B12  deficiency and anxiety. She has a history of allergy to nylon which she states is precluded  wearing compression stockings although she has worn TED hose that have not cause any skin breakdown. She is not currently wearing these. ABI in our clinic was 0.91 on the left 07/16/17; 2 wounds on theleft medial calf/ankle. These are reminiscence once larger wound. The dimensions are smaller. Surface of the wound looks healthy. The patient has no specific complaints of pain or drainage. She is allergic to nylon but handled R3 layer compression with a Kerlix contact layer well. No burning no itching no skin irritation 07/23/17; 2 wounds on the left medial calf/ankle. Patient has no specific complaints to the 3 layer with Kerlix surface that we are using to avoid her nylon allergy. She has no complaints. Wounds are smaller 07/30/17; 2 wounds on the left medial calf/ankle. Larger one is superior. We have been using 3 layer compression with Kerlix as the patient has a nylon allergy. Her wounds are smaller. She is researching stockings without nylon online which will have to be 20-30 mm compression 08/06/17; the inferior wound is fully epithelialized. Superior wound has a small satellite lesion just superior to it. We've been using silver collagen. We are providing the 3 layer compression. The patient tells me she has compression stockings she is ordered off [place] 08/13/17; both wounds are fully epithelialized. Nice result. The patient has her own compression stockings that are new. She will need to lubricate her skin. READMISSION 07/12/2020 Mrs. Bari is a 70 year old woman now who has chronic venous disease secondary to factor V Leiden deficiency and a history of DVT She is on chronic s. anticoagulation and although my previous notes suggest Eliquis she says she is actually on Xarelto. She is not a diabetic. She tells [place] that in early May she took a trip to [place] with family. Almost as soon as she  got out of the car she had inflammation and erythema in the area this did not get any better. She saw her primary doctor and was given ciprofloxacin although she developed some form of reaction to this involving her mouth and lips. She was switched to doxycycline which she is just finishing. She developed an area of breakdown on the left medial lower leg and ankle area. Most of this is already healed. She chronically wears 20/30 below-knee compression stockings although they are old but she did not wear them while she had the infection. 6/24; patient arrives 1 week follow-up. We are using silver alginate under compression wound looks a lot better. Electronic Signature(s) Signed: 07/19/2020 5:20:40 PM By: Linton Ham MD Entered By: Linton Ham on 07/19/2020 10:16:15 -------------------------------------------------------------------------------- Physical Exam Details Patient Name: Date of Service: [name], Tykesha L. 07/19/2020 9:00 A M Medical Record Number: [id number] Patient Account Number: [id number] Date of Birth/Sex: Treating RN: 18-Jun-1950 (70 y.o. [name] Primary Care Provider: Hassell Done, [name] Other Clinician: Referring Provider: Treating Provider/Extender: Antonietta Breach, [name] Weeks in Treatment: 1 Notes Wound exam; on the medial part of the left lower leg which is in the middle of scar tissue. She still has the comma shaped wound although the, is a lot smaller I used a #5 curette to remove callus and subcutaneous debris there are 2 open areas here. this is on the way to healing Electronic Signature(s) Signed: 07/19/2020 5:20:40 PM By: Linton Ham MD Entered By: Linton Ham on 07/19/2020 10:17:18 -------------------------------------------------------------------------------- Physician Orders Details Patient Name: Date of Service: [name], Terricka L. 07/19/2020 9:00 A M Medical Record Number: [id number] Patient Account Number:  [id number] Date of Birth/Sex: Treating  RN: [birth year] (70 y.o. [name] Primary Care Provider: Hassell Done, [name] Other Clinician: Referring Provider: Treating Provider/Extender: Antonietta Breach, [name] Weeks in Treatment: 1 Verbal / Phone Orders: No Diagnosis Coding ICD-10 Coding Code Description [phone] Chronic venous hypertension (idiopathic) with ulcer and inflammation of left lower extremity L97.821 Non-pressure chronic ulcer of other part of left lower leg limited to breakdown of skin D68.8 Other specified coagulation defects Follow-up Appointments ppointment in 1 week. - with Dr. Dellia Nims Return A Bathing/ Shower/ Hygiene May shower with protection but do not get wound dressing(s) wet. Edema Control - Lymphedema / SCD / Other Elevate legs to the level of the heart or above for 30 minutes daily and/or when sitting, a frequency of: Avoid standing for long periods of time. Other Edema Control Orders/Instructions: - Obtain a new pair of 20-30 compression stockings Additional Orders / Instructions Follow Nutritious Diet Wound Treatment Wound #3 - Malleolus Wound Laterality: Left, Medial Cleanser: Soap and Water 1 x Per Week/30 Days Discharge Instructions: May shower and wash wound with dial antibacterial soap and water prior to dressing change. Cleanser: Wound Cleanser 1 x Per Week/30 Days Discharge Instructions: Cleanse the wound with wound cleanser prior to applying a clean dressing using gauze sponges, not tissue or cotton balls. Peri-Wound Care: Triamcinolone 15 (g) 1 x Per Week/30 Days Discharge Instructions: T Wound and Periwound o Prim Dressing: KerraCel Ag Gelling Fiber Dressing, 4x5 in (silver alginate) 1 x Per Week/30 Days ary Discharge Instructions: Apply silver alginate to wound bed as instructed Secondary Dressing: Woven Gauze Sponge, Non-Sterile 4x4 in 1 x Per Week/30 Days Discharge Instructions: Apply over primary dressing as  directed. Secondary Dressing: ABD Pad, 5x9 1 x Per Week/30 Days Discharge Instructions: Apply over primary dressing as directed. Compression Wrap: ThreePress (3 layer compression wrap) 1 x Per Week/30 Days Discharge Instructions: Apply three layer compression as directed. Electronic Signature(s) Signed: 07/19/2020 5:20:40 PM By: Linton Ham MD Signed: 07/19/2020 6:17:32 PM By: Baruch Gouty RN, BSN Entered By: Baruch Gouty on 07/19/2020 10:06:28 -------------------------------------------------------------------------------- Problem List Details Patient Name: Date of Service: [name], Tyara L. 07/19/2020 9:00 A M Medical Record Number: [id number] Patient Account Number: [id number] Date of Birth/Sex: Treating RN: 10-11-1950 (70 y.o. [name] Primary Care Provider: Hassell Done, [name] Other Clinician: Referring Provider: Treating Provider/Extender: Antonietta Breach, [name] Weeks in Treatment: 1 Active Problems ICD-10 Encounter Code Description Active Date MDM Diagnosis I87.332 Chronic venous hypertension (idiopathic) with ulcer and inflammation of left 07/12/2020 No Yes lower extremity L97.821 Non-pressure chronic ulcer of other part of left lower leg limited to breakdown 07/12/2020 No Yes of skin D68.8 Other specified coagulation defects 07/12/2020 No Yes Inactive Problems Resolved Problems Electronic Signature(s) Signed: 07/19/2020 5:20:40 PM By: Linton Ham MD Entered By: Linton Ham on 07/19/2020 10:15:05 -------------------------------------------------------------------------------- Progress Note Details Patient Name: Date of Service: [name], Shakaria L. 07/19/2020 9:00 A M Medical Record Number: [id number] Patient Account Number: [id number] Date of Birth/Sex: Treating RN: November 05, 1950 (70 y.o. [name] Primary Care Provider: Hassell Done, [name] Other Clinician: Referring Provider: Treating Provider/Extender: Antonietta Breach, [name] Weeks in Treatment: 1 Subjective History of Present Illness (HPI) ADMISSION 07/12/17 This is a 70 year old woman who lives in [place]. She is a nondiabetic nonsmoker but she has factor V Leiden deficiency and a history of DVT in the left leg. She is on chronic anticoagulation with Coumadin more recently switched to Xarelto. 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 she had a wound in 2014 in the same area and was seen in the wound care clinic I think in [place] and the wound closed. Patient is had a history of a wound on the left medial ankle since October 2018. She was seen by her primary physician in January 2009 with the same venous ulcer. Was given [facility]. She was not followed up until May of this year at an urgent care at [facility]. She was felt to have cellulitis and given clindamycin. She was seen in the same urgent care on 07/06/17 also felt to have refractory cellulitis and the clindamycin was extended although the 2 days later she developed abdominal pain without diarrhea she stop the clindamycin and the pain resolved. She is simply washing this off with soap and water. She has not had venous reflux studies however she has had several ultrasounds of the left leg most recently in July 2016. This was done due to chronic left lower extremity pain and swelling. The result was that the left femoral vein remains atretic with sequela of prior DVT She has stable chronic deep venous . thrombosis in the left popliteal vein. No new thrombosis is noted. Superficial varicose varicosities noted in the left ankle. Beside the factor V Leiden the patient has a history of a choose one tumor as noted. Hypertension ventricular tachycardia B12 deficiency and anxiety. She has a history of allergy to nylon which she states is precluded wearing compression stockings although she has worn TED hose  that have not cause any skin breakdown. She is not currently wearing these. ABI in our clinic was 0.91 on the left 07/16/17; 2 wounds on theleft medial calf/ankle. These are reminiscence once larger wound. The dimensions are smaller. Surface of the wound looks healthy. The patient has no specific complaints of pain or drainage. She is allergic to nylon but handled R3 layer compression with a Kerlix contact layer well. No burning no itching no skin irritation 07/23/17; 2 wounds on the left medial calf/ankle. Patient has no specific complaints to the 3 layer with Kerlix surface that we are using to avoid her nylon allergy. She has no complaints. Wounds are smaller 07/30/17; 2 wounds on the left medial calf/ankle. Larger one is superior. We have been using 3 layer compression with Kerlix as the patient has a nylon allergy. Her wounds are smaller. She is researching stockings without nylon online which will have to be 20-30 mm compression 08/06/17; the inferior wound is fully epithelialized. Superior wound has a small satellite lesion just superior to it. We've been using silver collagen. We are providing the 3 layer compression. The patient tells me she has compression stockings she is ordered off [place] 08/13/17; both wounds are fully epithelialized. Nice result. The patient has her own compression stockings that are new. She will need to lubricate her skin. READMISSION 07/12/2020 Mrs. Cleland is a 70 year old woman now who has chronic venous disease secondary to factor V Leiden deficiency and a history of DVT She is on chronic s. anticoagulation and although my previous notes suggest Eliquis she says she is actually on Xarelto. She is not a diabetic. She tells [place] that in early May she took a trip to [place] with family. Almost as soon as she got out of the car she had inflammation and erythema in the area this did not get any better. She saw her primary doctor and was given ciprofloxacin  although she developed some form  of reaction to this involving her mouth and lips. She was switched to doxycycline which she is just finishing. She developed an area of breakdown on the left medial lower leg and ankle area. Most of this is already healed. She chronically wears 20/30 below-knee compression stockings although they are old but she did not wear them while she had the infection. 6/24; patient arrives 1 week follow-up. We are using silver alginate under compression wound looks a lot better. Objective Constitutional Vitals Time Taken: 9:22 AM, Height: 67 in, Weight: 244 lbs, BMI: 38.2, Temperature: 97.8 F, Pulse: 56 bpm, Respiratory Rate: 18 breaths/min, Blood Pressure: 147/84 mmHg. Integumentary (Hair, Skin) Wound #3 status is Open. Original cause of wound was Gradually Appeared. The date acquired was: 05/27/2020. The wound has been in treatment 1 weeks. The wound is located on the Left,Medial Malleolus. The wound measures 2.4cm length x 0.4cm width x 0.1cm depth; 0.754cm^2 area and 0.075cm^3 volume. There is Fat Layer (Subcutaneous Tissue) exposed. There is a small amount of serous drainage noted. The wound margin is flat and intact. There is large (67-100%) pink granulation within the wound bed. There is a small (1-33%) amount of necrotic tissue within the wound bed including Adherent Slough. Assessment Active Problems ICD-10 Chronic venous hypertension (idiopathic) with ulcer and inflammation of left lower extremity Non-pressure chronic ulcer of other part of left lower leg limited to breakdown of skin Other specified coagulation defects Procedures Wound #3 Pre-procedure diagnosis of Wound #3 is an Infection - not elsewhere classified located on the Left,Medial Malleolus . There was a Excisional Skin/Subcutaneous Tissue Debridement with a total area of 0.96 sq cm performed by Ricard Dillon., MD. With the following instrument(s): Curette to remove Viable and Non-Viable  tissue/material. Material removed includes Subcutaneous Tissue and Slough and after achieving pain control using Other (benzocaine 20% spray). No specimens were taken. A time out was conducted at 10:00, prior to the start of the procedure. A Minimum amount of bleeding was controlled with Pressure. The procedure was tolerated well with a pain level of 0 throughout and a pain level of 0 following the procedure. Post Debridement Measurements: 2.4cm length x 0.4cm width x 0.1cm depth; 0.075cm^3 volume. Character of Wound/Ulcer Post Debridement is improved. Post procedure Diagnosis Wound #3: Same as Pre-Procedure Pre-procedure diagnosis of Wound #3 is an Infection - not elsewhere classified located on the Left,Medial Malleolus . There was a Three Layer Compression Therapy Procedure by Deon Pilling, RN. Post procedure Diagnosis Wound #3: Same as Pre-Procedure Plan Follow-up Appointments: Return Appointment in 1 week. - with Dr. Arcola Jansky Shower/ Hygiene: May shower with protection but do not get wound dressing(s) wet. Edema Control - Lymphedema / SCD / Other: Elevate legs to the level of the heart or above for 30 minutes daily and/or when sitting, a frequency of: Avoid standing for long periods of time. Other Edema Control Orders/Instructions: - Obtain a new pair of 20-30 compression stockings Additional Orders / Instructions: Follow Nutritious Diet WOUND #3: - Malleolus Wound Laterality: Left, Medial Cleanser: Soap and Water 1 x Per Week/30 Days Discharge Instructions: May shower and wash wound with dial antibacterial soap and water prior to dressing change. Cleanser: Wound Cleanser 1 x Per Week/30 Days Discharge Instructions: Cleanse the wound with wound cleanser prior to applying a clean dressing using gauze sponges, not tissue or cotton balls. Peri-Wound Care: Triamcinolone 15 (g) 1 x Per Week/30 Days Discharge Instructions: T Wound and Periwound o Prim Dressing: KerraCel Ag Gelling  Fiber Dressing,  4x5 in (silver alginate) 1 x Per Week/30 Days ary Discharge Instructions: Apply silver alginate to wound bed as instructed Secondary Dressing: Woven Gauze Sponge, Non-Sterile 4x4 in 1 x Per Week/30 Days Discharge Instructions: Apply over primary dressing as directed. Secondary Dressing: ABD Pad, 5x9 1 x Per Week/30 Days Discharge Instructions: Apply over primary dressing as directed. Com pression Wrap: ThreePress (3 layer compression wrap) 1 x Per Week/30 Days Discharge Instructions: Apply three layer compression as directed. 1. Still with the silver alginate as the primary dressing and still under 3 layer compression 2. She has a 20/30 mmHg below-knee stocking for when this heals Electronic Signature(s) Signed: 07/19/2020 5:20:40 PM By: Linton Ham MD Entered By: Linton Ham on 07/19/2020 10:18:03 -------------------------------------------------------------------------------- SuperBill Details Patient Name: Date of Service: [name], Lawernce Ion 07/19/2020 Medical Record Number: [id number] Patient Account Number: [id number] Date of Birth/Sex: Treating RN: [birth year] (70 y.o. [name] Primary Care Provider: Hassell Done, [name] Other Clinician: Referring Provider: Treating Provider/Extender: Antonietta Breach, [name] Weeks in Treatment: 1 Diagnosis Coding ICD-10 Codes Code Description [phone] Chronic venous hypertension (idiopathic) with ulcer and inflammation of left lower extremity L97.821 Non-pressure chronic ulcer of other part of left lower leg limited to breakdown of skin D68.8 Other specified coagulation defects Facility Procedures CPT4 Code: [id number] Description: 62130 - DEB SUBQ TISSUE 20 SQ CM/< ICD-10 Diagnosis Description L97.821 Non-pressure chronic ulcer of other part of left lower leg limited to breakdown Modifier: of skin Quantity: 1 Physician Procedures Electronic Signature(s) Signed: 07/19/2020 5:20:40 PM By: Linton Ham MD Entered By: Linton Ham on 07/19/2020 10:18:13

## 2020-07-24 ENCOUNTER — Other Ambulatory Visit: Payer: Self-pay

## 2020-07-24 ENCOUNTER — Encounter (HOSPITAL_BASED_OUTPATIENT_CLINIC_OR_DEPARTMENT_OTHER): Payer: PPO | Admitting: Internal Medicine

## 2020-07-24 DIAGNOSIS — L97322 Non-pressure chronic ulcer of left ankle with fat layer exposed: Secondary | ICD-10-CM | POA: Diagnosis not present

## 2020-07-24 DIAGNOSIS — L0889 Other specified local infections of the skin and subcutaneous tissue: Secondary | ICD-10-CM | POA: Diagnosis not present

## 2020-07-24 DIAGNOSIS — L97821 Non-pressure chronic ulcer of other part of left lower leg limited to breakdown of skin: Secondary | ICD-10-CM | POA: Diagnosis not present

## 2020-07-24 NOTE — Progress Notes (Signed)
KRYSTYL, CANNELL ([id number]) Visit Report for 07/24/2020 Debridement Details Patient Name: Date of Service: NA NCESeher, Schlagel 07/24/2020 8:15 A M Medical Record Number: [id number] Patient Account Number: [id number] Date of Birth/Sex: Treating RN: 1950-10-29 (70 y.o. Nancy Fetter Primary Care Provider: Hassell Done, [name] Other Clinician: Referring Provider: Treating Provider/Extender: Antonietta Breach, [name] Weeks in Treatment: 1 Debridement Performed for Assessment: Wound #3 Left,Medial Malleolus Performed By: Physician Ricard Dillon., MD Debridement Type: Debridement Level of Consciousness (Pre-procedure): Awake and Alert Pre-procedure Verification/Time Out Yes - 09:23 Taken: Start Time: 09:23 T Area Debrided (L x W): otal 2.5 (cm) x 0.5 (cm) = 1.25 (cm) Tissue and other material debrided: Viable, Non-Viable, Subcutaneous, Skin: Epidermis Level: Skin/Subcutaneous Tissue Debridement Description: Excisional Instrument: Curette Bleeding: Minimum Hemostasis Achieved: Pressure End Time: 09:24 Procedural Pain: 0 Post Procedural Pain: 0 Response to Treatment: Procedure was tolerated well Level of Consciousness (Post- Awake and Alert procedure): Post Debridement Measurements of Total Wound Length: (cm) 2.5 Width: (cm) 0.5 Depth: (cm) 0.1 Volume: (cm) 0.098 Character of Wound/Ulcer Post Debridement: Improved Post Procedure Diagnosis Same as Pre-procedure Electronic Signature(s) Signed: 07/24/2020 4:56:05 PM By: Levan Hurst RN, BSN Signed: 07/24/2020 4:58:19 PM By: Linton Ham MD Entered By: Linton Ham on 07/24/2020 09:54:00 -------------------------------------------------------------------------------- HPI Details Patient Name: Date of Service: [name], Charday L. 07/24/2020 8:15 A M Medical Record Number: [id number] Patient Account Number: [id number] Date of Birth/Sex: Treating RN: 07-10-1950 (70 y.o. Nancy Fetter Primary Care  Provider: Hassell Done, [name] Other Clinician: Referring Provider: Treating Provider/Extender: Antonietta Breach, [name] Weeks in Treatment: 1 History of Present Illness HPI Description: ADMISSION 07/12/17 This is a 70 year old woman who lives in [place]. She is a nondiabetic nonsmoker but she has factor V Leiden deficiency and a history of DVT in the left leg. She is on chronic anticoagulation with Coumadin more recently switched to Xarelto. 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 she had a wound in 2014 in the same area and was seen in the wound care clinic I think in [place] and the wound closed. Patient is had a history of a wound on the left medial ankle since October 2018. She was seen by her primary physician in January 2009 with the same venous ulcer. Was given [facility] She was not followed up until May of this year at an urgent care at [facility]. She was felt to have cellulitis and given clindamycin. She was seen in the same urgent care on 07/06/17 also felt to have refractory cellulitis and the clindamycin was extended although the 2 days later she developed abdominal pain without diarrhea she stop the clindamycin and the pain resolved. She is simply washing this off with soap and water. She has not had venous reflux studies however she has had several ultrasounds of the left leg most recently in July 2016. This was done due to chronic left lower extremity pain and swelling. The result was that the left femoral vein remains atretic with sequela of prior DVT She has stable chronic deep venous . thrombosis in the left popliteal vein. No new thrombosis is noted. Superficial varicose varicosities noted in the left ankle. Beside the factor V Leiden the patient has a history of a choose one tumor as noted. Hypertension ventricular tachycardia B12 deficiency and anxiety. She has a history of  allergy to nylon which she states is precluded wearing compression stockings although she has worn  TED hose that have not cause any skin breakdown. She is not currently wearing these. ABI in our clinic was 0.91 on the left 07/16/17; 2 wounds on theleft medial calf/ankle. These are reminiscence once larger wound. The dimensions are smaller. Surface of the wound looks healthy. The patient has no specific complaints of pain or drainage. She is allergic to nylon but handled R3 layer compression with a Kerlix contact layer well. No burning no itching no skin irritation 07/23/17; 2 wounds on the left medial calf/ankle. Patient has no specific complaints to the 3 layer with Kerlix surface that we are using to avoid her nylon allergy. She has no complaints. Wounds are smaller 07/30/17; 2 wounds on the left medial calf/ankle. Larger one is superior. We have been using 3 layer compression with Kerlix as the patient has a nylon allergy. Her wounds are smaller. She is researching stockings without nylon online which will have to be 20-30 mm compression 08/06/17; the inferior wound is fully epithelialized. Superior wound has a small satellite lesion just superior to it. We've been using silver collagen. We are providing the 3 layer compression. The patient tells me she has compression stockings she is ordered off [employer] 08/13/17; both wounds are fully epithelialized. Nice result. The patient has her own compression stockings that are new. She will need to lubricate her skin. READMISSION 07/12/2020 Mrs. Correia is a 70 year old woman now who has chronic venous disease secondary to factor V Leiden deficiency and a history of DVT She is on chronic s. anticoagulation and although my previous notes suggest Eliquis she says she is actually on Xarelto. She is not a diabetic. She tells [place] that in early May she took a trip to [place] with family. Almost as soon as she got out of the car she had inflammation  and erythema in the area this did not get any better. She saw her primary doctor and was given ciprofloxacin although she developed some form of reaction to this involving her mouth and lips. She was switched to doxycycline which she is just finishing. She developed an area of breakdown on the left medial lower leg and ankle area. Most of this is already healed. She chronically wears 20/30 below-knee compression stockings although they are old but she did not wear them while she had the infection. 6/24; patient arrives 1 week follow-up. We are using silver alginate under compression wound looks a lot better. 6/29; 1 week follow-up. Not much change although the wounds surfaces still look as though they are smaller. Surrounding slough and eschar. Edema control is good Electronic Signature(s) Signed: 07/24/2020 4:58:19 PM By: Linton Ham MD Entered By: Linton Ham on 07/24/2020 09:54:52 -------------------------------------------------------------------------------- Physical Exam Details Patient Name: Date of Service: [name], Jhade L. 07/24/2020 8:15 A M Medical Record Number: [id number] Patient Account Number: [id number] Date of Birth/Sex: Treating RN: February 15, 1950 (70 y.o. Nancy Fetter Primary Care Provider: Hassell Done, [name] Other Clinician: Referring Provider: Treating Provider/Extender: Antonietta Breach, [name] Weeks in Treatment: 1 Constitutional Patient is hypertensive.. Pulse regular and within target range for patient.Marland Kitchen Respirations regular, non-labored and within target range.. Temperature is normal and within the target range for the patient.Marland Kitchen Appears in no distress. Notes Wound exam; medial part of the left lower leg. She still has a small wounded area. Eschar dry flaking skin removed with a #3 curette. This cleans up quite nicely. No edema no evidence of infection Electronic Signature(s) Signed: 07/24/2020 4:58:19 PM By: Linton Ham MD Entered By:  Dellia Nims,  Legrand Como on 07/24/2020 09:55:38 -------------------------------------------------------------------------------- Physician Orders Details Patient Name: Date of Service: [name], ABRA LINGENFELTER. 07/24/2020 8:15 A M Medical Record Number: [id number] Patient Account Number: [id number] Date of Birth/Sex: Treating RN: Jun 05, 1950 (70 y.o. Nancy Fetter Primary Care Provider: Hassell Done, [name] Other Clinician: Referring Provider: Treating Provider/Extender: Antonietta Breach, [name] Weeks in Treatment: 1 Verbal / Phone Orders: No Diagnosis Coding ICD-10 Coding Code Description [phone] Chronic venous hypertension (idiopathic) with ulcer and inflammation of left lower extremity L97.821 Non-pressure chronic ulcer of other part of left lower leg limited to breakdown of skin D68.8 Other specified coagulation defects Follow-up Appointments ppointment in 1 week. - with Dr. Dellia Nims Return A Bathing/ Shower/ Hygiene May shower with protection but do not get wound dressing(s) wet. Edema Control - Lymphedema / SCD / Other Elevate legs to the level of the heart or above for 30 minutes daily and/or when sitting, a frequency of: - throughout the day Avoid standing for long periods of time. Moisturize legs daily. Other Edema Control Orders/Instructions: - Obtain a new pair of 20-30 compression stockings Additional Orders / Instructions Follow Nutritious Diet Wound Treatment Wound #3 - Malleolus Wound Laterality: Left, Medial Cleanser: Soap and Water 1 x Per Week/30 Days Discharge Instructions: May shower and wash wound with dial antibacterial soap and water prior to dressing change. Cleanser: Wound Cleanser 1 x Per Week/30 Days Discharge Instructions: Cleanse the wound with wound cleanser prior to applying a clean dressing using gauze sponges, not tissue or cotton balls. Peri-Wound Care: Triamcinolone 15 (g) 1 x Per Week/30 Days Discharge Instructions: T Wound and  Periwound o Peri-Wound Care: Sween Lotion (Moisturizing lotion) 1 x Per Week/30 Days Discharge Instructions: Apply moisturizing lotion as directed Prim Dressing: Hydrofera Blue Ready Foam, 2.5 x2.5 in 1 x Per Week/30 Days ary Discharge Instructions: Apply to wound bed as instructed Secondary Dressing: Woven Gauze Sponge, Non-Sterile 4x4 in 1 x Per Week/30 Days Discharge Instructions: Apply over primary dressing as directed. Compression Wrap: ThreePress (3 layer compression wrap) 1 x Per Week/30 Days Discharge Instructions: Apply three layer compression as directed. Electronic Signature(s) Signed: 07/24/2020 4:56:05 PM By: Levan Hurst RN, BSN Signed: 07/24/2020 4:58:19 PM By: Linton Ham MD Entered By: Levan Hurst on 07/24/2020 09:26:54 -------------------------------------------------------------------------------- Problem List Details Patient Name: Date of Service: [name], Sandeep L. 07/24/2020 8:15 A M Medical Record Number: [id number] Patient Account Number: [id number] Date of Birth/Sex: Treating RN: Oct 19, 1950 (70 y.o. Nancy Fetter Primary Care Provider: Hassell Done, [name] Other Clinician: Referring Provider: Treating Provider/Extender: Antonietta Breach, [name] Weeks in Treatment: 1 Active Problems ICD-10 Encounter Code Description Active Date MDM Diagnosis I87.332 Chronic venous hypertension (idiopathic) with ulcer and inflammation of left 07/12/2020 No Yes lower extremity L97.821 Non-pressure chronic ulcer of other part of left lower leg limited to breakdown 07/12/2020 No Yes of skin D68.8 Other specified coagulation defects 07/12/2020 No Yes Inactive Problems Resolved Problems Electronic Signature(s) Signed: 07/24/2020 4:58:19 PM By: Linton Ham MD Entered By: Linton Ham on 07/24/2020 09:53:28 -------------------------------------------------------------------------------- Progress Note Details Patient Name: Date of Service: NA  [name], Alfrieda L. 07/24/2020 8:15 A M Medical Record Number: [id number] Patient Account Number: [id number] Date of Birth/Sex: Treating RN: [birth year] (70 y.o. Nancy Fetter Primary Care Provider: Hassell Done, [name] Other Clinician: Referring Provider: Treating Provider/Extender: Antonietta Breach, [name] Weeks in Treatment: 1 Subjective History of Present Illness (HPI) ADMISSION 07/12/17 This is a 70 year old woman who lives in [place]. She is a nondiabetic nonsmoker but she has factor V Leiden deficiency  and a history of DVT in the left leg. She is on chronic anticoagulation with Coumadin more recently switched to Xarelto. 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 she had a wound in 2014 in the same area and was seen in the wound care clinic I think in [place] and the wound closed. Patient is had a history of a wound on the left medial ankle since October 2018. She was seen by her primary physician in January 2009 with the same venous ulcer. Was given [facility]. She was not followed up until May of this year at an urgent care at [facility]. She was felt to have cellulitis and given clindamycin. She was seen in the same urgent care on 07/06/17 also felt to have refractory cellulitis and the clindamycin was extended although the 2 days later she developed abdominal pain without diarrhea she stop the clindamycin and the pain resolved. She is simply washing this off with soap and water. She has not had venous reflux studies however she has had several ultrasounds of the left leg most recently in July 2016. This was done due to chronic left lower extremity pain and swelling. The result was that the left femoral vein remains atretic with sequela of prior DVT She has stable chronic deep venous . thrombosis in the left popliteal vein. No new thrombosis is noted. Superficial varicose varicosities noted in  the left ankle. Beside the factor V Leiden the patient has a history of a choose one tumor as noted. Hypertension ventricular tachycardia B12 deficiency and anxiety. She has a history of allergy to nylon which she states is precluded wearing compression stockings although she has worn TED hose that have not cause any skin breakdown. She is not currently wearing these. ABI in our clinic was 0.91 on the left 07/16/17; 2 wounds on theleft medial calf/ankle. These are reminiscence once larger wound. The dimensions are smaller. Surface of the wound looks healthy. The patient has no specific complaints of pain or drainage. She is allergic to nylon but handled R3 layer compression with a Kerlix contact layer well. No burning no itching no skin irritation 07/23/17; 2 wounds on the left medial calf/ankle. Patient has no specific complaints to the 3 layer with Kerlix surface that we are using to avoid her nylon allergy. She has no complaints. Wounds are smaller 07/30/17; 2 wounds on the left medial calf/ankle. Larger one is superior. We have been using 3 layer compression with Kerlix as the patient has a nylon allergy. Her wounds are smaller. She is researching stockings without nylon online which will have to be 20-30 mm compression 08/06/17; the inferior wound is fully epithelialized. Superior wound has a small satellite lesion just superior to it. We've been using silver collagen. We are providing the 3 layer compression. The patient tells me she has compression stockings she is ordered off [facility] 08/13/17; both wounds are fully epithelialized. Nice result. The patient has her own compression stockings that are new. She will need to lubricate her skin. READMISSION 07/12/2020 Mrs. Curtner is a 70 year old woman now who has chronic venous disease secondary to factor V Leiden deficiency and a history of DVT She is on chronic s. anticoagulation and although my previous notes suggest Eliquis she says she is actually  on Xarelto. She is not a diabetic. She tells [place] that in early May she took a trip to [place] with family. Almost as  soon as she got out of the car she had inflammation and erythema in the area this did not get any better. She saw her primary doctor and was given ciprofloxacin although she developed some form of reaction to this involving her mouth and lips. She was switched to doxycycline which she is just finishing. She developed an area of breakdown on the left medial lower leg and ankle area. Most of this is already healed. She chronically wears 20/30 below-knee compression stockings although they are old but she did not wear them while she had the infection. 6/24; patient arrives 1 week follow-up. We are using silver alginate under compression wound looks a lot better. 6/29; 1 week follow-up. Not much change although the wounds surfaces still look as though they are smaller. Surrounding slough and eschar. Edema control is good Objective Constitutional Patient is hypertensive.. Pulse regular and within target range for patient.Marland Kitchen Respirations regular, non-labored and within target range.. Temperature is normal and within the target range for the patient.Marland Kitchen Appears in no distress. Vitals Time Taken: 8:25 AM, Height: 67 in, Weight: 244 lbs, BMI: 38.2, Temperature: 97.9 F, Pulse: 56 bpm, Respiratory Rate: 18 breaths/min, Blood Pressure: 152/78 mmHg. General Notes: Wound exam; medial part of the left lower leg. She still has a small wounded area. Eschar dry flaking skin removed with a #3 curette. This cleans up quite nicely. No edema no evidence of infection Integumentary (Hair, Skin) Wound #3 status is Open. Original cause of wound was Gradually Appeared. The date acquired was: 05/27/2020. The wound has been in treatment 1 weeks. The wound is located on the Left,Medial Malleolus. The wound measures 2.5cm length x 0.5cm width x 0.1cm depth; 0.982cm^2 area and 0.098cm^3 volume.  There is Fat Layer (Subcutaneous Tissue) exposed. There is no tunneling or undermining noted. There is a small amount of serous drainage noted. The wound margin is flat and intact. There is large (67-100%) pink granulation within the wound bed. There is a small (1-33%) amount of necrotic tissue within the wound bed including Adherent Slough. Assessment Active Problems ICD-10 Chronic venous hypertension (idiopathic) with ulcer and inflammation of left lower extremity Non-pressure chronic ulcer of other part of left lower leg limited to breakdown of skin Other specified coagulation defects Procedures Wound #3 Pre-procedure diagnosis of Wound #3 is an Infection - not elsewhere classified located on the Left,Medial Malleolus . There was a Excisional Skin/Subcutaneous Tissue Debridement with a total area of 1.25 sq cm performed by Ricard Dillon., MD. With the following instrument(s): Curette to remove Viable and Non-Viable tissue/material. Material removed includes Subcutaneous Tissue and Skin: Epidermis and. No specimens were taken. A time out was conducted at 09:23, prior to the start of the procedure. A Minimum amount of bleeding was controlled with Pressure. The procedure was tolerated well with a pain level of 0 throughout and a pain level of 0 following the procedure. Post Debridement Measurements: 2.5cm length x 0.5cm width x 0.1cm depth; 0.098cm^3 volume. Character of Wound/Ulcer Post Debridement is improved. Post procedure Diagnosis Wound #3: Same as Pre-Procedure Pre-procedure diagnosis of Wound #3 is an Infection - not elsewhere classified located on the Left,Medial Malleolus . There was a Three Layer Compression Therapy Procedure by Levan Hurst, RN. Post procedure Diagnosis Wound #3: Same as Pre-Procedure Plan Follow-up Appointments: Return Appointment in 1 week. - with Dr. Arcola Jansky Shower/ Hygiene: May shower with protection but do not get wound dressing(s) wet. Edema  Control - Lymphedema / SCD / Other: Elevate legs to  the level of the heart or above for 30 minutes daily and/or when sitting, a frequency of: - throughout the day Avoid standing for long periods of time. Moisturize legs daily. Other Edema Control Orders/Instructions: - Obtain a new pair of 20-30 compression stockings Additional Orders / Instructions: Follow Nutritious Diet WOUND #3: - Malleolus Wound Laterality: Left, Medial Cleanser: Soap and Water 1 x Per Week/30 Days Discharge Instructions: May shower and wash wound with dial antibacterial soap and water prior to dressing change. Cleanser: Wound Cleanser 1 x Per Week/30 Days Discharge Instructions: Cleanse the wound with wound cleanser prior to applying a clean dressing using gauze sponges, not tissue or cotton balls. Peri-Wound Care: Triamcinolone 15 (g) 1 x Per Week/30 Days Discharge Instructions: T Wound and Periwound o Peri-Wound Care: Sween Lotion (Moisturizing lotion) 1 x Per Week/30 Days Discharge Instructions: Apply moisturizing lotion as directed Prim Dressing: Hydrofera Blue Ready Foam, 2.5 x2.5 in 1 x Per Week/30 Days ary Discharge Instructions: Apply to wound bed as instructed Secondary Dressing: Woven Gauze Sponge, Non-Sterile 4x4 in 1 x Per Week/30 Days Discharge Instructions: Apply over primary dressing as directed. Com pression Wrap: ThreePress (3 layer compression wrap) 1 x Per Week/30 Days Discharge Instructions: Apply three layer compression as directed. 1. I change the primary dressing to Hydrofera Blue. Hopefully to help with ongoing debridement during the week also antibacterial 2. Continue with 3 layer compression 3. She tells me that she was previously reviewed from a venous point of view by Dr. Drucie Opitz. He did not feel that there was anything that can be done. She has factor V Leiden deficiency and a history of recurrent DVT She had a wound roughly 2 years ago this is a recurrence. She is compliant  with s. compression stockings Electronic Signature(s) Signed: 07/24/2020 4:58:19 PM By: Linton Ham MD Entered By: Linton Ham on 07/24/2020 09:56:58 -------------------------------------------------------------------------------- SuperBill Details Patient Name: Date of Service: [name], Lawernce Ion 07/24/2020 Medical Record Number: [id number] Patient Account Number: [id number] Date of Birth/Sex: Treating RN: 1950/06/04 (70 y.o. Nancy Fetter Primary Care Provider: Hassell Done, [name] Other Clinician: Referring Provider: Treating Provider/Extender: Antonietta Breach, [name] Weeks in Treatment: 1 Diagnosis Coding ICD-10 Codes Code Description [phone] Chronic venous hypertension (idiopathic) with ulcer and inflammation of left lower extremity L97.821 Non-pressure chronic ulcer of other part of left lower leg limited to breakdown of skin D68.8 Other specified coagulation defects Facility Procedures CPT4 Code: [id number] Description: 90240 - DEB SUBQ TISSUE 20 SQ CM/< ICD-10 Diagnosis Description L97.821 Non-pressure chronic ulcer of other part of left lower leg limited to breakdown Modifier: of skin Quantity: 1 Physician Procedures : CPT4 Code Description Modifier [id number] 11042 - WC PHYS SUBQ TISS 20 SQ CM ICD-10 Diagnosis Description L97.821 Non-pressure chronic ulcer of other part of left lower leg limited to breakdown of skin Quantity: 1 Electronic Signature(s) Signed: 07/24/2020 4:58:19 PM By: Linton Ham MD Entered By: Linton Ham on 07/24/2020 09:57:13

## 2020-07-24 NOTE — Progress Notes (Signed)
ROBI, MITTER ([id number]) Visit Report for 07/24/2020 Arrival Information Details Patient Name: Date of Service: NA NCEDimitra, [place] 07/24/2020 8:15 A M Medical Record Number: [id number] Patient Account Number: [id number] Date of Birth/Sex: Treating RN: 1950/06/08 (70 y.o. Nancy Fetter Primary Care Ellery Meroney: Hassell Done, [name] Other Clinician: Referring Lathyn Griggs: Treating Yetta Marceaux/Extender: Antonietta Breach, [name] Weeks in Treatment: 1 Visit Information History Since Last Visit Added or deleted any medications: No Patient Arrived: Ambulatory Any new allergies or adverse reactions: No Arrival Time: 08:24 Had a fall or experienced change in No Accompanied By: husband activities of daily living that may affect Transfer Assistance: None risk of falls: Patient Identification Verified: Yes Signs or symptoms of abuse/neglect since last visito No Secondary Verification Process Completed: Yes Hospitalized since last visit: No Patient Requires Transmission-Based Precautions: No Implantable device outside of the clinic excluding No Patient Has Alerts: Yes cellular tissue based products placed in the center Patient Alerts: Patient on Blood Thinner since last visit: Has Dressing in Place as Prescribed: Yes Pain Present Now: No Electronic Signature(s) Signed: 07/24/2020 10:30:26 AM By: Sandre Kitty Entered By: Sandre Kitty on 07/24/2020 08:25:03 -------------------------------------------------------------------------------- Compression Therapy Details Patient Name: Date of Service: [name], Dianelly L. 07/24/2020 8:15 A M Medical Record Number: [id number] Patient Account Number: [id number] Date of Birth/Sex: Treating RN: [birth year] (70 y.o. Nancy Fetter Primary Care Ita Fritzsche: Hassell Done, [name] Other Clinician: Referring Kiaraliz Rafuse: Treating Amaurie Schreckengost/Extender: Antonietta Breach, [name] Weeks in Treatment: 1 Compression Therapy Performed for  Wound Assessment: Wound #3 Left,Medial Malleolus Performed By: Clinician Levan Hurst, RN Compression Type: Three Layer Post Procedure Diagnosis Same as Pre-procedure Electronic Signature(s) Signed: 07/24/2020 4:56:05 PM By: Levan Hurst RN, BSN Entered By: Levan Hurst on 07/24/2020 09:25:24 -------------------------------------------------------------------------------- Encounter Discharge Information Details Patient Name: Date of Service: [name], Rosario L. 07/24/2020 8:15 A M Medical Record Number: [id number] Patient Account Number: [id number] Date of Birth/Sex: Treating RN: 1950-05-11 (70 y.o. Helene Shoe, Tammi Klippel Primary Care Mihaela Fajardo: Hassell Done, [name] Other Clinician: Referring Renette Hsu: Treating Elide Stalzer/Extender: Antonietta Breach, [name] Weeks in Treatment: 1 Encounter Discharge Information Items Post Procedure Vitals Discharge Condition: Stable Temperature (F): 97.9 Ambulatory Status: Ambulatory Pulse (bpm): 56 Discharge Destination: Home Respiratory Rate (breaths/min): 18 Transportation: Private Auto Blood Pressure (mmHg): 152/78 Accompanied By: self Schedule Follow-up Appointment: Yes Clinical Summary of Care: Electronic Signature(s) Signed: 07/24/2020 6:10:10 PM By: Deon Pilling Entered By: Deon Pilling on 07/24/2020 10:35:23 -------------------------------------------------------------------------------- Lower Extremity Assessment Details Patient Name: Date of Service: NA NCEMerelin, Human 07/24/2020 8:15 A M Medical Record Number: [id number] Patient Account Number: [id number] Date of Birth/Sex: Treating RN: 1950-06-27 (70 y.o. Tonita Phoenix, Lauren Primary Care Chakita Mcgraw: Hassell Done, [name] Other Clinician: Referring Layal Javid: Treating Tayla Panozzo/Extender: Antonietta Breach, [name] Weeks in Treatment: 1 Edema Assessment Assessed: [Left: No] [Right: No] Edema: [Left: N] [Right: o] Calf Left: Right: Point of Measurement: 27  cm From Medial Instep 33 cm Ankle Left: Right: Point of Measurement: 9 cm From Medial Instep 22.9 cm Electronic Signature(s) Signed: 07/24/2020 6:02:58 PM By: Rhae Hammock RN Entered By: Rhae Hammock on 07/24/2020 08:50:48 -------------------------------------------------------------------------------- Multi Wound Chart Details Patient Name: Date of Service: [name], Lawernce Ion. 07/24/2020 8:15 A M Medical Record Number: [id number] Patient Account Number: [id number] Date of Birth/Sex: Treating RN: 11-22-1950 (70 y.o. Nancy Fetter Primary Care Murielle Stang: Hassell Done, [name] Other Clinician: Referring Blake Vetrano: Treating Skarlette Lattner/Extender: Antonietta Breach, [name] Weeks in Treatment: 1 Vital Signs Height(in): 67 Pulse(bpm): 56 Weight(lbs): 244 Blood Pressure(mmHg): 152/78 Body Mass Index(BMI): 38 Temperature(F): 97.9 Respiratory Rate(breaths/min): 18 Photos: [  3:No Photos Left, Medial Malleolus] [N/A:N/A N/A] Wound Location: [3:Gradually Appeared] [N/A:N/A] Wounding Event: [3:Infection - not elsewhere classified N/A] Primary Etiology: [3:Arrhythmia, Deep Vein Thrombosis, N/A] Comorbid History: [3:Hypertension, Received Chemotherapy, Confinement Anxiety 05/27/2020] [N/A:N/A] Date Acquired: [3:1] [N/A:N/A] Weeks of Treatment: [3:Open] [N/A:N/A] Wound Status: [3:2.5x0.5x0.1] [N/A:N/A] Measurements L x W x D (cm) [3:0.982] [N/A:N/A] A (cm) : rea [3:0.098] [N/A:N/A] Volume (cm) : [3:0.00%] [N/A:N/A] % Reduction in A [3:rea: 0.00%] [N/A:N/A] % Reduction in Volume: [3:Full Thickness Without Exposed] [N/A:N/A] Classification: [3:Support Structures Small] [N/A:N/A] Exudate A mount: [3:Serous] [N/A:N/A] Exudate Type: [3:amber] [N/A:N/A] Exudate Color: [3:Flat and Intact] [N/A:N/A] Wound Margin: [3:Large (67-100%)] [N/A:N/A] Granulation A mount: [3:Pink] [N/A:N/A] Granulation Quality: [3:Small (1-33%)] [N/A:N/A] Necrotic A mount: [3:Fat Layer  (Subcutaneous Tissue): Yes N/A] Exposed Structures: [3:Fascia: No Tendon: No Muscle: No Joint: No Bone: No Medium (34-66%)] [N/A:N/A] Epithelialization: [3:Debridement - Excisional] [N/A:N/A] Debridement: Pre-procedure Verification/Time Out 09:23 [N/A:N/A] Taken: [3:Subcutaneous] [N/A:N/A] Tissue Debrided: [3:Skin/Subcutaneous Tissue] [N/A:N/A] Level: [3:1.25] [N/A:N/A] Debridement A (sq cm): [3:rea Curette] [N/A:N/A] Instrument: [3:Minimum] [N/A:N/A] Bleeding: [3:Pressure] [N/A:N/A] Hemostasis A chieved: [3:0] [N/A:N/A] Procedural Pain: [3:0] [N/A:N/A] Post Procedural Pain: [3:Procedure was tolerated well] [N/A:N/A] Debridement Treatment Response: [3:2.5x0.5x0.1] [N/A:N/A] Post Debridement Measurements L x W x D (cm) [3:0.098] [N/A:N/A] Post Debridement Volume: (cm) [3:Compression Therapy] [N/A:N/A] Procedures Performed: [3:Debridement] Treatment Notes Electronic Signature(s) Signed: 07/24/2020 4:56:05 PM By: Levan Hurst RN, BSN Signed: 07/24/2020 4:58:19 PM By: Linton Ham MD Entered By: Linton Ham on 07/24/2020 09:53:38 -------------------------------------------------------------------------------- Multi-Disciplinary Care Plan Details Patient Name: Date of Service: [name], Lawernce Ion. 07/24/2020 8:15 A M Medical Record Number: [id number] Patient Account Number: [id number] Date of Birth/Sex: Treating RN: 08-09-1950 (70 y.o. Nancy Fetter Primary Care Josearmando Kuhnert: Hassell Done, [name] Other Clinician: Referring Renly Roots: Treating Bronson Bressman/Extender: Antonietta Breach, [name] Weeks in Treatment: 1 Active Inactive Wound/Skin Impairment Nursing Diagnoses: Impaired tissue integrity Goals: Patient/caregiver will verbalize understanding of skin care regimen Date Initiated: 07/12/2020 Target Resolution Date: 08/09/2020 Goal Status: Active Ulcer/skin breakdown will have a volume reduction of 30% by week 4 Date Initiated: 07/12/2020 Target Resolution Date:  08/09/2020 Goal Status: Active Interventions: Assess patient/caregiver ability to obtain necessary supplies Assess patient/caregiver ability to perform ulcer/skin care regimen upon admission and as needed Assess ulceration(s) every visit Provide education on ulcer and skin care Treatment Activities: Skin care regimen initiated : 07/12/2020 Topical wound management initiated : 07/12/2020 Notes: Electronic Signature(s) Signed: 07/24/2020 4:56:05 PM By: Levan Hurst RN, BSN Entered By: Levan Hurst on 07/24/2020 09:21:22 -------------------------------------------------------------------------------- Pain Assessment Details Patient Name: Date of Service: [name], Ayleah L. 07/24/2020 8:15 A M Medical Record Number: [id number] Patient Account Number: [id number] Date of Birth/Sex: Treating RN: 1950/10/25 (70 y.o. Nancy Fetter Primary Care Adryan Druckenmiller: Hassell Done, [name] Other Clinician: Referring Gauge Winski: Treating [place] Gopal/Extender: Antonietta Breach, [name] Weeks in Treatment: 1 Active Problems Location of Pain Severity and Description of Pain Patient Has Paino No Site Locations Pain Management and Medication Current Pain Management: Electronic Signature(s) Signed: 07/24/2020 10:30:26 AM By: Sandre Kitty Signed: 07/24/2020 4:56:05 PM By: Levan Hurst RN, BSN Entered By: Sandre Kitty on 07/24/2020 08:25:24 -------------------------------------------------------------------------------- Patient/Caregiver Education Details Patient Name: Date of Service: [name], Lawernce Ion 6/29/2022andnbsp8:15 [place] Record Number: [id number] Patient Account Number: [id number] Date of Birth/Gender: Treating RN: 1950-06-08 (70 y.o. Nancy Fetter Primary Care Physician: Hassell Done, [name] Other Clinician: Referring Physician: Treating Physician/Extender: Antonietta Breach, [name] Weeks in Treatment: 1 Education Assessment Education Provided  To: Patient Education Topics Provided Wound/Skin Impairment: Methods: Explain/Verbal Responses: State content correctly Electronic Signature(s)  Signed: 07/24/2020 4:56:05 PM By: Levan Hurst RN, BSN Entered By: Levan Hurst on 07/24/2020 09:29:10 -------------------------------------------------------------------------------- Wound Assessment Details Patient Name: Date of Service: [name], Yolani L. 07/24/2020 8:15 A M Medical Record Number: [id number] Patient Account Number: [id number] Date of Birth/Sex: Treating RN: 1950/05/14 (70 y.o. Nancy Fetter Primary Care Mariza Bourget: Hassell Done, [name] Other Clinician: Referring Dean Wonder: Treating Irie Dowson/Extender: Antonietta Breach, [name] Weeks in Treatment: 1 Wound Status Wound Number: 3 Primary Infection - not elsewhere classified Etiology: Wound Location: Left, Medial Malleolus Wound Open Wounding Event: Gradually Appeared Status: Date Acquired: 05/27/2020 Comorbid Arrhythmia, Deep Vein Thrombosis, Hypertension, Received Weeks Of Treatment: 1 History: Chemotherapy, Confinement Anxiety Clustered Wound: No Photos Wound Measurements Length: (cm) 2.5 Width: (cm) 0.5 Depth: (cm) 0.1 Area: (cm) 0.982 Volume: (cm) 0.098 % Reduction in Area: 0% % Reduction in Volume: 0% Epithelialization: Medium (34-66%) Tunneling: No Undermining: No Wound Description Classification: Full Thickness Without Exposed Support Structures Wound Margin: Flat and Intact Exudate Amount: Small Exudate Type: Serous Exudate Color: amber Foul Odor After Cleansing: No Slough/Fibrino Yes Wound Bed Granulation Amount: Large (67-100%) Exposed Structure Granulation Quality: Pink Fascia Exposed: No Necrotic Amount: Small (1-33%) Fat Layer (Subcutaneous Tissue) Exposed: Yes Necrotic Quality: Adherent Slough Tendon Exposed: No Muscle Exposed: No Joint Exposed: No Bone Exposed: No Treatment Notes Wound #3 (Malleolus) Wound  Laterality: Left, Medial Cleanser Soap and Water Discharge Instruction: May shower and wash wound with dial antibacterial soap and water prior to dressing change. Wound Cleanser Discharge Instruction: Cleanse the wound with wound cleanser prior to applying a clean dressing using gauze sponges, not tissue or cotton balls. Peri-Wound Care Triamcinolone 15 (g) Discharge Instruction: T Wound and Periwound o Sween Lotion (Moisturizing lotion) Discharge Instruction: Apply moisturizing lotion as directed Topical Primary Dressing Hydrofera Blue Ready Foam, 2.5 x2.5 in Discharge Instruction: Apply to wound bed as instructed Secondary Dressing Woven Gauze Sponge, Non-Sterile 4x4 in Discharge Instruction: Apply over primary dressing as directed. Secured With Compression Wrap ThreePress (3 layer compression wrap) Discharge Instruction: Apply three layer compression as directed. Compression Stockings Add-Ons Electronic Signature(s) Signed: 07/24/2020 4:56:05 PM By: Levan Hurst RN, BSN Signed: 07/24/2020 5:02:46 PM By: Sandre Kitty Entered By: Sandre Kitty on 07/24/2020 16:29:06 -------------------------------------------------------------------------------- Vitals Details Patient Name: Date of Service: [name], Gentry L. 07/24/2020 8:15 A M Medical Record Number: [id number] Patient Account Number: [id number] Date of Birth/Sex: Treating RN: [birth year] (70 y.o. Nancy Fetter Primary Care Jakin Pavao: Hassell Done, [name] Other Clinician: Referring Snyder Colavito: Treating Kathern Lobosco/Extender: Antonietta Breach, [name] Weeks in Treatment: 1 Vital Signs Time Taken: 08:25 Temperature (F): 97.9 Height (in): 67 Pulse (bpm): 56 Weight (lbs): 244 Respiratory Rate (breaths/min): 18 Body Mass Index (BMI): 38.2 Blood Pressure (mmHg): 152/78 Reference Range: 80 - 120 mg / dl Electronic Signature(s) Signed: 07/24/2020 10:30:26 AM By: Sandre Kitty Entered By: Sandre Kitty on 07/24/2020 08:25:18

## 2020-07-31 ENCOUNTER — Encounter (HOSPITAL_BASED_OUTPATIENT_CLINIC_OR_DEPARTMENT_OTHER): Payer: PPO | Attending: Internal Medicine | Admitting: Internal Medicine

## 2020-07-31 ENCOUNTER — Other Ambulatory Visit: Payer: Self-pay

## 2020-07-31 DIAGNOSIS — Z7901 Long term (current) use of anticoagulants: Secondary | ICD-10-CM | POA: Insufficient documentation

## 2020-07-31 DIAGNOSIS — Z86718 Personal history of other venous thrombosis and embolism: Secondary | ICD-10-CM | POA: Insufficient documentation

## 2020-07-31 DIAGNOSIS — D688 Other specified coagulation defects: Secondary | ICD-10-CM | POA: Diagnosis not present

## 2020-07-31 DIAGNOSIS — L0889 Other specified local infections of the skin and subcutaneous tissue: Secondary | ICD-10-CM | POA: Diagnosis not present

## 2020-07-31 DIAGNOSIS — I890 Lymphedema, not elsewhere classified: Secondary | ICD-10-CM | POA: Insufficient documentation

## 2020-07-31 DIAGNOSIS — L97821 Non-pressure chronic ulcer of other part of left lower leg limited to breakdown of skin: Secondary | ICD-10-CM | POA: Diagnosis not present

## 2020-07-31 DIAGNOSIS — L97322 Non-pressure chronic ulcer of left ankle with fat layer exposed: Secondary | ICD-10-CM | POA: Diagnosis not present

## 2020-07-31 DIAGNOSIS — I87332 Chronic venous hypertension (idiopathic) with ulcer and inflammation of left lower extremity: Secondary | ICD-10-CM | POA: Diagnosis not present

## 2020-07-31 DIAGNOSIS — I872 Venous insufficiency (chronic) (peripheral): Secondary | ICD-10-CM | POA: Insufficient documentation

## 2020-07-31 NOTE — Progress Notes (Signed)
[name], [name] ([id number]) Visit Report for 07/31/2020 Debridement Details Patient Name: Date of Service: [name], [name] 07/31/2020 8:00 A M Medical Record Number: [id number] Patient Account Number: [id number] Date of Birth/Sex: Treating RN: 1950-08-27 (70 y.o. [name] Primary Care Provider: Hassell Done, [name] Other Clinician: Referring Provider: Treating Provider/Extender: Antonietta Breach, [name] Weeks in Treatment: 2 Debridement Performed for Assessment: Wound #3 Left,Medial Malleolus Performed By: Physician Ricard Dillon., MD Debridement Type: Debridement Level of Consciousness (Pre-procedure): Awake and Alert Pre-procedure Verification/Time Out Yes - 09:04 Taken: Start Time: 09:04 T Area Debrided (L x W): otal 0.6 (cm) x 0.5 (cm) = 0.3 (cm) Tissue and other material debrided: Non-Viable, Eschar, Skin: Epidermis Level: Skin/Epidermis Debridement Description: Selective/Open Wound Instrument: Curette Bleeding: None End Time: 09:05 Procedural Pain: 0 Post Procedural Pain: 0 Response to Treatment: Procedure was tolerated well Level of Consciousness (Post- Awake and Alert procedure): Post Debridement Measurements of Total Wound Length: (cm) 0.6 Width: (cm) 0.5 Depth: (cm) 0.1 Volume: (cm) 0.024 Character of Wound/Ulcer Post Debridement: Improved Post Procedure Diagnosis Same as Pre-procedure Electronic Signature(s) Signed: 07/31/2020 5:13:41 PM By: Linton Ham MD Signed: 07/31/2020 7:11:13 PM By: Levan Hurst RN, BSN Entered By: Linton Ham on 07/31/2020 09:45:02 -------------------------------------------------------------------------------- HPI Details Patient Name: Date of Service: [name], [name] 07/31/2020 8:00 A M Medical Record Number: [id number] Patient Account Number: [id number] Date of Birth/Sex: Treating RN: 04/02/1950 (70 y.o. [name] Primary Care Provider: Hassell Done, [name] Other Clinician: Referring  Provider: Treating Provider/Extender: Antonietta Breach, [name] Weeks in Treatment: 2 History of Present Illness HPI Description: ADMISSION 07/12/17 This is a 70 year old woman who lives in [place]. She is a nondiabetic nonsmoker but she has factor V Leiden deficiency and a history of DVT in the left leg. She is on chronic anticoagulation with Coumadin more recently switched to Xarelto. 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 she had a wound in 2014 in the same area and was seen in the wound care clinic I think in [place] and the wound closed. Patient is had a history of a wound on the left medial ankle since October 2018. She was seen by her primary physician in January 2009 with the same venous ulcer. Was given [facility]. She was not followed up until May of this year at an urgent care at [facility]. She was felt to have cellulitis and given clindamycin. She was seen in the same urgent care on 07/06/17 also felt to have refractory cellulitis and the clindamycin was extended although the 2 days later she developed abdominal pain without diarrhea she stop the clindamycin and the pain resolved. She is simply washing this off with soap and water. She has not had venous reflux studies however she has had several ultrasounds of the left leg most recently in July 2016. This was done due to chronic left lower extremity pain and swelling. The result was that the left femoral vein remains atretic with sequela of prior DVT She has stable chronic deep venous . thrombosis in the left popliteal vein. No new thrombosis is noted. Superficial varicose varicosities noted in the left ankle. Beside the factor V Leiden the patient has a history of a choose one tumor as noted. Hypertension ventricular tachycardia B12 deficiency and anxiety. She has a history of allergy to nylon which she states is precluded wearing  compression stockings although she has worn TED hose that have  not cause any skin breakdown. She is not currently wearing these. ABI in our clinic was 0.91 on the left 07/16/17; 2 wounds on theleft medial calf/ankle. These are reminiscence once larger wound. The dimensions are smaller. Surface of the wound looks healthy. The patient has no specific complaints of pain or drainage. She is allergic to nylon but handled R3 layer compression with a Kerlix contact layer well. No burning no itching no skin irritation 07/23/17; 2 wounds on the left medial calf/ankle. Patient has no specific complaints to the 3 layer with Kerlix surface that we are using to avoid her nylon allergy. She has no complaints. Wounds are smaller 07/30/17; 2 wounds on the left medial calf/ankle. Larger one is superior. We have been using 3 layer compression with Kerlix as the patient has a nylon allergy. Her wounds are smaller. She is researching stockings without nylon online which will have to be 20-30 mm compression 08/06/17; the inferior wound is fully epithelialized. Superior wound has a small satellite lesion just superior to it. We've been using silver collagen. We are providing the 3 layer compression. The patient tells me she has compression stockings she is ordered off [place] 08/13/17; both wounds are fully epithelialized. Nice result. The patient has her own compression stockings that are new. She will need to lubricate her skin. READMISSION 07/12/2020 [name] is a 70 year old woman now who has chronic venous disease secondary to factor V Leiden deficiency and a history of DVT She is on chronic s. anticoagulation and although my previous notes suggest Eliquis she says she is actually on Xarelto. She is not a diabetic. She tells [place] that in early May she took a trip to [place] with family. Almost as soon as she got out of the car she had inflammation and erythema in the area this did not get any better. She  saw her primary doctor and was given ciprofloxacin although she developed some form of reaction to this involving her mouth and lips. She was switched to doxycycline which she is just finishing. She developed an area of breakdown on the left medial lower leg and ankle area. Most of this is already healed. She chronically wears 20/30 below-knee compression stockings although they are old but she did not wear them while she had the infection. 6/24; patient arrives 1 week follow-up. We are using silver alginate under compression wound looks a lot better. 6/29; 1 week follow-up. Not much change although the wounds surfaces still look as though they are smaller. Surrounding slough and eschar. Edema control is good 7/6; 1 week follow-up. Only a very small open area remains here. She has dry flaking skin and eschar over the entire surface of this area which she says she has been dealing with for some time. I am not totally sure of the issue here. After removal of this there is only small areas remaining. She has compression stockings which she wears every now and then. Electronic Signature(s) Signed: 07/31/2020 5:13:41 PM By: Linton Ham MD Entered By: Linton Ham on 07/31/2020 09:45:57 -------------------------------------------------------------------------------- Physical Exam Details Patient Name: Date of Service: [name], [name] 07/31/2020 8:00 A M Medical Record Number: [id number] Patient Account Number: [id number] Date of Birth/Sex: Treating RN: 1950/08/08 (70 y.o. [name] Primary Care Provider: Hassell Done, [name] Other Clinician: Referring Provider: Treating Provider/Extender: Antonietta Breach, [name] Weeks in Treatment: 2 Constitutional Patient is hypertensive.. Pulse regular and within target range for patient.Marland Kitchen Respirations regular, non-labored and within target range.. Temperature is normal  and within the target range for the patient.Marland Kitchen Appears in no  distress. Notes Wound exam; medial part of the left lower leg. After removal of callus dry skin there is still too friable remaining open areas this is just about closed. She has considerable amount of hemosiderin deposition in the area. Lipodermatosclerosis. She has edema in the proximal one third of her leg but no evidence of an acute DVT Pedal pulses are palpable, I do not think this is an issue . Electronic Signature(s) Signed: 07/31/2020 5:13:41 PM By: Linton Ham MD Signed: 07/31/2020 5:13:41 PM By: Linton Ham MD Entered By: Linton Ham on 07/31/2020 09:46:59 -------------------------------------------------------------------------------- Physician Orders Details Patient Name: Date of Service: [name], [name] 07/31/2020 8:00 A M Medical Record Number: [id number] Patient Account Number: [id number] Date of Birth/Sex: Treating RN: 1950/03/12 (70 y.o. [name] Primary Care Provider: Hassell Done, [name] Other Clinician: Referring Provider: Treating Provider/Extender: Antonietta Breach, [name] Weeks in Treatment: 2 Verbal / Phone Orders: No Diagnosis Coding ICD-10 Coding Code Description [id number] Chronic venous hypertension (idiopathic) with ulcer and inflammation of left lower extremity L97.821 Non-pressure chronic ulcer of other part of left lower leg limited to breakdown of skin D68.8 Other specified coagulation defects Follow-up Appointments ppointment in 1 week. - with Dr. Dellia Nims Return A Bathing/ Shower/ Hygiene May shower with protection but do not get wound dressing(s) wet. Edema Control - Lymphedema / SCD / Other Elevate legs to the level of the heart or above for 30 minutes daily and/or when sitting, a frequency of: - throughout the day Avoid standing for long periods of time. Moisturize legs daily. Other Edema Control Orders/Instructions: - Obtain a new pair of 20-30 compression stockings Additional Orders / Instructions Follow  Nutritious Diet Wound Treatment Wound #3 - Malleolus Wound Laterality: Left, Medial Cleanser: Soap and Water 1 x Per Week/30 Days Discharge Instructions: May shower and wash wound with dial antibacterial soap and water prior to dressing change. Cleanser: Wound Cleanser 1 x Per Week/30 Days Discharge Instructions: Cleanse the wound with wound cleanser prior to applying a clean dressing using gauze sponges, not tissue or cotton balls. Peri-Wound Care: Triamcinolone 15 (g) 1 x Per Week/30 Days Discharge Instructions: T Wound and Periwound o Peri-Wound Care: Sween Lotion (Moisturizing lotion) 1 x Per Week/30 Days Discharge Instructions: Apply moisturizing lotion as directed Prim Dressing: Hydrofera Blue Ready Foam, 2.5 x2.5 in 1 x Per Week/30 Days ary Discharge Instructions: Apply to wound bed as instructed Secondary Dressing: Woven Gauze Sponge, Non-Sterile 4x4 in 1 x Per Week/30 Days Discharge Instructions: Apply over primary dressing as directed. Compression Wrap: ThreePress (3 layer compression wrap) 1 x Per Week/30 Days Discharge Instructions: Apply three layer compression as directed. Electronic Signature(s) Signed: 07/31/2020 5:13:41 PM By: Linton Ham MD Signed: 07/31/2020 7:11:13 PM By: Levan Hurst RN, BSN Entered By: Levan Hurst on 07/31/2020 09:03:08 -------------------------------------------------------------------------------- Problem List Details Patient Name: Date of Service: [name], [name] 07/31/2020 8:00 A M Medical Record Number: [id number] Patient Account Number: [id number] Date of Birth/Sex: Treating RN: Oct 31, 1950 (70 y.o. [name] Primary Care Provider: Hassell Done, [name] Other Clinician: Referring Provider: Treating Provider/Extender: Antonietta Breach, [name] Weeks in Treatment: 2 Active Problems ICD-10 Encounter Code Description Active Date MDM Diagnosis I87.332 Chronic venous hypertension (idiopathic) with ulcer and  inflammation of left 07/12/2020 No Yes lower extremity L97.821 Non-pressure chronic ulcer of other part of left lower leg limited to breakdown 07/12/2020 No Yes of skin D68.8 Other specified coagulation defects 07/12/2020 No Yes Inactive Problems  Resolved Problems Electronic Signature(s) Signed: 07/31/2020 5:13:41 PM By: Linton Ham MD Entered By: Linton Ham on 07/31/2020 09:44:40 -------------------------------------------------------------------------------- Progress Note Details Patient Name: Date of Service: [name], [name] 07/31/2020 8:00 A M Medical Record Number: [id number] Patient Account Number: [id number] Date of Birth/Sex: Treating RN: 1950/06/14 (70 y.o. [name] Primary Care Provider: Hassell Done, [name] Other Clinician: Referring Provider: Treating Provider/Extender: Antonietta Breach, [name] Weeks in Treatment: 2 Subjective History of Present Illness (HPI) ADMISSION 07/12/17 This is a 70 year old woman who lives in [place]. She is a nondiabetic nonsmoker but she has factor V Leiden deficiency and a history of DVT in the left leg. She is on chronic anticoagulation with Coumadin more recently switched to Xarelto. 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 she had a wound in 2014 in the same area and was seen in the wound care clinic I think in [place] and the wound closed. Patient is had a history of a wound on the left medial ankle since October 2018. She was seen by her primary physician in January 2009 with the same venous ulcer. Was given [facility]. She was not followed up until May of this year at an urgent care at [facility]. She was felt to have cellulitis and given clindamycin. She was seen in the same urgent care on 07/06/17 also felt to have refractory cellulitis and the clindamycin was extended although the 2 days later she developed abdominal pain  without diarrhea she stop the clindamycin and the pain resolved. She is simply washing this off with soap and water. She has not had venous reflux studies however she has had several ultrasounds of the left leg most recently in July 2016. This was done due to chronic left lower extremity pain and swelling. The result was that the left femoral vein remains atretic with sequela of prior DVT She has stable chronic deep venous . thrombosis in the left popliteal vein. No new thrombosis is noted. Superficial varicose varicosities noted in the left ankle. Beside the factor V Leiden the patient has a history of a choose one tumor as noted. Hypertension ventricular tachycardia B12 deficiency and anxiety. She has a history of allergy to nylon which she states is precluded wearing compression stockings although she has worn TED hose that have not cause any skin breakdown. She is not currently wearing these. ABI in our clinic was 0.91 on the left 07/16/17; 2 wounds on theleft medial calf/ankle. These are reminiscence once larger wound. The dimensions are smaller. Surface of the wound looks healthy. The patient has no specific complaints of pain or drainage. She is allergic to nylon but handled R3 layer compression with a Kerlix contact layer well. No burning no itching no skin irritation 07/23/17; 2 wounds on the left medial calf/ankle. Patient has no specific complaints to the 3 layer with Kerlix surface that we are using to avoid her nylon allergy. She has no complaints. Wounds are smaller 07/30/17; 2 wounds on the left medial calf/ankle. Larger one is superior. We have been using 3 layer compression with Kerlix as the patient has a nylon allergy. Her wounds are smaller. She is researching stockings without nylon online which will have to be 20-30 mm compression 08/06/17; the inferior wound is fully epithelialized. Superior wound has a small satellite lesion just superior to it. We've been using silver collagen. We  are providing the 3 layer compression. The patient  tells me she has compression stockings she is ordered off [facility] 08/13/17; both wounds are fully epithelialized. Nice result. The patient has her own compression stockings that are new. She will need to lubricate her skin. READMISSION 07/12/2020 Mrs. Wootan is a 70 year old woman now who has chronic venous disease secondary to factor V Leiden deficiency and a history of DVT She is on chronic s. anticoagulation and although my previous notes suggest Eliquis she says she is actually on Xarelto. She is not a diabetic. She tells [place] that in early May she took a trip to [place] with family. Almost as soon as she got out of the car she had inflammation and erythema in the area this did not get any better. She saw her primary doctor and was given ciprofloxacin although she developed some form of reaction to this involving her mouth and lips. She was switched to doxycycline which she is just finishing. She developed an area of breakdown on the left medial lower leg and ankle area. Most of this is already healed. She chronically wears 20/30 below-knee compression stockings although they are old but she did not wear them while she had the infection. 6/24; patient arrives 1 week follow-up. We are using silver alginate under compression wound looks a lot better. 6/29; 1 week follow-up. Not much change although the wounds surfaces still look as though they are smaller. Surrounding slough and eschar. Edema control is good 7/6; 1 week follow-up. Only a very small open area remains here. She has dry flaking skin and eschar over the entire surface of this area which she says she has been dealing with for some time. I am not totally sure of the issue here. After removal of this there is only small areas remaining. She has compression stockings which she wears every now and then. Objective Constitutional Patient is hypertensive.. Pulse regular and  within target range for patient.Marland Kitchen Respirations regular, non-labored and within target range.. Temperature is normal and within the target range for the patient.Marland Kitchen Appears in no distress. Vitals Time Taken: 7:56 AM, Height: 67 in, Weight: 244 lbs, BMI: 38.2, Temperature: 98.1 F, Pulse: 66 bpm, Respiratory Rate: 16 breaths/min, Blood Pressure: 145/78 mmHg. General Notes: Wound exam; medial part of the left lower leg. After removal of callus dry skin there is still too friable remaining open areas this is just about closed. She has considerable amount of hemosiderin deposition in the area. Lipodermatosclerosis. She has edema in the proximal one third of her leg but no evidence of an acute DVT Pedal pulses are palpable, I do not think this is an issue . Integumentary (Hair, Skin) Wound #3 status is Open. Original cause of wound was Gradually Appeared. The date acquired was: 05/27/2020. The wound has been in treatment 2 weeks. The wound is located on the Left,Medial Malleolus. The wound measures 0.6cm length x 0.5cm width x 0.1cm depth; 0.236cm^2 area and 0.024cm^3 volume. There is Fat Layer (Subcutaneous Tissue) exposed. There is no tunneling or undermining noted. There is a medium amount of serosanguineous drainage noted. The wound margin is flat and intact. There is large (67-100%) red, pink granulation within the wound bed. There is no necrotic tissue within the wound bed. Assessment Active Problems ICD-10 Chronic venous hypertension (idiopathic) with ulcer and inflammation of left lower extremity Non-pressure chronic ulcer of other part of left lower leg limited to breakdown of skin Other specified coagulation defects Procedures Wound #3 Pre-procedure diagnosis of Wound #3 is an Infection - not elsewhere  classified located on the Left,Medial Malleolus . There was a Selective/Open Wound Skin/Epidermis Debridement with a total area of 0.3 sq cm performed by Ricard Dillon., MD. With the  following instrument(s): Curette to remove Non- Viable tissue/material. Material removed includes Eschar and Skin: Epidermis and. No specimens were taken. A time out was conducted at 09:04, prior to the start of the procedure. There was no bleeding. The procedure was tolerated well with a pain level of 0 throughout and a pain level of 0 following the procedure. Post Debridement Measurements: 0.6cm length x 0.5cm width x 0.1cm depth; 0.024cm^3 volume. Character of Wound/Ulcer Post Debridement is improved. Post procedure Diagnosis Wound #3: Same as Pre-Procedure Pre-procedure diagnosis of Wound #3 is an Infection - not elsewhere classified located on the Left,Medial Malleolus . There was a Three Layer Compression Therapy Procedure by Levan Hurst, RN. Post procedure Diagnosis Wound #3: Same as Pre-Procedure Plan Follow-up Appointments: Return Appointment in 1 week. - with Dr. Arcola Jansky Shower/ Hygiene: May shower with protection but do not get wound dressing(s) wet. Edema Control - Lymphedema / SCD / Other: Elevate legs to the level of the heart or above for 30 minutes daily and/or when sitting, a frequency of: - throughout the day Avoid standing for long periods of time. Moisturize legs daily. Other Edema Control Orders/Instructions: - Obtain a new pair of 20-30 compression stockings Additional Orders / Instructions: Follow Nutritious Diet WOUND #3: - Malleolus Wound Laterality: Left, Medial Cleanser: Soap and Water 1 x Per Week/30 Days Discharge Instructions: May shower and wash wound with dial antibacterial soap and water prior to dressing change. Cleanser: Wound Cleanser 1 x Per Week/30 Days Discharge Instructions: Cleanse the wound with wound cleanser prior to applying a clean dressing using gauze sponges, not tissue or cotton balls. Peri-Wound Care: Triamcinolone 15 (g) 1 x Per Week/30 Days Discharge Instructions: T Wound and Periwound o Peri-Wound Care: Sween Lotion  (Moisturizing lotion) 1 x Per Week/30 Days Discharge Instructions: Apply moisturizing lotion as directed Prim Dressing: Hydrofera Blue Ready Foam, 2.5 x2.5 in 1 x Per Week/30 Days ary Discharge Instructions: Apply to wound bed as instructed Secondary Dressing: Woven Gauze Sponge, Non-Sterile 4x4 in 1 x Per Week/30 Days Discharge Instructions: Apply over primary dressing as directed. Com pression Wrap: ThreePress (3 layer compression wrap) 1 x Per Week/30 Days Discharge Instructions: Apply three layer compression as directed. 1. I continued with the [facility] that I started next week. This really seems to have helped 2. Still in compression. She is has her compression stockings and waiting. 3. I will go over the edema in the proximal part of her calf vis--vis the genesis of wounds in her lower part of her leg even though there is "no edema". She has very marked skin damage in this area. Last breakdown however was 2 years ago 4. She showed me an area between her left second and third toes that she is already been to see her primary doctor for. He apparently diagnosed tinea and gave her a topical cream that she has been using. Most of this area is epithelialized I did not define this today. Did not see anything that looked definitively tinea however. Advised her to keep her toes separated perhaps not wear flip-flops Electronic Signature(s) Signed: 07/31/2020 5:13:41 PM By: Linton Ham MD Entered By: Linton Ham on 07/31/2020 09:48:36 -------------------------------------------------------------------------------- SuperBill Details Patient Name: Date of Service: [name], [name] 07/31/2020 Medical Record Number: [id number] Patient Account Number: [id number] Date of Birth/Sex: Treating  RN: November 19, 1950 (70 y.o. [name] Primary Care Provider: Hassell Done, [name] Other Clinician: Referring Provider: Treating Provider/Extender: Antonietta Breach, [name] Weeks in  Treatment: 2 Diagnosis Coding ICD-10 Codes Code Description [phone] Chronic venous hypertension (idiopathic) with ulcer and inflammation of left lower extremity L97.821 Non-pressure chronic ulcer of other part of left lower leg limited to breakdown of skin D68.8 Other specified coagulation defects Facility Procedures CPT4 Code: [id number] Description: [phone] - DEBRIDE WOUND 1ST 20 SQ CM OR < ICD-10 Diagnosis Description L97.821 Non-pressure chronic ulcer of other part of left lower leg limited to breakdown o Modifier: f skin Quantity: 1 Physician Procedures : CPT4 Code Description Modifier [id number] - WC PHYS DEBR WO ANESTH 20 SQ CM ICD-10 Diagnosis Description L97.821 Non-pressure chronic ulcer of other part of left lower leg limited to breakdown of skin Quantity: 1 Electronic Signature(s) Signed: 07/31/2020 5:13:41 PM By: Linton Ham MD Entered By: Linton Ham on 07/31/2020 09:48:51

## 2020-07-31 NOTE — Progress Notes (Signed)
[name], [name] ([id number]) Visit Report for 07/31/2020 Arrival Information Details Patient Name: Date of Service: [name], [name] 07/31/2020 8:00 A M Medical Record Number: [id number] Patient Account Number: [id number] Date of Birth/Sex: Treating RN: 12/02/1950 (70 y.o. [name] Primary Care Arlayne Liggins: Hassell Done, [name] Other Clinician: Referring Tamilyn Lupien: Treating Eamon Tantillo/Extender: Antonietta Breach, [name] Weeks in Treatment: 2 Visit Information History Since Last Visit All ordered tests and consults were completed: No Patient Arrived: Ambulatory Added or deleted any medications: No Arrival Time: 07:56 Any new allergies or adverse reactions: No Accompanied By: spouse Had a fall or experienced change in No Transfer Assistance: None activities of daily living that may affect Patient Identification Verified: Yes risk of falls: Secondary Verification Process Completed: Yes Signs or symptoms of abuse/neglect since last visito No Patient Requires Transmission-Based Precautions: No Hospitalized since last visit: No Patient Has Alerts: Yes Implantable device outside of the clinic excluding No Patient Alerts: Patient on Blood Thinner cellular tissue based products placed in the center since last visit: Has Dressing in Place as Prescribed: Yes Has Compression in Place as Prescribed: Yes Pain Present Now: No Electronic Signature(s) Signed: 07/31/2020 5:07:06 PM By: Leane Call Entered By: Leane Call on 07/31/2020 07:56:53 -------------------------------------------------------------------------------- Compression Therapy Details Patient Name: Date of Service: [name], [name] 07/31/2020 8:00 A M Medical Record Number: [id number] Patient Account Number: [id number] Date of Birth/Sex: Treating RN: 22-Oct-1950 (70 y.o. [name] Primary Care Tahji [place]: Hassell Done, [name] Other Clinician: Referring Emmelia Holdsworth: Treating Chanda Laperle/Extender:  Antonietta Breach, [name] Weeks in Treatment: 2 Compression Therapy Performed for Wound Assessment: Wound #3 Left,Medial Malleolus Performed By: Clinician Levan Hurst, RN Compression Type: Three Layer Post Procedure Diagnosis Same as Pre-procedure Electronic Signature(s) Signed: 07/31/2020 7:11:13 PM By: Levan Hurst RN, BSN Entered By: Levan Hurst on 07/31/2020 09:06:37 -------------------------------------------------------------------------------- Encounter Discharge Information Details Patient Name: Date of Service: [name], [name] 07/31/2020 8:00 A M Medical Record Number: [id number] Patient Account Number: [id number] Date of Birth/Sex: Treating RN: [birth year] (70 y.o. [name], [name] Primary Care Vivianna Piccini: Hassell Done, [name] Other Clinician: Referring Kristof Nadeem: Treating Derric Dealmeida/Extender: Antonietta Breach, [name] Weeks in Treatment: 2 Encounter Discharge Information Items Post Procedure Vitals Discharge Condition: Stable Temperature (F): 98.1 Ambulatory Status: Ambulatory Pulse (bpm): 66 Discharge Destination: Home Respiratory Rate (breaths/min): 16 Transportation: Private Auto Blood Pressure (mmHg): 145/78 Accompanied By: self Schedule Follow-up Appointment: Yes Clinical Summary of Care: Electronic Signature(s) Signed: 07/31/2020 6:50:03 PM By: Deon Pilling Entered By: Deon Pilling on 07/31/2020 16:26:39 -------------------------------------------------------------------------------- Lower Extremity Assessment Details Patient Name: Date of Service: [name], [name] 07/31/2020 8:00 A M Medical Record Number: [id number] Patient Account Number: [id number] Date of Birth/Sex: Treating RN: 03/08/1950 (70 y.o. [name] Primary Care Jaydalee Bardwell: Hassell Done, [name] Other Clinician: Referring Aila Terra: Treating Elyzabeth Goatley/Extender: Antonietta Breach, [name] Weeks in Treatment: 2 Edema Assessment Assessed:  [Left: No] [Right: No] Edema: [Left: N] [Right: o] Calf Left: Right: Point of Measurement: 27 cm From Medial Instep 33 cm Ankle Left: Right: Point of Measurement: 9 cm From Medial Instep 22.5 cm Vascular Assessment Pulses: Dorsalis Pedis Palpable: [Left:Yes] Electronic Signature(s) Signed: 07/31/2020 5:07:06 PM By: Leane Call Entered By: Leane Call on 07/31/2020 08:00:55 -------------------------------------------------------------------------------- Multi Wound Chart Details Patient Name: Date of Service: [name], [name] 07/31/2020 8:00 A M Medical Record Number: [id number] Patient Account Number: [id number] Date of Birth/Sex: Treating RN: Nov 26, 1950 (70 y.o. [name] Primary Care Yaw Escoto: Hassell Done, [name] Other Clinician: Referring Barnet Benavides: Treating Johanthan Kneeland/Extender: Antonietta Breach, [name] Weeks in Treatment: 2  Vital Signs Height(in): 67 Pulse(bpm): 66 Weight(lbs): 244 Blood Pressure(mmHg): 145/78 Body Mass Index(BMI): 38 Temperature(F): 98.1 Respiratory Rate(breaths/min): 16 Photos: [3:No Photos Left, Medial Malleolus] [N/A:N/A N/A] Wound Location: [3:Gradually Appeared] [N/A:N/A] Wounding Event: [3:Infection - not elsewhere classified N/A] Primary Etiology: [3:Arrhythmia, Deep Vein Thrombosis, N/A] Comorbid History: [3:Hypertension, Received Chemotherapy, Confinement Anxiety 05/27/2020] [N/A:N/A] Date Acquired: [3:2] [N/A:N/A] Weeks of Treatment: [3:Open] [N/A:N/A] Wound Status: [3:0.6x0.5x0.1] [N/A:N/A] Measurements L x W x D (cm) [3:0.236] [N/A:N/A] A (cm) : rea [3:0.024] [N/A:N/A] Volume (cm) : [3:76.00%] [N/A:N/A] % Reduction in A [3:rea: 75.50%] [N/A:N/A] % Reduction in Volume: [3:Full Thickness Without Exposed] [N/A:N/A] Classification: [3:Support Structures Medium] [N/A:N/A] Exudate A mount: [3:Serosanguineous] [N/A:N/A] Exudate Type: [3:red, brown] [N/A:N/A] Exudate Color: [3:Flat and Intact]  [N/A:N/A] Wound Margin: [3:Large (67-100%)] [N/A:N/A] Granulation A mount: [3:Red, Pink] [N/A:N/A] Granulation Quality: [3:None Present (0%)] [N/A:N/A] Necrotic A mount: [3:Fat Layer (Subcutaneous Tissue): Yes N/A] Exposed Structures: [3:Fascia: No Tendon: No Muscle: No Joint: No Bone: No Medium (34-66%)] [N/A:N/A] Epithelialization: [3:Debridement - Selective/Open Wound N/A] Debridement: Pre-procedure Verification/Time Out 09:04 [N/A:N/A] Taken: [3:Necrotic/Eschar] [N/A:N/A] Tissue Debrided: [3:Skin/Epidermis] [N/A:N/A] Level: [3:0.3] [N/A:N/A] Debridement A (sq cm): [3:rea Curette] [N/A:N/A] Instrument: [3:None] [N/A:N/A] Bleeding: [3:0] [N/A:N/A] Procedural Pain: [3:0] [N/A:N/A] Post Procedural Pain: [3:Procedure was tolerated well] [N/A:N/A] Debridement Treatment Response: [3:0.6x0.5x0.1] [N/A:N/A] Post Debridement Measurements L x W x D (cm) [3:0.024] [N/A:N/A] Post Debridement Volume: (cm) [3:Compression Therapy] [N/A:N/A] Procedures Performed: [3:Debridement] Treatment Notes Electronic Signature(s) Signed: 07/31/2020 5:13:41 PM By: Linton Ham MD Signed: 07/31/2020 7:11:13 PM By: Levan Hurst RN, BSN Entered By: Linton Ham on 07/31/2020 09:44:47 -------------------------------------------------------------------------------- Multi-Disciplinary Care Plan Details Patient Name: Date of Service: [name], [name] 07/31/2020 8:00 A M Medical Record Number: [id number] Patient Account Number: [id number] Date of Birth/Sex: Treating RN: 1951/01/23 (70 y.o. [name] Primary Care Gabryelle Whitmoyer: Hassell Done, [name] Other Clinician: Referring Ramces Shomaker: Treating Ebbie Sorenson/Extender: Antonietta Breach, [name] Weeks in Treatment: 2 Active Inactive Wound/Skin Impairment Nursing Diagnoses: Impaired tissue integrity Goals: Patient/caregiver will verbalize understanding of skin care regimen Date Initiated: 07/12/2020 Target Resolution Date: 08/23/2020 Goal  Status: Active Ulcer/skin breakdown will have a volume reduction of 30% by week 4 Date Initiated: 07/12/2020 Target Resolution Date: 08/23/2020 Goal Status: Active Interventions: Assess patient/caregiver ability to obtain necessary supplies Assess patient/caregiver ability to perform ulcer/skin care regimen upon admission and as needed Assess ulceration(s) every visit Provide education on ulcer and skin care Treatment Activities: Skin care regimen initiated : 07/12/2020 Topical wound management initiated : 07/12/2020 Notes: Electronic Signature(s) Signed: 07/31/2020 7:11:13 PM By: Levan Hurst RN, BSN Entered By: Levan Hurst on 07/31/2020 09:03:34 -------------------------------------------------------------------------------- Pain Assessment Details Patient Name: Date of Service: [name], [name] 07/31/2020 8:00 A M Medical Record Number: [id number] Patient Account Number: [id number] Date of Birth/Sex: Treating RN: 05/08/1950 (70 y.o. [name] Primary Care Arista Kettlewell: Hassell Done, [name] Other Clinician: Referring Chazlyn Cude: Treating Ennio Houp/Extender: Antonietta Breach, [name] Weeks in Treatment: 2 Active Problems Location of Pain Severity and Description of Pain Patient Has Paino No Site Locations Rate the pain. Rate the pain. Current Pain Level: 0 Character of Pain Describe the Pain: Cramping Pain Management and Medication Current Pain Management: Notes c/o cramping in left calf region - denies it being pain - just cramping Electronic Signature(s) Signed: 07/31/2020 5:07:06 PM By: Leane Call Entered By: Leane Call on 07/31/2020 07:58:21 -------------------------------------------------------------------------------- Patient/Caregiver Education Details Patient Name: Date of Service: [name], [name] 7/6/2022andnbsp8:00 [place] Record Number: [id number] Patient Account Number: [id number] Date of Birth/Gender: Treating  RN: 1950-12-02 (70  y.o. [name] Primary Care Physician: Hassell Done, [name] Other Clinician: Referring Physician: Treating Physician/Extender: Antonietta Breach, [name] Weeks in Treatment: 2 Education Assessment Education Provided To: Patient Education Topics Provided Wound/Skin Impairment: Methods: Explain/Verbal Responses: State content correctly Electronic Signature(s) Signed: 07/31/2020 7:11:13 PM By: Levan Hurst RN, BSN Entered By: Levan Hurst on 07/31/2020 09:03:47 -------------------------------------------------------------------------------- Wound Assessment Details Patient Name: Date of Service: [name], [name] 07/31/2020 8:00 A M Medical Record Number: [id number] Patient Account Number: [id number] Date of Birth/Sex: Treating RN: [birth year] (70 y.o. [name] Primary Care Makyia Erxleben: Hassell Done, [name] Other Clinician: Referring Holliday Sheaffer: Treating Kavir Savoca/Extender: Antonietta Breach, [name] Weeks in Treatment: 2 Wound Status Wound Number: 3 Primary Infection - not elsewhere classified Etiology: Wound Location: Left, Medial Malleolus Wound Open Wounding Event: Gradually Appeared Status: Date Acquired: 05/27/2020 Comorbid Arrhythmia, Deep Vein Thrombosis, Hypertension, Received Weeks Of Treatment: 2 History: Chemotherapy, Confinement Anxiety Clustered Wound: No Photos Wound Measurements Length: (cm) 0.6 Width: (cm) 0.5 Depth: (cm) 0.1 Area: (cm) 0.236 Volume: (cm) 0.024 % Reduction in Area: 76% % Reduction in Volume: 75.5% Epithelialization: Medium (34-66%) Tunneling: No Undermining: No Wound Description Classification: Full Thickness Without Exposed Support Structures Wound Margin: Flat and Intact Exudate Amount: Medium Exudate Type: Serosanguineous Exudate Color: red, brown Foul Odor After Cleansing: No Slough/Fibrino No Wound Bed Granulation Amount: Large (67-100%) Exposed  Structure Granulation Quality: Red, Pink Fascia Exposed: No Necrotic Amount: None Present (0%) Fat Layer (Subcutaneous Tissue) Exposed: Yes Tendon Exposed: No Muscle Exposed: No Joint Exposed: No Bone Exposed: No Treatment Notes Wound #3 (Malleolus) Wound Laterality: Left, Medial Cleanser Soap and Water Discharge Instruction: May shower and wash wound with dial antibacterial soap and water prior to dressing change. Wound Cleanser Discharge Instruction: Cleanse the wound with wound cleanser prior to applying a clean dressing using gauze sponges, not tissue or cotton balls. Peri-Wound Care Triamcinolone 15 (g) Discharge Instruction: T Wound and Periwound o Sween Lotion (Moisturizing lotion) Discharge Instruction: Apply moisturizing lotion as directed Topical Primary Dressing Hydrofera Blue Ready Foam, 2.5 x2.5 in Discharge Instruction: Apply to wound bed as instructed Secondary Dressing Woven Gauze Sponge, Non-Sterile 4x4 in Discharge Instruction: Apply over primary dressing as directed. Secured With Compression Wrap ThreePress (3 layer compression wrap) Discharge Instruction: Apply three layer compression as directed. Compression Stockings Add-Ons Electronic Signature(s) Signed: 07/31/2020 5:07:06 PM By: Leane Call Signed: 07/31/2020 5:22:20 PM By: Sandre Kitty Entered By: Sandre Kitty on 07/31/2020 16:56:04 -------------------------------------------------------------------------------- [place] Details Patient Name: Date of Service: [name], [name] 07/31/2020 8:00 A M Medical Record Number: [id number] Patient Account Number: [id number] Date of Birth/Sex: Treating RN: October 26, 1950 (70 y.o. [name] Primary Care Yaniyah Koors: Hassell Done, [name] Other Clinician: Referring Makailee Nudelman: Treating Kenzley Ke/Extender: Antonietta Breach, [name] Weeks in Treatment: 2 Vital Signs Time Taken: 07:56 Temperature (F): 98.1 Height (in): 67 Pulse  (bpm): 66 Weight (lbs): 244 Respiratory Rate (breaths/min): 16 Body Mass Index (BMI): 38.2 Blood Pressure (mmHg): 145/78 Reference Range: 80 - 120 mg / dl Electronic Signature(s) Signed: 07/31/2020 5:07:06 PM By: Leane Call Entered By: Leane Call on 07/31/2020 07:57:16

## 2020-08-07 ENCOUNTER — Other Ambulatory Visit: Payer: Self-pay

## 2020-08-07 ENCOUNTER — Encounter (HOSPITAL_BASED_OUTPATIENT_CLINIC_OR_DEPARTMENT_OTHER): Payer: PPO | Admitting: Internal Medicine

## 2020-08-07 DIAGNOSIS — I87332 Chronic venous hypertension (idiopathic) with ulcer and inflammation of left lower extremity: Secondary | ICD-10-CM | POA: Diagnosis not present

## 2020-08-07 DIAGNOSIS — L97529 Non-pressure chronic ulcer of other part of left foot with unspecified severity: Secondary | ICD-10-CM | POA: Diagnosis not present

## 2020-08-08 NOTE — Progress Notes (Signed)
[name], [name] ([id number]) Visit Report for 08/07/2020 HPI Details Patient Name: Date of Service: [name], [name]. 08/07/2020 8:00 A M Medical Record Number: [id number] Patient Account Number: [id number] Date of Birth/Sex: Treating RN: [birth year] (70 y.o. [name] Primary Care Provider: Hassell Done, Denise Other Clinician: Referring Provider: Treating Provider/Extender: [name] in Treatment: 3 History of Present Illness HPI Description: ADMISSION 07/12/17 This is a 70 year old woman who lives in [place]. She is a nondiabetic nonsmoker but she has factor V Leiden deficiency and a history of DVT in the left leg. She is on chronic anticoagulation with Coumadin more recently switched to Xarelto. 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 she had a wound in 2014 in the same area and was seen in the wound care clinic I think in [place] and the wound closed. Patient is had a history of a wound on the left medial ankle since October 2018. She was seen by her primary physician in January 2009 with the same venous ulcer. Was given [facility]. She was not followed up until May of this year at an urgent care at [facility]. She was felt to have cellulitis and given clindamycin. She was seen in the same urgent care on 07/06/17 also felt to have refractory cellulitis and the clindamycin was extended although the 2 days later she developed abdominal pain without diarrhea she stop the clindamycin and the pain resolved. She is simply washing this off with soap and water. She has not had venous reflux studies however she has had several ultrasounds of the left leg most recently in July 2016. This was done due to chronic left lower extremity pain and swelling. The result was that the left femoral vein remains atretic with sequela of prior DVT She has stable chronic deep  venous . thrombosis in the left popliteal vein. No new thrombosis is noted. Superficial varicose varicosities noted in the left ankle. Beside the factor V Leiden the patient has a history of a choose one tumor as noted. Hypertension ventricular tachycardia B12 deficiency and anxiety. She has a history of allergy to nylon which she states is precluded wearing compression stockings although she has worn TED hose that have not cause any skin breakdown. She is not currently wearing these. ABI in our clinic was 0.91 on the left 07/16/17; 2 wounds on theleft medial calf/ankle. These are reminiscence once larger wound. The dimensions are smaller. Surface of the wound looks healthy. The patient has no specific complaints of pain or drainage. She is allergic to nylon but handled R3 layer compression with a Kerlix contact layer well. No burning no itching no skin irritation 07/23/17; 2 wounds on the left medial calf/ankle. Patient has no specific complaints to the 3 layer with Kerlix surface that we are using to avoid her nylon allergy. She has no complaints. Wounds are smaller 07/30/17; 2 wounds on the left medial calf/ankle. Larger one is superior. We have been using 3 layer compression with Kerlix as the patient has a nylon allergy. Her wounds are smaller. She is researching stockings without nylon online which will have to be 20-30 mm compression 08/06/17; the inferior wound is fully epithelialized. Superior wound has a small satellite lesion just superior to it. We've been using silver collagen. We are providing the 3 layer compression. The patient tells me she has compression stockings she is ordered off [place] 08/13/17; both wounds  are fully epithelialized. Nice result. The patient has her own compression stockings that are new. She will need to lubricate her skin. READMISSION 07/12/2020 [name] is a 70 year old woman now who has chronic venous disease secondary to factor V Leiden deficiency and a  history of DVT She is on chronic s. anticoagulation and although my previous notes suggest Eliquis she says she is actually on Xarelto. She is not a diabetic. She tells [place] that in early May she took a trip to [place] with family. Almost as soon as she got out of the car she had inflammation and erythema in the area this did not get any better. She saw her primary doctor and was given ciprofloxacin although she developed some form of reaction to this involving her mouth and lips. She was switched to doxycycline which she is just finishing. She developed an area of breakdown on the left medial lower leg and ankle area. Most of this is already healed. She chronically wears 20/30 below-knee compression stockings although they are old but she did not wear them while she had the infection. 6/24; patient arrives 1 week follow-up. We are using silver alginate under compression wound looks a lot better. 6/29; 1 week follow-up. Not much change although the wounds surfaces still look as though they are smaller. Surrounding slough and eschar. Edema control is good 7/6; 1 week follow-up. Only a very small open area remains here. She has dry flaking skin and eschar over the entire surface of this area which she says she has been dealing with for some time. I am not totally sure of the issue here. After removal of this there is only small areas remaining. She has compression stockings which she wears every now and then. 7/13; 1 week follow-up. The patient has close the area on the left medial lower leg. She has severe skin damage in this area with very tightly fibrosed skin hemosiderin deposition. In the upper 50% of her lower leg there is dilated veins edema. The last time she had a wound in this area was however 3 years ago according to the patient. She has 20/30 below-knee stockings from elastic therapy and I talked to her at some length about whether she is prepared to go ahead and use these  now. Last week we looked at an area on her left lateral second toe. She says she got this when she was using a lawn more and wonder whether she was bit by something. Very odd circular area with what looks to be raised epithelium but open areas in between. This does not look like tinea pedis. She does not think she has had a skin lesion in this area that she did not recognize [i.e. plantar wart]. It does not look to be infected. Her primary doctor gave her steroid cream that did not help. She has been using Neosporin this looks about the same as last week but truthfully I am not sure what this is Electronic Signature(s) Signed: 08/07/2020 5:03:28 PM By: Linton Ham MD Entered By: Linton Ham on 08/07/2020 08:13:16 -------------------------------------------------------------------------------- Physical Exam Details Patient Name: Date of Service: [name], [name] 08/07/2020 8:00 A M Medical Record Number: [id number] Patient Account Number: [id number] Date of Birth/Sex: Treating RN: 14-Feb-1950 (70 y.o. [name] Primary Care Provider: Hassell Done, Denise Other Clinician: Referring Provider: Treating Provider/Extender: [name] in Treatment: 3 Constitutional Sitting or standing Blood Pressure is within target range for patient.. Pulse regular and within target range  for patient.Marland Kitchen Respirations regular, non-labored and within target range.. Temperature is normal and within the target range for the patient.Marland Kitchen Appears in no distress. Notes Wound exam; medial part of the left lower leg I think this is essentially closed she still has dry skin and tightly fibrosed skin in this area [lipodermatosclerosis]. From about the midpoint of the tibia to her knee is swollen, dilated varicose veins.. The area on her lateral second toe just proximal to the PIP. This is a round circular raised area. It has openings in the middle that have exposed  subcutaneous tissue. I am not sure what this represents Electronic Signature(s) Signed: 08/07/2020 5:03:28 PM By: Linton Ham MD Entered By: Linton Ham on 08/07/2020 18:56:31 -------------------------------------------------------------------------------- Physician Orders Details Patient Name: Date of Service: [name], [name] 08/07/2020 8:00 A M Medical Record Number: [id number] Patient Account Number: [id number] Date of Birth/Sex: Treating RN: [birth year] (70 y.o. [name] Primary Care Provider: Hassell Done, Denise Other Clinician: Referring Provider: Treating Provider/Extender: [name] in Treatment: 3 Verbal / Phone Orders: No Diagnosis Coding ICD-10 Coding Code Description [phone] Chronic venous hypertension (idiopathic) with ulcer and inflammation of left lower extremity L97.821 Non-pressure chronic ulcer of other part of left lower leg limited to breakdown of skin D68.8 Other specified coagulation defects Discharge From [facility] Discharge from [place] [facility] healed!! Follow up with Primary MD or Podiatry for left second toe. Bathing/ Shower/ Hygiene May shower and wash wound with soap and water. Edema Control - Lymphedema / SCD / Other Elevate legs to the level of the heart or above for 30 minutes daily and/or when sitting, a frequency of: - throughout the day Avoid standing for long periods of time. Moisturize legs daily. Compression stocking or Garment 20-30 mm/Hg pressure to: - both legs daily Additional Orders / Instructions Follow Nutritious Diet Other: - Apply silver alginate to left second toe daily, keep toes separated, and follow up with primary doctor. Electronic Signature(s) Signed: 08/07/2020 5:03:28 PM By: Linton Ham MD Signed: 08/08/2020 6:01:10 PM By: Levan Hurst RN, BSN Entered By: Levan Hurst on 08/07/2020  08:08:18 -------------------------------------------------------------------------------- Problem List Details Patient Name: Date of Service: [name], [name] 08/07/2020 8:00 A M Medical Record Number: [id number] Patient Account Number: [id number] Date of Birth/Sex: Treating RN: 12/06/1950 (70 y.o. [name] Primary Care Provider: Hassell Done, Denise Other Clinician: Referring Provider: Treating Provider/Extender: [name] in Treatment: 3 Active Problems ICD-10 Encounter Code Description Active Date MDM Diagnosis I87.332 Chronic venous hypertension (idiopathic) with ulcer and inflammation of left 07/12/2020 No Yes lower extremity L97.821 Non-pressure chronic ulcer of other part of left lower leg limited to breakdown 07/12/2020 No Yes of skin D68.8 Other specified coagulation defects 07/12/2020 No Yes Inactive Problems Resolved Problems Electronic Signature(s) Signed: 08/07/2020 5:03:28 PM By: Linton Ham MD Entered By: Linton Ham on 08/07/2020 08:10:18 -------------------------------------------------------------------------------- Progress Note Details Patient Name: Date of Service: [name], [name] 08/07/2020 8:00 A M Medical Record Number: [id number] Patient Account Number: [id number] Date of Birth/Sex: Treating RN: [birth year] (70 y.o. [name] Primary Care Provider: Hassell Done, Denise Other Clinician: Referring Provider: Treating Provider/Extender: [name] in Treatment: 3 Subjective History of Present Illness (HPI) ADMISSION 07/12/17 This is a 70 year old woman who lives in [place]. She is a nondiabetic nonsmoker but she has factor V Leiden deficiency and a history of DVT in the left leg. She is on chronic anticoagulation with Coumadin more recently switched to  Xarelto. 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 she had a wound in 2014 in the same area and was seen in the wound care clinic I think in [place] and the wound closed. Patient is had a history of a wound on the left medial ankle since October 2018. She was seen by her primary physician in January 2009 with the same venous ulcer. Was given [facility]. She was not followed up until May of this year at an urgent care at [facility]. She was felt to have cellulitis and given clindamycin. She was seen in the same urgent care on 07/06/17 also felt to have refractory cellulitis and the clindamycin was extended although the 2 days later she developed abdominal pain without diarrhea she stop the clindamycin and the pain resolved. She is simply washing this off with soap and water. She has not had venous reflux studies however she has had several ultrasounds of the left leg most recently in July 2016. This was done due to chronic left lower extremity pain and swelling. The result was that the left femoral vein remains atretic with sequela of prior DVT She has stable chronic deep venous . thrombosis in the left popliteal vein. No new thrombosis is noted. Superficial varicose varicosities noted in the left ankle. Beside the factor V Leiden the patient has a history of a choose one tumor as noted. Hypertension ventricular tachycardia B12 deficiency and anxiety. She has a history of allergy to nylon which she states is precluded wearing compression stockings although she has worn TED hose that have not cause any skin breakdown. She is not currently wearing these. ABI in our clinic was 0.91 on the left 07/16/17; 2 wounds on theleft medial calf/ankle. These are reminiscence once larger wound. The dimensions are smaller. Surface of the wound looks healthy. The patient has no specific complaints of pain or drainage. She is allergic to nylon but handled R3 layer compression with a Kerlix contact layer well. No burning no itching no skin  irritation 07/23/17; 2 wounds on the left medial calf/ankle. Patient has no specific complaints to the 3 layer with Kerlix surface that we are using to avoid her nylon allergy. She has no complaints. Wounds are smaller 07/30/17; 2 wounds on the left medial calf/ankle. Larger one is superior. We have been using 3 layer compression with Kerlix as the patient has a nylon allergy. Her wounds are smaller. She is researching stockings without nylon online which will have to be 20-30 mm compression 08/06/17; the inferior wound is fully epithelialized. Superior wound has a small satellite lesion just superior to it. We've been using silver collagen. We are providing the 3 layer compression. The patient tells me she has compression stockings she is ordered off [place] 08/13/17; both wounds are fully epithelialized. Nice result. The patient has her own compression stockings that are new. She will need to lubricate her skin. READMISSION 07/12/2020 [name] is a 70 year old woman now who has chronic venous disease secondary to factor V Leiden deficiency and a history of DVT She is on chronic s. anticoagulation and although my previous notes suggest Eliquis she says she is actually on Xarelto. She is not a diabetic. She tells [place] that in early May she took a trip to [place] with family. Almost as soon as she got out of the car she had inflammation and erythema in the area this did not get  any better. She saw her primary doctor and was given ciprofloxacin although she developed some form of reaction to this involving her mouth and lips. She was switched to doxycycline which she is just finishing. She developed an area of breakdown on the left medial lower leg and ankle area. Most of this is already healed. She chronically wears 20/30 below-knee compression stockings although they are old but she did not wear them while she had the infection. 6/24; patient arrives 1 week follow-up. We are using silver  alginate under compression wound looks a lot better. 6/29; 1 week follow-up. Not much change although the wounds surfaces still look as though they are smaller. Surrounding slough and eschar. Edema control is good 7/6; 1 week follow-up. Only a very small open area remains here. She has dry flaking skin and eschar over the entire surface of this area which she says she has been dealing with for some time. I am not totally sure of the issue here. After removal of this there is only small areas remaining. She has compression stockings which she wears every now and then. 7/13; 1 week follow-up. The patient has close the area on the left medial lower leg. She has severe skin damage in this area with very tightly fibrosed skin hemosiderin deposition. In the upper 50% of her lower leg there is dilated veins edema. The last time she had a wound in this area was however 3 years ago according to the patient. She has 20/30 below-knee stockings from elastic therapy and I talked to her at some length about whether she is prepared to go ahead and use these now. Last week we looked at an area on her left lateral second toe. She says she got this when she was using a lawn more and wonder whether she was bit by something. Very odd circular area with what looks to be raised epithelium but open areas in between. This does not look like tinea pedis. She does not think she has had a skin lesion in this area that she did not recognize [i.e. plantar wart]. It does not look to be infected. Her primary doctor gave her steroid cream that did not help. She has been using Neosporin this looks about the same as last week but truthfully I am not sure what this is Objective Constitutional Sitting or standing Blood Pressure is within target range for patient.. Pulse regular and within target range for patient.Marland Kitchen Respirations regular, non-labored and within target range.. Temperature is normal and within the target range for the  patient.Marland Kitchen Appears in no distress. Vitals Time Taken: 7:49 AM, Height: 67 in, Weight: 244 lbs, BMI: 38.2, Temperature: 97.8 F, Pulse: 56 bpm, Respiratory Rate: 17 breaths/min, Blood Pressure: 124/89 mmHg. General Notes: Wound exam; medial part of the left lower leg I think this is essentially closed she still has dry skin and tightly fibrosed skin in this area [lipodermatosclerosis]. From about the midpoint of the tibia to her knee is swollen, dilated varicose veins.. The area on her lateral second toe just proximal to the PIP. This is a round circular raised area. It has openings in the middle that have exposed subcutaneous tissue. I am not sure what this represents Integumentary (Hair, Skin) Wound #3 status is Healed - Epithelialized. Original cause of wound was Gradually Appeared. The date acquired was: 05/27/2020. The wound has been in treatment 3 Macdonald. The wound is located on the Left,Medial Malleolus. The wound measures 0cm length x 0cm width x 0cm depth;  0cm^2 area and 0cm^3 volume. There is no tunneling or undermining noted. There is a none present amount of drainage noted. The wound margin is flat and intact. There is no granulation within the wound bed. There is no necrotic tissue within the wound bed. Wound #4 status is Open. Original cause of wound was Gradually Appeared. The date acquired was: 08/07/2020. The wound is located on the Left T Second. oe The wound measures 1cm length x 0.8cm width x 0.1cm depth; 0.628cm^2 area and 0.063cm^3 volume. There is no tunneling or undermining noted. There is a medium amount of serosanguineous drainage noted. The wound margin is distinct with the outline attached to the wound base. There is large (67-100%) red, pink granulation within the wound bed. There is a small (1-33%) amount of necrotic tissue within the wound bed including Adherent Slough. Assessment Active Problems ICD-10 Chronic venous hypertension (idiopathic) with ulcer and inflammation  of left lower extremity Non-pressure chronic ulcer of other part of left lower leg limited to breakdown of skin Other specified coagulation defects Plan Discharge From [facility] Services: Discharge from [place] healed!! Follow up with Primary MD or Podiatry for left second toe. Bathing/ Shower/ Hygiene: May shower and wash wound with soap and water. Edema Control - Lymphedema / SCD / Other: Elevate legs to the level of the heart or above for 30 minutes daily and/or when sitting, a frequency of: - throughout the day Avoid standing for long periods of time. Moisturize legs daily. Compression stocking or Garment 20-30 mm/Hg pressure to: - both legs daily Additional Orders / Instructions: Follow Nutritious Diet Other: - Apply silver alginate to left second toe daily, keep toes separated, and follow up with primary doctor. 1. The patient has severe chronic venous insufficiency in the medial part of the distal 50% of her lower leg. The skin here is badly damaged and we talked about this. I think she is ultimately going to require compression stockings. I am not sure if she is ready to wear these now. 2. I do not think there is any need to further investigate her legs. She does not have edema in her upper leg and she has not been wearing stockings. At some point she may need a venous duplex but I do not think that time is now. She of course has a history of factor V Leiden and recurrent DVT s 3. The area on her left second toe is of unclear etiology to me. I think the best thing to do is keep her toes separated and use silver alginate to see if this will epithelialize over. I wonder if this might represent a plantar wart and whether she might need to see podiatry if this worsens 4. The patient can be discharged from our clinic. Electronic Signature(s) Signed: 08/07/2020 5:03:28 PM By: Linton Ham MD Entered By: Linton Ham on 08/07/2020  08:18:56 -------------------------------------------------------------------------------- SuperBill Details Patient Name: Date of Service: [name], [name] 08/07/2020 Medical Record Number: [id number] Patient Account Number: [id number] Date of Birth/Sex: Treating RN: 04-28-1950 (70 y.o. [name] Primary Care Provider: Hassell Done, Denise Other Clinician: Referring Provider: Treating Provider/Extender: [name] in Treatment: 3 Diagnosis Coding ICD-10 Codes Code Description [phone] Chronic venous hypertension (idiopathic) with ulcer and inflammation of left lower extremity L97.821 Non-pressure chronic ulcer of other part of left lower leg limited to breakdown of skin D68.8 Other specified coagulation defects Facility Procedures Physician Procedures : CPT4 Code Description Modifier [id number] - WC PHYS  LEVEL 3 - EST PT ICD-10 Diagnosis Description I87.332 Chronic venous hypertension (idiopathic) with ulcer and inflammation of left lower extremity L97.821 Non-pressure chronic ulcer of other part  of left lower leg limited to breakdown of skin D68.8 Other specified coagulation defects Quantity: 1 Electronic Signature(s) Signed: 08/07/2020 5:03:28 PM By: Linton Ham MD Signed: 08/08/2020 6:01:10 PM By: Levan Hurst RN, BSN Entered By: Levan Hurst on 08/07/2020 09:35:03

## 2020-08-12 NOTE — Progress Notes (Signed)
[name], [name] ([id number]) Visit Report for 08/07/2020 Arrival Information Details Patient Name: Date of Service: NA [name], Denise WISENER. 08/07/2020 8:00 A M Medical Record Number: [id number] Patient Account Number: [id number] Date of Birth/Sex: Treating RN: [birth year] (70 y.o. Tonita Phoenix, Lauren Primary Care Justen Fonda: Hassell Done, [name] Other Clinician: Referring Drury Ardizzone: Treating Annora Guderian/Extender: Antonietta Breach, [name] Weeks in Treatment: 3 Visit Information History Since Last Visit Added or deleted any medications: No Patient Arrived: Ambulatory Any new allergies or adverse reactions: No Arrival Time: 07:48 Had a fall or experienced change in No Accompanied By: self activities of daily living that may affect Transfer Assistance: None risk of falls: Patient Identification Verified: Yes Signs or symptoms of abuse/neglect since last visito No Secondary Verification Process Completed: Yes Hospitalized since last visit: No Patient Requires Transmission-Based Precautions: No Implantable device outside of the clinic excluding No Patient Has Alerts: Yes cellular tissue based products placed in the center Patient Alerts: Patient on Blood Thinner since last visit: Has Dressing in Place as Prescribed: Yes Pain Present Now: No Electronic Signature(s) Signed: 08/07/2020 6:12:45 PM By: Rhae Hammock RN Entered By: Rhae Hammock on 08/07/2020 07:48:59 -------------------------------------------------------------------------------- Clinic Level of Care Assessment Details Patient Name: Date of Service: NA [name], [name] 08/07/2020 8:00 A M Medical Record Number: [id number] Patient Account Number: [id number] Date of Birth/Sex: Treating RN: [birth year] (70 y.o. Nancy Fetter Primary Care Ketara Cavness: Hassell Done, [name] Other Clinician: Referring Kyrianna Barletta: Treating Teagan Ozawa/Extender: Antonietta Breach, [name] Weeks in Treatment: 3 Clinic Level of  Care Assessment Items TOOL 4 Quantity Score X- 1 0 Use when only an EandM is performed on FOLLOW-UP visit ASSESSMENTS - Nursing Assessment / Reassessment X- 1 10 Reassessment of Co-morbidities (includes updates in patient status) X- 1 5 Reassessment of Adherence to Treatment Plan ASSESSMENTS - Wound and Skin A ssessment / Reassessment X - Simple Wound Assessment / Reassessment - one wound 1 5 []  - 0 Complex Wound Assessment / Reassessment - multiple wounds []  - 0 Dermatologic / Skin Assessment (not related to wound area) ASSESSMENTS - Focused Assessment []  - 0 Circumferential Edema Measurements - multi extremities []  - 0 Nutritional Assessment / Counseling / Intervention X- 1 5 Lower Extremity Assessment (monofilament, tuning fork, pulses) []  - 0 Peripheral Arterial Disease Assessment (using hand held doppler) ASSESSMENTS - Ostomy and/or Continence Assessment and Care []  - 0 Incontinence Assessment and Management []  - 0 Ostomy Care Assessment and Management (repouching, etc.) PROCESS - Coordination of Care X - Simple Patient / Family Education for ongoing care 1 15 []  - 0 Complex (extensive) Patient / Family Education for ongoing care X- 1 10 Staff obtains Programmer, systems, Records, T Results / Process Orders est []  - 0 Staff telephones HHA, Nursing Homes / Clarify orders / etc []  - 0 Routine Transfer to another Facility (non-emergent condition) []  - 0 Routine Hospital Admission (non-emergent condition) []  - 0 New Admissions / Biomedical engineer / Ordering NPWT Apligraf, etc. , []  - 0 Emergency Hospital Admission (emergent condition) X- 1 10 Simple Discharge Coordination []  - 0 Complex (extensive) Discharge Coordination PROCESS - Special Needs []  - 0 Pediatric / Minor Patient Management []  - 0 Isolation Patient Management []  - 0 Hearing / Language / Visual special needs []  - 0 Assessment of Community assistance (transportation, D/C planning, etc.) []  -  0 Additional assistance / Altered mentation []  - 0 Support Surface(s) Assessment (bed, cushion, seat, etc.) INTERVENTIONS - Wound Cleansing / Measurement X - Simple Wound Cleansing - one wound 1 5 []  -  0 Complex Wound Cleansing - multiple wounds X- 1 5 Wound Imaging (photographs - any number of wounds) []  - 0 Wound Tracing (instead of photographs) X- 1 5 Simple Wound Measurement - one wound []  - 0 Complex Wound Measurement - multiple wounds INTERVENTIONS - Wound Dressings []  - 0 Small Wound Dressing one or multiple wounds []  - 0 Medium Wound Dressing one or multiple wounds []  - 0 Large Wound Dressing one or multiple wounds []  - 0 Application of Medications - topical []  - 0 Application of Medications - injection INTERVENTIONS - Miscellaneous []  - 0 External ear exam []  - 0 Specimen Collection (cultures, biopsies, blood, body fluids, etc.) []  - 0 Specimen(s) / Culture(s) sent or taken to Lab for analysis []  - 0 Patient Transfer (multiple staff / Civil Service fast streamer / Similar devices) []  - 0 Simple Staple / Suture removal (25 or less) []  - 0 Complex Staple / Suture removal (26 or more) []  - 0 Hypo / Hyperglycemic Management (close monitor of Blood Glucose) []  - 0 Ankle / Brachial Index (ABI) - do not check if billed separately X- 1 5 Vital Signs Has the patient been seen at the hospital within the last three years: Yes Total Score: 80 Level Of Care: New/Established - Level 3 Electronic Signature(s) Signed: 08/08/2020 6:01:10 PM By: Levan Hurst RN, BSN Entered By: Levan Hurst on 08/07/2020 09:34:53 -------------------------------------------------------------------------------- Lower Extremity Assessment Details Patient Name: Date of Service: NA [name], [name] 08/07/2020 8:00 A M Medical Record Number: [id number] Patient Account Number: [id number] Date of Birth/Sex: Treating RN: [birth year] (70 y.o. Tonita Phoenix, Lauren Primary Care Hanne Kegg: Hassell Done, [name]  Other Clinician: Referring Morrell Fluke: Treating Hartwell Vandiver/Extender: Antonietta Breach, [name] Weeks in Treatment: 3 Edema Assessment Assessed: [Left: Yes] [Right: No] Edema: [Left: Ye] [Right: s] Calf Left: Right: Point of Measurement: 27 cm From Medial Instep 34.5 cm Ankle Left: Right: Point of Measurement: 9 cm From Medial Instep 20.5 cm Vascular Assessment Pulses: Dorsalis Pedis Palpable: [Left:Yes] Posterior Tibial Palpable: [Left:Yes] Electronic Signature(s) Signed: 08/07/2020 6:12:45 PM By: Rhae Hammock RN Entered By: Rhae Hammock on 08/07/2020 07:51:59 -------------------------------------------------------------------------------- Multi Wound Chart Details Patient Name: Date of Service: NA [name], [name] 08/07/2020 8:00 A M Medical Record Number: [id number] Patient Account Number: [id number] Date of Birth/Sex: Treating RN: 1950/09/26 (70 y.o. Nancy Fetter Primary Care Teliah Buffalo: Hassell Done, [name] Other Clinician: Referring Robby Pirani: Treating Elizabeth Haff/Extender: Antonietta Breach, [name] Weeks in Treatment: 3 Vital Signs Height(in): 67 Pulse(bpm): 49 Weight(lbs): 244 Blood Pressure(mmHg): 124/89 Body Mass Index(BMI): 38 Temperature(F): 97.8 Respiratory Rate(breaths/min): 17 Photos: [3:No Photos Left, Medial Malleolus] [4:No 11 Left T Second oe] [N/A:N/A N/A] Wound Location: [3:Gradually Appeared] [4:Gradually Appeared] [N/A:N/A] Wounding Event: [3:Infection - not elsewhere classified] [4:Fungal] [N/A:N/A] Primary Etiology: [3:Arrhythmia, Deep Vein Thrombosis,] [4:Arrhythmia, Deep Vein Thrombosis,] [N/A:N/A] Comorbid History: [3:Hypertension, Received Chemotherapy, Confinement Anxiety 05/27/2020] [4:Hypertension, Received Chemotherapy, Confinement Anxiety 08/07/2020] [N/A:N/A] Date Acquired: [3:3] [4:0] [N/A:N/A] Weeks of Treatment: [3:Healed - Epithelialized] [4:Open] [N/A:N/A] Wound Status: [3:0x0x0] [4:1x0.8x0.1]  [N/A:N/A] Measurements L x W x D (cm) [3:0] [4:0.628] [N/A:N/A] A (cm) : rea [3:0] [4:0.063] [N/A:N/A] Volume (cm) : [3:100.00%] [4:N/A] [N/A:N/A] % Reduction in Area: [3:100.00%] [4:N/A] [N/A:N/A] % Reduction in Volume: [3:Full Thickness Without Exposed] [4:Full Thickness Without Exposed] [N/A:N/A] Classification: [3:Support Structures None Present] [4:Support Structures Medium] [N/A:N/A] Exudate Amount: [3:N/A] [4:Serosanguineous] [N/A:N/A] Exudate Type: [3:N/A] [4:red, brown] [N/A:N/A] Exudate Color: [3:Flat and Intact] [4:Distinct, outline attached] [N/A:N/A] Wound Margin: [3:None Present (0%)] [4:Large (67-100%)] [N/A:N/A] Granulation Amount: [3:N/A] [4:Red, Pink] [N/A:N/A] Granulation Quality: [3:None Present (  0%)] [4:Small (1-33%)] [N/A:N/A] Necrotic Amount: [3:Fascia: No] [4:Fascia: No] [N/A:N/A] Exposed Structures: [3:Fat Layer (Subcutaneous Tissue): No Tendon: No Muscle: No Joint: No Bone: No Large (67-100%)] [4:Fat Layer (Subcutaneous Tissue): No Tendon: No Muscle: No Joint: No Bone: No None] [N/A:N/A] Treatment Notes Electronic Signature(s) Signed: 08/07/2020 5:03:28 PM By: Linton Ham MD Signed: 08/08/2020 6:01:10 PM By: Levan Hurst RN, BSN Entered By: Linton Ham on 08/07/2020 08:10:33 -------------------------------------------------------------------------------- Multi-Disciplinary Care Plan Details Patient Name: Date of Service: NA [name], [name] 08/07/2020 8:00 A M Medical Record Number: [id number] Patient Account Number: [id number] Date of Birth/Sex: Treating RN: Jul 11, 1950 (70 y.o. Nancy Fetter Primary Care Michelangelo Rindfleisch: Hassell Done, [name] Other Clinician: Referring Sherby Moncayo: Treating Kimble Hitchens/Extender: Antonietta Breach, [name] Weeks in Treatment: 3 Active Inactive Electronic Signature(s) Signed: 08/08/2020 6:01:10 PM By: Levan Hurst RN, BSN Entered By: Levan Hurst on 08/07/2020  08:09:54 -------------------------------------------------------------------------------- Pain Assessment Details Patient Name: Date of Service: NA [name], [name] 08/07/2020 8:00 A M Medical Record Number: [id number] Patient Account Number: [id number] Date of Birth/Sex: Treating RN: [birth year] (70 y.o. Tonita Phoenix, Lauren Primary Care Hermenegildo Clausen: Hassell Done, [name] Other Clinician: Referring Reggie Bise: Treating Ambriel Gorelick/Extender: Antonietta Breach, [name] Weeks in Treatment: 3 Active Problems Location of Pain Severity and Description of Pain Patient Has Paino No Site Locations Pain Management and Medication Current Pain Management: Electronic Signature(s) Signed: 08/07/2020 6:12:45 PM By: Rhae Hammock RN Entered By: Rhae Hammock on 08/07/2020 07:50:00 -------------------------------------------------------------------------------- Patient/Caregiver Education Details Patient Name: Date of Service: NA [name], Rashada L. 7/13/2022andnbsp8:00 [place] Record Number: [id number] Patient Account Number: [id number] Date of Birth/Gender: Treating RN: [birth year] (70 y.o. Nancy Fetter Primary Care Physician: Hassell Done, [name] Other Clinician: Referring Physician: Treating Physician/Extender: Antonietta Breach, [name] Weeks in Treatment: 3 Education Assessment Education Provided To: Patient Education Topics Provided Wound/Skin Impairment: Methods: Explain/Verbal Responses: State content correctly Electronic Signature(s) Signed: 08/08/2020 6:01:10 PM By: Levan Hurst RN, BSN Entered By: Levan Hurst on 08/07/2020 08:00:25 -------------------------------------------------------------------------------- Wound Assessment Details Patient Name: Date of Service: NA [name], [name] 08/07/2020 8:00 A M Medical Record Number: [id number] Patient Account Number: [id number] Date of Birth/Sex: Treating RN: 01-06-1951 (70 y.o. Tonita Phoenix,  Lowry Primary Care Harleen Fineberg: Hassell Done, [name] Other Clinician: Referring Yossi Hinchman: Treating Che Rachal/Extender: Antonietta Breach, [name] Weeks in Treatment: 3 Wound Status Wound Number: 3 Primary Infection - not elsewhere classified Etiology: Wound Location: Left, Medial Malleolus Wound Healed - Epithelialized Wounding Event: Gradually Appeared Status: Date Acquired: 05/27/2020 Comorbid Arrhythmia, Deep Vein Thrombosis, Hypertension, Received Weeks Of Treatment: 3 History: Chemotherapy, Confinement Anxiety Clustered Wound: No Wound Measurements Length: (cm) Width: (cm) Depth: (cm) Area: (cm) Volume: (cm) 0 % Reduction in Area: 100% 0 % Reduction in Volume: 100% 0 Epithelialization: Large (67-100%) 0 Tunneling: No 0 Undermining: No Wound Description Classification: Full Thickness Without Exposed Support Structures Wound Margin: Flat and Intact Exudate Amount: None Present Foul Odor After Cleansing: No Slough/Fibrino No Wound Bed Granulation Amount: None Present (0%) Exposed Structure Necrotic Amount: None Present (0%) Fascia Exposed: No Fat Layer (Subcutaneous Tissue) Exposed: No Tendon Exposed: No Muscle Exposed: No Joint Exposed: No Bone Exposed: No Electronic Signature(s) Signed: 08/07/2020 6:12:45 PM By: Rhae Hammock RN Signed: 08/08/2020 6:01:10 PM By: Levan Hurst RN, BSN Entered By: Levan Hurst on 08/07/2020 07:59:55 -------------------------------------------------------------------------------- Wound Assessment Details Patient Name: Date of Service: NA [name], [name] 08/07/2020 8:00 A M Medical Record Number: [id number] Patient Account Number: [id number] Date of Birth/Sex: Treating RN: [birth year] (70 y.o. Tonita Phoenix, Lauren Primary Care Jaquisha Frech: Hassell Done, [name] Other Clinician: Referring Ryin Ambrosius:  Treating Imanol Bihl/Extender: Antonietta Breach, [name] Weeks in Treatment: 3 Wound Status Wound Number: 4  Primary Fungal Etiology: Wound Location: Left T Second oe Wound Converted Wounding Event: Gradually Appeared Status: Date Acquired: 08/07/2020 Comorbid Arrhythmia, Deep Vein Thrombosis, Hypertension, Received Weeks Of Treatment: 0 History: Chemotherapy, Confinement Anxiety Clustered Wound: No Photos Wound Measurements Length: (cm) 1 Width: (cm) 0.8 Depth: (cm) 0.1 Area: (cm) 0.628 Volume: (cm) 0.063 % Reduction in Area: 0% % Reduction in Volume: 0% Epithelialization: None Tunneling: No Undermining: No Wound Description Classification: Full Thickness Without Exposed Support Structures Wound Margin: Distinct, outline attached Exudate Amount: Medium Exudate Type: Serosanguineous Exudate Color: red, brown Foul Odor After Cleansing: No Slough/Fibrino Yes Wound Bed Granulation Amount: Large (67-100%) Exposed Structure Granulation Quality: Red, Pink Fascia Exposed: No Necrotic Amount: Small (1-33%) Fat Layer (Subcutaneous Tissue) Exposed: No Necrotic Quality: Adherent Slough Tendon Exposed: No Muscle Exposed: No Joint Exposed: No Bone Exposed: No Electronic Signature(s) Signed: 08/08/2020 6:01:49 PM By: Rhae Hammock RN Signed: 08/12/2020 3:49:49 PM By: Sandre Kitty Previous Signature: 08/07/2020 6:12:45 PM Version By: Rhae Hammock RN Entered By: Sandre Kitty on 08/08/2020 08:23:12 -------------------------------------------------------------------------------- Vitals Details Patient Name: Date of Service: NA [name], [name] 08/07/2020 8:00 A M Medical Record Number: [id number] Patient Account Number: [id number] Date of Birth/Sex: Treating RN: 1950-12-21 (70 y.o. Tonita Phoenix, Lauren Primary Care Jarae Nemmers: Hassell Done, [name] Other Clinician: Referring Senai Kingsley: Treating Chaslyn Eisen/Extender: Antonietta Breach, [name] Weeks in Treatment: 3 Vital Signs Time Taken: 07:49 Temperature (F): 97.8 Height (in): 67 Pulse (bpm): 56 Weight  (lbs): 244 Respiratory Rate (breaths/min): 17 Body Mass Index (BMI): 38.2 Blood Pressure (mmHg): 124/89 Reference Range: 80 - 120 mg / dl Electronic Signature(s) Signed: 08/07/2020 6:12:45 PM By: Rhae Hammock RN Entered By: Rhae Hammock on 08/07/2020 07:49:54

## 2020-08-15 NOTE — Progress Notes (Signed)
RASHAN, PATIENT ([id number]) Visit Report for 07/19/2020 Arrival Information Details Patient Name: Date of Service: NA [name] 07/19/2020 9:00 A M Medical Record Number: [id number] Patient Account Number: [id number] Date of Birth/Sex: Treating RN: [birth year] (70 y.o. [name] Primary Care Taryll Reichenberger: Hassell Done, [name] Other Clinician: Referring Mindy Gali: Treating Abdulai Blaylock/Extender: Antonietta Breach, [name] Weeks in Treatment: 1 Visit Information History Since Last Visit Added or deleted any medications: No Patient Arrived: Ambulatory Any new allergies or adverse reactions: No Arrival Time: 09:21 Had a fall or experienced change in No Accompanied By: husband activities of daily living that may affect Transfer Assistance: None risk of falls: Patient Identification Verified: Yes Signs or symptoms of abuse/neglect since last visito No Secondary Verification Process Completed: Yes Hospitalized since last visit: No Patient Requires Transmission-Based Precautions: No Implantable device outside of the clinic excluding No Patient Has Alerts: Yes cellular tissue based products placed in the center Patient Alerts: Patient on Blood Thinner since last visit: Has Dressing in Place as Prescribed: Yes Pain Present Now: No Electronic Signature(s) Signed: 07/19/2020 11:07:50 AM By: Sandre Kitty Entered By: Sandre Kitty on 07/19/2020 09:22:16 -------------------------------------------------------------------------------- Compression Therapy Details Patient Name: Date of Service: [name], [name] 07/19/2020 9:00 A M Medical Record Number: [id number] Patient Account Number: [id number] Date of Birth/Sex: Treating RN: 01-Oct-1950 (70 y.o. [name] Primary Care Seleena Reimers: Hassell Done, [name] Other Clinician: Referring Kendahl Bumgardner: Treating Geeta Dworkin/Extender: Antonietta Breach, [name] Weeks in Treatment: 1 Compression Therapy Performed  for Wound Assessment: Wound #3 Left,Medial Malleolus Performed By: Clinician Deon Pilling, RN Compression Type: Three Layer Post Procedure Diagnosis Same as Pre-procedure Electronic Signature(s) Signed: 07/19/2020 6:17:32 PM By: Baruch Gouty RN, BSN Entered By: Baruch Gouty on 07/19/2020 09:58:07 -------------------------------------------------------------------------------- Lower Extremity Assessment Details Patient Name: Date of Service: [name], [name] 07/19/2020 9:00 A M Medical Record Number: [id number] Patient Account Number: [id number] Date of Birth/Sex: Treating RN: 01/12/1951 (70 y.o. Debby Bud Primary Care Collier Bohnet: Hassell Done, [name] Other Clinician: Referring Orhan Mayorga: Treating Jahmya Onofrio/Extender: Antonietta Breach, [name] Weeks in Treatment: 1 Edema Assessment Assessed: [Left: No] [Right: No] Edema: [Left: N] [Right: o] Calf Left: Right: Point of Measurement: 27 cm From Medial Instep 33 cm Ankle Left: Right: Point of Measurement: 9 cm From Medial Instep 22.9 cm Vascular Assessment Pulses: Dorsalis Pedis Palpable: [Left:Yes] Electronic Signature(s) Signed: 08/14/2020 8:09:40 PM By: Deon Pilling Previous Signature: 08/12/2020 9:14:07 PM Version By: Deon Pilling Entered By: Deon Pilling on 08/14/2020 20:09:40 -------------------------------------------------------------------------------- Multi Wound Chart Details Patient Name: Date of Service: [name], [name] 07/19/2020 9:00 A M Medical Record Number: [id number] Patient Account Number: [id number] Date of Birth/Sex: Treating RN: Oct 15, 1950 (70 y.o. Martyn Malay, Linda Primary Care Chancy Claros: Hassell Done, [name] Other Clinician: Referring Camara Renstrom: Treating Lorelle Macaluso/Extender: Antonietta Breach, [name] Weeks in Treatment: 1 Vital Signs Height(in): 67 Pulse(bpm): 21 Weight(lbs): 244 Blood Pressure(mmHg): 147/84 Body Mass Index(BMI): 38 Temperature(F):  97.8 Respiratory Rate(breaths/min): 18 Photos: [3:No Photos Left, Medial Malleolus] [N/A:N/A N/A] Wound Location: [3:Gradually Appeared] [N/A:N/A] Wounding Event: [3:Infection - not elsewhere classified] [N/A:N/A] Primary Etiology: [3:Arrhythmia, Deep Vein Thrombosis,] [N/A:N/A] Comorbid History: [3:Hypertension, Received Chemotherapy, Confinement Anxiety 05/27/2020] [N/A:N/A] Date Acquired: [3:1] [N/A:N/A] Weeks of Treatment: [3:Open] [N/A:N/A] Wound Status: [3:2.4x0.4x0.1] [N/A:N/A] Measurements L x W x D (cm) [3:0.754] [N/A:N/A] A (cm) : rea [3:0.075] [N/A:N/A] Volume (cm) : [3:23.20%] [N/A:N/A] % Reduction in Area: [3:23.50%] [N/A:N/A] % Reduction in Volume: [3:Full Thickness Without Exposed] [N/A:N/A] Classification: [3:Support Structures Small] [N/A:N/A] Exudate A mount: [3:Serous] [N/A:N/A] Exudate Type: [3:amber] [N/A:N/A] Exudate Color: [3:Flat  and Intact] [N/A:N/A] Wound Margin: [3:Large (67-100%)] [N/A:N/A] Granulation A mount: [3:Pink] [N/A:N/A] Granulation Quality: [3:Small (1-33%)] [N/A:N/A] Necrotic A mount: [3:Fat Layer (Subcutaneous Tissue): Yes N/A] Exposed Structures: [3:Fascia: No Tendon: No Muscle: No Joint: No Bone: No Medium (34-66%)] [N/A:N/A] Epithelialization: [3:Debridement - Excisional] [N/A:N/A] Debridement: Pre-procedure Verification/Time Out 10:00 [N/A:N/A] Taken: [3:Other] [N/A:N/A] Pain Control: [3:Subcutaneous, Slough] [N/A:N/A] Tissue Debrided: [3:Skin/Subcutaneous Tissue] [N/A:N/A] Level: [3:0.96] [N/A:N/A] Debridement A (sq cm): [3:rea Curette] [N/A:N/A] Instrument: [3:Minimum] [N/A:N/A] Bleeding: [3:Pressure] [N/A:N/A] Hemostasis A chieved: [3:0] [N/A:N/A] Procedural Pain: [3:0] [N/A:N/A] Post Procedural Pain: [3:Procedure was tolerated well] [N/A:N/A] Debridement Treatment Response: [3:2.4x0.4x0.1] [N/A:N/A] Post Debridement Measurements L x W x D (cm) [3:0.075] [N/A:N/A] Post Debridement Volume: (cm) [3:Compression Therapy]  [N/A:N/A] Procedures Performed: [3:Debridement] Treatment Notes Electronic Signature(s) Signed: 07/19/2020 5:20:40 PM By: Linton Ham MD Signed: 07/19/2020 6:17:32 PM By: Baruch Gouty RN, BSN Entered By: Linton Ham on 07/19/2020 10:15:35 -------------------------------------------------------------------------------- Multi-Disciplinary Care Plan Details Patient Name: Date of Service: [name], Rylie L. 07/19/2020 9:00 A M Medical Record Number: [id number] Patient Account Number: [id number] Date of Birth/Sex: Treating RN: 10/20/1950 (70 y.o. [name] Primary Care Twania Bujak: Hassell Done, [name] Other Clinician: Referring Lilja Soland: Treating Miia Blanks/Extender: Antonietta Breach, [name] Weeks in Treatment: 1 Active Inactive Wound/Skin Impairment Nursing Diagnoses: Impaired tissue integrity Goals: Patient/caregiver will verbalize understanding of skin care regimen Date Initiated: 07/12/2020 Target Resolution Date: 08/09/2020 Goal Status: Active Ulcer/skin breakdown will have a volume reduction of 30% by week 4 Date Initiated: 07/12/2020 Target Resolution Date: 08/09/2020 Goal Status: Active Interventions: Assess patient/caregiver ability to obtain necessary supplies Assess patient/caregiver ability to perform ulcer/skin care regimen upon admission and as needed Assess ulceration(s) every visit Provide education on ulcer and skin care Treatment Activities: Skin care regimen initiated : 07/12/2020 Topical wound management initiated : 07/12/2020 Notes: Electronic Signature(s) Signed: 07/19/2020 6:17:32 PM By: Baruch Gouty RN, BSN Entered By: Baruch Gouty on 07/19/2020 09:20:05 -------------------------------------------------------------------------------- Pain Assessment Details Patient Name: Date of Service: [name], Zyanna L. 07/19/2020 9:00 A M Medical Record Number: [id number] Patient Account Number: [id number] Date of Birth/Sex: Treating  RN: February 18, 1950 (70 y.o. [name] Primary Care Janeese Mcgloin: Hassell Done, [name] Other Clinician: Referring Garnell Begeman: Treating Jordana Dugue/Extender: Antonietta Breach, [name] Weeks in Treatment: 1 Active Problems Location of Pain Severity and Description of Pain Patient Has Paino No Site Locations Pain Management and Medication Current Pain Management: Electronic Signature(s) Signed: 07/19/2020 11:07:50 AM By: Sandre Kitty Signed: 07/19/2020 6:17:32 PM By: Baruch Gouty RN, BSN Entered By: Sandre Kitty on 07/19/2020 09:22:41 -------------------------------------------------------------------------------- Patient/Caregiver Education Details Patient Name: Date of Service: [name], Tonisha L. 6/24/2022andnbsp9:00 [place] Record Number: [id number] Patient Account Number: [id number] Date of Birth/Gender: Treating RN: [birth year] (70 y.o. [name] Primary Care Physician: Hassell Done, [name] Other Clinician: Referring Physician: Treating Physician/Extender: Antonietta Breach, [name] Weeks in Treatment: 1 Education Assessment Education Provided To: Patient Education Topics Provided Venous: Methods: Explain/Verbal Responses: Reinforcements needed, State content correctly Wound/Skin Impairment: Methods: Explain/Verbal Responses: Reinforcements needed, State content correctly Electronic Signature(s) Signed: 07/19/2020 6:17:32 PM By: Baruch Gouty RN, BSN Entered By: Baruch Gouty on 07/19/2020 09:20:31 -------------------------------------------------------------------------------- Wound Assessment Details Patient Name: Date of Service: [name], Marria L. 07/19/2020 9:00 A M Medical Record Number: [id number] Patient Account Number: [id number] Date of Birth/Sex: Treating RN: 1950/08/23 (70 y.o. [name] Primary Care Skylah Delauter: Hassell Done, [name] Other Clinician: Referring Danyia Borunda: Treating Leatrice Parilla/Extender:  Antonietta Breach, [name] Weeks in Treatment: 1 Wound Status Wound Number: 3 Primary Infection - not elsewhere classified Etiology: Wound Location: Left,  Medial Malleolus Wound Open Wounding Event: Gradually Appeared Status: Date Acquired: 05/27/2020 Comorbid Arrhythmia, Deep Vein Thrombosis, Hypertension, Received Weeks Of Treatment: 1 History: Chemotherapy, Confinement Anxiety Clustered Wound: No Photos Wound Measurements Length: (cm) 2.4 Width: (cm) 0.4 Depth: (cm) 0.1 Area: (cm) 0.754 Volume: (cm) 0.075 % Reduction in Area: 23.2% % Reduction in Volume: 23.5% Epithelialization: Medium (34-66%) Wound Description Classification: Full Thickness Without Exposed Support Structures Wound Margin: Flat and Intact Exudate Amount: Small Exudate Type: Serous Exudate Color: amber Foul Odor After Cleansing: No Slough/Fibrino Yes Wound Bed Granulation Amount: Large (67-100%) Exposed Structure Granulation Quality: Pink Fascia Exposed: No Necrotic Amount: Small (1-33%) Fat Layer (Subcutaneous Tissue) Exposed: Yes Necrotic Quality: Adherent Slough Tendon Exposed: No Muscle Exposed: No Joint Exposed: No Bone Exposed: No Electronic Signature(s) Signed: 07/19/2020 5:07:58 PM By: Sandre Kitty Signed: 07/19/2020 6:17:32 PM By: Baruch Gouty RN, BSN Entered By: Sandre Kitty on 07/19/2020 16:59:48 -------------------------------------------------------------------------------- Vitals Details Patient Name: Date of Service: [name], Jamiria L. 07/19/2020 9:00 A M Medical Record Number: [id number] Patient Account Number: [id number] Date of Birth/Sex: Treating RN: 30-Jan-1950 (70 y.o. [name] Primary Care Blessed Cotham: Hassell Done, [name] Other Clinician: Referring Uma Jerde: Treating Lejuan Botto/Extender: Antonietta Breach, [name] Weeks in Treatment: 1 Vital Signs Time Taken: 09:22 Temperature (F): 97.8 Height (in): 67 Pulse (bpm):  56 Weight (lbs): 244 Respiratory Rate (breaths/min): 18 Body Mass Index (BMI): 38.2 Blood Pressure (mmHg): 147/84 Reference Range: 80 - 120 mg / dl Electronic Signature(s) Signed: 07/19/2020 11:07:50 AM By: Sandre Kitty Entered By: Sandre Kitty on 07/19/2020 09:22:36

## 2020-09-06 ENCOUNTER — Other Ambulatory Visit: Payer: Self-pay | Admitting: Nurse Practitioner

## 2020-09-06 DIAGNOSIS — I10 Essential (primary) hypertension: Secondary | ICD-10-CM

## 2020-09-20 DIAGNOSIS — H40033 Anatomical narrow angle, bilateral: Secondary | ICD-10-CM | POA: Diagnosis not present

## 2020-09-20 DIAGNOSIS — H04123 Dry eye syndrome of bilateral lacrimal glands: Secondary | ICD-10-CM | POA: Diagnosis not present

## 2020-09-28 DIAGNOSIS — T783XXA Angioneurotic edema, initial encounter: Secondary | ICD-10-CM | POA: Diagnosis not present

## 2020-10-07 ENCOUNTER — Ambulatory Visit (INDEPENDENT_AMBULATORY_CARE_PROVIDER_SITE_OTHER): Payer: PPO | Admitting: Nurse Practitioner

## 2020-10-07 ENCOUNTER — Other Ambulatory Visit: Payer: Self-pay

## 2020-10-07 ENCOUNTER — Encounter: Payer: Self-pay | Admitting: Nurse Practitioner

## 2020-10-07 VITALS — BP 152/80 | HR 54 | Temp 98.2°F | Resp 20 | Ht 66.0 in | Wt 242.0 lb

## 2020-10-07 DIAGNOSIS — I10 Essential (primary) hypertension: Secondary | ICD-10-CM

## 2020-10-07 MED ORDER — LOSARTAN POTASSIUM-HCTZ 100-25 MG PO TABS: 1.0000 | ORAL_TABLET | Freq: Every day | ORAL | 1 refills | Status: DC

## 2020-10-07 NOTE — Progress Notes (Signed)
Subjective:    Patient ID: [name], female    DOB: [birth year], 70 y.o.   MRN: [id number]   Chief Complaint: Discuss meds (Had allergic reaction to BP meds per Urgent Care/)   HPI Patient come sin today for hospital follow up. She went to urgent care on 09/28/20 with angioedema. She was told that she was having allergic reaction to lisinopril. He told her to stop the meds and follow up with PCP. Her blood pressure at home has been running [id number] systolic. She has had some headaches since stopping lisinopril. Denies blurred vision.  BP Readings from Last 3 Encounters:  06/25/20 (!) 146/76  06/12/20 (!) 177/81  12/26/19 (!) 149/61         Review of Systems  Constitutional:  Negative for diaphoresis.  Eyes:  Negative for pain.  Respiratory:  Negative for shortness of breath.   Cardiovascular:  Negative for chest pain, palpitations and leg swelling.  Gastrointestinal:  Negative for abdominal pain.  Endocrine: Negative for polydipsia.  Skin:  Negative for rash.  Neurological:  Negative for dizziness, weakness and headaches.  Hematological:  Does not bruise/bleed easily.  All other systems reviewed and are negative.     Objective:   Physical Exam Vitals and nursing note reviewed.  Constitutional:      General: She is not in acute distress.    Appearance: Normal appearance. She is well-developed.  HENT:     Head: Normocephalic.     Right Ear: Tympanic membrane normal.     Left Ear: Tympanic membrane normal.     Nose: Nose normal.     Mouth/Throat:     Mouth: Mucous membranes are moist.  Eyes:     Pupils: Pupils are equal, round, and reactive to light.  Neck:     Vascular: No carotid bruit or JVD.  Cardiovascular:     Rate and Rhythm: Normal rate and regular rhythm.     Heart sounds: Normal heart sounds.  Pulmonary:     Effort: Pulmonary effort is normal. No respiratory distress.     Breath sounds: Normal breath sounds. No wheezing or rales.  Chest:     Chest  wall: No tenderness.  Abdominal:     General: Bowel sounds are normal. There is no distension or abdominal bruit.     Palpations: Abdomen is soft. There is no hepatomegaly, splenomegaly, mass or pulsatile mass.     Tenderness: There is no abdominal tenderness.  Musculoskeletal:        General: Normal range of motion.     Cervical back: Normal range of motion and neck supple.  Lymphadenopathy:     Cervical: No cervical adenopathy.  Skin:    General: Skin is warm and dry.  Neurological:     Mental Status: She is alert and oriented to person, place, and time.     Deep Tendon Reflexes: Reflexes are normal and symmetric.  Psychiatric:        Behavior: Behavior normal.        Thought Content: Thought content normal.        Judgment: Judgment normal.    BP (!) 152/80   Pulse (!) 54   Temp 98.2 F (36.8 C) (Temporal)   Resp 20   Ht '5\' 6"'$  (1.676 m)   Wt 242 lb (109.8 kg)   SpO2 95%   BMI 39.06 kg/m        Assessment & Plan:   [name] in today with chief complaint  of Discuss meds (Had allergic reaction to BP meds per Urgent Care/)   1. Primary hypertension Low sadium diet Keep diary of blood pressure at home. Keep follow up appointment - losartan-hydrochlorothiazide (HYZAAR) 100-25 MG tablet; Take 1 tablet by mouth daily.  Dispense: 90 tablet; Refill: 1    The above assessment and management plan was discussed with the patient. The patient verbalized understanding of and has agreed to the management plan. Patient is aware to call the clinic if symptoms persist or worsen. Patient is aware when to return to the clinic for a follow-up visit. Patient educated on when it is appropriate to go to the emergency department.   [name] Hassell Done, FNP

## 2020-10-07 NOTE — Patient Instructions (Signed)

## 2020-10-10 ENCOUNTER — Telehealth: Payer: Self-pay | Admitting: Nurse Practitioner

## 2020-10-10 NOTE — Telephone Encounter (Signed)
119/64 is not to low. Continue to check daily and let me know if drops lower

## 2020-10-11 NOTE — Telephone Encounter (Signed)
Patient aware and verbalizes understanding. 

## 2020-11-11 ENCOUNTER — Other Ambulatory Visit: Payer: Self-pay

## 2020-11-11 ENCOUNTER — Ambulatory Visit (INDEPENDENT_AMBULATORY_CARE_PROVIDER_SITE_OTHER): Payer: PPO | Admitting: Nurse Practitioner

## 2020-11-11 ENCOUNTER — Encounter: Payer: Self-pay | Admitting: Nurse Practitioner

## 2020-11-11 VITALS — BP 110/64 | HR 58 | Temp 98.1°F | Resp 20 | Ht 66.0 in | Wt 242.0 lb

## 2020-11-11 DIAGNOSIS — D510 Vitamin B12 deficiency anemia due to intrinsic factor deficiency: Secondary | ICD-10-CM

## 2020-11-11 DIAGNOSIS — I82522 Chronic embolism and thrombosis of left iliac vein: Secondary | ICD-10-CM

## 2020-11-11 DIAGNOSIS — K219 Gastro-esophageal reflux disease without esophagitis: Secondary | ICD-10-CM | POA: Diagnosis not present

## 2020-11-11 DIAGNOSIS — D508 Other iron deficiency anemias: Secondary | ICD-10-CM | POA: Diagnosis not present

## 2020-11-11 DIAGNOSIS — I471 Supraventricular tachycardia: Secondary | ICD-10-CM | POA: Diagnosis not present

## 2020-11-11 DIAGNOSIS — I82402 Acute embolism and thrombosis of unspecified deep veins of left lower extremity: Secondary | ICD-10-CM

## 2020-11-11 DIAGNOSIS — I10 Essential (primary) hypertension: Secondary | ICD-10-CM | POA: Insufficient documentation

## 2020-11-11 DIAGNOSIS — Z7901 Long term (current) use of anticoagulants: Secondary | ICD-10-CM | POA: Diagnosis not present

## 2020-11-11 DIAGNOSIS — F3341 Major depressive disorder, recurrent, in partial remission: Secondary | ICD-10-CM

## 2020-11-11 MED ORDER — METOPROLOL TARTRATE 25 MG PO TABS
12.5000 mg | ORAL_TABLET | Freq: Two times a day (BID) | ORAL | 1 refills | Status: DC
Start: 2020-11-11 — End: 2021-10-02

## 2020-11-11 MED ORDER — OMEPRAZOLE 20 MG PO CPDR: 20.0000 mg | DELAYED_RELEASE_CAPSULE | Freq: Every day | ORAL | 1 refills | Status: DC

## 2020-11-11 MED ORDER — LOSARTAN POTASSIUM-HCTZ 100-25 MG PO TABS: 1.0000 | ORAL_TABLET | Freq: Every day | ORAL | 1 refills | Status: DC

## 2020-11-11 MED ORDER — XARELTO 20 MG PO TABS: 20.0000 mg | ORAL_TABLET | Freq: Every day | ORAL | 11 refills | Status: DC

## 2020-11-11 NOTE — Progress Notes (Signed)
Subjective:    Patient ID: [name], female    DOB: 04-17-1950, 70 y.o.   MRN: [id number]   Chief Complaint: Medical Management of Chronic Issues    HPI:  1. SVT (supraventricular tachycardia) ([place]) Denies heart racing or palpitations. Is on metoprolol.  2. Chronic deep vein thrombosis (DVT) of iliac vein of left lower extremity ([name]) Has no lower ext edema or pain. Is on xeralto and is doing well.  3. hypertension Blood pressure usually runs on low sideso she has cut the hyzaar in half and takes BID rather than daily.  4. Other iron deficiency anemia Lab Results  Component Value Date   HGB 13.6 06/12/2020     5. Pernicious anemia Lab Results  Component Value Date   [id number] 06/12/2020     6. Recurrent major depressive disorder, in partial remission ([place]) Is currently on no depression meds. Depression screen [facility] 2/9 11/11/2020 10/07/2020 06/25/2020  Decreased Interest 0 1 1  Down, Depressed, Hopeless 0 0 0  PHQ - 2 Score 0 1 1  Altered sleeping 0 2 0  Tired, decreased energy 0 2 0  Change in appetite 0 2 1  Feeling bad or failure about yourself  0 0 0  Trouble concentrating 0 0 0  Moving slowly or fidgety/restless 0 0 0  Suicidal thoughts 0 0 0  PHQ-9 Score 0 7 2  Difficult doing work/chores Not difficult at all Not difficult at all Not difficult at all  Some recent data might be hidden   7. GERD Is on omeprazole daily and is doing well.  8. OBesity No recent weight changes Wt Readings from Last 3 Encounters:  11/11/20 242 lb (109.8 kg)  10/07/20 242 lb (109.8 kg)  06/25/20 244 lb (110.7 kg)   BMI Readings from Last 3 Encounters:  11/11/20 39.06 kg/m  10/07/20 39.06 kg/m  06/25/20 39.38 kg/m      Outpatient Encounter Medications as of 11/11/2020  Medication Sig   acetaminophen (TYLENOL) 500 MG tablet Take 500-1,000 mg by mouth every 6 (six) hours as needed for moderate pain.    cyanocobalamin (,VITAMIN B-12,) 1000 MCG/ML injection  INJECT 1ML IM EVERY 30 DAYS   diphenhydrAMINE (BENADRYL) 25 mg capsule Take 25 mg by mouth every 6 (six) hours as needed for allergies (bee stings).    losartan-hydrochlorothiazide (HYZAAR) 100-25 MG tablet Take 1 tablet by mouth daily.   metoprolol tartrate (LOPRESSOR) 25 MG tablet Take 0.5 tablets (12.5 mg total) by mouth 2 (two) times daily. (Needs to be seen before next refill)   omeprazole (PRILOSEC) 20 MG capsule Take 1 capsule (20 mg total) by mouth daily. (Needs to be seen before next refill)   XARELTO 20 MG TABS tablet TAKE 1 TABLET DAILY WITH SUPPER   [DISCONTINUED] Probiotic Product (PROBIOTIC-10 ULTIMATE) CAPS Take by mouth.   No facility-administered encounter medications on file as of 11/11/2020.    Past Surgical History:  Procedure Laterality Date   ABDOMINAL HYSTERECTOMY  1989   BALLOON DILATION  12/11/2010   Procedure: BALLOON DILATION;  Surgeon: Rogene Houston, MD;  [facility];  Service: Endoscopy;  Laterality: N/A;   BOWEL RESECTION  01/05/2011   Procedure: SMALL BOWEL RESECTION;  Surgeon: Jamesetta So;  Location: [facility];  Service: General;;  Partial Small Bowel Resection   COLONOSCOPY  02/25/2012   Procedure: COLONOSCOPY;  Surgeon: Rogene Houston, MD;  Location: [facility];  Service: Endoscopy;  Laterality: N/A;  1200  GIVENS CAPSULE STUDY  01/02/2011   Procedure: GIVENS CAPSULE STUDY;  Surgeon: Rogene Houston, MD;  Location: [facility];  Service: Endoscopy;  Laterality: N/A;   LAPAROTOMY  01/05/2011   Procedure: EXPLORATORY LAPAROTOMY;  Surgeon: Jamesetta So;  Location: [facility];  Service: General;  Laterality: N/A;   OPEN REDUCTION INTERNAL FIXATION (ORIF) SCAPHOID WITH DISTAL RADIUS GRAFT Right 02/01/2016   Procedure: Right distal radius open reduction and internal fixation and repair as indicated;  Surgeon: Iran Planas, MD;  Location: [place];  Service: Orthopedics;  Laterality: Right;  Requests 90 mins    Family History  Problem Relation Age  of Onset   Depression Mother    Parkinson's disease Mother    Allergic rhinitis Brother    Angioedema Neg Hx    Asthma Neg Hx    Eczema Neg Hx    Immunodeficiency Neg Hx    Urticaria Neg Hx     New complaints: None today- ulcers on bil lower ext have completely healed  Social history: Lives with husband  Controlled substance contract: n/a     Review of Systems  Constitutional:  Negative for diaphoresis.  Eyes:  Negative for pain.  Respiratory:  Negative for shortness of breath.   Cardiovascular:  Negative for chest pain, palpitations and leg swelling.  Gastrointestinal:  Negative for abdominal pain.  Endocrine: Negative for polydipsia.  Skin:  Negative for rash.  Neurological:  Negative for dizziness, weakness and headaches.  Hematological:  Does not bruise/bleed easily.  All other systems reviewed and are negative.     Objective:   Physical Exam Vitals and nursing note reviewed.  Constitutional:      General: She is not in acute distress.    Appearance: Normal appearance. She is well-developed.  HENT:     Head: Normocephalic.     Right Ear: Tympanic membrane normal.     Left Ear: Tympanic membrane normal.     Nose: Nose normal.     Mouth/Throat:     Mouth: Mucous membranes are moist.  Eyes:     Pupils: Pupils are equal, round, and reactive to light.  Neck:     Vascular: No carotid bruit or JVD.  Cardiovascular:     Rate and Rhythm: Normal rate and regular rhythm.     Heart sounds: Normal heart sounds.  Pulmonary:     Effort: Pulmonary effort is normal. No respiratory distress.     Breath sounds: Normal breath sounds. No wheezing or rales.  Chest:     Chest wall: No tenderness.  Abdominal:     General: Bowel sounds are normal. There is no distension or abdominal bruit.     Palpations: Abdomen is soft. There is no hepatomegaly, splenomegaly, mass or pulsatile mass.     Tenderness: There is no abdominal tenderness.  Musculoskeletal:        General:  Normal range of motion.     Cervical back: Normal range of motion and neck supple.  Lymphadenopathy:     Cervical: No cervical adenopathy.  Skin:    General: Skin is warm and dry.  Neurological:     Mental Status: She is alert and oriented to person, place, and time.     Deep Tendon Reflexes: Reflexes are normal and symmetric.  Psychiatric:        Behavior: Behavior normal.        Thought Content: Thought content normal.        Judgment: Judgment normal.  BP 110/64   Pulse (!) 58   Temp 98.1 F (36.7 C) (Temporal)   Resp 20   Ht _0  (1.676 m)   Wt 242 lb (109.8 kg)   SpO2 97%   BMI 39.06 kg/m       Assessment & Plan:  ANZAL BARTNICK comes in today with chief complaint of Medical Management of Chronic Issues   Diagnosis and orders addressed:  1. SVT (supraventricular tachycardia) ([name]) Avoid caffeine - metoprolol tartrate (LOPRESSOR) 25 MG tablet; Take 0.5 tablets (12.5 mg total) by mouth 2 (two) times daily. (Needs to be seen before next refill)  Dispense: 180 tablet; Refill: 1  2. Chronic deep vein thrombosis (DVT) of iliac vein of left lower extremity ([name]) Report any calf pain or swelling  3. Other iron deficiency anemia Labs pending  4. Pernicious anemia Labs pending  5. Recurrent major depressive disorder, in partial remission ([name]) Stress ,management  6. Morbid obesity ([place]) Discussed diet and exercise for person with BMI >25 Will recheck weight in 3-6 months   7. Deep vein thrombosis (DVT) of left lower extremity, unspecified chronicity, unspecified vein ([name]) Report any bleeding problems - XARELTO 20 MG TABS tablet; Take 1 tablet (20 mg total) by mouth daily with supper.  Dispense: 30 tablet; Refill: 11  8. Chronic anticoagulation - XARELTO 20 MG TABS tablet; Take 1 tablet (20 mg total) by mouth daily with supper.  Dispense: 30 tablet; Refill: 11  9. Gastroesophageal reflux disease without esophagitis Avoid spicy foods Do not eat 2 hours  prior to bedtime  - omeprazole (PRILOSEC) 20 MG capsule; Take 1 capsule (20 mg total) by mouth daily. (Needs to be seen before next refill)  Dispense: 90 capsule; Refill: 1  10. Primary hypertension Low sodium diet - losartan-hydrochlorothiazide (HYZAAR) 100-25 MG tablet; Take 1 tablet by mouth daily.  Dispense: 90 tablet; Refill: 1 - CBC with Differential/Platelet - CMP14+EGFR - Lipid panel   Labs pending Health Maintenance reviewed Diet and exercise encouraged  Follow up plan: 6 months   [place], FNP

## 2020-11-11 NOTE — Patient Instructions (Signed)
Deep Vein Thrombosis Deep vein thrombosis (DVT) is a condition in which a blood clot forms in a deep vein, such as a vein in the lower leg, thigh, pelvis, or arm. Deep veins are veins in the deep venous system. A clot is blood that has thickened into a gel or solid. This condition is serious and can be life-threatening if the clot travels to the lungs and causes a blockage (pulmonary embolism) in the arteries of the lung. A DVT can also damage veins in the leg. This can lead to long-term, or chronic, venous disease, leg pain, swelling, discoloration, and ulcers or sores (post-thrombotic syndrome). What are the causes? This condition may be caused by: A slowdown of blood flow. Damage to a vein. A condition that causes blood to clot more easily, such as certain blood-clotting disorders. What increases the risk? The following factors may make you more likely to develop this condition: Having obesity. Being older, especially older than age 60. Being inactive (sedentary lifestyle) or not moving around. This may include: Sitting or lying down for longer than 4-6 hours other than to sleep at night. Being in the hospital, having major or lengthy surgery, or having a thin, flexible tube (central line catheter) placed in a large vein. Being pregnant, giving birth, or having recently given birth. Taking medicines that contain estrogen, such as birth control or hormone replacement therapy. Using products that contain nicotine or tobacco, especially if you use hormonal birth control. Having a history of blood clots or a blood-clotting disease, a blood vessel disease (peripheral vascular disease), or congestive heart disease. Having a history of cancer, especially if being treated with chemotherapy. What are the signs or symptoms? Symptoms of this condition include: Swelling, pain, pressure, or tenderness in an arm or a leg. An arm or a leg becoming warm, red, or discolored. A leg turning very pale. You may  have a large DVT. This is rare. If the clot is in your leg, you may notice symptoms more or have worse symptoms when you stand or walk. In some cases, there are no symptoms. How is this diagnosed? This condition is diagnosed with: Your medical history and a physical exam. Tests, such as: Blood tests to check how well your blood clots. Doppler ultrasound. This is the best way to find a DVT. Venogram. Contrast dye is injected into a vein, and X-rays are taken to check for clots. How is this treated? Treatment for this condition depends on: The cause of your DVT. The size and location of your DVT, or having more than one DVT. Your risk for bleeding or developing more clots. Other medical conditions you may have. Treatment may include: Taking a blood thinner, also called an anticoagulant, to prevent clots from forming and growing. Wearing compression stockings, if directed. Injecting medicines into the affected vein to break up the clot (catheter-directed thrombolysis). This is used only for severe DVT and only if a specialist recommends it. Specific surgical procedures, when DVT is severe or hard to treat. These may be done to: Isolate and remove your clot. Place an inferior vena cava (IVC) filter in a large vein to catch blood clots before they reach your lungs. You may get some medical treatments for 6 months or longer. Follow these instructions at home: If you are taking blood thinners: Talk with your health care provider before you take any medicines that contain aspirin or NSAIDs, such as ibuprofen. These medicines increase your risk for dangerous bleeding. Take your medicine exactly as   told, at the same time every day. Do not skip a dose. Do not take more than the prescribed dose. This is important. Ask your health care provider about foods and medicines that could change the way your blood thinner works (may interact). Avoid these foods and medicines if you are told to do so. Avoid  anything that may cause bleeding or bruising. You may bleed more easily while taking blood thinners. Be very careful when using knives, scissors, or other sharp objects. Use an electric razor instead of a blade. Avoid activities that could cause injury or bruising, and follow instructions for preventing falls. Tell your health care provider if you have had any internal bleeding, bleeding ulcers, or neurologic diseases, such as strokes or cerebral aneurysms. Wear a medical alert bracelet or carry a card that lists what medicines you take. General instructions Take over-the-counter and prescription medicines only as told by your health care provider. Return to your normal activities as told by your health care provider. Ask your health care provider what activities are safe for you. If recommended, wear compression stockings as told by your health care provider. These stockings help to prevent blood clots and reduce swelling in your legs. Keep all follow-up visits as told by your health care provider. This is important. Contact a health care provider if: You miss a dose of your blood thinner. You have new or worse pain, swelling, or redness in an arm or a leg. You have worsening numbness or tingling in an arm or a leg. You have unusual bruising. Get help right away if: You have signs or symptoms that a blood clot has moved to the lungs. These may include: Shortness of breath. Chest pain. Fast or irregular heartbeats (palpitations). Light-headedness or dizziness. Coughing up blood. You have signs or symptoms that your blood is too thin. These may include: Blood in your vomit, stool, or urine. A cut that will not stop bleeding. A menstrual period that is heavier than usual. A severe headache or confusion. These symptoms may represent a serious problem that is an emergency. Do not wait to see if the symptoms will go away. Get medical help right away. Call your local emergency services (911 in  the [place] Do not drive yourself to the hospital. Summary Deep vein thrombosis (DVT) happens when a blood clot forms in a deep vein. This may occur in the lower leg, thigh, pelvis, or arm. Symptoms affect the arm or leg and can include swelling, pain, tenderness, warmth, redness, or discoloration. This condition may be treated with medicines or compression stockings. In severe cases, surgery may be done. If you are taking blood thinners, take them exactly as told. Do not skip a dose. Do not take more than is prescribed. Get help right away if you have shortness of breath, chest pain, fast or irregular heartbeats, or blood in your vomit, urine, or stool. This information is not intended to replace advice given to you by your health care provider. Make sure you discuss any questions you have with your health care provider. Document Revised: 01/06/2019 Document Reviewed: 01/07/2019 Elsevier Patient Education  2022 Elsevier Inc.  

## 2020-11-18 ENCOUNTER — Telehealth: Payer: Self-pay

## 2020-11-18 NOTE — Telephone Encounter (Signed)
-----   Message from Chevis Pretty, [place] sent at 11/16/2020  8:18 AM EDT ----- Patient needs to come in and get ;abs done - did not do at appointment

## 2020-11-18 NOTE — Telephone Encounter (Signed)
Attempted to contact patient - NA °

## 2020-11-28 ENCOUNTER — Emergency Department (HOSPITAL_COMMUNITY): Payer: PPO

## 2020-11-28 ENCOUNTER — Emergency Department (HOSPITAL_COMMUNITY)
Admission: EM | Admit: 2020-11-28 | Discharge: 2020-11-29 | Disposition: A | Discharge status: Home / Self Care | Payer: PPO | Attending: Emergency Medicine | Admitting: Emergency Medicine

## 2020-11-28 DIAGNOSIS — W01198A Fall on same level from slipping, tripping and stumbling with subsequent striking against other object, initial encounter: Secondary | ICD-10-CM | POA: Diagnosis not present

## 2020-11-28 DIAGNOSIS — M25562 Pain in left knee: Secondary | ICD-10-CM | POA: Diagnosis not present

## 2020-11-28 DIAGNOSIS — S0990XA Unspecified injury of head, initial encounter: Secondary | ICD-10-CM | POA: Insufficient documentation

## 2020-11-28 DIAGNOSIS — I491 Atrial premature depolarization: Secondary | ICD-10-CM | POA: Diagnosis not present

## 2020-11-28 DIAGNOSIS — S82002A Unspecified fracture of left patella, initial encounter for closed fracture: Secondary | ICD-10-CM | POA: Insufficient documentation

## 2020-11-28 DIAGNOSIS — R079 Chest pain, unspecified: Secondary | ICD-10-CM | POA: Diagnosis not present

## 2020-11-28 DIAGNOSIS — I10 Essential (primary) hypertension: Secondary | ICD-10-CM | POA: Diagnosis not present

## 2020-11-28 DIAGNOSIS — R0902 Hypoxemia: Secondary | ICD-10-CM | POA: Diagnosis not present

## 2020-11-28 DIAGNOSIS — S0993XA Unspecified injury of face, initial encounter: Secondary | ICD-10-CM | POA: Diagnosis not present

## 2020-11-28 DIAGNOSIS — S8992XA Unspecified injury of left lower leg, initial encounter: Secondary | ICD-10-CM | POA: Diagnosis present

## 2020-11-28 DIAGNOSIS — S80919A Unspecified superficial injury of unspecified knee, initial encounter: Secondary | ICD-10-CM | POA: Diagnosis not present

## 2020-11-28 NOTE — ED Triage Notes (Signed)
Patient BIB [facility] from home when she want bent down and lost her balance, falling to the ground and landing on her left knee. Patient complains of swelling and left knee pain. Patient received 537mL NS, 4mg  morphine, 4mg  zofran PTA. 20g saline lock in left AC. Patient alert, oriented, and in no apparent distress at this time.

## 2020-11-29 ENCOUNTER — Emergency Department (HOSPITAL_COMMUNITY): Payer: PPO

## 2020-11-29 DIAGNOSIS — S0993XA Unspecified injury of face, initial encounter: Secondary | ICD-10-CM | POA: Diagnosis not present

## 2020-11-29 MED ORDER — OXYCODONE HCL 5 MG PO TABS: 5.0000 mg | ORAL_TABLET | ORAL | 0 refills | Status: DC | PRN

## 2020-11-29 NOTE — ED Provider Notes (Signed)
[facility] Emergency Department Provider Note MRN:  [id number]  Arrival date & time: 11/29/20     Chief Complaint   Fall   History of Present Illness   [name] is a 70 y.o. year-old female with a history of 5 Leiden, DVT presenting to the ED with chief complaint of fall.  Patient was rolling up a rug and she tripped and fell.  She struck her face against the cabinet.  She landed on her left knee.  She has chronic pain in this knee but it is much more severe since the fall.  Denies loss of consciousness, no neck or back pain, no nausea vomiting, no chest pain or shortness of breath, no abdominal pain, no hip pain.  Minimal pain to the left cheek area.  Otherwise no other complaints.  Review of Systems  A complete 10 system review of systems was obtained and all systems are negative except as noted in the HPI and PMH.   Patient's Health History    Past Medical History:  Diagnosis Date   Allergic urticaria 01/04/2011   Rash from tape.   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  Intolerant to [employer]. Skin Cancer Left hand   History of blood transfusion    Hypertension    Pernicious anemia 07/10/2010   Small bowel mass 01/03/2011   s/p surgery   Ulcer 05/2009   esophageal   Ventricular tachycardia 03/26/11   Vitamin B12 deficiency    vit b12 1000 mcg monthly    Past Surgical History:  Procedure Laterality Date   ABDOMINAL HYSTERECTOMY  1989   BALLOON DILATION  12/11/2010   Procedure: BALLOON DILATION;  Surgeon: Rogene Houston, MD;  [facility];  Service: Endoscopy;  Laterality: N/A;   BOWEL RESECTION  01/05/2011    Procedure: SMALL BOWEL RESECTION;  Surgeon: Jamesetta So;  Location: [facility];  Service: General;;  Partial Small Bowel Resection   COLONOSCOPY  02/25/2012   Procedure: COLONOSCOPY;  Surgeon: Rogene Houston, MD;  [facility];  Service: Endoscopy;  Laterality: N/A;  [place]  01/02/2011   Procedure: GIVENS CAPSULE STUDY;  Surgeon: Rogene Houston, MD;  [facility];  Service: Endoscopy;  Laterality: N/A;   LAPAROTOMY  01/05/2011   Procedure: EXPLORATORY LAPAROTOMY;  Surgeon: Jamesetta So;  Location: [facility];  Service: General;  Laterality: N/A;   OPEN REDUCTION INTERNAL FIXATION (ORIF) SCAPHOID WITH DISTAL RADIUS GRAFT Right 02/01/2016   Procedure: Right distal radius open reduction and internal fixation and repair as indicated;  Surgeon: Iran Planas, MD;  Location: [facility];  Service: Orthopedics;  Laterality: Right;  Requests 90 mins    Family History  Problem Relation Age of Onset   Depression Mother    Parkinson's disease Mother    Allergic rhinitis Brother    Angioedema Neg Hx    Asthma Neg Hx    Eczema Neg Hx    Immunodeficiency Neg Hx    Urticaria Neg Hx     Social History   Socioeconomic History   Marital  status: Married    Spouse name: Jeneen Rinks   Number of children: 3   Years of education: Not on file   Highest education level: Not on file  Occupational History    Employer: [employer]   Occupation: CNA    Employer: [employer]  Tobacco Use   Smoking status: Never   Smokeless tobacco: Never  Vaping Use   Vaping Use: Never used  Substance and Sexual Activity   Alcohol use: No   Drug use: No   Sexual activity: Not Currently  Other Topics Concern   Not on file  Social History Narrative   Not on file   Social Determinants of Health   Financial Resource Strain: Low Risk    Difficulty of Paying Living Expenses: Not hard at all  Food Insecurity: No Food Insecurity   Worried About Charity fundraiser in the Last Year: Never true    [place] in the Last Year: Never true  Transportation Needs: No Transportation Needs   Lack of Transportation (Medical): No   Lack of Transportation (Non-Medical): No  Physical Activity: Insufficiently Active   Days of Exercise per Week: 3 days   Minutes of Exercise per Session: 30 min  Stress: No Stress Concern Present   Feeling of Stress : Not at all  Social Connections: Socially Integrated   Frequency of Communication with Friends and Family: More than three times a week   Frequency of Social Gatherings with Friends and Family: Three times a week   Attends Religious Services: More than 4 times per year   Active Member of Clubs or Organizations: Yes   Attends Music therapist: More than 4 times per year   Marital Status: Married  Human resources officer Violence: Not At Risk   Fear of Current or Ex-Partner: No   Emotionally Abused: No   Physically Abused: No   Sexually Abused: No     Physical Exam   Vitals:   11/28/20 1804 11/28/20 2210  BP: 137/64 (!) 139/59  Pulse: 71 (!) 58  Resp: 13 18  Temp: 97.7 F (36.5 C)   SpO2: 99% 100%    CONSTITUTIONAL: Well-appearing, NAD NEURO:  Alert and oriented x 3, no focal deficits EYES:  eyes equal and reactive ENT/NECK:  no LAD, no JVD CARDIO: Regular rate, well-perfused, normal S1 and S2 PULM:  CTAB no wheezing or rhonchi GI/GU:  normal bowel sounds, non-distended, non-tender MSK/SPINE: Mild edema to the left knee with tenderness to palpation, neurovascularly intact distally SKIN:  no rash, atraumatic PSYCH:  Appropriate speech and behavior  *Additional and/or pertinent findings included in MDM below  Diagnostic and Interventional Summary    EKG Interpretation  Date/Time:    Ventricular Rate:    PR Interval:    QRS Duration:   QT Interval:    QTC Calculation:   R Axis:     Text Interpretation:         Labs Reviewed - No data to display  CT HEAD WO CONTRAST (5MM)  Final Result    DG Knee  Complete 4 Views Left  Final Result      Medications - No data to display   Procedures  /  Critical Care Procedures  ED Course and Medical Decision Making  I have reviewed the triage vital signs, the nursing notes, and pertinent available records from the EMR.  Listed above are laboratory and imaging tests that I personally ordered, reviewed, and interpreted and then considered in my  medical decision making (see below for details).  X-ray reveals patellar fracture.  Exam otherwise reassuring, NVIT.  Will place in knee immobilizer and she will follow-up with her orthopedic specialist.  She is on Xarelto and she hit her head, will obtain screening CT head to ensure no intracranial bleeding.  Anticipating discharge.       Barth Kirks. Sedonia Small, MD [place] [email or web address]  Final Clinical Impressions(s) / ED Diagnoses     ICD-10-CM   1. Closed nondisplaced fracture of left patella, unspecified fracture morphology, initial encounter  S82.002A       ED Discharge Orders          Ordered    oxyCODONE (ROXICODONE) 5 MG immediate release tablet  Every 4 hours PRN        11/29/20 0131             Discharge Instructions Discussed with and Provided to Patient:   Discharge Instructions   None       Maudie Flakes, MD 11/29/20 [phone]

## 2020-12-02 ENCOUNTER — Telehealth: Payer: Self-pay

## 2020-12-02 NOTE — Telephone Encounter (Signed)
Pt called to follow up with Dr. Durward Fortes in [place] regarding a recent fall that resulted in her fracturing her Lt patella. Pt is in a knee immobilizer and was told by the ED to follow up with her ortho. Since Dr. Durward Fortes will not be in [place] this week I put the patient on [place] schedule.   I just wanted to let you know what was going on. Please advise

## 2020-12-03 NOTE — Telephone Encounter (Signed)
Perfectly OK to see Dr Lorin Mercy

## 2020-12-05 ENCOUNTER — Other Ambulatory Visit: Payer: Self-pay

## 2020-12-05 ENCOUNTER — Ambulatory Visit (INDEPENDENT_AMBULATORY_CARE_PROVIDER_SITE_OTHER): Payer: PPO | Admitting: Orthopaedic Surgery

## 2020-12-05 ENCOUNTER — Encounter: Payer: Self-pay | Admitting: Orthopaedic Surgery

## 2020-12-05 DIAGNOSIS — S82032G Displaced transverse fracture of left patella, subsequent encounter for closed fracture with delayed healing: Secondary | ICD-10-CM | POA: Diagnosis not present

## 2020-12-05 DIAGNOSIS — M25562 Pain in left knee: Secondary | ICD-10-CM | POA: Insufficient documentation

## 2020-12-05 NOTE — Progress Notes (Signed)
Office Visit Note   Patient: [name]           Date of Birth: Dec 09, 1950           MRN: [id number] Visit Date: 12/05/2020              Requested by: Chevis Pretty, [address] [place],  [place] [address] PCP: Chevis Pretty, FNP   Assessment & Plan: Visit Diagnoses:  1. nondisplaced transverse fracture of left patella, subsequent encounter for closed fracture with delayed healing       Epic error ----left transverse closed patella fracture nondisplaced is correct diagnosis  Plan: Appropriate use of knee immobilizer discussed with straps and positioning.  Return 3 weeks for repeat x-rays [facility] and lateral left knee.  Follow-Up Instructions: Return in about 3 weeks (around 12/26/2020).   Orders:  No orders of the defined types were placed in this encounter.  No orders of the defined types were placed in this encounter.     Procedures: No procedures performed   Clinical Data: No additional findings.   Subjective: Chief Complaint  Patient presents with   Left Knee - Fracture    Fall 11/28/2020    HPI patient had a fall with left patella fracture inferior pole transverse nondisplaced.  She is placed in a knee immobilizer she states a traveling nurse thinks that and cut 1 brace and then had a second brace but it still was not right.  History of factor V deficiency and she has had problems with leg ulcerations.  She states the knee immobilizer was rubbing on it and she has been using a knee sleeve with a hole that she had.  Mild swelling in the knee she is using Tylenol for pain.  Review of Systems all other systems noncontributory.  Objective: Vital Signs: Ht 5\' 7"  (1.702 m)   Wt 242 lb (109.8 kg)   BMI 37.90 kg/m   Physical Exam Constitutional:      Appearance: She is well-developed.  HENT:     Head: Normocephalic.     Right Ear: External ear normal.     Left Ear: External ear normal. There is no impacted cerumen.  Eyes:     Pupils:  Pupils are equal, round, and reactive to light.  Neck:     Thyroid: No thyromegaly.     Trachea: No tracheal deviation.  Cardiovascular:     Rate and Rhythm: Normal rate.  Pulmonary:     Effort: Pulmonary effort is normal.  Abdominal:     Palpations: Abdomen is soft.  Musculoskeletal:     Cervical back: No rigidity.  Skin:    General: Skin is warm and dry.  Neurological:     Mental Status: She is alert and oriented to person, place, and time.  Psychiatric:        Behavior: Behavior normal.    Ortho Exam previous healed ulcers.  She has dilated veins below the knee without active skin ulceration.  Old skin graft adjacent to medial malleolus or medial scarring adjacent to medial malleolus.  Mild hemarthrosis left knee.  Specialty Comments:  No specialty comments available.  Imaging: Narrative & Impression  CLINICAL DATA:  Fall landing on left knee.  Left knee pain.   EXAM: LEFT KNEE - COMPLETE 4+ VIEW   COMPARISON:  None.   FINDINGS: Acute essentially nondisplaced lower patellar fracture, transversely oriented extending through the anterior and posterior cortex. No significant articular displacement. No other fracture. Normal alignment. Mild  tricompartmental osteoarthritis with medial tibiofemoral joint space narrowing. There is a small knee joint effusion.   IMPRESSION: 1. Acute nondisplaced lower patellar fracture. 2. Mild tricompartmental osteoarthritis with small joint effusion.     Electronically Signed   By: Keith Rake M.D.   On: 11/28/2020 18:16     PMFS History: Patient Active Problem List   Diagnosis Date Noted   Primary hypertension 11/11/2020   Morbid obesity ([place]) 11/11/2020   Gastroesophageal reflux disease without esophagitis 11/11/2020   Hypertension 11/11/2020   Unilateral primary osteoarthritis, left knee 05/26/2017   Depression 11/19/2014   SVT (supraventricular tachycardia) ([place]) 03/25/2011   GIST (gastrointestinal stromal tumor),  malignant ([place]) 01/03/2011   Chronic ulcer of left leg ([place]) 01/01/2011   Chronic anticoagulation 01/01/2011   Factor V Leiden ([place])    DVT (deep venous thrombosis) ([place]) 07/10/2010   Pernicious anemia 07/10/2010   Iron deficiency anemia 08/27/2009   Past Medical History:  Diagnosis Date   Allergic urticaria 01/04/2011   Rash from tape.   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  Intolerant to [employer]. Skin Cancer Left hand   History of blood transfusion    Hypertension    Pernicious anemia 07/10/2010   Small bowel mass 01/03/2011   s/p surgery   Ulcer 05/2009   esophageal   Ventricular tachycardia 03/26/11   Vitamin B12 deficiency    vit b12 1000 mcg monthly    Family History  Problem Relation Age of Onset   Depression Mother    Parkinson's disease Mother    Allergic rhinitis Brother    Angioedema Neg Hx    Asthma Neg Hx    Eczema Neg Hx    Immunodeficiency Neg Hx    Urticaria Neg Hx     Past Surgical History:  Procedure Laterality Date   ABDOMINAL HYSTERECTOMY  1989   BALLOON DILATION  12/11/2010   Procedure: BALLOON DILATION;  Surgeon: Rogene Houston, MD;  [facility];  Service: Endoscopy;  Laterality: N/A;   BOWEL RESECTION  01/05/2011   Procedure: SMALL BOWEL RESECTION;  Surgeon: Jamesetta So;  Location[facility];  Service: General;;  Partial Small Bowel Resection   COLONOSCOPY  02/25/2012   Procedure: COLONOSCOPY;  Surgeon: Rogene Houston, MD;  [facility];  Service: Endoscopy;  Laterality: N/A;  [place]  01/02/2011   Procedure: GIVENS CAPSULE STUDY;  Surgeon: Rogene Houston, MD;  [facility];   Service: Endoscopy;  Laterality: N/A;   LAPAROTOMY  01/05/2011   Procedure: EXPLORATORY LAPAROTOMY;  Surgeon: Jamesetta So;  Location: [facility];  Service: General;  Laterality: N/A;   OPEN REDUCTION INTERNAL FIXATION (ORIF) SCAPHOID WITH DISTAL RADIUS GRAFT Right 02/01/2016   Procedure: Right distal radius open reduction and internal fixation and repair as indicated;  Surgeon: Iran Planas, MD;  Location: [facility];  Service: Orthopedics;  Laterality: Right;  Requests 90 mins   Social History   Occupational History    Employer: [employer]   Occupation: CNA    Employer: [employer]  Tobacco Use   Smoking  status: Never   Smokeless tobacco: Never  Vaping Use   Vaping Use: Never used  Substance and Sexual Activity   Alcohol use: No   Drug use: No   Sexual activity: Not Currently

## 2020-12-23 ENCOUNTER — Inpatient Hospital Stay (HOSPITAL_COMMUNITY): Payer: PPO | Attending: Hematology

## 2020-12-23 DIAGNOSIS — I82522 Chronic embolism and thrombosis of left iliac vein: Secondary | ICD-10-CM | POA: Diagnosis not present

## 2020-12-23 DIAGNOSIS — Z7901 Long term (current) use of anticoagulants: Secondary | ICD-10-CM | POA: Insufficient documentation

## 2020-12-23 DIAGNOSIS — C49A3 Gastrointestinal stromal tumor of small intestine: Secondary | ICD-10-CM | POA: Insufficient documentation

## 2020-12-23 DIAGNOSIS — D6851 Activated protein C resistance: Secondary | ICD-10-CM | POA: Insufficient documentation

## 2020-12-23 DIAGNOSIS — D510 Vitamin B12 deficiency anemia due to intrinsic factor deficiency: Secondary | ICD-10-CM | POA: Diagnosis not present

## 2020-12-23 LAB — COMPREHENSIVE METABOLIC PANEL
ALT: 25 U/L (ref 0–44)
AST: 22 U/L (ref 15–41)
Albumin: 3.7 g/dL (ref 3.5–5.0)
Alkaline Phosphatase: 69 U/L (ref 38–126)
Anion gap: 8 (ref 5–15)
BUN: 14 mg/dL (ref 8–23)
CO2: 26 mmol/L (ref 22–32)
Calcium: 9.4 mg/dL (ref 8.9–10.3)
Chloride: 104 mmol/L (ref 98–111)
Creatinine, Ser: 0.78 mg/dL (ref 0.44–1.00)
GFR, Estimated: >60 mL/min (ref >60)
Glucose, Bld: 87 mg/dL (ref 70–99)
Potassium: 3.4 mmol/L — ABNORMAL LOW (ref 3.5–5.1)
Sodium: 138 mmol/L (ref 135–145)
Total Bilirubin: 0.3 mg/dL (ref 0.3–1.2)
Total Protein: 7.4 g/dL (ref 6.5–8.1)

## 2020-12-23 LAB — IRON AND TIBC
Iron: 93 ug/dL (ref 28–170)
Saturation Ratios: 30 % (ref 10.4–31.8)
TIBC: 305 ug/dL (ref 250–450)
UIBC: 212 ug/dL

## 2020-12-23 LAB — CBC WITH DIFFERENTIAL/PLATELET
Abs Immature Granulocytes: 0.03 10*3/uL (ref 0.00–0.07)
Basophils Absolute: 0 10*3/uL (ref 0.0–0.1)
Basophils Relative: 0 %
Eosinophils Absolute: 0.2 10*3/uL (ref 0.0–0.5)
Eosinophils Relative: 3 %
HCT: 42.4 % (ref 36.0–46.0)
Hemoglobin: 13.7 g/dL (ref 12.0–15.0)
Immature Granulocytes: 1 %
Lymphocytes Relative: 38 %
Lymphs Abs: 2.3 10*3/uL (ref 0.7–4.0)
MCH: 29.3 pg (ref 26.0–34.0)
MCHC: 32.3 g/dL (ref 30.0–36.0)
MCV: 90.6 fL (ref 80.0–100.0)
Monocytes Absolute: 0.7 10*3/uL (ref 0.1–1.0)
Monocytes Relative: 12 %
Neutro Abs: 2.8 10*3/uL (ref 1.7–7.7)
Neutrophils Relative %: 46 %
Platelets: 300 10*3/uL (ref 150–400)
RBC: 4.68 MIL/uL (ref 3.87–5.11)
RDW: 13.3 % (ref 11.5–15.5)
WBC: 6.1 10*3/uL (ref 4.0–10.5)
nRBC: 0 % (ref 0.0–0.2)

## 2020-12-23 LAB — VITAMIN B12: Vitamin B-12: 425 pg/mL (ref 180–914)

## 2020-12-23 LAB — FERRITIN: Ferritin: 68 ng/mL (ref 11–307)

## 2020-12-23 LAB — LACTATE DEHYDROGENASE: LDH: 139 U/L (ref 98–192)

## 2020-12-23 LAB — FOLATE: Folate: 12.8 ng/mL (ref >5.9)

## 2020-12-26 ENCOUNTER — Ambulatory Visit: Payer: Self-pay

## 2020-12-26 ENCOUNTER — Encounter: Payer: Self-pay | Admitting: Orthopaedic Surgery

## 2020-12-26 ENCOUNTER — Other Ambulatory Visit: Payer: Self-pay

## 2020-12-26 ENCOUNTER — Ambulatory Visit (INDEPENDENT_AMBULATORY_CARE_PROVIDER_SITE_OTHER): Payer: PPO | Admitting: Orthopaedic Surgery

## 2020-12-26 VITALS — Ht 67.5 in | Wt 242.0 lb

## 2020-12-26 DIAGNOSIS — M25562 Pain in left knee: Secondary | ICD-10-CM

## 2020-12-26 NOTE — Progress Notes (Signed)
Post-Op Visit Note   Patient: [name]           Date of Birth: December 06, 1950           MRN: [id number] Visit Date: 12/26/2020 PCP: Chevis Pretty, FNP   Assessment & Plan: 1 month post fall with nondisplaced patella fracture.  X-rays still demonstrate nondisplaced comminuted patella fracture.  She states is really not hurting she has slight limp using a compressive knee sleeve.  She stopped using the knee immobilizer.  She is avoiding going out when it slippery rainy, wet.  I will recheck her in 2 months with single lateral x-ray left knee on return.  Chief Complaint:  Chief Complaint  Patient presents with   Left Knee - Fracture, Follow-up    Fall 11/28/2020   Visit Diagnoses:  1. Left knee pain, unspecified chronicity     Plan: Continue weightbearing as tolerated return in 2 months for repeat x-ray lateral left knee 1 view only.  Follow-Up Instructions: Return in about 2 months (around 02/26/2021).   Orders:  Orders Placed This Encounter  Procedures   XR Knee 1-2 Views Left   No orders of the defined types were placed in this encounter.   Imaging: No results found.  PMFS History: Patient Active Problem List   Diagnosis Date Noted   Pain in left knee 12/05/2020   Primary hypertension 11/11/2020   Morbid obesity ([name]) 11/11/2020   Gastroesophageal reflux disease without esophagitis 11/11/2020   Hypertension 11/11/2020   Unilateral primary osteoarthritis, left knee 05/26/2017   Depression 11/19/2014   SVT (supraventricular tachycardia) ([name]) 03/25/2011   GIST (gastrointestinal stromal tumor), malignant ([name]) 01/03/2011   Chronic ulcer of left leg ([name]) 01/01/2011   Chronic anticoagulation 01/01/2011   Factor V Leiden ([name])    DVT (deep venous thrombosis) ([name]) 07/10/2010   Pernicious anemia 07/10/2010   Iron deficiency anemia 08/27/2009   Past Medical History:  Diagnosis Date   Allergic urticaria 01/04/2011   Rash from tape.   Anxiety    Cellulitis  of left leg 2006   Clotting disorder ([name])    heterozygosity from factor v leiden   DVT (deep venous thrombosis) ([name]) 07/10/2010   on coumadin   Dyspnea    with exertion   Factor V Leiden ([name])    GERD (gastroesophageal reflux disease)    GIST (gastrointestinal stromal tumor), malignant ([name]) 01/03/2011   S/P resection on 01/05/11.  Intolerant to [employer]. Skin Cancer Left hand   History of blood transfusion    Hypertension    Pernicious anemia 07/10/2010   Small bowel mass 01/03/2011   s/p surgery   Ulcer 05/2009   esophageal   Ventricular tachycardia 03/26/11   Vitamin B12 deficiency    vit b12 1000 mcg monthly    Family History  Problem Relation Age of Onset   Depression Mother    Parkinson's disease Mother    Allergic rhinitis Brother    Angioedema Neg Hx    Asthma Neg Hx    Eczema Neg Hx    Immunodeficiency Neg Hx    Urticaria Neg Hx     Past Surgical History:  Procedure Laterality Date   ABDOMINAL HYSTERECTOMY  1989   BALLOON DILATION  12/11/2010   Procedure: BALLOON DILATION;  Surgeon: Rogene Houston, MD;  [facility];  Service: Endoscopy;  Laterality: N/A;   BOWEL RESECTION  01/05/2011   Procedure: SMALL BOWEL RESECTION;  Surgeon: Jamesetta So;  Location: [facility];  Service: General;;  Partial Small Bowel Resection   COLONOSCOPY  02/25/2012   Procedure: COLONOSCOPY;  Surgeon: Rogene Houston, MD;  [facility];  Service: Endoscopy;  Laterality: N/A;  [place]  01/02/2011   Procedure: GIVENS CAPSULE STUDY;  Surgeon: Rogene Houston, MD;  [facility];  Service: Endoscopy;  Laterality: N/A;   LAPAROTOMY  01/05/2011   Procedure: EXPLORATORY LAPAROTOMY;  Surgeon: Jamesetta So;  Location: [facility];  Service: General;  Laterality: N/A;   OPEN REDUCTION INTERNAL FIXATION (ORIF) SCAPHOID WITH DISTAL RADIUS GRAFT Right 02/01/2016   Procedure: Right distal radius open reduction and internal fixation and repair as indicated;   Surgeon: Iran Planas, MD;  Location: [facility];  Service: Orthopedics;  Laterality: Right;  Requests 90 mins   Social History   Occupational History    Employer: [employer]   Occupation: CNA    Employer: [employer]  Tobacco Use   Smoking status: Never   Smokeless tobacco: Never  Vaping Use   Vaping Use: Never used  Substance and Sexual Activity   Alcohol use: No   Drug use: No   Sexual activity: Not Currently

## 2020-12-30 NOTE — Progress Notes (Signed)
[name], [place] [id number]   CLINIC:  Medical Oncology/Hematology  PCP:  Chevis Pretty, [place] / [place] [address] [phone]   REASON FOR VISIT:  Follow-up for factor V Leiden mutation AND GIST of small intestine  PRIOR THERAPY:  1. Partial small bowel resection on 01/05/2011. 2. Adjuvant imatinib stopped due to cardiac arrhythmias  NGS Results: not done  CURRENT THERAPY: Xarelto and observation  BRIEF ONCOLOGIC HISTORY:  Oncology History  GIST (gastrointestinal stromal tumor), malignant ([place])  01/03/2011 Initial Diagnosis   GIST (gastrointestinal stromal tumor), malignant ([place])   01/03/2011 Imaging   CT abd/pelvis- 5.0 cm soft tissue mass associated with distal small bowel. Findings characteristic of gastrointestinal stromal tumor. Adenocarcinoma or malignant transformation are not excluded.   Stable left adrenal adenoma.   04/03/2011 Imaging   CT abd/pelvis- Small bowel obstruction at small bowel anastomosis. Single loop of small bowel in the left mid abdomen shows wall thickening, nonspecific; this can be seen with infection, inflammatory bowel disease and ischemia. Free intraperitoneal fluid. Small right pleural effusion and bibasilar atelectasis. Left adrenal adenoma.   01/25/2012 Imaging   CT abd/pelvis- Prior small bowel resection with anastomoses in the left mid abdomen.  No evidence of metastatic disease in the abdomen/pelvis.   No evidence of bowel obstruction.   Focal eccentric wall thickening along the lateral aspect of the cecum.  Colonoscopy is suggested to exclude a primary colonic neoplasm.     01/25/2013 Imaging   CT abd/pelvis- 1. Thickening through the gastric cardiac region likely represents redundant folds of normal mucosa. Recommend attention on follow-up. 2. Mild haziness to the central mesentery is similar to and prior likely related to prior bowel surgery. No  evidence of GIST recurrence within the small bowel. 3. No evidence of adenopathy or mass in the abdomen or pelvis. 4. Stable left adrenal adenoma. 5. 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   01/15/2014 Imaging   CT abd/pelvis- Stable exam. No evidence for recurrent disease in the abdomen or pelvis.   Stable left adrenal adenoma.   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   01/10/2015 Imaging   CT abd/pelvis- 1. Stable exam. No evidence for recurrent disease within the abdomen or pelvis. 2. Stable left adrenal gland adenoma. 3. 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   01/23/2015 Imaging   CT abd/pelvis- Small bowel obstruction with a transition zone just distal to the ileo ileal anastomosis in the anterior pelvis and secondary to peritoneal adhesions.     CANCER STAGING: Cancer Staging  GIST (gastrointestinal stromal tumor), malignant ([place]) Staging form: Soft Tissue Sarcoma, AJCC 7th Edition - Clinical: Stage IIB (T2, N0, M0) - Signed by Baird Cancer, PA on 01/29/2011   INTERVAL HISTORY:  [name], a 70 y.o. female, returns for routine follow-up of her factor V Leiden mutation AND GIST of small intestine. [name] was last seen on 12/26/2019.   Today she reports feeling good. She denies abdominal pain and cramping. She is taking Xarelto and tolerating it well.   REVIEW OF SYSTEMS:  Review of Systems  Constitutional:  Negative for appetite change and fatigue.  Gastrointestinal:  Negative for abdominal pain.  All other systems reviewed and are negative.  PAST MEDICAL/SURGICAL HISTORY:  Past Medical History:  Diagnosis Date   Allergic urticaria 01/04/2011   Rash from tape.   Anxiety  Cellulitis of left leg 2006   Clotting disorder ([place])    heterozygosity from factor v leiden   DVT (deep venous thrombosis) ([place]) 07/10/2010   on coumadin   Dyspnea    with  exertion   Factor V Leiden ([name])    GERD (gastroesophageal reflux disease)    GIST (gastrointestinal stromal tumor), malignant ([place]) 01/03/2011   S/P resection on 01/05/11.  Intolerant to [employer]. Skin Cancer Left hand   History of blood transfusion    Hypertension    Pernicious anemia 07/10/2010   Small bowel mass 01/03/2011   s/p surgery   Ulcer 05/2009   esophageal   Ventricular tachycardia 03/26/11   Vitamin B12 deficiency    vit b12 1000 mcg monthly   Past Surgical History:  Procedure Laterality Date   ABDOMINAL HYSTERECTOMY  1989   BALLOON DILATION  12/11/2010   Procedure: BALLOON DILATION;  Surgeon: Rogene Houston, MD;  [facility];  Service: Endoscopy;  Laterality: N/A;   BOWEL RESECTION  01/05/2011   Procedure: SMALL BOWEL RESECTION;  Surgeon: Jamesetta So;  Location: [facility];  Service: General;;  Partial Small Bowel Resection   COLONOSCOPY  02/25/2012   Procedure: COLONOSCOPY;  Surgeon: Rogene Houston, MD;  [facility];  Service: Endoscopy;  Laterality: N/A;  [place]  01/02/2011   Procedure: GIVENS CAPSULE STUDY;  Surgeon: Rogene Houston, MD;  [facility];  Service: Endoscopy;  Laterality: N/A;   LAPAROTOMY  01/05/2011   Procedure: EXPLORATORY LAPAROTOMY;  Surgeon: Jamesetta So;  Location: [facility];  Service: General;  Laterality: N/A;   OPEN REDUCTION INTERNAL FIXATION (ORIF) SCAPHOID WITH DISTAL RADIUS GRAFT Right 02/01/2016   Procedure: Right distal radius open reduction and internal fixation and repair as indicated;  Surgeon: Iran Planas, MD;  Location: [facility];  Service: Orthopedics;  Laterality: Right;  Requests 90 mins    SOCIAL HISTORY:  Social History   Socioeconomic History   Marital status: Married    Spouse name: Jeneen Rinks   Number of children: 3   Years of education: Not on file   Highest education level: Not on file  Occupational History    Employer: [employer]   Occupation: CNA    Employer: [employer]  Tobacco Use   Smoking status: Never   Smokeless tobacco: Never  Vaping Use   Vaping Use: Never used  Substance and Sexual Activity   Alcohol use: No   Drug use: No   Sexual activity: Not Currently  Other Topics Concern   Not on file  Social History Narrative   Not on file   Social Determinants of Health   Financial Resource Strain: Not on file  Food Insecurity: Not on file  Transportation Needs: Not on file  Physical Activity: Not on file  Stress: Not on file  Social Connections: Not on file  Intimate Partner Violence: Not on file    FAMILY HISTORY:  Family History  Problem Relation Age of Onset   Depression Mother    Parkinson's disease Mother    Allergic rhinitis Brother    Angioedema Neg Hx    Asthma Neg Hx    Eczema Neg Hx    Immunodeficiency Neg Hx    Urticaria Neg Hx     CURRENT MEDICATIONS:  Current Outpatient Medications  Medication Sig Dispense Refill   acetaminophen (TYLENOL) 500 MG tablet Take 500-1,000 mg by mouth every 6 (six) hours as needed for  moderate pain.      cyanocobalamin (,VITAMIN B-12,) 1000 MCG/ML injection INJECT 1ML IM EVERY 30 DAYS 1 mL 11   diphenhydrAMINE (BENADRYL) 25 mg capsule Take 25 mg by mouth every 6 (six) hours as needed for allergies (bee stings).      losartan-hydrochlorothiazide (HYZAAR) 100-25 MG tablet Take 1 tablet by mouth daily. 90 tablet 1   metoprolol tartrate (LOPRESSOR) 25 MG tablet Take 0.5 tablets (12.5 mg total) by mouth 2 (two) times daily. (Needs to be seen before next refill) 180 tablet 1   omeprazole (PRILOSEC) 20 MG capsule Take 1 capsule (20 mg total) by mouth daily. (Needs to be seen before next refill) 90 capsule 1   oxyCODONE (ROXICODONE) 5 MG immediate release tablet Take 1 tablet (5 mg total) by mouth every 4 (four) hours as needed for severe pain. (Patient not taking: Reported on 12/05/2020) 8 tablet 0   XARELTO 20 MG TABS tablet Take 1 tablet (20 mg total) by mouth daily with supper. 30 tablet 11    No current facility-administered medications for this visit.    ALLERGIES:  Allergies  Allergen Reactions   Bee Venom Swelling and Rash    SWELLING REACTION UNSPECIFIED  SWELLING REACTION UNSPECIFIED    Cephalexin Swelling    PATIENT WITH Rx OF ANAPHYLAXIS TO PCN's SWELLING REACTION UNSPECIFIED  PATIENT WITH Rx OF ANAPHYLAXIS TO PCN's SWELLING REACTION UNSPECIFIED    Dexlansoprazole Swelling    SWELLING REACTION UNSPECIFIED  SWELLING REACTION UNSPECIFIED    Imatinib Other (See Comments)    Cardiac dysrhythmia   Other Swelling    PECANS MOUTH SWELLS   Penicillins Anaphylaxis    Has patient had a PCN reaction causing immediate rash, facial/tongue/throat swelling, SOB or lightheadedness with hypotension: No no Has patient had a PCN reaction causing severe rash involving mucus membranes or skin necrosis: No Has patient had a PCN reaction that required hospitalization No Has patient had a PCN reaction occurring within the last 10 years: No If all of the above answers are "NO", then may proceed with Cephalosporin use.    Ace Inhibitors Other (See Comments)    angioedema   Doxycycline Hyclate    Latex Itching   Nylon Rash   Sulfa Antibiotics Rash   Sulfonamide Derivatives Rash   Tape Itching    Paper tape is ok Paper tape is ok    PHYSICAL EXAM:  Performance status (ECOG): 1 - Symptomatic but completely ambulatory  There were no vitals filed for this visit. Wt Readings from Last 3 Encounters:  12/26/20 242 lb (109.8 kg)  12/05/20 242 lb (109.8 kg)  11/11/20 242 lb (109.8 kg)   Physical Exam Vitals reviewed.  Constitutional:      Appearance: Normal appearance.  Cardiovascular:     Rate and Rhythm: Normal rate and regular rhythm.     Pulses: Normal pulses.     Heart sounds: Normal heart sounds.  Pulmonary:     Effort: Pulmonary effort is normal.     Breath sounds: Normal breath sounds.  Abdominal:     Palpations: Abdomen is soft. There is no hepatomegaly,  splenomegaly or mass.     Tenderness: There is no abdominal tenderness.  Musculoskeletal:     Right lower leg: No edema.     Left lower leg: No edema.  Lymphadenopathy:     Cervical: No cervical adenopathy.     Right cervical: No superficial cervical adenopathy.    Left cervical: No superficial cervical adenopathy.  Upper Body:     Right upper body: No supraclavicular adenopathy.     Left upper body: No supraclavicular adenopathy.  Neurological:     General: No focal deficit present.     Mental Status: She is alert and oriented to person, place, and time.  Psychiatric:        Mood and Affect: Mood normal.        Behavior: Behavior normal.     LABORATORY DATA:  I have reviewed the labs as listed.  CBC Latest Ref Rng & Units 12/23/2020 06/12/2020 12/19/2019  WBC 4.0 - 10.5 K/uL 6.1 7.1 6.4  Hemoglobin 12.0 - 15.0 g/dL 13.7 13.6 13.9  Hematocrit 36.0 - 46.0 % 42.4 41.3 45.3  Platelets 150 - 400 K/uL 300 290 305   CMP Latest Ref Rng & Units 12/23/2020 06/12/2020 12/19/2019  Glucose 70 - 99 mg/dL 87 93 95  BUN 8 - 23 mg/dL 14 14 14   Creatinine 0.44 - 1.00 mg/dL 0.78 0.70 0.68  Sodium 135 - 145 mmol/L 138 143 139  Potassium 3.5 - 5.1 mmol/L 3.4(L) 4.6 4.6  Chloride 98 - 111 mmol/L 104 107(H) 105  CO2 22 - 32 mmol/L 26 23 27   Calcium 8.9 - 10.3 mg/dL 9.4 8.9 9.2  Total Protein 6.5 - 8.1 g/dL 7.4 7.2 7.5  Total Bilirubin 0.3 - 1.2 mg/dL 0.3 <0.2 0.5  Alkaline Phos 38 - 126 U/L 69 84 67  AST 15 - 41 U/L 22 17 22   ALT 0 - 44 U/L 25 19 24     DIAGNOSTIC IMAGING:  I have independently reviewed the scans and discussed with the patient. No results found.   ASSESSMENT:  1.  T3N0 Gist of small bowel: -Status post resection on 01/05/2011, pathology showing 5.8 cm, 0/8 lymph nodes involved, margins negative, 1 mitosis/50 HPF, intermediate grade.  Presentation was with GI bleed. -Adjuvant imatinib complicated by cardiac arrhythmias. -Last CT CAP dated 01/31/2017 did not show any  evidence of metastatic disease.   2.  Recurrent DVT: -She was on warfarin for 32 years and was switched to Xarelto in August 2018. - She also has factor V Leiden heterozygosity..   3.  Pernicious anemia: -She is on B12 injections monthly.   4.  Health maintenance: -Mammogram on 05/18/2019 was BI-RADS Category 1.   PLAN:  1.  T3N0 Gist of small bowel: -Initial presentation was with GI bleed. - She denies any GI bleed at this time.  No abdominal pains or masses. - RTC 1 year for follow-up.   2.  Recurrent DVT: -Continue indefinite Xarelto.  No bleeding issues.   3.  Pernicious anemia: -Hemoglobin is 13.7 and B12 425. - Continue B12 injections every 4 weeks.   Orders placed this encounter:  No orders of the defined types were placed in this encounter.    Derek Jack, MD [place] [phone]   I, Thana Ates, am acting as a scribe for Dr. Derek Jack.  I, Derek Jack MD, have reviewed the above documentation for accuracy and completeness, and I agree with the above.

## 2020-12-31 ENCOUNTER — Inpatient Hospital Stay (HOSPITAL_COMMUNITY): Payer: PPO | Attending: Hematology | Admitting: Hematology

## 2020-12-31 ENCOUNTER — Other Ambulatory Visit: Payer: Self-pay

## 2020-12-31 VITALS — BP 114/68 | HR 70 | Temp 97.4°F | Resp 20 | Ht 66.0 in | Wt 244.8 lb

## 2020-12-31 DIAGNOSIS — D510 Vitamin B12 deficiency anemia due to intrinsic factor deficiency: Secondary | ICD-10-CM | POA: Diagnosis not present

## 2020-12-31 DIAGNOSIS — C49A3 Gastrointestinal stromal tumor of small intestine: Secondary | ICD-10-CM | POA: Insufficient documentation

## 2020-12-31 DIAGNOSIS — I499 Cardiac arrhythmia, unspecified: Secondary | ICD-10-CM | POA: Insufficient documentation

## 2020-12-31 DIAGNOSIS — I10 Essential (primary) hypertension: Secondary | ICD-10-CM | POA: Insufficient documentation

## 2020-12-31 DIAGNOSIS — D6851 Activated protein C resistance: Secondary | ICD-10-CM | POA: Diagnosis not present

## 2020-12-31 DIAGNOSIS — E538 Deficiency of other specified B group vitamins: Secondary | ICD-10-CM | POA: Diagnosis not present

## 2020-12-31 DIAGNOSIS — Z86718 Personal history of other venous thrombosis and embolism: Secondary | ICD-10-CM | POA: Insufficient documentation

## 2020-12-31 DIAGNOSIS — Z7901 Long term (current) use of anticoagulants: Secondary | ICD-10-CM | POA: Diagnosis not present

## 2020-12-31 DIAGNOSIS — Z90710 Acquired absence of both cervix and uterus: Secondary | ICD-10-CM | POA: Diagnosis not present

## 2020-12-31 NOTE — Patient Instructions (Addendum)
[facility] Discharge Instructions   You were seen and examined today by Dr. Delton Coombes. He reviewed your lab results which were normal/stable. Continue monthly B12 injections. Return as scheduled for lab work and office visit.     Thank you for choosing [facility] to provide your oncology and hematology care.  To afford each patient quality time with our provider, please arrive at least 15 minutes before your scheduled appointment time.   If you have a lab appointment with the [facility] please come in thru the Main Entrance and check in at the main information desk.  You need to re-schedule your appointment should you arrive 10 or more minutes late.  We strive to give you quality time with our providers, and arriving late affects you and other patients whose appointments are after yours.  Also, if you no show three or more times for appointments you may be dismissed from the clinic at the providers discretion.     Again, thank you for choosing [facility].  Our hope is that these requests will decrease the amount of time that you wait before being seen by our physicians.       _____________________________________________________________  Should you have questions after your visit to [facility], please contact our office at [phone] and follow the prompts.  Our office hours are 8:00 a.m. and 4:30 p.m. Monday - Friday.  Please note that voicemails left after 4:00 p.m. may not be returned until the following business day.  We are closed weekends and major holidays.  You do have access to a nurse 24-7, just call the main number to the clinic [phone] and do not press any options, hold on the line and a nurse will answer the phone.    For prescription refill requests, have your pharmacy contact our office and allow 72 hours.    Due to Covid, you will need to wear a mask upon  entering the hospital. If you do not have a mask, a mask will be given to you at the Main Entrance upon arrival. For doctor visits, patients may have 1 support person age 27 or older with them. For treatment visits, patients can not have anyone with them due to social distancing guidelines and our immunocompromised population.

## 2021-02-05 ENCOUNTER — Ambulatory Visit (INDEPENDENT_AMBULATORY_CARE_PROVIDER_SITE_OTHER): Payer: PPO

## 2021-02-05 VITALS — Ht 67.0 in | Wt 244.0 lb

## 2021-02-05 DIAGNOSIS — Z0000 Encounter for general adult medical examination without abnormal findings: Secondary | ICD-10-CM | POA: Diagnosis not present

## 2021-02-05 NOTE — Patient Instructions (Signed)
[name] , Thank you for taking time to come for your Medicare Wellness Visit. I appreciate your ongoing commitment to your health goals. Please review the following plan we discussed and let me know if I can assist you in the future.   Screening recommendations/referrals: Colonoscopy: Done 02/25/2012 Repeat in 10 years  Mammogram: Done 05/22/2020 Repeat annually  Bone Density: Due. Call at your convenience to schedule.   Recommended yearly ophthalmology/optometry visit for glaucoma screening and checkup Recommended yearly dental visit for hygiene and checkup  Vaccinations: Influenza vaccine: Done 11/18/2020. Repeat annually  Pneumococcal vaccine: Due. Tdap vaccine: Due. Repeat in 10 years  Shingles vaccine: Shingrix discussed. Please contact your pharmacy for coverage information.     Covid-19:Done 02/13/2019, 03/23/2019, 12/05/2019.  Advanced directives: Advance directive discussed with you today. Even though you declined this today, please call our office should you change your mind, and we can give you the proper paperwork for you to fill out.   Conditions/risks identified: Aim for 30 minutes of exercise or brisk walking each day, drink 6-8 glasses of water and eat lots of fruits and vegetables. KEEP UP THE GOOD WORK!!  Next appointment: Follow up in one year for your annual wellness visit 2024.   Preventive Care 27 Years and Older, Female Preventive care refers to lifestyle choices and visits with your health care provider that can promote health and wellness. What does preventive care include? A yearly physical exam. This is also called an annual well check. Dental exams once or twice a year. Routine eye exams. Ask your health care provider how often you should have your eyes checked. Personal lifestyle choices, including: Daily care of your teeth and gums. Regular physical activity. Eating a healthy diet. Avoiding tobacco and drug use. Limiting alcohol use. Practicing  safe sex. Taking low-dose aspirin every day. Taking vitamin and mineral supplements as recommended by your health care provider. What happens during an annual well check? The services and screenings done by your health care provider during your annual well check will depend on your age, overall health, lifestyle risk factors, and family history of disease. Counseling  Your health care provider may ask you questions about your: Alcohol use. Tobacco use. Drug use. Emotional well-being. Home and relationship well-being. Sexual activity. Eating habits. History of falls. Memory and ability to understand (cognition). Work and work Statistician. Reproductive health. Screening  You may have the following tests or measurements: Height, weight, and BMI. Blood pressure. Lipid and cholesterol levels. These may be checked every 5 years, or more frequently if you are over 48 years old. Skin check. Lung cancer screening. You may have this screening every year starting at age 76 if you have a 30-pack-year history of smoking and currently smoke or have quit within the past 15 years. Fecal occult blood test (FOBT) of the stool. You may have this test every year starting at age 18. Flexible sigmoidoscopy or colonoscopy. You may have a sigmoidoscopy every 5 years or a colonoscopy every 10 years starting at age 50. Hepatitis C blood test. Hepatitis B blood test. Sexually transmitted disease (STD) testing. Diabetes screening. This is done by checking your blood sugar (glucose) after you have not eaten for a while (fasting). You may have this done every 1-3 years. Bone density scan. This is done to screen for osteoporosis. You may have this done starting at age 60. Mammogram. This may be done every 1-2 years. Talk to your health care provider about how often you should have regular  mammograms. Talk with your health care provider about your test results, treatment options, and if necessary, the need for more  tests. Vaccines  Your health care provider may recommend certain vaccines, such as: Influenza vaccine. This is recommended every year. Tetanus, diphtheria, and acellular pertussis (Tdap, Td) vaccine. You may need a Td booster every 10 years. Zoster vaccine. You may need this after age 70. Pneumococcal 13-valent conjugate (PCV13) vaccine. One dose is recommended after age 61. Pneumococcal polysaccharide (PPSV23) vaccine. One dose is recommended after age 49. Talk to your health care provider about which screenings and vaccines you need and how often you need them. This information is not intended to replace advice given to you by your health care provider. Make sure you discuss any questions you have with your health care provider. Document Released: 02/08/2015 Document Revised: 10/02/2015 Document Reviewed: 11/13/2014 Elsevier Interactive Patient Education  2017 [place] Prevention in the Home Falls can cause injuries. They can happen to people of all ages. There are many things you can do to make your home safe and to help prevent falls. What can I do on the outside of my home? Regularly fix the edges of walkways and driveways and fix any cracks. Remove anything that might make you trip as you walk through a door, such as a raised step or threshold. Trim any bushes or trees on the path to your home. Use bright outdoor lighting. Clear any walking paths of anything that might make someone trip, such as rocks or tools. Regularly check to see if handrails are loose or broken. Make sure that both sides of any steps have handrails. Any raised decks and porches should have guardrails on the edges. Have any leaves, snow, or ice cleared regularly. Use sand or salt on walking paths during winter. Clean up any spills in your garage right away. This includes oil or grease spills. What can I do in the bathroom? Use night lights. Install grab bars by the toilet and in the tub and shower.  Do not use towel bars as grab bars. Use non-skid mats or decals in the tub or shower. If you need to sit down in the shower, use a plastic, non-slip stool. Keep the floor dry. Clean up any water that spills on the floor as soon as it happens. Remove soap buildup in the tub or shower regularly. Attach bath mats securely with double-sided non-slip rug tape. Do not have throw rugs and other things on the floor that can make you trip. What can I do in the bedroom? Use night lights. Make sure that you have a light by your bed that is easy to reach. Do not use any sheets or blankets that are too big for your bed. They should not hang down onto the floor. Have a firm chair that has side arms. You can use this for support while you get dressed. Do not have throw rugs and other things on the floor that can make you trip. What can I do in the kitchen? Clean up any spills right away. Avoid walking on wet floors. Keep items that you use a lot in easy-to-reach places. If you need to reach something above you, use a strong step stool that has a grab bar. Keep electrical cords out of the way. Do not use floor polish or wax that makes floors slippery. If you must use wax, use non-skid floor wax. Do not have throw rugs and other things on the floor that can  make you trip. What can I do with my stairs? Do not leave any items on the stairs. Make sure that there are handrails on both sides of the stairs and use them. Fix handrails that are broken or loose. Make sure that handrails are as long as the stairways. Check any carpeting to make sure that it is firmly attached to the stairs. Fix any carpet that is loose or worn. Avoid having throw rugs at the top or bottom of the stairs. If you do have throw rugs, attach them to the floor with carpet tape. Make sure that you have a light switch at the top of the stairs and the bottom of the stairs. If you do not have them, ask someone to add them for you. What else  can I do to help prevent falls? Wear shoes that: Do not have high heels. Have rubber bottoms. Are comfortable and fit you well. Are closed at the toe. Do not wear sandals. If you use a stepladder: Make sure that it is fully opened. Do not climb a closed stepladder. Make sure that both sides of the stepladder are locked into place. Ask someone to hold it for you, if possible. Clearly mark and make sure that you can see: Any grab bars or handrails. First and last steps. Where the edge of each step is. Use tools that help you move around (mobility aids) if they are needed. These include: Canes. Walkers. Scooters. Crutches. Turn on the lights when you go into a dark area. Replace any light bulbs as soon as they burn out. Set up your furniture so you have a clear path. Avoid moving your furniture around. If any of your floors are uneven, fix them. If there are any pets around you, be aware of where they are. Review your medicines with your doctor. Some medicines can make you feel dizzy. This can increase your chance of falling. Ask your doctor what other things that you can do to help prevent falls. This information is not intended to replace advice given to you by your health care provider. Make sure you discuss any questions you have with your health care provider. Document Released: 11/08/2008 Document Revised: 06/20/2015 Document Reviewed: 02/16/2014 Elsevier Interactive Patient Education  2017 [employer].

## 2021-02-05 NOTE — Progress Notes (Signed)
Subjective:   [name] is a 71 y.o. female who presents for Medicare Annual (Subsequent) preventive examination. Virtual Visit via Telephone Note  I connected with  [name] on 02/05/21 at  9:00 AM EST by telephone and verified that I am speaking with the correct person using two identifiers.  Location: Patient: Hom Provider: [facility] Persons participating in the virtual visit: patient/Nurse Health Advisor   I discussed the limitations, risks, security and privacy concerns of performing an evaluation and management service by telephone and the availability of in person appointments. The patient expressed understanding and agreed to proceed.  Interactive audio and video telecommunications were attempted between this nurse and patient, however failed, due to patient having technical difficulties OR patient did not have access to video capability.  We continued and completed visit with audio only.  Some vital signs may be absent or patient reported.   Chriss Driver, LPN  Review of Systems     Cardiac Risk Factors include: advanced age (>35men, >48 women);hypertension;sedentary lifestyle;obesity (BMI >30kg/m2);Other (see comment), Risk factor comments: History of DVT, Stomach Cancer, SVT.     Objective:    Today's Vitals   02/05/21 0902  Weight: 244 lb (110.7 kg)  Height: 5\' 7"  (1.702 m)   Body mass index is 38.22 kg/m.  Advanced Directives 02/05/2021 12/31/2020 12/26/2019 07/18/2019 05/31/2019 01/03/2019 07/18/2018  Does Patient Have a Medical Advance Directive? No No No No No No No  Does patient want to make changes to medical advance directive? - - - No - Patient declined - - -  Would patient like information on creating a medical advance directive? No - Patient declined No - Patient declined No - Patient declined No - Patient declined No - Patient declined No - Patient declined No - Patient declined  Pre-existing out of facility DNR order (yellow form or pink MOST  form) - - - - - - -    Current Medications (verified) Outpatient Encounter Medications as of 02/05/2021  Medication Sig   acetaminophen (TYLENOL) 500 MG tablet Take 500-1,000 mg by mouth every 6 (six) hours as needed for moderate pain.    cyanocobalamin (,VITAMIN B-12,) 1000 MCG/ML injection INJECT 1ML IM EVERY 30 DAYS   diphenhydrAMINE (BENADRYL) 25 mg capsule Take 25 mg by mouth every 6 (six) hours as needed for allergies (bee stings).    losartan-hydrochlorothiazide (HYZAAR) 100-25 MG tablet Take 1 tablet by mouth daily.   metoprolol tartrate (LOPRESSOR) 25 MG tablet Take 0.5 tablets (12.5 mg total) by mouth 2 (two) times daily. (Needs to be seen before next refill)   omeprazole (PRILOSEC) 20 MG capsule Take 1 capsule (20 mg total) by mouth daily. (Needs to be seen before next refill)   XARELTO 20 MG TABS tablet Take 1 tablet (20 mg total) by mouth daily with supper.   No facility-administered encounter medications on file as of 02/05/2021.    Allergies (verified) Bee venom, Cephalexin, Dexlansoprazole, Imatinib, Other, Penicillins, Ace inhibitors, Doxycycline hyclate, Latex, Nylon, Sulfa antibiotics, Sulfonamide derivatives, and Tape   History: Past Medical History:  Diagnosis Date   Allergic urticaria 01/04/2011   Rash from tape.   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  Intolerant to [employer]. Skin Cancer Left hand   History of blood transfusion    Hypertension    Pernicious anemia 07/10/2010   Small bowel mass 01/03/2011   s/p surgery   Ulcer 05/2009   esophageal   Ventricular tachycardia 03/26/11   Vitamin B12 deficiency    vit b12 1000 mcg monthly   Past  Surgical History:  Procedure Laterality Date   ABDOMINAL HYSTERECTOMY  1989   BALLOON DILATION  12/11/2010   Procedure: BALLOON DILATION;  Surgeon: Rogene Houston, MD;  [facility];  Service: Endoscopy;  Laterality: N/A;   BOWEL RESECTION  01/05/2011   Procedure: SMALL BOWEL RESECTION;  Surgeon: Jamesetta So;  Location: [facility];  Service: General;;  Partial Small Bowel Resection   COLONOSCOPY  02/25/2012   Procedure: COLONOSCOPY;  Surgeon: Rogene Houston, MD;  [facility];  Service: Endoscopy;  Laterality: N/A;  [place]  01/02/2011   Procedure: GIVENS CAPSULE STUDY;  Surgeon: Rogene Houston, MD;  [facility];  Service: Endoscopy;  Laterality: N/A;   LAPAROTOMY  01/05/2011   Procedure: EXPLORATORY LAPAROTOMY;  Surgeon: Jamesetta So;  Location: [facility];  Service: General;  Laterality: N/A;   OPEN REDUCTION INTERNAL FIXATION (ORIF) SCAPHOID WITH DISTAL RADIUS GRAFT Right 02/01/2016   Procedure: Right distal radius open reduction and internal fixation and repair as indicated;  Surgeon: Iran Planas, MD;  Location: [place];  Service: Orthopedics;  Laterality: Right;  Requests 90 mins   Family History  Problem Relation Age of Onset   Depression Mother    Parkinson's disease Mother    Allergic rhinitis Brother    Angioedema Neg Hx    Asthma Neg Hx    Eczema Neg Hx    Immunodeficiency Neg Hx    Urticaria Neg Hx    Social History   Socioeconomic History   Marital status: Married    Spouse name: Jeneen Rinks   Number of children: 3   Years of education: Not on file   Highest education level: Not on file  Occupational History    Employer: [employer]   Occupation: CNA    Employer: [employer]  Tobacco Use   Smoking status: Never   Smokeless tobacco: Never  Vaping Use   Vaping Use: Never used  Substance and Sexual Activity   Alcohol use: No   Drug use: No   Sexual activity: Not Currently  Other Topics Concern   Not on file  Social  History Narrative   Married x 53 years.   2 children living. Youngest son died in [place].   2 granddaughters   Social Determinants of Radio broadcast assistant Strain: Low Risk    Difficulty of Paying Living Expenses: Not hard at all  Food Insecurity: No Food Insecurity   Worried About Charity fundraiser in the Last Year: Never true   Arboriculturist in the Last Year: Never true  Transportation Needs: No Transportation Needs   Lack of Transportation (Medical): No   Lack of Transportation (Non-Medical): No  Physical Activity: Inactive   Days of Exercise per Week: 0 days   Minutes of Exercise per Session: 0 min  Stress: No Stress Concern Present   Feeling of Stress : Not at all  Social Connections: Socially Integrated   Frequency of Communication with Friends and Family: More than  three times a week   Frequency of Social Gatherings with Friends and Family: More than three times a week   Attends Religious Services: More than 4 times per year   Active Member of Clubs or Organizations: Yes   Attends Music therapist: More than 4 times per year   Marital Status: Married    Tobacco Counseling Counseling given: Not Answered   Clinical Intake:  Pre-visit preparation completed: Yes  Pain : No/denies pain     BMI - recorded: 38.22 Nutritional Status: BMI > 30  Obese Nutritional Risks: None Diabetes: No  How often do you need to have someone help you when you read instructions, pamphlets, or other written materials from your doctor or pharmacy?: 1 - Never  Diabetic?No  Interpreter Needed?: No  Information entered by :: MJ Janne Faulk, LPN   Activities of Daily Living In your present state of health, do you have any difficulty performing the following activities: 02/05/2021 06/25/2020  Hearing? N N  Vision? N N  Difficulty concentrating or making decisions? N N  Walking or climbing stairs? N N  Dressing or bathing? N N  Doing errands, shopping? N N  Preparing  Food and eating ? N -  Using the Toilet? N -  In the past six months, have you accidently leaked urine? N -  Do you have problems with loss of bowel control? N -  Managing your Medications? N -  Managing your Finances? N -  Housekeeping or managing your Housekeeping? N -  Some recent data might be hidden    Patient Care Team: Chevis Pretty, FNP as PCP - General (Family Medicine) Derek Jack, MD as Consulting Physician (Oncology)  Indicate any recent Medical Services you may have received from other than [facility] providers in the past year (date may be approximate).     Assessment:   This is a routine wellness examination for [place].  Hearing/Vision screen Hearing Screening - Comments:: No hearing issues.  Vision Screening - Comments:: Glasses. [facility] 08/2020  Dietary issues and exercise activities discussed: Current Exercise Habits: The patient does not participate in regular exercise at present, Exercise limited by: cardiac condition(s);orthopedic condition(s)   Goals Addressed             This Visit's Progress    Exercise 150 min/wk Moderate Activity   Not on track    Have 3 meals a day   On track      Depression Screen PHQ 2/9 Scores 02/05/2021 11/11/2020 10/07/2020 06/25/2020 06/12/2020 05/31/2019 05/11/2019  PHQ - 2 Score 1 0 1 1 0 0 0  PHQ- 9 Score - 0 7 2 0 - -    Fall Risk Fall Risk  02/05/2021 11/11/2020 10/07/2020 06/25/2020 06/12/2020  Falls in the past year? 1 0 0 0 0  Number falls in past yr: 0 - - - -  Injury with Fall? 1 - - - -  Comment Knee fracture - - - -  Risk for fall due to : Impaired mobility;Impaired balance/gait;History of fall(s) - - - -  Follow up Falls prevention discussed - - - -    FALL RISK PREVENTION PERTAINING TO THE HOME:  Any stairs in or around the home? Yes  If so, are there any without handrails? No  Home free of loose throw rugs in walkways, pet beds, electrical cords, etc? Yes  Adequate lighting in your  home to reduce risk of falls? Yes   ASSISTIVE DEVICES UTILIZED TO PREVENT FALLS:  Life  alert? Yes  Use of a cane, walker or w/c? Yes  Grab bars in the bathroom? No  Shower chair or bench in shower? No  Elevated toilet seat or a handicapped toilet? Yes   TIMED UP AND GO:  Was the test performed? No .  Phone visit.  Cognitive Function: Normal cognitive status assessed by direct observation by this Nurse Health Advisor. No abnormalities found.   MMSE - Mini Mental State Exam 04/13/2017 11/19/2014  Orientation to time 5 5  Orientation to Place 5 5  Registration 3 3  Attention/ Calculation 5 5  Recall 3 3  Language- name 2 objects 2 2  Language- repeat 1 1  Language- follow 3 step command 3 3  Language- read & follow direction 1 1  Write a sentence 1 1  Copy design 1 1  Total score 30 30     6CIT Screen 05/31/2019  What Year? 0 points  What month? 0 points  What time? 0 points  Count back from 20 0 points  Months in reverse 0 points  Repeat phrase 0 points  Total Score 0    Immunizations Immunization History  Administered Date(s) Administered   Fluad Quad(high Dose 65+) 10/25/2019   Influenza, High Dose Seasonal PF 11/21/2018, 11/18/2020   Influenza-Unspecified 11/28/2019   Moderna Sars-Covid-2 Vaccination 02/13/2019, 03/13/2019, 12/05/2019   PFIZER Comirnaty(Gray Top)Covid-19 Tri-Sucrose Vaccine 11/28/2019    TDAP status: Due, Education has been provided regarding the importance of this vaccine. Advised may receive this vaccine at local pharmacy or Health Dept. Aware to provide a copy of the vaccination record if obtained from local pharmacy or Health Dept. Verbalized acceptance and understanding.  Flu Vaccine status: Up to date  Pneumococcal vaccine status: Due, Education has been provided regarding the importance of this vaccine. Advised may receive this vaccine at local pharmacy or Health Dept. Aware to provide a copy of the vaccination record if obtained from  local pharmacy or Health Dept. Verbalized acceptance and understanding.  Covid-19 vaccine status: Completed vaccines  Qualifies for Shingles Vaccine? Yes   Zostavax completed No   Shingrix Completed?: No.    Education has been provided regarding the importance of this vaccine. Patient has been advised to call insurance company to determine out of pocket expense if they have not yet received this vaccine. Advised may also receive vaccine at local pharmacy or Health Dept. Verbalized acceptance and understanding.  Screening Tests Health Maintenance  Topic Date Due   Pneumonia Vaccine 29+ Years old (1 - PCV) Never done   COVID-19 Vaccine (5 - Booster) 01/30/2020   Zoster Vaccines- Shingrix (1 of 2) 02/11/2021 (Originally 04/16/1969)   DEXA SCAN  11/11/2021 (Originally 04/15/2016)   TETANUS/TDAP  11/11/2021 (Originally 11/23/2019)   COLONOSCOPY (Pts 45-48yrs Insurance coverage will need to be confirmed)  02/24/2022   MAMMOGRAM  05/23/2022   INFLUENZA VACCINE  Completed   Hepatitis C Screening  Completed   HPV VACCINES  Aged Out    Health Maintenance  Health Maintenance Due  Topic Date Due   Pneumonia Vaccine 23+ Years old (1 - PCV) Never done   COVID-19 Vaccine (5 - Booster) 01/30/2020    Colorectal cancer screening: Type of screening: Colonoscopy. Completed 02/25/2012. Repeat every 10 years  Mammogram status: Completed 05/22/2020. Repeat every year  Bone Density status: Ordered Pt to call office when ready to schedule. Pt provided with contact info and advised to call to schedule appt.  Lung Cancer Screening: (Low Dose CT Chest recommended if Age  55-80 years, 30 pack-year currently smoking OR have quit w/in 15years.) does not qualify.    Additional Screening:  Hepatitis C Screening: does qualify; Completed 11/19/2014  Vision Screening: Recommended annual ophthalmology exams for early detection of glaucoma and other disorders of the eye. Is the patient up to date with their annual  eye exam?  Yes  Who is the provider or what is the name of the office in which the patient attends annual eye exams? [facility] If pt is not established with a provider, would they like to be referred to a provider to establish care? No .   Dental Screening: Recommended annual dental exams for proper oral hygiene  Community Resource Referral / Chronic Care Management: CRR required this visit?  No   CCM required this visit?  No      Plan:     I have personally reviewed and noted the following in the patients chart:   Medical and social history Use of alcohol, tobacco or illicit drugs  Current medications and supplements including opioid prescriptions.  Functional ability and status Nutritional status Physical activity Advanced directives List of other physicians Hospitalizations, surgeries, and ER visits in previous 12 months Vitals Screenings to include cognitive, depression, and falls Referrals and appointments  In addition, I have reviewed and discussed with patient certain preventive protocols, quality metrics, and best practice recommendations. A written personalized care plan for preventive services as well as general preventive health recommendations were provided to patient.     Chriss Driver, LPN   09/13/5907   Nurse Notes: Pt overdue for bone density. Pt states she will call in the next few months to schedule. Discussed Prevnar and Shingrix vaccines and how to obtain.

## 2021-02-20 ENCOUNTER — Ambulatory Visit: Payer: PPO | Admitting: Orthopaedic Surgery

## 2021-02-20 ENCOUNTER — Other Ambulatory Visit: Payer: Self-pay

## 2021-04-22 ENCOUNTER — Ambulatory Visit (INDEPENDENT_AMBULATORY_CARE_PROVIDER_SITE_OTHER): Payer: PPO | Admitting: Nurse Practitioner

## 2021-04-22 ENCOUNTER — Encounter: Payer: Self-pay | Admitting: Nurse Practitioner

## 2021-04-22 VITALS — BP 117/69 | HR 70 | Temp 97.2°F | Ht 67.0 in | Wt 247.8 lb

## 2021-04-22 DIAGNOSIS — R5383 Other fatigue: Secondary | ICD-10-CM

## 2021-04-22 NOTE — Progress Notes (Signed)
? ?Subjective:  ? ? Patient ID: [name], female    DOB: 09/24/1950, 71 y.o.   MRN: [id number] ? ? ?Chief Complaint: Fatigue (Has had something going on since Friday woke up in a sweat last night wondering if she needs to go back on an iron med again, went off of it a year ago) ? ? ?HPI ?Patient says she has had lots of fatigue. Says she wakes up feeling good but a couple of hours later she is so tired she can't do anything. She denies any chest pain, sob or headache. Has not seen cardiology in several years. She says she use to be on b12 injections 2x a month, but now she only gets 1x a month.  ?Lab Results  ?Component Value Date  ? [id number] 12/23/2020  ? ? ? ? ? ?Review of Systems  ?Constitutional:  Positive for fatigue. Negative for diaphoresis.  ?Eyes:  Negative for pain.  ?Respiratory:  Negative for shortness of breath.   ?Cardiovascular:  Negative for chest pain, palpitations and leg swelling.  ?Gastrointestinal:  Negative for abdominal pain.  ?Endocrine: Negative for polydipsia.  ?Skin:  Negative for rash.  ?Neurological:  Negative for dizziness, weakness and headaches.  ?Hematological:  Does not bruise/bleed easily.  ?All other systems reviewed and are negative. ? ?   ?Objective:  ? Physical Exam ?Vitals and nursing note reviewed.  ?Constitutional:   ?   General: She is not in acute distress. ?   Appearance: Normal appearance. She is well-developed.  ?Neck:  ?   Vascular: No carotid bruit or JVD.  ?Cardiovascular:  ?   Rate and Rhythm: Normal rate and regular rhythm.  ?   Heart sounds: Normal heart sounds.  ?Pulmonary:  ?   Effort: Pulmonary effort is normal. No respiratory distress.  ?   Breath sounds: Normal breath sounds. No wheezing or rales.  ?Chest:  ?   Chest wall: No tenderness.  ?Abdominal:  ?   General: Bowel sounds are normal. There is no distension or abdominal bruit.  ?   Palpations: Abdomen is soft. There is no hepatomegaly, splenomegaly, mass or pulsatile mass.  ?   Tenderness:  There is no abdominal tenderness.  ?Musculoskeletal:     ?   General: Normal range of motion.  ?   Cervical back: Normal range of motion and neck supple.  ?Lymphadenopathy:  ?   Cervical: No cervical adenopathy.  ?Skin: ?   General: Skin is warm and dry.  ?Neurological:  ?   Mental Status: She is alert and oriented to person, place, and time.  ?   Deep Tendon Reflexes: Reflexes are normal and symmetric.  ?Psychiatric:     ?   Behavior: Behavior normal.     ?   Thought Content: Thought content normal.     ?   Judgment: Judgment normal.  ? ?BP 117/69   Pulse 70   Temp (!) 97.2 ?F (36.2 ?C) (Oral)   Ht '5\' 7"'$  (1.702 m)   Wt 247 lb 12.8 oz (112.4 kg)   SpO2 96%   BMI 38.81 kg/m?  ? ? ? ? ?   ?Assessment & Plan:  ?[name] in today with chief complaint of Fatigue (Has had something going on since Friday woke up in a sweat last night wondering if she needs to go back on an iron med again, went off of it a year ago) ? ? ?1. Other fatigue ?Will talk after get lab results ?-  CBC with Differential/Platelet ?- Thyroid Panel With TSH ?- Vitamin B12 ? ? ? ?The above assessment and management plan was discussed with the patient. The patient verbalized understanding of and has agreed to the management plan. Patient is aware to call the clinic if symptoms persist or worsen. Patient is aware when to return to the clinic for a follow-up visit. Patient educated on when it is appropriate to go to the emergency department.  ? ?[name] Hassell Done, FNP ? ? ? ?

## 2021-04-22 NOTE — Patient Instructions (Signed)
Fatigue ?If you have fatigue, you feel tired all the time and have a lack of energy or a lack of motivation. Fatigue may make it difficult to start or complete tasks because of exhaustion. In general, occasional or mild fatigue is often a normal response to activity or life. However, long-lasting (chronic) or extreme fatigue may be a symptom of a medical condition. ?Follow these instructions at home: ?General instructions ?Watch your fatigue for any changes. ?Go to bed and get up at the same time every day. ?Avoid fatigue by pacing yourself during the day and getting enough sleep at night. ?Maintain a healthy weight. ?Medicines ?Take over-the-counter and prescription medicines only as told by your health care provider. ?Take a multivitamin, if told by your health care provider.  ?Do not use herbal or dietary supplements unless they are approved by your health care provider. ?Activity ? ?Exercise regularly, as told by your health care provider. ?Use or practice techniques to help you relax, such as yoga, tai chi, meditation, or massage therapy. ?Eating and drinking ? ?Avoid heavy meals in the evening. ?Eat a well-balanced diet, which includes lean proteins, whole grains, plenty of fruits and vegetables, and low-fat dairy products. ?Avoid consuming too much caffeine. ?Avoid the use of alcohol. ?Drink enough fluid to keep your urine pale yellow. ?Lifestyle ?Change situations that cause you stress. Try to keep your work and personal schedule in balance. ?Do not use any products that contain nicotine or tobacco, such as cigarettes and e-cigarettes. If you need help quitting, ask your health care provider. ?Do not use drugs. ?Contact a health care provider if: ?Your fatigue does not get better. ?You have a fever. ?You suddenly lose or gain weight. ?You have headaches. ?You have trouble falling asleep or sleeping through the night. ?You feel angry, guilty, anxious, or sad. ?You are unable to have a bowel movement  (constipation). ?Your skin is dry. ?You have swelling in your legs or another part of your body. ?Get help right away if: ?You feel confused. ?Your vision is blurry. ?You feel faint or you pass out. ?You have a severe headache. ?You have severe pain in your abdomen, your back, or the area between your waist and hips (pelvis). ?You have chest pain, shortness of breath, or an irregular or fast heartbeat. ?You are unable to urinate, or you urinate less than normal. ?You have abnormal bleeding, such as bleeding from the rectum, vagina, nose, lungs, or nipples. ?You vomit blood. ?You have thoughts about hurting yourself or others. ?If you ever feel like you may hurt yourself or others, or have thoughts about taking your own life, get help right away. You can go to your nearest emergency department or call: ?Your local emergency services (911 in the [place] ?A suicide crisis helpline, such as the National Suicide Prevention Lifeline at 1-800-273-8255 or 988 in the [place] This is open 24 hours a day. ?Summary ?If you have fatigue, you feel tired all the time and have a lack of energy or a lack of motivation. ?Fatigue may make it difficult to start or complete tasks because of exhaustion. ?Long-lasting (chronic) or extreme fatigue may be a symptom of a medical condition. ?Exercise regularly, as told by your health care provider. ?Change situations that cause you stress. Try to keep your work and personal schedule in balance. ?This information is not intended to replace advice given to you by your health care provider. Make sure you discuss any questions you have with your health care provider. ?Document Revised:   08/07/2020 Document Reviewed: 11/23/2019 ?Elsevier Patient Education ? 2022 Elsevier Inc. ? ?

## 2021-04-23 LAB — CBC WITH DIFFERENTIAL/PLATELET
Basophils Absolute: 0 10*3/uL (ref 0.0–0.2)
Basos: 0 %
EOS (ABSOLUTE): 0.1 10*3/uL (ref 0.0–0.4)
Eos: 1 %
Hematocrit: 41.7 % (ref 34.0–46.6)
Hemoglobin: 13.8 g/dL (ref 11.1–15.9)
Immature Grans (Abs): 0 10*3/uL (ref 0.0–0.1)
Immature Granulocytes: 0 %
Lymphocytes Absolute: 2.6 10*3/uL (ref 0.7–3.1)
Lymphs: 29 %
MCH: 29.7 pg (ref 26.6–33.0)
MCHC: 33.1 g/dL (ref 31.5–35.7)
MCV: 90 fL (ref 79–97)
Monocytes Absolute: 1 10*3/uL — ABNORMAL HIGH (ref 0.1–0.9)
Monocytes: 11 %
Neutrophils Absolute: 5.3 10*3/uL (ref 1.4–7.0)
Neutrophils: 59 %
Platelets: 309 10*3/uL (ref 150–450)
RBC: 4.64 x10E6/uL (ref 3.77–5.28)
RDW: 13 % (ref 11.7–15.4)
WBC: 9.1 10*3/uL (ref 3.4–10.8)

## 2021-04-23 LAB — THYROID PANEL WITH TSH
Free Thyroxine Index: 2.5 (ref 1.2–4.9)
T3 Uptake Ratio: 27 % (ref 24–39)
T4, Total: 9.3 ug/dL (ref 4.5–12.0)
TSH: 1.12 u[IU]/mL (ref 0.450–4.500)

## 2021-04-23 LAB — VITAMIN B12: Vitamin B-12: 656 pg/mL (ref 232–1245)

## 2021-05-01 NOTE — Progress Notes (Signed)
?  ?Cardiology Office Note ? ? ?Date:  05/07/2021  ? ?ID:  [name], DOB [birth year], MRN [id number] ? ?PCP:  Denise Pretty, FNP  ?Cardiologist:   Denise Jaquith Martinique, MD  ? ?Chief Complaint  ?Patient presents with  ? Palpitations  ? Shortness of Breath  ? ? ?  ?History of Present Illness: ?[name] is a 71 y.o. female who is seen at the request of Deville FNP for evaluation of dyspnea.  I also care for [name] She has a history of factor V Leiden deficiency with DVT on chronic anticoagulation. She has a history of paroxysmal ventricular tachycardia following chemotherapy for a GIST tummor - previously seen by EP- Dr Lovena Le. Treated initially with metoprolol and Flecainide. When last seen in 2016 was no longer on Flecainide. Echo in 2013 showed EF 50-55% with mid anterior HK.  ? ?She has done well without recurrent arrhythmia since then. She does complain of dyspnea but states this hasn't changed much recently. She notes fatigue particularly at this time of year. Weight is stable. No edema. No chest pain.  ? ? ? ?Past Medical History:  ?Diagnosis Date  ? Allergic urticaria 01/04/2011  ? Rash from tape.  ? Anxiety   ? Cellulitis of left leg 2006  ? Clotting disorder ([place])   ? heterozygosity from factor v leiden  ? DVT (deep venous thrombosis) ([place]) 07/10/2010  ? on coumadin  ? Dyspnea   ? with exertion  ? Factor V Leiden ([place])   ? GERD (gastroesophageal reflux disease)   ? 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 S/P resection on 01/05/11.  Intolerant to [employer]. Skin Cancer Left hand  ? History of blood transfusion   ? Hypertension   ? Pernicious anemia 07/10/2010  ? Small bowel mass 01/03/2011  ? s/p surgery  ? Ulcer 05/2009  ? esophageal  ? Ventricular tachycardia ([place]) 03/26/11  ? Vitamin B12 deficiency   ? vit b12 1000 mcg monthly  ? ? ?Past Surgical History:  ?Procedure Laterality Date  ? ABDOMINAL HYSTERECTOMY  1989  ? BALLOON DILATION  12/11/2010  ?  Procedure: BALLOON DILATION;  Surgeon: Rogene Houston, MD;  [facility];  Service: Endoscopy;  Laterality: N/A;  ? BOWEL RESECTION  01/05/2011  ? Procedure: SMALL BOWEL RESECTION;  Surgeon: Jamesetta So;  Location: [facility];  Service: General;;  Partial Small Bowel Resection  ? COLONOSCOPY  02/25/2012  ? Procedure: COLONOSCOPY;  Surgeon: Rogene Houston, MD;  Location[facility];  Service: Endoscopy;  Laterality: N/A;  1200  ? GIVENS CAPSULE STUDY  01/02/2011  ? Procedure: GIVENS CAPSULE STUDY;  Surgeon: Rogene Houston, MD;  [facility]: [facility];  Service: Endoscopy;  Laterality: N/A;  ? LAPAROTOMY  01/05/2011  ? Procedure: EXPLORATORY LAPAROTOMY;  Surgeon: Jamesetta So;  Location: [facility];  Service: General;  Laterality: N/A;  ? OPEN REDUCTION INTERNAL FIXATION (ORIF) SCAPHOID WITH DISTAL RADIUS GRAFT Right 02/01/2016  ? Procedure: Right distal radius open reduction and internal fixation and repair as indicated;  Surgeon: Iran Planas, MD;  Location: [facility];  Service: Orthopedics;  Laterality: Right;  Requests 90 mins  ? ? ? ?Current Outpatient Medications  ?Medication Sig Dispense Refill  ? acetaminophen (TYLENOL) 500 MG tablet Take 500-1,000 mg by mouth every 6 (six) hours as needed for moderate pain.     ? cyanocobalamin (,VITAMIN B-12,) 1000 MCG/ML injection INJECT 1ML IM EVERY 30 DAYS 1 mL  11  ? diphenhydrAMINE (BENADRYL) 25 mg capsule Take 25 mg by mouth every 6 (six) hours as needed for allergies (bee stings).     ? losartan-hydrochlorothiazide (HYZAAR) 100-25 MG tablet Take 1 tablet by mouth daily. (Patient taking differently: Take 1 tablet by mouth 2 (two) times daily.) 90 tablet 1  ? metoprolol tartrate (LOPRESSOR) 25 MG tablet Take 0.5 tablets (12.5 mg total) by mouth 2 (two) times daily. (Needs to be seen before next refill) 180 tablet 1  ? omeprazole (PRILOSEC) 20 MG capsule Take 1 capsule (20 mg total) by mouth daily. (Needs to be seen before next refill) 90 capsule 1  ? XARELTO 20 MG  TABS tablet Take 1 tablet (20 mg total) by mouth daily with supper. 30 tablet 11  ? ?No current facility-administered medications for this visit.  ? ? ?Allergies:   Bee venom, Cephalexin, Dexlansoprazole, Imatinib, Other, Penicillins, Ace inhibitors, Doxycycline hyclate, Latex, Nylon, Sulfa antibiotics, Sulfonamide derivatives, and Tape  ? ? ?Social History:  The patient  reports that she has never smoked. She has never used smokeless tobacco. She reports that she does not drink alcohol and does not use drugs.  ? ?Family History:  The patient's family history includes Allergic rhinitis in Denise brother; Depression in Denise mother; Parkinson's disease in Denise mother.  ? ? ?ROS:  Please see the history of present illness.   Otherwise, review of systems are positive for none.   All other systems are reviewed and negative.  ? ? ?PHYSICAL EXAM: ?VS:  BP 120/70 (BP Location: Left Arm)   Pulse (!) 54   Ht 5' 7.5" (1.715 m)   Wt 246 lb 6.4 oz (111.8 kg)   SpO2 94%   BMI 38.02 kg/m?  , BMI Body mass index is 38.02 kg/m?. ?GEN: Well nourished, overweight, in no acute distress ?HEENT: normal ?Neck: no JVD, carotid bruits, or masses ?Cardiac: RRR; no murmurs, rubs, or gallops,no edema  ?Respiratory:  clear to auscultation bilaterally, normal work of breathing ?GI: soft, nontender, nondistended, + BS ?MS: no deformity or atrophy ?Skin: warm and dry, no rash ?Neuro:  Strength and sensation are intact ?Psych: euthymic mood, full affect ? ? ?EKG:  EKG is ordered today. ?The ekg ordered today demonstrates NSR with possible old inferior and anterior infarct. Suspect related to body habitus. Rate 54. I have personally reviewed and interpreted this study. ? ? ? ?Recent Labs: ?12/23/2020: ALT 25; BUN 14; Creatinine, Ser 0.78; Potassium 3.4; Sodium 138 ?04/22/2021: Hemoglobin 13.8; Platelets 309; TSH 1.120  ? ? ?Lipid Panel ?   ?Component Value Date/Time  ? CHOL 198 06/12/2020 1653  ? TRIG 153 (H) 06/12/2020 1653  ? HDL 47 06/12/2020 1653   ? CHOLHDL 4.2 06/12/2020 1653  ? [place] 124 (H) 06/12/2020 1653  ? ?  ? ?Wt Readings from Last 3 Encounters:  ?05/07/21 246 lb 6.4 oz (111.8 kg)  ?04/22/21 247 lb 12.8 oz (112.4 kg)  ?02/05/21 244 lb (110.7 kg)  ?  ? ? ?Other studies Reviewed: ?Additional studies/ records that were reviewed today include:  ? ?Echo 03/26/11: Study Conclusions  ? ?- Left ventricle: The cavity size was normal. There was mild  ?  focal basal hypertrophy of the septum. Systolic function  ?  was low normal. The estimated ejection fraction was in the  ?  range of 50% to 55%. Hypokinesis of the midanteroseptal  ?  myocardium.  ?- Aortic valve: Mildly calcified annulus. Trileaflet.  ?- Mitral valve: Calcified annulus.  ?-  Right ventricle: The cavity size was mildly dilated. Wall  ?  thickness was at the upper limits of normal.  ?- Right atrium: The atrium was mildly dilated.  ?- Atrial septum: No defect or patent foramen ovale was  ?  identified.  ? ?ASSESSMENT AND PLAN: ? ?1.  Dyspnea. Some fatigue. Recommend updating Echo. If LV function is good then no further cardiac work up needed. I did review chest CT from 2019 and she had no coronary calcification. Reports she attempted a stress test years ago but wasn't able to complete due to dyspnea and leg problems.  ?2. History of DVT with clotting disorder. On Xarelto ?3. HTN controlled. ?4. History of GIST tumor. No recurrence ?5. History of paroxysmal VT. No recurrence for many years on metoprolol.  ? ? ?Current medicines are reviewed at length with the patient today.  The patient does not have concerns regarding medicines. ? ?The following changes have been made:  no change ? ?Labs/ tests ordered today include:  ? ?Orders Placed This Encounter  ?Procedures  ? EKG 12-Lead  ? ECHOCARDIOGRAM COMPLETE  ? ? ?   ? ? ?Disposition:   FU depending on results of Echo ? ?Signed, ?Dhara Schepp Martinique, MD  ?05/07/2021 8:51 AM    ?[facility] ?[address] [place], [place],  [address] ?Phone [phone], Fax [phone] ? ? ?

## 2021-05-07 ENCOUNTER — Ambulatory Visit: Payer: PPO | Admitting: Cardiology

## 2021-05-07 ENCOUNTER — Encounter: Payer: Self-pay | Admitting: Cardiology

## 2021-05-07 VITALS — BP 120/70 | HR 54 | Ht 67.5 in | Wt 246.4 lb

## 2021-05-07 DIAGNOSIS — R0600 Dyspnea, unspecified: Secondary | ICD-10-CM | POA: Diagnosis not present

## 2021-05-07 DIAGNOSIS — I4729 Other ventricular tachycardia: Secondary | ICD-10-CM | POA: Diagnosis not present

## 2021-05-07 DIAGNOSIS — I472 Ventricular tachycardia, unspecified: Secondary | ICD-10-CM

## 2021-05-07 NOTE — Patient Instructions (Signed)
Medication Instructions:  ?Continue same medications ? ? ?Lab Work: ?None ordered ? ? ?Testing/Procedures: ?Echo ? ? ?Follow-Up: ?At [facility], you and your health needs are our priority.  As part of our continuing mission to provide you with exceptional heart care, we have created designated Provider Care Teams.  These Care Teams include your primary Cardiologist (physician) and Advanced Practice Providers (APPs -  Physician Assistants and Nurse Practitioners) who all work together to provide you with the care you need, when you need it. ? ?We recommend signing up for the patient portal called "MyChart".  Sign up information is provided on this After Visit Summary.  MyChart is used to connect with patients for Virtual Visits (Telemedicine).  Patients are able to view lab/test results, encounter notes, upcoming appointments, etc.  Non-urgent messages can be sent to your provider as well.   ?To learn more about what you can do with MyChart, go to [email or web address].   ? ?Your next appointment:  To Be Determined after test ?  ? ?The format for your next appointment: Office  ? ? ?Provider:  Dr.Jordan ? ? ?Important Information About Sugar ? ? ? ? ? ? ?

## 2021-05-12 ENCOUNTER — Ambulatory Visit: Payer: PPO | Admitting: Nurse Practitioner

## 2021-05-26 ENCOUNTER — Ambulatory Visit (HOSPITAL_COMMUNITY): Payer: PPO | Attending: Internal Medicine

## 2021-05-26 DIAGNOSIS — R0600 Dyspnea, unspecified: Secondary | ICD-10-CM

## 2021-05-26 DIAGNOSIS — I4729 Other ventricular tachycardia: Secondary | ICD-10-CM | POA: Insufficient documentation

## 2021-05-26 LAB — ECHOCARDIOGRAM COMPLETE
Area-P 1/2: 2.93 cm2
S' Lateral: 3 cm

## 2021-06-13 ENCOUNTER — Other Ambulatory Visit: Payer: Self-pay | Admitting: Nurse Practitioner

## 2021-06-13 DIAGNOSIS — I10 Essential (primary) hypertension: Secondary | ICD-10-CM

## 2021-06-13 DIAGNOSIS — K219 Gastro-esophageal reflux disease without esophagitis: Secondary | ICD-10-CM

## 2021-06-25 ENCOUNTER — Other Ambulatory Visit (HOSPITAL_COMMUNITY): Payer: Self-pay | Admitting: Hematology

## 2021-06-25 DIAGNOSIS — Z1231 Encounter for screening mammogram for malignant neoplasm of breast: Secondary | ICD-10-CM

## 2021-07-14 ENCOUNTER — Ambulatory Visit (HOSPITAL_COMMUNITY)
Admission: RE | Admit: 2021-07-14 | Discharge: 2021-07-14 | Disposition: A | Discharge status: Home / Self Care | Payer: PPO | Source: Ambulatory Visit | Attending: Hematology | Admitting: Hematology

## 2021-07-14 DIAGNOSIS — Z1231 Encounter for screening mammogram for malignant neoplasm of breast: Secondary | ICD-10-CM | POA: Diagnosis not present

## 2021-09-03 ENCOUNTER — Other Ambulatory Visit: Payer: Self-pay | Admitting: Nurse Practitioner

## 2021-09-03 ENCOUNTER — Other Ambulatory Visit: Payer: Self-pay | Admitting: Hematology

## 2021-09-03 DIAGNOSIS — K219 Gastro-esophageal reflux disease without esophagitis: Secondary | ICD-10-CM

## 2021-09-03 DIAGNOSIS — D510 Vitamin B12 deficiency anemia due to intrinsic factor deficiency: Secondary | ICD-10-CM

## 2021-09-03 DIAGNOSIS — I10 Essential (primary) hypertension: Secondary | ICD-10-CM

## 2021-09-26 DIAGNOSIS — H04123 Dry eye syndrome of bilateral lacrimal glands: Secondary | ICD-10-CM | POA: Diagnosis not present

## 2021-09-26 DIAGNOSIS — H40033 Anatomical narrow angle, bilateral: Secondary | ICD-10-CM | POA: Diagnosis not present

## 2021-10-02 ENCOUNTER — Encounter: Payer: Self-pay | Admitting: Nurse Practitioner

## 2021-10-02 ENCOUNTER — Ambulatory Visit (INDEPENDENT_AMBULATORY_CARE_PROVIDER_SITE_OTHER): Payer: PPO | Admitting: Nurse Practitioner

## 2021-10-02 VITALS — BP 124/63 | HR 55 | Temp 97.6°F | Resp 20 | Ht 67.0 in | Wt 248.0 lb

## 2021-10-02 DIAGNOSIS — I471 Supraventricular tachycardia, unspecified: Secondary | ICD-10-CM

## 2021-10-02 DIAGNOSIS — D510 Vitamin B12 deficiency anemia due to intrinsic factor deficiency: Secondary | ICD-10-CM

## 2021-10-02 DIAGNOSIS — K219 Gastro-esophageal reflux disease without esophagitis: Secondary | ICD-10-CM

## 2021-10-02 DIAGNOSIS — F3341 Major depressive disorder, recurrent, in partial remission: Secondary | ICD-10-CM | POA: Diagnosis not present

## 2021-10-02 DIAGNOSIS — I10 Essential (primary) hypertension: Secondary | ICD-10-CM

## 2021-10-02 DIAGNOSIS — I82522 Chronic embolism and thrombosis of left iliac vein: Secondary | ICD-10-CM

## 2021-10-02 DIAGNOSIS — E7800 Pure hypercholesterolemia, unspecified: Secondary | ICD-10-CM

## 2021-10-02 DIAGNOSIS — D508 Other iron deficiency anemias: Secondary | ICD-10-CM | POA: Diagnosis not present

## 2021-10-02 MED ORDER — METOPROLOL TARTRATE 25 MG PO TABS: 12.5000 mg | ORAL_TABLET | Freq: Two times a day (BID) | ORAL | 1 refills | Status: DC

## 2021-10-02 MED ORDER — XARELTO 20 MG PO TABS: 20.0000 mg | ORAL_TABLET | Freq: Every day | ORAL | 11 refills | Status: DC

## 2021-10-02 MED ORDER — OMEPRAZOLE 20 MG PO CPDR: 20.0000 mg | DELAYED_RELEASE_CAPSULE | Freq: Every day | ORAL | 1 refills | Status: DC

## 2021-10-02 MED ORDER — LOSARTAN POTASSIUM-HCTZ 100-25 MG PO TABS: 1.0000 | ORAL_TABLET | Freq: Every day | ORAL | 1 refills | Status: DC

## 2021-10-02 NOTE — Patient Instructions (Signed)
Supraventricular Tachycardia, Adult Supraventricular tachycardia (SVT) is a kind of abnormal heartbeat. It makes your heart beat very fast. This may last for a short time and then return to normal, or it may last longer. A normal resting heartbeat is 60-100 times a minute. This condition can make your heart beat more than 150 times a minute. Times of having a fast heartbeat (episodes) can be scary, but they are usually not dangerous. In some cases, they may lead to heart failure if they: Happen many times a day. Last longer than a few seconds. What are the causes?  This condition happens when electrical signals are sent out from areas of the heart that do not normally send signals for the heartbeat. What increases the risk? You are more likely to develop this condition if you are: Middle aged or younger. Female. The following factors may also make you more likely to develop this condition: Stress. Feeling worried or nervous (anxiety). Tiredness. Smoking. Stimulant drugs, such as cocaine and methamphetamine. Alcohol. Caffeine. Pregnancy. Having certain medical conditions. What are the signs or symptoms? A pounding heart. A feeling that your heart is skipping beats (palpitations). Weakness. Trouble getting enough air. Pain or tightness in your chest. Dizziness or feeling like you are going to pass out (faint). Feeling worried or nervous. Sweating. Feeling like you may vomit (nausea). Passing out. Tiredness. Sometimes, there are no symptoms. How is this treated? Treatment may include: Vagal nerve stimulation. Ways to do this include: Holding your breath and pushing, as though you are pooping (having a bowel movement). Massaging an area on one side of your neck. Do not try this yourself. Only a doctor should do this. If done the wrong way, it can lead to a stroke. Bending forward with your head between your legs. Coughing while bending forward with your head between your  legs. Putting an ice-cold, wet towel on your face. Medicines that prevent attacks. Medicine to stop an attack given through an IV tube at the hospital. A small electric shock (cardioversion) that stops an attack. A procedure to get rid of cells in the area that is causing the fast heartbeats (radiofrequency ablation). If you do not have symptoms, you may not need treatment. Follow these instructions at home: Stress Avoid things that make you feel stressed. To deal with stress, try: Doing yoga or meditation. Being out in nature. Listening to relaxing music. Doing deep breathing. Taking steps to be healthy, such as getting lots of sleep, exercising, and eating a balanced diet. Talking with a mental health doctor. Lifestyle  Try to get at least 7 hours of sleep each night. Do not smoke or use any products that contain nicotine or tobacco. If you need help quitting, ask your doctor. Do not drink alcohol if it gives you a fast heartbeat. If alcohol does not seem to give you a fast heartbeat, limit your alcohol use. If you drink alcohol: Limit how much you have to: 0-1 drink a day for women who are not pregnant. 0-2 drinks a day for men. Know how much alcohol is in your drink. In the [place] one drink equals one 12 oz bottle of beer (355 mL), one 5 oz glass of wine (148 mL), or one 1 oz glass of hard liquor (44 mL). Be aware of how caffeine affects you. If caffeine gives you a fast heartbeat, do not eat, drink, or use anything with caffeine in it. If caffeine does not seem to give you a fast heartbeat, limit how much  caffeine you eat, drink, or use. Do not use stimulant drugs. If you need help quitting, ask your doctor. General instructions Stay at a healthy weight. Exercise regularly. Ask your doctor about good activities for you. Try one or a mixture of these: 150 minutes a week of gentle exercise, like walking or yoga. 75 minutes a week of exercise that is very active, like running or  swimming. Do vagus nerve treatments to slow down your heartbeat as told by your doctor. Take over-the-counter and prescription medicines only as told by your doctor. Keep all follow-up visits. Contact a doctor if: You have a fast heartbeat more often. Times of having a fast heartbeat last longer than before. Home treatments to slow down your heartbeat do not help. You have new symptoms. Get help right away if: You have chest pain. Your symptoms get worse. You have trouble breathing. Your heart beats very fast for more than 20 minutes. You pass out. These symptoms may be an emergency. Get medical help right away. Call your local emergency services (911 in the [place] Do not wait to see if the symptoms will go away. Do not drive yourself to the hospital. Summary SVT is a type of abnormal heartbeat. This condition can make your heart beat more than 150 times a minute. If you do not have symptoms, you may not need treatment. This information is not intended to replace advice given to you by your health care provider. Make sure you discuss any questions you have with your health care provider. Document Revised: 08/26/2019 Document Reviewed: 08/26/2019 Elsevier Patient Education  [place].

## 2021-10-02 NOTE — Progress Notes (Signed)
Subjective:    Patient ID: [name], female    DOB: 1950-05-29, 71 y.o.   MRN: [id number]   Chief Complaint: medical management of chronic issues     HPI:  [name] is a 71 y.o. who identifies as a female who was assigned female at birth.   Social history: Lives with: husband Work history: retired from [employer] in today for follow up of the following chronic medical issues:  1. Primary hypertension No c/o chest pain, sob or headache. Does not check bloodpressure at home. BP Readings from Last 3 Encounters:  10/02/21 124/63  05/07/21 120/70  04/22/21 117/69     2. SVT (supraventricular tachycardia) ([place]) Has had no recent episodes of heart racing. Saw cardiology on 05/07/21 and no changes were made to plan of care.  3. Chronic deep vein thrombosis (DVT) of iliac vein of left lower extremity ([name]) Is on xeralto with nom bleeding issues  4. Gastroesophageal reflux disease without esophagitis Is on omeprazole daily and is doing well.  5. Recurrent major depressive disorder, in partial remission ([place]) Is currently on no antidepressant    10/02/2021    8:25 AM 04/22/2021    3:15 PM 02/05/2021    9:11 AM  Depression screen PHQ 2/9  Decreased Interest 0 0 0  Down, Depressed, Hopeless 0 0 1  PHQ - 2 Score 0 0 1  Altered sleeping 0 0   Tired, decreased energy 0 0   Change in appetite 0 0   Feeling bad or failure about yourself  0 0   Trouble concentrating 0 0   Moving slowly or fidgety/restless 0 0   Suicidal thoughts 0 0   PHQ-9 Score 0 0   Difficult doing work/chores Not difficult at all       6. Other iron deficiency anemia Does c/o fatigue Lab Results  Component Value Date   HGB 13.8 04/22/2021     7. Pernicious anemia Lab Results  Component Value Date   VITAMINB12 656 04/22/2021     8. Morbid obesity ([place]) No recent weight changes Wt Readings from Last 3 Encounters:  10/02/21 248 lb (112.5 kg)  05/07/21 246 lb 6.4 oz  (111.8 kg)  04/22/21 247 lb 12.8 oz (112.4 kg)   BMI Readings from Last 3 Encounters:  10/02/21 38.84 kg/m  05/07/21 38.02 kg/m  04/22/21 38.81 kg/m      New complaints: None today  Allergies  Allergen Reactions   Bee Venom Swelling and Rash    SWELLING REACTION UNSPECIFIED  SWELLING REACTION UNSPECIFIED    Cephalexin Swelling    PATIENT WITH Rx OF ANAPHYLAXIS TO PCN's SWELLING REACTION UNSPECIFIED  PATIENT WITH Rx OF ANAPHYLAXIS TO PCN's SWELLING REACTION UNSPECIFIED    Dexlansoprazole Swelling    SWELLING REACTION UNSPECIFIED  SWELLING REACTION UNSPECIFIED    Imatinib Other (See Comments)    Cardiac dysrhythmia   Other Swelling    PECANS MOUTH SWELLS   Penicillins Anaphylaxis    Has patient had a PCN reaction causing immediate rash, facial/tongue/throat swelling, SOB or lightheadedness with hypotension: No no Has patient had a PCN reaction causing severe rash involving mucus membranes or skin necrosis: No Has patient had a PCN reaction that required hospitalization No Has patient had a PCN reaction occurring within the last 10 years: No If all of the above answers are "NO", then may proceed with Cephalosporin use.    Ace Inhibitors Other (See Comments)    angioedema  Doxycycline Hyclate    Latex Itching   Nylon Rash   Sulfa Antibiotics Rash   Sulfonamide Derivatives Rash   Tape Itching    Paper tape is ok Paper tape is ok   Outpatient Encounter Medications as of 10/02/2021  Medication Sig   acetaminophen (TYLENOL) 500 MG tablet Take 500-1,000 mg by mouth every 6 (six) hours as needed for moderate pain.    cyanocobalamin (VITAMIN B12) 1000 MCG/ML injection INJECT 1ML IM EVERY 30 DAYS   diphenhydrAMINE (BENADRYL) 25 mg capsule Take 25 mg by mouth every 6 (six) hours as needed for allergies (bee stings).    losartan-hydrochlorothiazide (HYZAAR) 100-25 MG tablet TAKE ONE (1) TABLET BY MOUTH EVERY DAY   metoprolol tartrate (LOPRESSOR) 25 MG tablet Take 0.5  tablets (12.5 mg total) by mouth 2 (two) times daily. (Needs to be seen before next refill)   omeprazole (PRILOSEC) 20 MG capsule TAKE ONE CAPSULE BY MOUTH DAILY   XARELTO 20 MG TABS tablet Take 1 tablet (20 mg total) by mouth daily with supper.   No facility-administered encounter medications on file as of 10/02/2021.    Past Surgical History:  Procedure Laterality Date   ABDOMINAL HYSTERECTOMY  1989   BALLOON DILATION  12/11/2010   Procedure: BALLOON DILATION;  Surgeon: Rogene Houston, MD;  [facility];  Service: Endoscopy;  Laterality: N/A;   BOWEL RESECTION  01/05/2011   Procedure: SMALL BOWEL RESECTION;  Surgeon: Jamesetta So;  Location: [facility];  Service: General;;  Partial Small Bowel Resection   COLONOSCOPY  02/25/2012   Procedure: COLONOSCOPY;  Surgeon: Rogene Houston, MD;  [facility];  Service: Endoscopy;  Laterality: N/A;  [place]  01/02/2011   Procedure: GIVENS CAPSULE STUDY;  Surgeon: Rogene Houston, MD;  [facility];  Service: Endoscopy;  Laterality: N/A;   LAPAROTOMY  01/05/2011   Procedure: EXPLORATORY LAPAROTOMY;  Surgeon: Jamesetta So;  Location: [facility];  Service: General;  Laterality: N/A;   OPEN REDUCTION INTERNAL FIXATION (ORIF) SCAPHOID WITH DISTAL RADIUS GRAFT Right 02/01/2016   Procedure: Right distal radius open reduction and internal fixation and repair as indicated;  Surgeon: Iran Planas, MD;  Location: [facility];  Service: Orthopedics;  Laterality: Right;  Requests 90 mins    Family History  Problem Relation Age of Onset   Depression Mother    Parkinson's disease Mother    Allergic rhinitis Brother    Angioedema Neg Hx    Asthma Neg Hx    Eczema Neg Hx    Immunodeficiency Neg Hx    Urticaria Neg Hx       Controlled substance contract: n/a     Review of Systems  Constitutional:  Negative for diaphoresis.  Eyes:  Negative for pain.  Respiratory:  Negative for shortness of breath.    Cardiovascular:  Negative for chest pain, palpitations and leg swelling.  Gastrointestinal:  Negative for abdominal pain.  Endocrine: Negative for polydipsia.  Skin:  Negative for rash.  Neurological:  Negative for dizziness, weakness and headaches.  Hematological:  Does not bruise/bleed easily.  All other systems reviewed and are negative.      Objective:   Physical Exam Vitals and nursing note reviewed.  Constitutional:      General: She is not in acute distress.    Appearance: Normal appearance. She is well-developed.  HENT:     Head: Normocephalic.     Right Ear: Tympanic membrane normal.  Left Ear: Tympanic membrane normal.     Nose: Nose normal.     Mouth/Throat:     Mouth: Mucous membranes are moist.  Eyes:     Pupils: Pupils are equal, round, and reactive to light.  Neck:     Vascular: No carotid bruit or JVD.  Cardiovascular:     Rate and Rhythm: Normal rate and regular rhythm.     Heart sounds: Normal heart sounds.  Pulmonary:     Effort: Pulmonary effort is normal. No respiratory distress.     Breath sounds: Normal breath sounds. No wheezing or rales.  Chest:     Chest wall: No tenderness.  Abdominal:     General: Bowel sounds are normal. There is no distension or abdominal bruit.     Palpations: Abdomen is soft. There is no hepatomegaly, splenomegaly, mass or pulsatile mass.     Tenderness: There is no abdominal tenderness.  Musculoskeletal:        General: Normal range of motion.     Cervical back: Normal range of motion and neck supple.  Lymphadenopathy:     Cervical: No cervical adenopathy.  Skin:    General: Skin is warm and dry.  Neurological:     Mental Status: She is alert and oriented to person, place, and time.     Deep Tendon Reflexes: Reflexes are normal and symmetric.  Psychiatric:        Behavior: Behavior normal.        Thought Content: Thought content normal.        Judgment: Judgment normal.     BP 124/63   Pulse (!) 55   Temp  97.6 F (36.4 C) (Temporal)   Resp 20   Ht '5\' 7"'  (1.702 m)   Wt 248 lb (112.5 kg)   SpO2 97%   BMI 38.84 kg/m        Assessment & Plan:   [name] comes in today with chief complaint of Medical Management of Chronic Issues   Diagnosis and orders addressed:  1. Primary hypertension Low sodium diet - losartan-hydrochlorothiazide (HYZAAR) 100-25 MG tablet; Take 1 tablet by mouth daily.  Dispense: 90 tablet; Refill: 1 - CBC with Differential/Platelet - CMP14+EGFR - Lipid panel  2. SVT (supraventricular tachycardia) ([name]) Avoid caffeine - XARELTO 20 MG TABS tablet; Take 1 tablet (20 mg total) by mouth daily with supper.  Dispense: 30 tablet; Refill: 11 - metoprolol tartrate (LOPRESSOR) 25 MG tablet; Take 0.5 tablets (12.5 mg total) by mouth 2 (two) times daily. (Needs to be seen before next refill)  Dispense: 180 tablet; Refill: 1  3. Chronic deep vein thrombosis (DVT) of iliac vein of left lower extremity ([name]) Report any leg swelling  4. Gastroesophageal reflux disease without esophagitis Avoid spicy foods Do not eat 2 hours prior to bedtime - omeprazole (PRILOSEC) 20 MG capsule; Take 1 capsule (20 mg total) by mouth daily.  Dispense: 90 capsule; Refill: 1  5. Recurrent major depressive disorder, in partial remission ([place]) Stress management  6. Other iron deficiency anemia Labs pending  7. Pernicious anemia Continue monthly b12 injections  8. Morbid obesity ([place]) Discussed diet and exercise for person with BMI >25 Will recheck weight in 3-6 months    Labs pending Health Maintenance reviewed Diet and exercise encouraged  Follow up plan: 6 months   Denise Hassell Done, FNP

## 2021-10-03 LAB — CBC WITH DIFFERENTIAL/PLATELET
Basophils Absolute: 0 10*3/uL (ref 0.0–0.2)
Basos: 1 %
EOS (ABSOLUTE): 0.1 10*3/uL (ref 0.0–0.4)
Eos: 3 %
Hematocrit: 40.7 % (ref 34.0–46.6)
Hemoglobin: 13.3 g/dL (ref 11.1–15.9)
Immature Grans (Abs): 0 10*3/uL (ref 0.0–0.1)
Immature Granulocytes: 0 %
Lymphocytes Absolute: 2.3 10*3/uL (ref 0.7–3.1)
Lymphs: 42 %
MCH: 29.2 pg (ref 26.6–33.0)
MCHC: 32.7 g/dL (ref 31.5–35.7)
MCV: 89 fL (ref 79–97)
Monocytes Absolute: 0.6 10*3/uL (ref 0.1–0.9)
Monocytes: 12 %
Neutrophils Absolute: 2.2 10*3/uL (ref 1.4–7.0)
Neutrophils: 42 %
Platelets: 292 10*3/uL (ref 150–450)
RBC: 4.56 x10E6/uL (ref 3.77–5.28)
RDW: 13.1 % (ref 11.7–15.4)
WBC: 5.3 10*3/uL (ref 3.4–10.8)

## 2021-10-03 LAB — CMP14+EGFR
ALT: 23 IU/L (ref 0–32)
AST: 21 IU/L (ref 0–40)
Albumin/Globulin Ratio: 1.4 (ref 1.2–2.2)
Albumin: 4.2 g/dL (ref 3.9–4.9)
Alkaline Phosphatase: 69 IU/L (ref 44–121)
BUN/Creatinine Ratio: 17 (ref 12–28)
BUN: 14 mg/dL (ref 8–27)
Bilirubin Total: 0.4 mg/dL (ref 0.0–1.2)
CO2: 22 mmol/L (ref 20–29)
Calcium: 9.5 mg/dL (ref 8.7–10.3)
Chloride: 103 mmol/L (ref 96–106)
Creatinine, Ser: 0.84 mg/dL (ref 0.57–1.00)
Globulin, Total: 3 g/dL (ref 1.5–4.5)
Glucose: 96 mg/dL (ref 70–99)
Potassium: 4.2 mmol/L (ref 3.5–5.2)
Sodium: 141 mmol/L (ref 134–144)
Total Protein: 7.2 g/dL (ref 6.0–8.5)
eGFR: 75 mL/min/{1.73_m2} (ref >59)

## 2021-10-03 LAB — LIPID PANEL
Chol/HDL Ratio: 4.9 ratio — ABNORMAL HIGH (ref 0.0–4.4)
Cholesterol, Total: 199 mg/dL (ref 100–199)
HDL: 41 mg/dL (ref >39)
LDL Chol Calc (NIH): 132 mg/dL — ABNORMAL HIGH (ref 0–99)
Triglycerides: 144 mg/dL (ref 0–149)
VLDL Cholesterol Cal: 26 mg/dL (ref 5–40)

## 2021-10-03 MED ORDER — ROSUVASTATIN CALCIUM 10 MG PO TABS: 10.0000 mg | ORAL_TABLET | Freq: Every day | ORAL | 1 refills | Status: DC

## 2021-10-03 NOTE — Addendum Note (Signed)
Addended by: Chevis Pretty on: 10/03/2021 09:26 AM   Modules accepted: Orders

## 2021-10-07 ENCOUNTER — Telehealth: Payer: Self-pay | Admitting: Nurse Practitioner

## 2021-10-07 NOTE — Telephone Encounter (Signed)
Pt aware of results aware crestor has been sent to pharmacy

## 2021-11-25 ENCOUNTER — Ambulatory Visit (INDEPENDENT_AMBULATORY_CARE_PROVIDER_SITE_OTHER): Payer: PPO | Admitting: Nurse Practitioner

## 2021-11-25 ENCOUNTER — Encounter: Payer: Self-pay | Admitting: Nurse Practitioner

## 2021-11-25 VITALS — BP 121/69 | HR 60 | Temp 97.5°F | Resp 20 | Ht 67.0 in | Wt 246.0 lb

## 2021-11-25 DIAGNOSIS — L03116 Cellulitis of left lower limb: Secondary | ICD-10-CM | POA: Diagnosis not present

## 2021-11-25 MED ORDER — CIPROFLOXACIN HCL 500 MG PO TABS: 500.0000 mg | ORAL_TABLET | Freq: Two times a day (BID) | ORAL | 0 refills | Status: DC

## 2021-11-25 NOTE — Progress Notes (Addendum)
Subjective:    Patient ID: [name], female    DOB: 09-24-1950, 71 y.o.   MRN: [id number]   Chief Complaint: Redness to left lower leg   HPI Patient has area on left lower leg that has scarring from old blood clot. The area started peeling last week and now it is red and sore to touch.    Review of Systems  Constitutional:  Negative for diaphoresis.  Eyes:  Negative for pain.  Respiratory:  Negative for shortness of breath.   Cardiovascular:  Negative for chest pain, palpitations and leg swelling.  Gastrointestinal:  Negative for abdominal pain.  Endocrine: Negative for polydipsia.  Skin:  Negative for rash.  Neurological:  Negative for dizziness, weakness and headaches.  Hematological:  Does not bruise/bleed easily.  All other systems reviewed and are negative.      Objective:   Physical Exam Vitals and nursing note reviewed.  Constitutional:      General: She is not in acute distress.    Appearance: Normal appearance. She is well-developed.  HENT:     Left Ear: Tympanic membrane normal.  Neck:     Vascular: No carotid bruit or JVD.  Cardiovascular:     Rate and Rhythm: Normal rate and regular rhythm.     Heart sounds: Normal heart sounds.  Pulmonary:     Effort: Pulmonary effort is normal. No respiratory distress.     Breath sounds: Normal breath sounds. No wheezing or rales.  Chest:     Chest wall: No tenderness.  Abdominal:     General: Bowel sounds are normal. There is no distension or abdominal bruit.     Palpations: Abdomen is soft. There is no hepatomegaly, splenomegaly, mass or pulsatile mass.     Tenderness: There is no abdominal tenderness.  Musculoskeletal:        General: Normal range of motion.     Cervical back: Normal range of motion and neck supple.  Lymphadenopathy:     Cervical: No cervical adenopathy.  Skin:    General: Skin is warm and dry.     Comments: Left  lower inner calf is erythematous and warm to touch.  Neurological:      Mental Status: She is alert and oriented to person, place, and time.     Deep Tendon Reflexes: Reflexes are normal and symmetric.  Psychiatric:        Behavior: Behavior normal.        Thought Content: Thought content normal.        Judgment: Judgment normal.     BP 121/69   Pulse 60   Temp (!) 97.5 F (36.4 C) (Temporal)   Resp 20   Ht '5\' 7"'$  (1.702 m)   Wt 246 lb (111.6 kg)   SpO2 99%   BMI 38.53 kg/m         Assessment & Plan:   [name] in today with chief complaint of Redness to left lower leg   1. Cellulitis of left lower leg Ice BID  Meds ordered this encounter  Medications   ciprofloxacin (CIPRO) 500 MG tablet    Sig: Take 1 tablet (500 mg total) by mouth 2 (two) times daily.    Dispense:  10 tablet    Refill:  0    Order Specific Question:   Supervising Provider    Answer:   Caryl Pina A [[id number]]      The above assessment and management plan was discussed with the patient.  The patient verbalized understanding of and has agreed to the management plan. Patient is aware to call the clinic if symptoms persist or worsen. Patient is aware when to return to the clinic for a follow-up visit. Patient educated on when it is appropriate to go to the emergency department.   [name] Hassell Done, FNP

## 2021-11-25 NOTE — Patient Instructions (Signed)
Cellulitis, Adult  Cellulitis is a skin infection. The infected area is often warm, red, swollen, and sore. It occurs most often in the arms and lower legs. It is very important to get treated for this condition. What are the causes? This condition is caused by bacteria. The bacteria enter through a break in the skin, such as a cut, burn, insect bite, open sore, or crack. What increases the risk? This condition is more likely to occur in people who: Have a weak body defense system (immune system). Have open cuts, burns, bites, or scrapes on the skin. Are older than 70 years of age. Have a blood sugar problem (diabetes). Have a long-lasting (chronic) liver disease (cirrhosis) or kidney disease. Are very overweight (obese). Have a skin problem, such as: Itchy rash (eczema). Slow movement of blood in the veins (venous stasis). Fluid buildup below the skin (edema). Have been treated with high-energy rays (radiation). Use IV drugs. What are the signs or symptoms? Symptoms of this condition include: Skin that is: Red. Streaking. Spotting. Swollen. Sore or painful when you touch it. Warm. A fever. Chills. Blisters. How is this diagnosed? This condition is diagnosed based on: Medical history. Physical exam. Blood tests. Imaging tests. How is this treated? Treatment for this condition may include: Medicines to treat infections or allergies. Home care, such as: Rest. Placing cold or warm cloths (compresses) on the skin. Hospital care, if the condition is very bad. Follow these instructions at home: Medicines Take over-the-counter and prescription medicines only as told by your doctor. If you were prescribed an antibiotic medicine, take it as told by your doctor. Do not stop taking it even if you start to feel better. General instructions  Drink enough fluid to keep your pee (urine) pale yellow. Do not touch or rub the infected area. Raise (elevate) the infected area above  the level of your heart while you are sitting or lying down. Place cold or warm cloths on the area as told by your doctor. Keep all follow-up visits as told by your doctor. This is important. Contact a doctor if: You have a fever. You do not start to get better after 1-2 days of treatment. Your bone or joint under the infected area starts to hurt after the skin has healed. Your infection comes back. This can happen in the same area or another area. You have a swollen bump in the area. You have new symptoms. You feel ill and have muscle aches and pains. Get help right away if: Your symptoms get worse. You feel very sleepy. You throw up (vomit) or have watery poop (diarrhea) for a long time. You see red streaks coming from the area. Your red area gets larger. Your red area turns dark in color. These symptoms may represent a serious problem that is an emergency. Do not wait to see if the symptoms will go away. Get medical help right away. Call your local emergency services (911 in the [place] Do not drive yourself to the hospital. Summary Cellulitis is a skin infection. The area is often warm, red, swollen, and sore. This condition is treated with medicines, rest, and cold and warm cloths. Take all medicines only as told by your doctor. Tell your doctor if symptoms do not start to get better after 1-2 days of treatment. This information is not intended to replace advice given to you by your health care provider. Make sure you discuss any questions you have with your health care provider. Document Revised: 10/23/2020 Document   Reviewed: 10/24/2020 Elsevier Patient Education  2023 [employer]  

## 2021-12-02 ENCOUNTER — Other Ambulatory Visit: Payer: Self-pay | Admitting: *Deleted

## 2021-12-15 ENCOUNTER — Other Ambulatory Visit: Payer: PPO

## 2021-12-15 DIAGNOSIS — C49A3 Gastrointestinal stromal tumor of small intestine: Secondary | ICD-10-CM | POA: Diagnosis not present

## 2021-12-15 DIAGNOSIS — E538 Deficiency of other specified B group vitamins: Secondary | ICD-10-CM | POA: Diagnosis not present

## 2021-12-15 NOTE — Addendum Note (Signed)
Addended by: Guerry Bruin on: 12/15/2021 09:06 AM   Modules accepted: Orders

## 2021-12-16 LAB — CBC WITH DIFFERENTIAL/PLATELET
Basophils Absolute: 0 10*3/uL (ref 0.0–0.2)
Basos: 0 %
EOS (ABSOLUTE): 0.1 10*3/uL (ref 0.0–0.4)
Eos: 2 %
Hematocrit: 41.7 % (ref 34.0–46.6)
Hemoglobin: 13.7 g/dL (ref 11.1–15.9)
Immature Grans (Abs): 0 10*3/uL (ref 0.0–0.1)
Immature Granulocytes: 0 %
Lymphocytes Absolute: 2 10*3/uL (ref 0.7–3.1)
Lymphs: 36 %
MCH: 29.5 pg (ref 26.6–33.0)
MCHC: 32.9 g/dL (ref 31.5–35.7)
MCV: 90 fL (ref 79–97)
Monocytes Absolute: 0.6 10*3/uL (ref 0.1–0.9)
Monocytes: 11 %
Neutrophils Absolute: 2.9 10*3/uL (ref 1.4–7.0)
Neutrophils: 51 %
Platelets: 300 10*3/uL (ref 150–450)
RBC: 4.65 x10E6/uL (ref 3.77–5.28)
RDW: 12.8 % (ref 11.7–15.4)
WBC: 5.6 10*3/uL (ref 3.4–10.8)

## 2021-12-16 LAB — COMPREHENSIVE METABOLIC PANEL
ALT: 22 IU/L (ref 0–32)
AST: 20 IU/L (ref 0–40)
Albumin/Globulin Ratio: 1.4 (ref 1.2–2.2)
Albumin: 4 g/dL (ref 3.9–4.9)
Alkaline Phosphatase: 72 IU/L (ref 44–121)
BUN/Creatinine Ratio: 19 (ref 12–28)
BUN: 16 mg/dL (ref 8–27)
Bilirubin Total: 0.3 mg/dL (ref 0.0–1.2)
CO2: 25 mmol/L (ref 20–29)
Calcium: 9.6 mg/dL (ref 8.7–10.3)
Chloride: 102 mmol/L (ref 96–106)
Creatinine, Ser: 0.85 mg/dL (ref 0.57–1.00)
Globulin, Total: 2.9 g/dL (ref 1.5–4.5)
Glucose: 82 mg/dL (ref 70–99)
Potassium: 3.3 mmol/L — ABNORMAL LOW (ref 3.5–5.2)
Sodium: 140 mmol/L (ref 134–144)
Total Protein: 6.9 g/dL (ref 6.0–8.5)
eGFR: 74 mL/min/{1.73_m2} (ref >59)

## 2021-12-16 LAB — VITAMIN B12: Vitamin B-12: 519 pg/mL (ref 232–1245)

## 2021-12-24 ENCOUNTER — Encounter: Payer: Self-pay | Admitting: Hematology

## 2021-12-24 ENCOUNTER — Inpatient Hospital Stay: Payer: PPO | Attending: Hematology | Admitting: Hematology

## 2021-12-24 VITALS — BP 131/83 | HR 57 | Temp 98.7°F | Resp 18 | Wt 242.0 lb

## 2021-12-24 DIAGNOSIS — Z90710 Acquired absence of both cervix and uterus: Secondary | ICD-10-CM | POA: Insufficient documentation

## 2021-12-24 DIAGNOSIS — D6851 Activated protein C resistance: Secondary | ICD-10-CM | POA: Insufficient documentation

## 2021-12-24 DIAGNOSIS — Z7901 Long term (current) use of anticoagulants: Secondary | ICD-10-CM | POA: Diagnosis not present

## 2021-12-24 DIAGNOSIS — D510 Vitamin B12 deficiency anemia due to intrinsic factor deficiency: Secondary | ICD-10-CM | POA: Insufficient documentation

## 2021-12-24 DIAGNOSIS — E538 Deficiency of other specified B group vitamins: Secondary | ICD-10-CM

## 2021-12-24 DIAGNOSIS — C49A3 Gastrointestinal stromal tumor of small intestine: Secondary | ICD-10-CM | POA: Diagnosis not present

## 2021-12-24 DIAGNOSIS — Z86718 Personal history of other venous thrombosis and embolism: Secondary | ICD-10-CM | POA: Insufficient documentation

## 2021-12-24 DIAGNOSIS — I10 Essential (primary) hypertension: Secondary | ICD-10-CM | POA: Insufficient documentation

## 2021-12-24 DIAGNOSIS — R748 Abnormal levels of other serum enzymes: Secondary | ICD-10-CM | POA: Insufficient documentation

## 2021-12-24 NOTE — Patient Instructions (Signed)
[place] at [facility] Discharge Instructions   You were seen and examined today by Dr. Delton Coombes.  He reviewed the results of your lab work which are normal.   We will see you back in one year. We will repeat lab work prior to your next visit.    Thank you for choosing [place] at [facility] to provide your oncology and hematology care.  To afford each patient quality time with our provider, please arrive at least 15 minutes before your scheduled appointment time.   If you have a lab appointment with the [facility] please come in thru the Main Entrance and check in at the main information desk.  You need to re-schedule your appointment should you arrive 10 or more minutes late.  We strive to give you quality time with our providers, and arriving late affects you and other patients whose appointments are after yours.  Also, if you no show three or more times for appointments you may be dismissed from the clinic at the providers discretion.     Again, thank you for choosing [facility].  Our hope is that these requests will decrease the amount of time that you wait before being seen by our physicians.       _____________________________________________________________  Should you have questions after your visit to [facility], please contact our office at [phone] and follow the prompts.  Our office hours are 8:00 a.m. and 4:30 p.m. Monday - Friday.  Please note that voicemails left after 4:00 p.m. may not be returned until the following business day.  We are closed weekends and major holidays.  You do have access to a nurse 24-7, just call the main number to the clinic [phone] and do not press any options, hold on the line and a nurse will answer the phone.    For prescription refill requests, have your pharmacy contact our office and allow 72 hours.    Due to Covid, you will need to wear a mask upon  entering the hospital. If you do not have a mask, a mask will be given to you at the Main Entrance upon arrival. For doctor visits, patients may have 1 support person age 70 or older with them. For treatment visits, patients can not have anyone with them due to social distancing guidelines and our immunocompromised population.

## 2021-12-24 NOTE — Progress Notes (Signed)
[place], [place] [address]   CLINIC:  Medical Oncology/Hematology  PCP:  Chevis Pretty, [place] / [place] [address] [phone]   REASON FOR VISIT:  Follow-up for factor V Leiden mutation AND GIST of small intestine  PRIOR THERAPY:  1. Partial small bowel resection on 01/05/2011. 2. Adjuvant imatinib stopped due to cardiac arrhythmias  NGS Results: not done  CURRENT THERAPY: Xarelto and observation  BRIEF ONCOLOGIC HISTORY:  Oncology History  GIST (gastrointestinal stromal tumor), malignant ([facility])  01/03/2011 Initial Diagnosis   GIST (gastrointestinal stromal tumor), malignant ([facility])   01/03/2011 Imaging   CT abd/pelvis- 5.0 cm soft tissue mass associated with distal small bowel. Findings characteristic of gastrointestinal stromal tumor. Adenocarcinoma or malignant transformation are not excluded.   Stable left adrenal adenoma.   04/03/2011 Imaging   CT abd/pelvis- Small bowel obstruction at small bowel anastomosis. Single loop of small bowel in the left mid abdomen shows wall thickening, nonspecific; this can be seen with infection, inflammatory bowel disease and ischemia. Free intraperitoneal fluid. Small right pleural effusion and bibasilar atelectasis. Left adrenal adenoma.   01/25/2012 Imaging   CT abd/pelvis- Prior small bowel resection with anastomoses in the left mid abdomen.  No evidence of metastatic disease in the abdomen/pelvis.   No evidence of bowel obstruction.   Focal eccentric wall thickening along the lateral aspect of the cecum.  Colonoscopy is suggested to exclude a primary colonic neoplasm.     01/25/2013 Imaging   CT abd/pelvis- 1. Thickening through the gastric cardiac region likely represents redundant folds of normal mucosa. Recommend attention on follow-up. 2. Mild haziness to the central mesentery is similar to and prior likely related to prior bowel surgery. No  evidence of GIST recurrence within the small bowel. 3. No evidence of adenopathy or mass in the abdomen or pelvis. 4. Stable left adrenal adenoma. 5. 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   01/15/2014 Imaging   CT abd/pelvis- Stable exam. No evidence for recurrent disease in the abdomen or pelvis.   Stable left adrenal adenoma.   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   01/10/2015 Imaging   CT abd/pelvis- 1. Stable exam. No evidence for recurrent disease within the abdomen or pelvis. 2. Stable left adrenal gland adenoma. 3. 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   01/23/2015 Imaging   CT abd/pelvis- Small bowel obstruction with a transition zone just distal to the ileo ileal anastomosis in the anterior pelvis and secondary to peritoneal adhesions.     CANCER STAGING:  Cancer Staging  GIST (gastrointestinal stromal tumor), malignant ([facility]) Staging form: Soft Tissue Sarcoma, AJCC 7th Edition - Clinical: Stage IIB (T2, N0, M0) - Signed by Baird Cancer, PA on 01/29/2011   INTERVAL HISTORY:  [name], a 71 y.o. female, seen for follow-up of GIST of small intestine, factor V Leiden mutation with recurrent DVT and B12 deficiency.  She reports that she was investigated by Dr. Ernst Bowler because of allergic reaction to bees and was found to have elevated tryptase levels.  Her daughter also has elevated tryptase levels.  She has developed new allergies to salmon and shellfish.  She is already allergic to pecans for many years.  She had to take Benadryl couple of times for swelling in the mouth after eating salmon.  REVIEW OF SYSTEMS:  Review of Systems  Constitutional:  Negative for appetite change and fatigue.  Psychiatric/Behavioral:  Positive for sleep disturbance.   All other systems reviewed and are negative.   PAST MEDICAL/SURGICAL HISTORY:  Past Medical History:   Diagnosis Date   Allergic urticaria 01/04/2011   Rash from tape.   Anxiety    Cellulitis of left leg 2006   Clotting disorder ([facility])    heterozygosity from factor v leiden   DVT (deep venous thrombosis) ([facility]) 07/10/2010   on coumadin   Dyspnea    with exertion   Factor V Leiden ([name])    GERD (gastroesophageal reflux disease)    GIST (gastrointestinal stromal tumor), malignant ([facility]) 01/03/2011   S/P resection on 01/05/11.  Intolerant to [employer]. Skin Cancer Left hand   History of blood transfusion    Hypertension    Pernicious anemia 07/10/2010   Small bowel mass 01/03/2011   s/p surgery   Ulcer 05/2009   esophageal   Ventricular tachycardia ([facility]) 03/26/11   Vitamin B12 deficiency    vit b12 1000 mcg monthly   Past Surgical History:  Procedure Laterality Date   ABDOMINAL HYSTERECTOMY  1989   BALLOON DILATION  12/11/2010   Procedure: BALLOON DILATION;  Surgeon: Rogene Houston, MD;  [facility];  Service: Endoscopy;  Laterality: N/A;   BOWEL RESECTION  01/05/2011   Procedure: SMALL BOWEL RESECTION;  Surgeon: Jamesetta So;  Location: [facility];  Service: General;;  Partial Small Bowel Resection   COLONOSCOPY  02/25/2012   Procedure: COLONOSCOPY;  Surgeon: Rogene Houston, MD;  Location[facility];  Service: Endoscopy;  Laterality: N/A;  [place]  01/02/2011   Procedure: GIVENS CAPSULE STUDY;  Surgeon: Rogene Houston, MD;  Location[facility];  Service: Endoscopy;  Laterality: N/A;   LAPAROTOMY  01/05/2011   Procedure: EXPLORATORY LAPAROTOMY;  Surgeon: Jamesetta So;  Location: [facility];  Service: General;  Laterality: N/A;   OPEN REDUCTION INTERNAL FIXATION (ORIF) SCAPHOID WITH DISTAL RADIUS GRAFT Right 02/01/2016   Procedure: Right distal radius open reduction and internal fixation and repair as indicated;  Surgeon: Iran Planas, MD;  Location: [facility];  Service: Orthopedics;  Laterality: Right;  Requests 90 mins    SOCIAL HISTORY:  Social History    Socioeconomic History   Marital status: Married    Spouse name: Jeneen Rinks   Number of children: 3   Years of education: Not on file   Highest education level: Not on file  Occupational History    Employer: [employer]   Occupation: CNA    Employer: [employer]  Tobacco Use   Smoking status: Never   Smokeless tobacco: Never  Vaping Use   Vaping Use: Never used  Substance and Sexual Activity   Alcohol use: No   Drug use: No   Sexual activity: Not Currently  Other Topics Concern   Not on file  Social History Narrative   Married x 53 years.   2 children living. Youngest son died in [place].   2 granddaughters   Social Determinants of Health   Financial Resource Strain: Low Risk  (02/05/2021)   Overall Financial Resource Strain (CARDIA)    Difficulty of Paying Living Expenses: Not hard at all  Food Insecurity: No Food Insecurity (02/05/2021)   Hunger Vital Sign    Worried About Running Out of Food in the Last Year: Never true    Ran Out of Food in the Last Year: Never true  Transportation Needs: No  Transportation Needs (02/05/2021)   PRAPARE - Hydrologist (Medical): No    Lack of Transportation (Non-Medical): No  Physical Activity: Inactive (02/05/2021)   Exercise Vital Sign    Days of Exercise per Week: 0 days    Minutes of Exercise per Session: 0 min  Stress: No Stress Concern Present (02/05/2021)   [place]    Feeling of Stress : Not at all  Social Connections: [place] (02/05/2021)   Social Connection and Isolation Panel [NHANES]    Frequency of Communication with Friends and Family: More than three times a week    Frequency of Social Gatherings with Friends and Family: More than three times a week    Attends Religious Services: More than 4 times per year    Active Member of [employer] or Organizations: Yes    Attends Music therapist: More than 4 times per  year    Marital Status: Married  Human resources officer Violence: Not At Risk (02/05/2021)   Humiliation, Afraid, Rape, and Kick questionnaire    Fear of Current or Ex-Partner: No    Emotionally Abused: No    Physically Abused: No    Sexually Abused: No    FAMILY HISTORY:  Family History  Problem Relation Age of Onset   Depression Mother    Parkinson's disease Mother    Allergic rhinitis Brother    Angioedema Neg Hx    Asthma Neg Hx    Eczema Neg Hx    Immunodeficiency Neg Hx    Urticaria Neg Hx     CURRENT MEDICATIONS:  Current Outpatient Medications  Medication Sig Dispense Refill   acetaminophen (TYLENOL) 500 MG tablet Take 500-1,000 mg by mouth every 6 (six) hours as needed for moderate pain.      ciprofloxacin (CIPRO) 500 MG tablet Take 1 tablet (500 mg total) by mouth 2 (two) times daily. 10 tablet 0   cyanocobalamin (VITAMIN B12) 1000 MCG/ML injection INJECT 1ML IM EVERY 30 DAYS 1 mL 11   diphenhydrAMINE (BENADRYL) 25 mg capsule Take 25 mg by mouth every 6 (six) hours as needed for allergies (bee stings).      losartan-hydrochlorothiazide (HYZAAR) 100-25 MG tablet Take 1 tablet by mouth daily. 90 tablet 1   metoprolol tartrate (LOPRESSOR) 25 MG tablet Take 0.5 tablets (12.5 mg total) by mouth 2 (two) times daily. (Needs to be seen before next refill) 180 tablet 1   omeprazole (PRILOSEC) 20 MG capsule Take 1 capsule (20 mg total) by mouth daily. 90 capsule 1   rosuvastatin (CRESTOR) 10 MG tablet Take 1 tablet (10 mg total) by mouth daily. 90 tablet 1   XARELTO 20 MG TABS tablet Take 1 tablet (20 mg total) by mouth daily with supper. 30 tablet 11   No current facility-administered medications for this visit.    ALLERGIES:  Allergies  Allergen Reactions   Bee Venom Swelling and Rash    SWELLING REACTION UNSPECIFIED  SWELLING REACTION UNSPECIFIED    Cephalexin Swelling    PATIENT WITH Rx OF ANAPHYLAXIS TO PCN's SWELLING REACTION UNSPECIFIED  PATIENT WITH Rx OF ANAPHYLAXIS  TO PCN's SWELLING REACTION UNSPECIFIED    Dexlansoprazole Swelling    SWELLING REACTION UNSPECIFIED  SWELLING REACTION UNSPECIFIED    Imatinib Other (See Comments)    Cardiac dysrhythmia   Other Swelling    PECANS MOUTH SWELLS   Penicillins Anaphylaxis    Has patient had a PCN reaction causing immediate  rash, facial/tongue/throat swelling, SOB or lightheadedness with hypotension: No no Has patient had a PCN reaction causing severe rash involving mucus membranes or skin necrosis: No Has patient had a PCN reaction that required hospitalization No Has patient had a PCN reaction occurring within the last 10 years: No If all of the above answers are "NO", then may proceed with Cephalosporin use.    Ace Inhibitors Other (See Comments)    angioedema   Doxycycline Hyclate    Latex Itching   Nylon Rash   Sulfa Antibiotics Rash   Sulfonamide Derivatives Rash   Tape Itching    Paper tape is ok Paper tape is ok    PHYSICAL EXAM:  Performance status (ECOG): 1 - Symptomatic but completely ambulatory  There were no vitals filed for this visit. Wt Readings from Last 3 Encounters:  11/25/21 246 lb (111.6 kg)  10/02/21 248 lb (112.5 kg)  05/07/21 246 lb 6.4 oz (111.8 kg)   Physical Exam Vitals reviewed.  Constitutional:      Appearance: Normal appearance.  Cardiovascular:     Rate and Rhythm: Normal rate and regular rhythm.     Pulses: Normal pulses.     Heart sounds: Normal heart sounds.  Pulmonary:     Effort: Pulmonary effort is normal.     Breath sounds: Normal breath sounds.  Abdominal:     Palpations: Abdomen is soft. There is no hepatomegaly, splenomegaly or mass.     Tenderness: There is no abdominal tenderness.  Musculoskeletal:     Right lower leg: No edema.     Left lower leg: No edema.  Lymphadenopathy:     Cervical: No cervical adenopathy.     Right cervical: No superficial cervical adenopathy.    Left cervical: No superficial cervical adenopathy.     Upper Body:      Right upper body: No supraclavicular adenopathy.     Left upper body: No supraclavicular adenopathy.  Neurological:     General: No focal deficit present.     Mental Status: She is alert and oriented to person, place, and time.  Psychiatric:        Mood and Affect: Mood normal.        Behavior: Behavior normal.     LABORATORY DATA:  I have reviewed the labs as listed.     Latest Ref Rng & Units 12/15/2021    9:08 AM 10/02/2021    9:00 AM 04/22/2021    3:34 PM  CBC  WBC 3.4 - 10.8 x10E3/uL 5.6  5.3  9.1   Hemoglobin 11.1 - 15.9 g/dL 13.7  13.3  13.8   Hematocrit 34.0 - 46.6 % 41.7  40.7  41.7   Platelets 150 - 450 x10E3/uL 300  292  309       Latest Ref Rng & Units 12/15/2021    9:08 AM 10/02/2021    9:00 AM 12/23/2020    9:34 AM  CMP  Glucose 70 - 99 mg/dL 82  96  87   BUN 8 - 27 mg/dL '16  14  14   '$ Creatinine 0.57 - 1.00 mg/dL 0.85  0.84  0.78   Sodium 134 - 144 mmol/L 140  141  138   Potassium 3.5 - 5.2 mmol/L 3.3  4.2  3.4   Chloride 96 - 106 mmol/L 102  103  104   CO2 20 - 29 mmol/L '25  22  26   '$ Calcium 8.7 - 10.3 mg/dL 9.6  9.5  9.4  Total Protein 6.0 - 8.5 g/dL 6.9  7.2  7.4   Total Bilirubin 0.0 - 1.2 mg/dL 0.3  0.4  0.3   Alkaline Phos 44 - 121 IU/L 72  69  69   AST 0 - 40 IU/L '20  21  22   '$ ALT 0 - 32 IU/L '22  23  25     '$ DIAGNOSTIC IMAGING:  I have independently reviewed the scans and discussed with the patient. No results found.   ASSESSMENT:  1.  T3N0 Gist of small bowel: -Status post resection on 01/05/2011, pathology showing 5.8 cm, 0/8 lymph nodes involved, margins negative, 1 mitosis/50 HPF, intermediate grade.  Presentation was with GI bleed. -Adjuvant imatinib complicated by cardiac arrhythmias. -Last CT CAP dated 01/31/2017 did not show any evidence of metastatic disease.   2.  Recurrent DVT: -She was on warfarin for 32 years and was switched to Xarelto in August 2018. - She also has factor V Leiden heterozygosity..   3.  Pernicious  anemia: -She is on B12 injections monthly.   4.  Health maintenance: -Mammogram on 05/18/2019 was BI-RADS Category 1.   PLAN:  1.  T3N0 Gist of small bowel: - Initial presentation with GI bleed.  She denies any GI bleed at this time.  No abdominal pains. - Physical exam was normal.  RTC 1 year for follow-up.   2.  Recurrent DVT: - Continue Xarelto indefinitely.  No bleeding issues noted.   3.  Pernicious anemia: - Continue B12 injections every 4 weeks. - Labs on 12/15/2021 shows B12 is 519.  4.  Elevated tryptase levels: - She always had allergy reaction to bees.  Recently she had new onset allergy to salmon and shellfish and developed mouth swelling since summer 2023 after eating both of them.  She always had allergy to pecans.  She was seen by Dr. Ernst Bowler and was found to have elevated tryptase levels.  She had to take Benadryl couple of times after eating above foods.  Last use of EpiPen was last year after she had allergic reaction to bees. - Her daughter Larena Sox also has elevated tryptase levels.   Orders placed this encounter:  No orders of the defined types were placed in this encounter.    Derek Jack, MD [facility] [phone]

## 2022-01-05 ENCOUNTER — Ambulatory Visit: Payer: PPO | Admitting: Hematology

## 2022-01-28 ENCOUNTER — Telehealth: Payer: Self-pay | Admitting: *Deleted

## 2022-01-28 ENCOUNTER — Ambulatory Visit: Payer: PPO | Admitting: Nurse Practitioner

## 2022-01-28 ENCOUNTER — Telehealth: Payer: Self-pay | Admitting: Nurse Practitioner

## 2022-01-28 NOTE — Telephone Encounter (Signed)
Patient called to advise that her right eye sclera is filled with blood.  She is on Xarelto and was told to go to the ER for evaluation.  Verbalized understanding.

## 2022-01-28 NOTE — Telephone Encounter (Signed)
Called patient appt made

## 2022-01-29 ENCOUNTER — Ambulatory Visit (INDEPENDENT_AMBULATORY_CARE_PROVIDER_SITE_OTHER): Payer: PPO | Admitting: Nurse Practitioner

## 2022-01-29 ENCOUNTER — Encounter: Payer: Self-pay | Admitting: Nurse Practitioner

## 2022-01-29 VITALS — BP 115/73 | HR 65 | Temp 97.9°F | Resp 20 | Ht 67.0 in | Wt 247.0 lb

## 2022-01-29 DIAGNOSIS — H1132 Conjunctival hemorrhage, left eye: Secondary | ICD-10-CM

## 2022-01-29 DIAGNOSIS — I82522 Chronic embolism and thrombosis of left iliac vein: Secondary | ICD-10-CM

## 2022-01-29 LAB — COAGUCHEK XS/INR WAIVED
INR: 1.2 — ABNORMAL HIGH (ref 0.9–1.1)
Prothrombin Time: 14.8 s

## 2022-01-29 NOTE — Patient Instructions (Signed)
Subconjunctival Hemorrhage Subconjunctival hemorrhage is bleeding that happens between the white part of your eye (sclera) and the clear membrane that covers the outside of your eye (conjunctiva). There are many tiny blood vessels near the surface of your eye. A subconjunctival hemorrhage happens when one or more of these vessels breaks and bleeds, causing a red patch to appear on your eye. This is similar to a bruise. Depending on the amount of bleeding, the red patch may only cover a small area of your eye or it may cover the entire visible part of the sclera. If a lot of blood collects under the conjunctiva, there may also be swelling. Subconjunctival hemorrhages do not affect your vision or cause pain, but your eye may feel irritated if there is swelling. Subconjunctival hemorrhages usually do not require treatment, and they usually disappear on their own within two to four weeks. What are the causes? This condition may be caused by: Mild trauma, such as rubbing your eye too hard. Blunt injuries, such as from playing sports or coming into contact with a deployed airbag. Coughing, sneezing, or vomiting. Straining, such as when lifting a heavy object. Medical conditions, such as: High blood pressure. Diabetes. Recent eye surgery. Certain medicines, especially blood thinners (anticoagulants), including aspirin. Other conditions, such as eye tumors, bleeding disorders, or blood vessel abnormalities. Subconjunctival hemorrhages can also happen without an obvious cause. What are the signs or symptoms? Symptoms of this condition include: A bright red or dark red patch on the white part of the eye. The red area may: Spread out to cover a larger area of the eye before it goes away. Turn colors such as pink or brownish-yellow before it goes away. Swelling around the eye. Mild eye irritation. How is this diagnosed? This condition is diagnosed with a physical exam. If your subconjunctival hemorrhage  was caused by trauma, your health care provider may refer you to an eye specialist (ophthalmologist) or another specialist to check for other injuries. You may have other tests, including: An eye exam including a vision test, checking your eye with a type of microscope (slit lamp) and measuring the pressure in your eye. Your eye may be dilated, especially if your subconjunctival hemorrhage was caused by trauma. A blood pressure check. Blood tests to check for bleeding disorders. If your subconjunctival hemorrhage was caused by trauma, X-rays or a CT scan may be done to check for other injuries. How is this treated? Usually, treatment is not needed for this condition. If you have discomfort, your health care provider may recommend eye drops or cold compresses. Follow these instructions at home: Take over-the-counter and prescription medicines only as directed by your health care provider. Use eye drops or cold compresses to help with discomfort as directed by your health care provider. Avoid activities, things, and environments that may irritate or injure your eye. Keep all follow-up visits. This is important. Contact a health care provider if: You have pain in your eye. The bleeding does not go away within 4 weeks. You keep getting new subconjunctival hemorrhages. Get help right away if: Your vision changes, you have difficulty seeing, or you develop double vision. You suddenly develop severe sensitivity to light. You develop a severe headache, persistent vomiting, confusion, or abnormal tiredness (lethargy). Your eye seems to bulge or protrude from your eye socket. You develop unexplained bruises on your body. You have unexplained bleeding in another area of your body. These symptoms may represent a serious problem that is an emergency. Do not   wait to see if the symptoms will go away. Get medical help right away. Call your local emergency services (911 in the [place] Do not drive yourself to  the hospital. Summary Subconjunctival hemorrhage is bleeding that happens between the white part of your eye and the clear membrane that covers the outside of your eye. This condition is similar to a bruise. Subconjunctival hemorrhages usually do not require treatment, and they usually disappear on their own within two to four weeks. Use eye drops or cold compresses to help with discomfort as directed by your health care provider. This information is not intended to replace advice given to you by your health care provider. Make sure you discuss any questions you have with your health care provider. Document Revised: 03/20/2020 Document Reviewed: 03/20/2020 Elsevier Patient Education  2023 Elsevier Inc.  

## 2022-01-29 NOTE — Progress Notes (Signed)
Subjective:    Patient ID: [name], female    DOB: 01-Nov-1950, 72 y.o.   MRN: [id number]   Chief Complaint: Left eye blood shot   HPI  Patient says that her left eye turned red over the weekend. She is on xeralto and her cancer doctor told her she needed to have protime and INR- that she may be bleeding. Denies any itching or draining.    Review of Systems  Constitutional:  Negative for diaphoresis.  Eyes:  Negative for pain.  Respiratory:  Negative for shortness of breath.   Cardiovascular:  Negative for chest pain, palpitations and leg swelling.  Gastrointestinal:  Negative for abdominal pain.  Endocrine: Negative for polydipsia.  Skin:  Negative for rash.  Neurological:  Negative for dizziness, weakness and headaches.  Hematological:  Does not bruise/bleed easily.  All other systems reviewed and are negative.      Objective:   Physical Exam Vitals and nursing note reviewed.  Constitutional:      General: She is not in acute distress.    Appearance: Normal appearance. She is well-developed.  HENT:     Head: Normocephalic.     Right Ear: Tympanic membrane normal.     Left Ear: Tympanic membrane normal.  Eyes:     General: No scleral icterus.       Right eye: No discharge.        Left eye: No discharge.     Pupils: Pupils are equal, round, and reactive to light.     Comments: Left inner scleral injection. No drainage  Neck:     Vascular: No carotid bruit or JVD.  Cardiovascular:     Rate and Rhythm: Normal rate and regular rhythm.     Heart sounds: Normal heart sounds.  Pulmonary:     Effort: Pulmonary effort is normal. No respiratory distress.     Breath sounds: Normal breath sounds. No wheezing or rales.  Chest:     Chest wall: No tenderness.  Abdominal:     General: There is no distension or abdominal bruit.     Palpations: There is no hepatomegaly, splenomegaly, mass or pulsatile mass.     Tenderness: There is no abdominal tenderness.   Musculoskeletal:        General: Normal range of motion.     Cervical back: Normal range of motion and neck supple.  Lymphadenopathy:     Cervical: No cervical adenopathy.  Skin:    General: Skin is warm and dry.  Neurological:     Mental Status: She is alert and oriented to person, place, and time.     Deep Tendon Reflexes: Reflexes are normal and symmetric.  Psychiatric:        Behavior: Behavior normal.        Thought Content: Thought content normal.        Judgment: Judgment normal.     BP 115/73   Pulse 65   Temp 97.9 F (36.6 C) (Temporal)   Resp 20   Ht '5\' 7"'$  (1.702 m)   Wt 247 lb (112 kg)   SpO2 97%   BMI 38.69 kg/m   PT 14.8  INR 1.2      Assessment & Plan:   [name] in today with chief complaint of Left eye blood shot   1. Subconjunctival hemorrhage left eye Ice Avoid rubbing 'RTO if not improving or worsen  2. Chronic deep vein thrombosis (DVT) of iliac vein of left lower extremity ([name])  Labs reviewed - CoaguChek XS/INR Waived    The above assessment and management plan was discussed with the patient. The patient verbalized understanding of and has agreed to the management plan. Patient is aware to call the clinic if symptoms persist or worsen. Patient is aware when to return to the clinic for a follow-up visit. Patient educated on when it is appropriate to go to the emergency department.   [name] Hassell Done, FNP

## 2022-04-01 NOTE — Progress Notes (Unsigned)
Subjective:    Patient ID: [name], female    DOB: 22-Nov-1950, 72 y.o.   MRN: [id number]   Chief Complaint: annual physical   HPI:  [name] is a 72 y.o. who identifies as a female who was assigned female at birth.   Social history: Lives with: husband Work history: retired   Scientist, forensic in today for follow up of the following chronic medical issues:  1. Primary hypertension No c/o chest pain, sob or headache. Does not check blood pressure at home. BP Readings from Last 3 Encounters:  01/29/22 115/73  12/24/21 131/83  11/25/21 121/69     2. SVT (supraventricular tachycardia) Denies Tachardia. Is on daily metoprolol.  3. Gastroesophageal reflux disease without esophagitis Is on omperpazole as needed.  4. Other iron deficiency anemia Lab Results  Component Value Date   HGB 13.7 12/15/2021     5. Pernicious anemia Lab Results  Component Value Date   [id number] 12/15/2021     6. Factor V Leiden Is on xarelto daily. No bleeding issues  7. Recurrent major depressive disorder, in partial remission ([name]) Currently on no antidepressant     04/02/2022    7:57 AM 01/29/2022    9:50 AM 11/25/2021   11:02 AM  Depression screen PHQ 2/9  Decreased Interest 0 0 0  Down, Depressed, Hopeless 0 0 0  PHQ - 2 Score 0 0 0  Altered sleeping 0 0 0  Tired, decreased energy 0 0 0  Change in appetite 0 0 0  Feeling bad or failure about yourself  0 0 0  Trouble concentrating 0 0 0  Moving slowly or fidgety/restless 0 0 0  Suicidal thoughts 0 0 0  PHQ-9 Score 0 0 0  Difficult doing work/chores Not difficult at all Not difficult at all Not difficult at all    8. Morbid obesity ([name]) No recent weight changes Wt Readings from Last 3 Encounters:  04/02/22 246 lb (111.6 kg)  01/29/22 247 lb (112 kg)  12/24/21 242 lb (109.8 kg)   BMI Readings from Last 3 Encounters:  04/02/22 38.53 kg/m  01/29/22 38.69 kg/m  12/24/21 37.90 kg/m     New  complaints: None today  Allergies  Allergen Reactions   Bee Venom Swelling and Rash    SWELLING REACTION UNSPECIFIED  SWELLING REACTION UNSPECIFIED    Cephalexin Swelling    PATIENT WITH Rx OF ANAPHYLAXIS TO PCN's SWELLING REACTION UNSPECIFIED  PATIENT WITH Rx OF ANAPHYLAXIS TO PCN's SWELLING REACTION UNSPECIFIED    Dexlansoprazole Swelling    SWELLING REACTION UNSPECIFIED  SWELLING REACTION UNSPECIFIED    Imatinib Other (See Comments)    Cardiac dysrhythmia   Other Swelling    PECANS MOUTH SWELLS   Penicillins Anaphylaxis    Has patient had a PCN reaction causing immediate rash, facial/tongue/throat swelling, SOB or lightheadedness with hypotension: No no Has patient had a PCN reaction causing severe rash involving mucus membranes or skin necrosis: No Has patient had a PCN reaction that required hospitalization No Has patient had a PCN reaction occurring within the last 10 years: No If all of the above answers are "NO", then may proceed with Cephalosporin use.    Ace Inhibitors Other (See Comments)    angioedema   Doxycycline Hyclate    Latex Itching   Nylon Rash   Sulfa Antibiotics Rash   Sulfonamide Derivatives Rash   Tape Itching    Paper tape is ok Paper tape is ok   Outpatient  Encounter Medications as of 04/02/2022  Medication Sig   acetaminophen (TYLENOL) 500 MG tablet Take 500-1,000 mg by mouth every 6 (six) hours as needed for moderate pain.    cyanocobalamin (VITAMIN B12) 1000 MCG/ML injection INJECT 1ML IM EVERY 30 DAYS   diphenhydrAMINE (BENADRYL) 25 mg capsule Take 25 mg by mouth every 6 (six) hours as needed for allergies (bee stings).    losartan-hydrochlorothiazide (HYZAAR) 100-25 MG tablet Take 1 tablet by mouth daily.   metoprolol tartrate (LOPRESSOR) 25 MG tablet Take 0.5 tablets (12.5 mg total) by mouth 2 (two) times daily. (Needs to be seen before next refill)   omeprazole (PRILOSEC) 20 MG capsule Take 1 capsule (20 mg total) by mouth daily.   XARELTO  20 MG TABS tablet Take 1 tablet (20 mg total) by mouth daily with supper.   No facility-administered encounter medications on file as of 04/02/2022.    Past Surgical History:  Procedure Laterality Date   ABDOMINAL HYSTERECTOMY  1989   BALLOON DILATION  12/11/2010   Procedure: BALLOON DILATION;  Surgeon: Rogene Houston, MD;  Location: [facility];  Service: Endoscopy;  Laterality: N/A;   BOWEL RESECTION  01/05/2011   Procedure: SMALL BOWEL RESECTION;  Surgeon: Jamesetta So;  Location: [facility];  Service: General;;  Partial Small Bowel Resection   COLONOSCOPY  02/25/2012   Procedure: COLONOSCOPY;  Surgeon: Rogene Houston, MD;  Location: [facility];  Service: Endoscopy;  Laterality: N/A;  [name]  01/02/2011   Procedure: GIVENS CAPSULE STUDY;  Surgeon: Rogene Houston, MD;  Location: [facility];  Service: Endoscopy;  Laterality: N/A;   LAPAROTOMY  01/05/2011   Procedure: EXPLORATORY LAPAROTOMY;  Surgeon: Jamesetta So;  Location: [facility];  Service: General;  Laterality: N/A;   OPEN REDUCTION INTERNAL FIXATION (ORIF) SCAPHOID WITH DISTAL RADIUS GRAFT Right 02/01/2016   Procedure: Right distal radius open reduction and internal fixation and repair as indicated;  Surgeon: Iran Planas, MD;  Location: [facility];  Service: Orthopedics;  Laterality: Right;  Requests 90 mins    Family History  Problem Relation Age of Onset   Depression Mother    Parkinson's disease Mother    Allergic rhinitis Brother    Angioedema Neg Hx    Asthma Neg Hx    Eczema Neg Hx    Immunodeficiency Neg Hx    Urticaria Neg Hx       Controlled substance contract: n/a     Review of Systems  Constitutional:  Negative for diaphoresis.  Eyes:  Negative for pain.  Respiratory:  Negative for shortness of breath.   Cardiovascular:  Negative for chest pain, palpitations and leg swelling.  Gastrointestinal:  Negative for abdominal pain.  Endocrine: Negative for polydipsia.  Skin:  Negative for  rash.  Neurological:  Negative for dizziness, weakness and headaches.  Hematological:  Does not bruise/bleed easily.  All other systems reviewed and are negative.      Objective:   Physical Exam Vitals and nursing note reviewed.  Constitutional:      General: She is not in acute distress.    Appearance: Normal appearance. She is well-developed.  HENT:     Head: Normocephalic.     Right Ear: Tympanic membrane normal.     Left Ear: Tympanic membrane normal.     Nose: Nose normal.     Mouth/Throat:     Mouth: Mucous membranes are moist.  Eyes:     Pupils: Pupils are  equal, round, and reactive to light.  Neck:     Vascular: No carotid bruit or JVD.  Cardiovascular:     Rate and Rhythm: Normal rate and regular rhythm.     Heart sounds: Normal heart sounds.  Pulmonary:     Effort: Pulmonary effort is normal. No respiratory distress.     Breath sounds: Normal breath sounds. No wheezing or rales.  Chest:     Chest wall: No tenderness.  Abdominal:     General: Bowel sounds are normal. There is no distension or abdominal bruit.     Palpations: Abdomen is soft. There is no hepatomegaly, splenomegaly, mass or pulsatile mass.     Tenderness: There is no abdominal tenderness.  Musculoskeletal:        General: Normal range of motion.     Cervical back: Normal range of motion and neck supple.  Lymphadenopathy:     Cervical: No cervical adenopathy.  Skin:    General: Skin is warm and dry.  Neurological:     Mental Status: She is alert and oriented to person, place, and time.     Deep Tendon Reflexes: Reflexes are normal and symmetric.  Psychiatric:        Behavior: Behavior normal.        Thought Content: Thought content normal.        Judgment: Judgment normal.    BP 119/71   Temp 97.7 F (36.5 C) (Temporal)   Resp 20   Ht '5\' 7"'$  (1.702 m)   Wt 246 lb (111.6 kg)   SpO2 98%   BMI 38.53 kg/m         Assessment & Plan:  HATTYE CASTER comes in today with chief  complaint of Annual Exam   Diagnosis and orders addressed:  1. Annual physical exam  2. Primary hypertension Low sodium diet - losartan-hydrochlorothiazide (HYZAAR) 100-25 MG tablet; Take 1 tablet by mouth daily.  Dispense: 90 tablet; Refill: 1 - CMP14+EGFR - Lipid panel - DG Chest 2 View  3. SVT (supraventricular tachycardia) Avoid caffeine - metoprolol tartrate (LOPRESSOR) 25 MG tablet; Take 0.5 tablets (12.5 mg total) by mouth 2 (two) times daily. (Needs to be seen before next refill)  Dispense: 180 tablet; Refill: 1  4. Gastroesophageal reflux disease without esophagitis Avoid spicy foods Do not eat 2 hours prior to bedtime - omeprazole (PRILOSEC) 20 MG capsule; Take 1 capsule (20 mg total) by mouth daily.  Dispense: 90 capsule; Refill: 1  5. Other iron deficiency anemia Continue daily iron supplement  6. Pernicious anemia Continue b12 injections  7. Recurrent major depressive disorder, in partial remission ([name]) Stress management  8. Morbid obesity ([name]) Discussed diet and exercise for person with BMI >25 Will recheck weight in 3-6 months    Labs pending Health Maintenance reviewed Diet and exercise encouraged  Follow up plan: 6 months   Denise Hassell Done, FNP

## 2022-04-02 ENCOUNTER — Encounter: Payer: Self-pay | Admitting: Nurse Practitioner

## 2022-04-02 ENCOUNTER — Ambulatory Visit (INDEPENDENT_AMBULATORY_CARE_PROVIDER_SITE_OTHER): Payer: PPO | Admitting: Nurse Practitioner

## 2022-04-02 VITALS — BP 119/71 | Temp 97.7°F | Resp 20 | Ht 67.0 in | Wt 246.0 lb

## 2022-04-02 DIAGNOSIS — Z0000 Encounter for general adult medical examination without abnormal findings: Secondary | ICD-10-CM

## 2022-04-02 DIAGNOSIS — D510 Vitamin B12 deficiency anemia due to intrinsic factor deficiency: Secondary | ICD-10-CM

## 2022-04-02 DIAGNOSIS — I10 Essential (primary) hypertension: Secondary | ICD-10-CM | POA: Diagnosis not present

## 2022-04-02 DIAGNOSIS — D508 Other iron deficiency anemias: Secondary | ICD-10-CM | POA: Diagnosis not present

## 2022-04-02 DIAGNOSIS — F3341 Major depressive disorder, recurrent, in partial remission: Secondary | ICD-10-CM | POA: Diagnosis not present

## 2022-04-02 DIAGNOSIS — I471 Supraventricular tachycardia, unspecified: Secondary | ICD-10-CM

## 2022-04-02 DIAGNOSIS — Z0001 Encounter for general adult medical examination with abnormal findings: Secondary | ICD-10-CM | POA: Diagnosis not present

## 2022-04-02 DIAGNOSIS — K219 Gastro-esophageal reflux disease without esophagitis: Secondary | ICD-10-CM | POA: Diagnosis not present

## 2022-04-02 DIAGNOSIS — Z6838 Body mass index (BMI) 38.0-38.9, adult: Secondary | ICD-10-CM | POA: Diagnosis not present

## 2022-04-02 MED ORDER — OMEPRAZOLE 20 MG PO CPDR: 20.0000 mg | DELAYED_RELEASE_CAPSULE | Freq: Every day | ORAL | 1 refills | Status: DC

## 2022-04-02 MED ORDER — METOPROLOL TARTRATE 25 MG PO TABS: 12.5000 mg | ORAL_TABLET | Freq: Two times a day (BID) | ORAL | 1 refills | Status: DC

## 2022-04-02 MED ORDER — LOSARTAN POTASSIUM-HCTZ 100-25 MG PO TABS: 1.0000 | ORAL_TABLET | Freq: Every day | ORAL | 1 refills | Status: DC

## 2022-04-02 NOTE — Addendum Note (Signed)
Addended by: Chevis Pretty on: 04/02/2022 09:01 AM   Modules accepted: Level of Service

## 2022-04-02 NOTE — Patient Instructions (Signed)
Exercising to Stay Healthy To become healthy and stay healthy, it is recommended that you do moderate-intensity and vigorous-intensity exercise. You can tell that you are exercising at a moderate intensity if your heart starts beating faster and you start breathing faster but can still hold a conversation. You can tell that you are exercising at a vigorous intensity if you are breathing much harder and faster and cannot hold a conversation while exercising. How can exercise benefit me? Exercising regularly is important. It has many health benefits, such as: Improving overall fitness, flexibility, and endurance. Increasing bone density. Helping with weight control. Decreasing body fat. Increasing muscle strength and endurance. Reducing stress and tension, anxiety, depression, or anger. Improving overall health. What guidelines should I follow while exercising? Before you start a new exercise program, talk with your health care provider. Do not exercise so much that you hurt yourself, feel dizzy, or get very short of breath. Wear comfortable clothes and wear shoes with good support. Drink plenty of water while you exercise to prevent dehydration or heat stroke. Work out until your breathing and your heartbeat get faster (moderate intensity). How often should I exercise? Choose an activity that you enjoy, and set realistic goals. Your health care provider can help you make an activity plan that is individually designed and works best for you. Exercise regularly as told by your health care provider. This may include: Doing strength training two times a week, such as: Lifting weights. Using resistance bands. Push-ups. Sit-ups. Yoga. Doing a certain intensity of exercise for a given amount of time. Choose from these options: A total of 150 minutes of moderate-intensity exercise every week. A total of 75 minutes of vigorous-intensity exercise every week. A mix of moderate-intensity and  vigorous-intensity exercise every week. Children, pregnant women, people who have not exercised regularly, people who are overweight, and older adults may need to talk with a health care provider about what activities are safe to perform. If you have a medical condition, be sure to talk with your health care provider before you start a new exercise program. What are some exercise ideas? Moderate-intensity exercise ideas include: Walking 1 mile (1.6 km) in about 15 minutes. Biking. Hiking. Golfing. Dancing. Water aerobics. Vigorous-intensity exercise ideas include: Walking 4.5 miles (7.2 km) or more in about 1 hour. Jogging or running 5 miles (8 km) in about 1 hour. Biking 10 miles (16.1 km) or more in about 1 hour. Lap swimming. Roller-skating or in-line skating. Cross-country skiing. Vigorous competitive sports, such as football, basketball, and soccer. Jumping rope. Aerobic dancing. What are some everyday activities that can help me get exercise? Yard work, such as: Pushing a lawn mower. Raking and bagging leaves. Washing your car. Pushing a stroller. Shoveling snow. Gardening. Washing windows or floors. How can I be more active in my day-to-day activities? Use stairs instead of an elevator. Take a walk during your lunch break. If you drive, park your car farther away from your work or school. If you take public transportation, get off one stop early and walk the rest of the way. Stand up or walk around during all of your indoor phone calls. Get up, stretch, and walk around every 30 minutes throughout the day. Enjoy exercise with a friend. Support to continue exercising will help you keep a regular routine of activity. Where to find more information You can find more information about exercising to stay healthy from: U.S. Department of Health and Human Services: www.hhs.gov Centers for Disease Control and Prevention (  CDC): www.cdc.gov Summary Exercising regularly is  important. It will improve your overall fitness, flexibility, and endurance. Regular exercise will also improve your overall health. It can help you control your weight, reduce stress, and improve your bone density. Do not exercise so much that you hurt yourself, feel dizzy, or get very short of breath. Before you start a new exercise program, talk with your health care provider. This information is not intended to replace advice given to you by your health care provider. Make sure you discuss any questions you have with your health care provider. Document Revised: 05/10/2020 Document Reviewed: 05/10/2020 Elsevier Patient Education  2023 Elsevier Inc.  

## 2022-04-16 ENCOUNTER — Telehealth: Payer: Self-pay | Admitting: Nurse Practitioner

## 2022-04-16 NOTE — Telephone Encounter (Signed)
Contacted [name] to schedule their annual wellness visit. Appointment made for 04/29/2022.  Thank you,  Colletta Maryland,  [facility] Direct Dial ??[phone]

## 2022-04-28 NOTE — Patient Instructions (Incomplete)
[name] , Thank you for taking time to come for your Medicare Wellness Visit. I appreciate your ongoing commitment to your health goals. Please review the following plan we discussed and let me know if I can assist you in the future.   These are the goals we discussed:  Goals      Have 3 meals a day     Remain active and independent     Weight < 150 lb (68.04 kg)        This is a list of the screening recommended for you and due dates:  Health Maintenance  Topic Date Due   DTaP/Tdap/Td vaccine (1 - Tdap) Never done   DEXA scan (bone density measurement)  Never done   COVID-19 Vaccine (5 - 2023-24 season) 09/26/2021   Zoster (Shingles) Vaccine (1 of 2) 07/03/2022*   Pneumonia Vaccine (1 of 1 - PCV) 10/03/2022*   Colon Cancer Screening  04/02/2023*   Flu Shot  08/27/2022   Medicare Annual Wellness Visit  04/29/2023   Mammogram  07/15/2023   Hepatitis C Screening: USPSTF Recommendation to screen - Ages 18-79 yo.  Completed   HPV Vaccine  Aged Out  *Topic was postponed. The date shown is not the original due date.    Advanced directives: Forms are available if you choose in the future to pursue completion.  This is recommended in order to make sure that your health wishes are honored in the event that you are unable to verbalize them to the provider.    Conditions/risks identified: Aim for 30 minutes of exercise or brisk walking, 6-8 glasses of water, and 5 servings of fruits and vegetables each day.  Next appointment: Follow up in one year for your annual wellness visit    Preventive Care 65 Years and Older, Female Preventive care refers to lifestyle choices and visits with your health care provider that can promote health and wellness. What does preventive care include? A yearly physical exam. This is also called an annual well check. Dental exams once or twice a year. Routine eye exams. Ask your health care provider how often you should have your eyes checked. Personal  lifestyle choices, including: Daily care of your teeth and gums. Regular physical activity. Eating a healthy diet. Avoiding tobacco and drug use. Limiting alcohol use. Practicing safe sex. Taking low-dose aspirin every day. Taking vitamin and mineral supplements as recommended by your health care provider. What happens during an annual well check? The services and screenings done by your health care provider during your annual well check will depend on your age, overall health, lifestyle risk factors, and family history of disease. Counseling  Your health care provider may ask you questions about your: Alcohol use. Tobacco use. Drug use. Emotional well-being. Home and relationship well-being. Sexual activity. Eating habits. History of falls. Memory and ability to understand (cognition). Work and work Statistician. Reproductive health. Screening  You may have the following tests or measurements: Height, weight, and BMI. Blood pressure. Lipid and cholesterol levels. These may be checked every 5 years, or more frequently if you are over 55 years old. Skin check. Lung cancer screening. You may have this screening every year starting at age 18 if you have a 30-pack-year history of smoking and currently smoke or have quit within the past 15 years. Fecal occult blood test (FOBT) of the stool. You may have this test every year starting at age 52. Flexible sigmoidoscopy or colonoscopy. You may have a sigmoidoscopy every 5 years  or a colonoscopy every 10 years starting at age 56. Hepatitis C blood test. Hepatitis B blood test. Sexually transmitted disease (STD) testing. Diabetes screening. This is done by checking your blood sugar (glucose) after you have not eaten for a while (fasting). You may have this done every 1-3 years. Bone density scan. This is done to screen for osteoporosis. You may have this done starting at age 6. Mammogram. This may be done every 1-2 years. Talk to your  health care provider about how often you should have regular mammograms. Talk with your health care provider about your test results, treatment options, and if necessary, the need for more tests. Vaccines  Your health care provider may recommend certain vaccines, such as: Influenza vaccine. This is recommended every year. Tetanus, diphtheria, and acellular pertussis (Tdap, Td) vaccine. You may need a Td booster every 10 years. Zoster vaccine. You may need this after age 55. Pneumococcal 13-valent conjugate (PCV13) vaccine. One dose is recommended after age 71. Pneumococcal polysaccharide (PPSV23) vaccine. One dose is recommended after age 71. Talk to your health care provider about which screenings and vaccines you need and how often you need them. This information is not intended to replace advice given to you by your health care provider. Make sure you discuss any questions you have with your health care provider. Document Released: 02/08/2015 Document Revised: 10/02/2015 Document Reviewed: 11/13/2014 Elsevier Interactive Patient Education  2017 [place] Prevention in the Home Falls can cause injuries. They can happen to people of all ages. There are many things you can do to make your home safe and to help prevent falls. What can I do on the outside of my home? Regularly fix the edges of walkways and driveways and fix any cracks. Remove anything that might make you trip as you walk through a door, such as a raised step or threshold. Trim any bushes or trees on the path to your home. Use bright outdoor lighting. Clear any walking paths of anything that might make someone trip, such as rocks or tools. Regularly check to see if handrails are loose or broken. Make sure that both sides of any steps have handrails. Any raised decks and porches should have guardrails on the edges. Have any leaves, snow, or ice cleared regularly. Use sand or salt on walking paths during winter. Clean  up any spills in your garage right away. This includes oil or grease spills. What can I do in the bathroom? Use night lights. Install grab bars by the toilet and in the tub and shower. Do not use towel bars as grab bars. Use non-skid mats or decals in the tub or shower. If you need to sit down in the shower, use a plastic, non-slip stool. Keep the floor dry. Clean up any water that spills on the floor as soon as it happens. Remove soap buildup in the tub or shower regularly. Attach bath mats securely with double-sided non-slip rug tape. Do not have throw rugs and other things on the floor that can make you trip. What can I do in the bedroom? Use night lights. Make sure that you have a light by your bed that is easy to reach. Do not use any sheets or blankets that are too big for your bed. They should not hang down onto the floor. Have a firm chair that has side arms. You can use this for support while you get dressed. Do not have throw rugs and other things on the floor  that can make you trip. What can I do in the kitchen? Clean up any spills right away. Avoid walking on wet floors. Keep items that you use a lot in easy-to-reach places. If you need to reach something above you, use a strong step stool that has a grab bar. Keep electrical cords out of the way. Do not use floor polish or wax that makes floors slippery. If you must use wax, use non-skid floor wax. Do not have throw rugs and other things on the floor that can make you trip. What can I do with my stairs? Do not leave any items on the stairs. Make sure that there are handrails on both sides of the stairs and use them. Fix handrails that are broken or loose. Make sure that handrails are as long as the stairways. Check any carpeting to make sure that it is firmly attached to the stairs. Fix any carpet that is loose or worn. Avoid having throw rugs at the top or bottom of the stairs. If you do have throw rugs, attach them to the  floor with carpet tape. Make sure that you have a light switch at the top of the stairs and the bottom of the stairs. If you do not have them, ask someone to add them for you. What else can I do to help prevent falls? Wear shoes that: Do not have high heels. Have rubber bottoms. Are comfortable and fit you well. Are closed at the toe. Do not wear sandals. If you use a stepladder: Make sure that it is fully opened. Do not climb a closed stepladder. Make sure that both sides of the stepladder are locked into place. Ask someone to hold it for you, if possible. Clearly mark and make sure that you can see: Any grab bars or handrails. First and last steps. Where the edge of each step is. Use tools that help you move around (mobility aids) if they are needed. These include: Canes. Walkers. Scooters. Crutches. Turn on the lights when you go into a dark area. Replace any light bulbs as soon as they burn out. Set up your furniture so you have a clear path. Avoid moving your furniture around. If any of your floors are uneven, fix them. If there are any pets around you, be aware of where they are. Review your medicines with your doctor. Some medicines can make you feel dizzy. This can increase your chance of falling. Ask your doctor what other things that you can do to help prevent falls. This information is not intended to replace advice given to you by your health care provider. Make sure you discuss any questions you have with your health care provider. Document Released: 11/08/2008 Document Revised: 06/20/2015 Document Reviewed: 02/16/2014 Elsevier Interactive Patient Education  2017 [employer].

## 2022-04-28 NOTE — Progress Notes (Signed)
Subjective:   [name] is a 72 y.o. female who presents for Medicare Annual (Subsequent) preventive examination.  Review of Systems    ***       Objective:    There were no vitals filed for this visit. There is no height or weight on file to calculate BMI.     12/24/2021    8:14 AM 02/05/2021    9:16 AM 12/31/2020    8:04 AM 12/26/2019   10:38 AM 07/18/2019    8:49 AM 05/31/2019    8:30 AM 01/03/2019    8:24 AM  Advanced Directives  Does Patient Have a Medical Advance Directive? No No No No No No No  Does patient want to make changes to medical advance directive?     No - Patient declined    Would patient like information on creating a medical advance directive? No - Patient declined No - Patient declined No - Patient declined No - Patient declined No - Patient declined No - Patient declined No - Patient declined    Current Medications (verified) Outpatient Encounter Medications as of 04/29/2022  Medication Sig   acetaminophen (TYLENOL) 500 MG tablet Take 500-1,000 mg by mouth every 6 (six) hours as needed for moderate pain.    cyanocobalamin (VITAMIN B12) 1000 MCG/ML injection INJECT 1ML IM EVERY 30 DAYS   diphenhydrAMINE (BENADRYL) 25 mg capsule Take 25 mg by mouth every 6 (six) hours as needed for allergies (bee stings).    losartan-hydrochlorothiazide (HYZAAR) 100-25 MG tablet Take 1 tablet by mouth daily.   metoprolol tartrate (LOPRESSOR) 25 MG tablet Take 0.5 tablets (12.5 mg total) by mouth 2 (two) times daily. (Needs to be seen before next refill)   omeprazole (PRILOSEC) 20 MG capsule Take 1 capsule (20 mg total) by mouth daily.   XARELTO 20 MG TABS tablet Take 1 tablet (20 mg total) by mouth daily with supper.   No facility-administered encounter medications on file as of 04/29/2022.    Allergies (verified) Bee venom, Cephalexin, Dexlansoprazole, Imatinib, Other, Penicillins, Ace inhibitors, Doxycycline hyclate, Latex, Nylon, Sulfa antibiotics, Sulfonamide  derivatives, and Tape   History: Past Medical History:  Diagnosis Date   Allergic urticaria 01/04/2011   Rash from tape.   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  Intolerant to [employer]. Skin Cancer Left hand   History of blood transfusion    Hypertension    Pernicious anemia 07/10/2010   Small bowel mass 01/03/2011   s/p surgery   Ulcer 05/2009   esophageal   Ventricular tachycardia ([place]) 03/26/11   Vitamin B12 deficiency    vit b12 1000 mcg monthly   Past Surgical History:  Procedure Laterality Date   ABDOMINAL HYSTERECTOMY  1989   BALLOON DILATION  12/11/2010   Procedure: BALLOON DILATION;  Surgeon: Rogene Houston, MD;  [facility];  Service: Endoscopy;  Laterality: N/A;   BOWEL RESECTION  01/05/2011   Procedure: SMALL BOWEL RESECTION;  Surgeon: Jamesetta So;  Location: [facility];  Service: General;;  Partial Small Bowel Resection   COLONOSCOPY  02/25/2012   Procedure: COLONOSCOPY;  Surgeon: Bernadene Person  Gloriann Loan, MD;  [facility];  Service: Endoscopy;  Laterality: N/A;  [place]  01/02/2011   Procedure: GIVENS CAPSULE STUDY;  Surgeon: Rogene Houston, MD;  [facility];  Service: Endoscopy;  Laterality: N/A;   LAPAROTOMY  01/05/2011   Procedure: EXPLORATORY LAPAROTOMY;  Surgeon: Jamesetta So;  Location: [facility];  Service: General;  Laterality: N/A;   OPEN REDUCTION INTERNAL FIXATION (ORIF) SCAPHOID WITH DISTAL RADIUS GRAFT Right 02/01/2016   Procedure: Right distal radius open reduction and internal fixation and repair as indicated;  Surgeon: Iran Planas, MD;  Location: [facility];  Service: Orthopedics;  Laterality: Right;   Requests 90 mins   Family History  Problem Relation Age of Onset   Depression Mother    Parkinson's disease Mother    Allergic rhinitis Brother    Angioedema Neg Hx    Asthma Neg Hx    Eczema Neg Hx    Immunodeficiency Neg Hx    Urticaria Neg Hx    Social History   Socioeconomic History   Marital status: Married    Spouse name: Jeneen Rinks   Number of children: 3   Years of education: Not on file   Highest education level: Not on file  Occupational History    Employer: [employer]   Occupation: CNA    Employer: [employer]  Tobacco Use   Smoking status: Never   Smokeless tobacco: Never  Vaping Use   Vaping Use: Never used  Substance and Sexual Activity   Alcohol use: No   Drug use: No   Sexual activity: Not Currently  Other Topics Concern   Not on file  Social History Narrative   Married x 53 years.   2 children living. Youngest son died in [place].   2 granddaughters   Social Determinants of Health   Financial Resource Strain: Low Risk  (02/05/2021)   Overall Financial Resource Strain (CARDIA)    Difficulty of Paying Living Expenses: Not hard at all  Food Insecurity: No Food Insecurity (02/05/2021)   Hunger Vital Sign    Worried About Running Out of Food in the Last Year: Never true    Ran Out of Food in the Last Year: Never true  Transportation Needs: No Transportation Needs (02/05/2021)   PRAPARE - Hydrologist (Medical): No    Lack of Transportation (Non-Medical): No  Physical Activity: Inactive (02/05/2021)   Exercise Vital Sign    Days of Exercise per Week: 0 days    Minutes of Exercise per Session: 0 min  Stress: No Stress Concern Present (02/05/2021)   [place]    Feeling of Stress : Not at all  Social Connections: [place] (02/05/2021)   Social Connection and Isolation Panel [NHANES]    Frequency of Communication with Friends and Family: More than three  times a week    Frequency of Social Gatherings with Friends and Family: More than three times a week    Attends Religious Services: More than 4 times per year    Active Member of [employer] or Organizations: Yes    Attends Music therapist: More than 4 times per year    Marital Status: Married    Tobacco Counseling Counseling given: Not Answered   Clinical Intake:                 Diabetic?No  Activities of Daily Living     No data to display          Patient Care Team: Chevis Pretty, FNP as PCP - General (Family Medicine) Derek Jack, MD as Consulting Physician (Oncology)  Indicate any recent Medical Services you may have received from other than [facility] providers in the past year (date may be approximate).     Assessment:   This is a routine wellness examination for [place].  Hearing/Vision screen No results found.  Dietary issues and exercise activities discussed:     Goals Addressed   None    Depression Screen    04/02/2022    7:57 AM 01/29/2022    9:50 AM 11/25/2021   11:02 AM 10/02/2021    8:25 AM 04/22/2021    3:15 PM 02/05/2021    9:11 AM 11/11/2020    9:33 AM  PHQ 2/9 Scores  PHQ - 2 Score 0 0 0 0 0 1 0  PHQ- 9 Score 0 0 0 0 0  0    Fall Risk    04/02/2022    7:57 AM 01/29/2022    9:50 AM 11/25/2021   11:02 AM 10/02/2021    8:24 AM 04/22/2021    3:15 PM  Fall Risk   Falls in the past year? 0 0 0 1 1  Number falls in past yr:    0 0  Injury with Fall?    1 1  Risk for fall due to :    History of fall(s) History of fall(s)  Follow up    Education provided Falls evaluation completed    Tool:  Any stairs in or around the home? {YES/NO:21197} If so, are there any without handrails? {YES/NO:21197} Home free of loose throw rugs in walkways, pet beds, electrical cords, etc? {YES/NO:21197} Adequate lighting in your home to reduce risk of falls? {YES/NO:21197}  ASSISTIVE  DEVICES UTILIZED TO PREVENT FALLS:  Life alert? {YES/NO:21197} Use of a cane, walker or w/c? {YES/NO:21197} Grab bars in the bathroom? {YES/NO:21197} Shower chair or bench in shower? {YES/NO:21197} Elevated toilet seat or a handicapped toilet? {YES/NO:21197}  TIMED UP AND GO:  Was the test performed? No . Telephonic visit   Cognitive Function:    04/13/2017   10:01 AM 11/19/2014    9:51 AM  MMSE - Mini Mental State Exam  Orientation to time 5 5  Orientation to Place 5 5  Registration 3 3  Attention/ Calculation 5 5  Recall 3 3  Language- name 2 objects 2 2  Language- repeat 1 1  Language- follow 3 step command 3 3  Language- read & follow direction 1 1  Write a sentence 1 1  Copy design 1 1  Total score 30 30        05/31/2019    8:32 AM  6CIT Screen  What Year? 0 points  What month? 0 points  What time? 0 points  Count back from 20 0 points  Months in reverse 0 points  Repeat phrase 0 points  Total Score 0 points    Immunizations Immunization History  Administered Date(s) Administered   Fluad Quad(high Dose 65+) 10/25/2019   Influenza, High Dose Seasonal PF 11/21/2018, 11/18/2020   Influenza-Unspecified 11/28/2019   Moderna Sars-Covid-2 Vaccination 02/13/2019, 03/13/2019, 12/05/2019   PFIZER Comirnaty(Gray Top)Covid-19 Tri-Sucrose Vaccine 11/28/2019    TDAP status: Due, Education has been provided regarding the importance of this vaccine. Advised may receive this vaccine at local pharmacy  or Health Dept. Aware to provide a copy of the vaccination record if obtained from local pharmacy or Health Dept. Verbalized acceptance and understanding.  {Flu Vaccine status:2101806}  {Pneumococcal vaccine status:2101807}  {Covid-19 vaccine status:2101808}  Qualifies for Shingles Vaccine? Yes   Zostavax completed No   Shingrix Completed?: No.    Education has been provided regarding the importance of this vaccine. Patient has been advised to call insurance company to  determine out of pocket expense if they have not yet received this vaccine. Advised may also receive vaccine at local pharmacy or Health Dept. Verbalized acceptance and understanding.  Screening Tests Health Maintenance  Topic Date Due   DTaP/Tdap/Td (1 - Tdap) Never done   DEXA SCAN  Never done   COVID-19 Vaccine (5 - 2023-24 season) 09/26/2021   Medicare Annual Wellness (AWV)  02/05/2022   Zoster Vaccines- Shingrix (1 of 2) 07/03/2022 (Originally 04/16/1969)   Pneumonia Vaccine 65+ Years old (1 of 1 - PCV) 10/03/2022 (Originally 04/15/2016)   COLONOSCOPY (Pts 45-5yrs Insurance coverage will need to be confirmed)  04/02/2023 (Originally 02/24/2022)   INFLUENZA VACCINE  08/27/2022   MAMMOGRAM  07/15/2023   Hepatitis C Screening  Completed   HPV VACCINES  Aged Out    Health Maintenance  Health Maintenance Due  Topic Date Due   DTaP/Tdap/Td (1 - Tdap) Never done   DEXA SCAN  Never done   COVID-19 Vaccine (5 - 2023-24 season) 09/26/2021   Medicare Annual Wellness (AWV)  02/05/2022    Colorectal cancer screening: Type of screening: Colonoscopy. Completed 02/25/12. Repeat every 10 years  Mammogram status: Completed 07/14/21. Repeat every year  {Bone Density status:21018021}  Lung Cancer Screening: (Low Dose CT Chest recommended if Age 17-80 years, 30 pack-year currently smoking OR have quit w/in 15years.) does not qualify.   Lung Cancer Screening Referral: n/a  Additional Screening:  Hepatitis C Screening: does qualify; Completed 11/19/14  Vision Screening: Recommended annual ophthalmology exams for early detection of glaucoma and other disorders of the eye. Is the patient up to date with their annual eye exam?  {YES/NO:21197} Who is the provider or what is the name of the office in which the patient attends annual eye exams? *** If pt is not established with a provider, would they like to be referred to a provider to establish care? {YES/NO:21197}.   Dental Screening:  Recommended annual dental exams for proper oral hygiene  Community Resource Referral / Chronic Care Management: CRR required this visit?  {YES/NO:21197}  CCM required this visit?  {YES/NO:21197}     Plan:     I have personally reviewed and noted the following in the patient's chart:   Medical and social history Use of alcohol, tobacco or illicit drugs  Current medications and supplements including opioid prescriptions. {Opioid Prescriptions:[phone]} Functional ability and status Nutritional status Physical activity Advanced directives List of other physicians Hospitalizations, surgeries, and ER visits in previous 12 months Vitals Screenings to include cognitive, depression, and falls Referrals and appointments  In addition, I have reviewed and discussed with patient certain preventive protocols, quality metrics, and best practice recommendations. A written personalized care plan for preventive services as well as general preventive health recommendations were provided to patient.     Vanetta Mulders, [place]   [id number]   Due to this being a virtual visit, the after visit summary with patients personalized plan was offered to patient via mail or my-chart. ***Patient declined at this time./ Patient would like to access on my-chart/ per request,  patient was mailed a copy of AVS./ Patient preferred to pick up at office at next visit  Nurse Notes: ***

## 2022-04-29 ENCOUNTER — Ambulatory Visit (INDEPENDENT_AMBULATORY_CARE_PROVIDER_SITE_OTHER): Payer: PPO

## 2022-04-29 ENCOUNTER — Telehealth: Payer: Self-pay

## 2022-04-29 VITALS — Ht 67.0 in | Wt 246.0 lb

## 2022-04-29 DIAGNOSIS — Z0000 Encounter for general adult medical examination without abnormal findings: Secondary | ICD-10-CM

## 2022-04-29 NOTE — Telephone Encounter (Signed)
Patient seen for AWV and would like to know if there are any weight loss supplements that would be safe for her to take.

## 2022-04-30 NOTE — Telephone Encounter (Signed)
Not that her insurance will cover

## 2022-04-30 NOTE — Telephone Encounter (Signed)
Either of those are very safe to try

## 2022-04-30 NOTE — Telephone Encounter (Signed)
Patient aware.  What she was wondering when she spoke with the nurse for her AWV was if [place] or [place] apple cider vinegar was safe for her with her health conditions and also her medications.  Please advise.

## 2022-04-30 NOTE — Telephone Encounter (Signed)
Lmtcb.

## 2022-05-01 NOTE — Telephone Encounter (Signed)
Pt has been notified.

## 2022-07-13 ENCOUNTER — Other Ambulatory Visit (HOSPITAL_COMMUNITY): Payer: Self-pay | Admitting: Hematology

## 2022-07-13 DIAGNOSIS — Z1231 Encounter for screening mammogram for malignant neoplasm of breast: Secondary | ICD-10-CM

## 2022-07-20 ENCOUNTER — Other Ambulatory Visit: Payer: Self-pay | Admitting: Nurse Practitioner

## 2022-07-20 DIAGNOSIS — I10 Essential (primary) hypertension: Secondary | ICD-10-CM

## 2022-07-20 DIAGNOSIS — K219 Gastro-esophageal reflux disease without esophagitis: Secondary | ICD-10-CM

## 2022-07-22 ENCOUNTER — Ambulatory Visit (HOSPITAL_COMMUNITY)
Admission: RE | Admit: 2022-07-22 | Discharge: 2022-07-22 | Disposition: A | Discharge status: Home / Self Care | Payer: PPO | Source: Ambulatory Visit | Attending: Hematology | Admitting: Hematology

## 2022-07-22 DIAGNOSIS — Z1231 Encounter for screening mammogram for malignant neoplasm of breast: Secondary | ICD-10-CM | POA: Diagnosis not present

## 2022-08-20 ENCOUNTER — Other Ambulatory Visit: Payer: Self-pay | Admitting: Nurse Practitioner

## 2022-08-20 DIAGNOSIS — I471 Supraventricular tachycardia, unspecified: Secondary | ICD-10-CM

## 2022-09-06 NOTE — Progress Notes (Signed)
Pharmacy Quality Measure Review  This patient is appearing on a report for being at risk of failing the adherence measure for hypertension (ACEi/ARB) medications this calendar year.   Medication: losartan-hydrochlorothiazide 100-25 mg Last fill date: 07/20/22 for 90 day supply (No refills remaining, PCP f/u scheduled 10/05/22)  Most recent in-office BP controlled. Insurance report was not up to date. No action needed at this time.   Nils Pyle, PharmD PGY1 Pharmacy Resident

## 2022-09-18 ENCOUNTER — Other Ambulatory Visit: Payer: Self-pay | Admitting: Nurse Practitioner

## 2022-09-18 DIAGNOSIS — I471 Supraventricular tachycardia, unspecified: Secondary | ICD-10-CM

## 2022-10-05 ENCOUNTER — Ambulatory Visit (INDEPENDENT_AMBULATORY_CARE_PROVIDER_SITE_OTHER): Payer: PPO | Admitting: Nurse Practitioner

## 2022-10-05 ENCOUNTER — Other Ambulatory Visit: Payer: Self-pay | Admitting: Nurse Practitioner

## 2022-10-05 ENCOUNTER — Encounter: Payer: Self-pay | Admitting: Nurse Practitioner

## 2022-10-05 ENCOUNTER — Ambulatory Visit (INDEPENDENT_AMBULATORY_CARE_PROVIDER_SITE_OTHER): Payer: PPO

## 2022-10-05 VITALS — BP 108/69 | HR 55 | Temp 97.8°F | Resp 20 | Ht 67.0 in | Wt 237.0 lb

## 2022-10-05 DIAGNOSIS — Z7901 Long term (current) use of anticoagulants: Secondary | ICD-10-CM

## 2022-10-05 DIAGNOSIS — I82522 Chronic embolism and thrombosis of left iliac vein: Secondary | ICD-10-CM

## 2022-10-05 DIAGNOSIS — F3341 Major depressive disorder, recurrent, in partial remission: Secondary | ICD-10-CM | POA: Diagnosis not present

## 2022-10-05 DIAGNOSIS — Z780 Asymptomatic menopausal state: Secondary | ICD-10-CM | POA: Diagnosis not present

## 2022-10-05 DIAGNOSIS — I10 Essential (primary) hypertension: Secondary | ICD-10-CM

## 2022-10-05 DIAGNOSIS — K219 Gastro-esophageal reflux disease without esophagitis: Secondary | ICD-10-CM

## 2022-10-05 DIAGNOSIS — D510 Vitamin B12 deficiency anemia due to intrinsic factor deficiency: Secondary | ICD-10-CM

## 2022-10-05 DIAGNOSIS — I471 Supraventricular tachycardia, unspecified: Secondary | ICD-10-CM | POA: Diagnosis not present

## 2022-10-05 DIAGNOSIS — D508 Other iron deficiency anemias: Secondary | ICD-10-CM

## 2022-10-05 MED ORDER — METOPROLOL TARTRATE 25 MG PO TABS
12.5000 mg | ORAL_TABLET | Freq: Two times a day (BID) | ORAL | 1 refills | Status: DC
Start: 2022-10-05 — End: 2023-04-06

## 2022-10-05 MED ORDER — RIVAROXABAN 20 MG PO TABS
20.0000 mg | ORAL_TABLET | Freq: Every day | ORAL | 5 refills | Status: DC
Start: 2022-10-05 — End: 2023-04-06

## 2022-10-05 MED ORDER — OMEPRAZOLE 20 MG PO CPDR
20.0000 mg | DELAYED_RELEASE_CAPSULE | Freq: Every day | ORAL | 1 refills | Status: DC
Start: 2022-10-05 — End: 2023-04-06

## 2022-10-05 MED ORDER — LOSARTAN POTASSIUM-HCTZ 100-25 MG PO TABS
1.0000 | ORAL_TABLET | Freq: Every day | ORAL | 1 refills | Status: DC
Start: 2022-10-05 — End: 2023-04-06

## 2022-10-05 MED ORDER — CYANOCOBALAMIN 1000 MCG/ML IJ SOLN: INTRAMUSCULAR | 11 refills | Status: DC

## 2022-10-05 NOTE — Patient Instructions (Signed)
Exercising to Stay Healthy To become healthy and stay healthy, it is recommended that you do moderate-intensity and vigorous-intensity exercise. You can tell that you are exercising at a moderate intensity if your heart starts beating faster and you start breathing faster but can still hold a conversation. You can tell that you are exercising at a vigorous intensity if you are breathing much harder and faster and cannot hold a conversation while exercising. How can exercise benefit me? Exercising regularly is important. It has many health benefits, such as: Improving overall fitness, flexibility, and endurance. Increasing bone density. Helping with weight control. Decreasing body fat. Increasing muscle strength and endurance. Reducing stress and tension, anxiety, depression, or anger. Improving overall health. What guidelines should I follow while exercising? Before you start a new exercise program, talk with your health care provider. Do not exercise so much that you hurt yourself, feel dizzy, or get very short of breath. Wear comfortable clothes and wear shoes with good support. Drink plenty of water while you exercise to prevent dehydration or heat stroke. Work out until your breathing and your heartbeat get faster (moderate intensity). How often should I exercise? Choose an activity that you enjoy, and set realistic goals. Your health care provider can help you make an activity plan that is individually designed and works best for you. Exercise regularly as told by your health care provider. This may include: Doing strength training two times a week, such as: Lifting weights. Using resistance bands. Push-ups. Sit-ups. Yoga. Doing a certain intensity of exercise for a given amount of time. Choose from these options: A total of 150 minutes of moderate-intensity exercise every week. A total of 75 minutes of vigorous-intensity exercise every week. A mix of moderate-intensity and  vigorous-intensity exercise every week. Children, pregnant women, people who have not exercised regularly, people who are overweight, and older adults may need to talk with a health care provider about what activities are safe to perform. If you have a medical condition, be sure to talk with your health care provider before you start a new exercise program. What are some exercise ideas? Moderate-intensity exercise ideas include: Walking 1 mile (1.6 km) in about 15 minutes. Biking. Hiking. Golfing. Dancing. Water aerobics. Vigorous-intensity exercise ideas include: Walking 4.5 miles (7.2 km) or more in about 1 hour. Jogging or running 5 miles (8 km) in about 1 hour. Biking 10 miles (16.1 km) or more in about 1 hour. Lap swimming. Roller-skating or in-line skating. Cross-country skiing. Vigorous competitive sports, such as football, basketball, and soccer. Jumping rope. Aerobic dancing. What are some everyday activities that can help me get exercise? Yard work, such as: Pushing a lawn mower. Raking and bagging leaves. Washing your car. Pushing a stroller. Shoveling snow. Gardening. Washing windows or floors. How can I be more active in my day-to-day activities? Use stairs instead of an elevator. Take a walk during your lunch break. If you drive, park your car farther away from your work or school. If you take public transportation, get off one stop early and walk the rest of the way. Stand up or walk around during all of your indoor phone calls. Get up, stretch, and walk around every 30 minutes throughout the day. Enjoy exercise with a friend. Support to continue exercising will help you keep a regular routine of activity. Where to find more information You can find more information about exercising to stay healthy from: U.S. Department of Health and Human Services: www.hhs.gov Centers for Disease Control and Prevention (  CDC): www.cdc.gov Summary Exercising regularly is  important. It will improve your overall fitness, flexibility, and endurance. Regular exercise will also improve your overall health. It can help you control your weight, reduce stress, and improve your bone density. Do not exercise so much that you hurt yourself, feel dizzy, or get very short of breath. Before you start a new exercise program, talk with your health care provider. This information is not intended to replace advice given to you by your health care provider. Make sure you discuss any questions you have with your health care provider. Document Revised: 05/10/2020 Document Reviewed: 05/10/2020 Elsevier Patient Education  2024 [employer]  

## 2022-10-05 NOTE — Progress Notes (Signed)
Subjective:    Patient ID: [name], female    DOB: 29-Jul-1950, 72 y.o.   MRN: [id number]   Chief Complaint: medical management of chronic issues     HPI:  [name] is a 72 y.o. who identifies as a female who was assigned female at birth.   Social history: Lives with: husband Work history: retired   Water engineer in today for follow up of the following chronic medical issues:  1. Primary hypertension No c/o chest pain, sob or headache. Does not check blood pressure at home. BP Readings from Last 3 Encounters:  04/02/22 119/71  01/29/22 115/73  12/24/21 131/83     2. SVT (supraventricular tachycardia) No c/o of palpitations or heart racing  3. Chronic deep vein thrombosis (DVT) of iliac vein of left lower extremity ([name]) 4. Chronic anticoagulation Is on xeralto without any bleeding issues  5. Gastroesophageal reflux disease without esophagitis Is on omeprazole daily to prevent symptoms  6. Other iron deficiency anemia Lab Results  Component Value Date   HGB 13.7 12/15/2021     7. Recurrent major depressive disorder, in partial remission ([name]) Is not on antidepressant . Says she is currently doing well.    10/05/2022    8:09 AM 04/29/2022   10:55 AM 04/02/2022    7:57 AM  Depression screen PHQ 2/9  Decreased Interest 0 0 0  Down, Depressed, Hopeless 0 0 0  PHQ - 2 Score 0 0 0  Altered sleeping 0 0 0  Tired, decreased energy 0 0 0  Change in appetite 0 0 0  Feeling bad or failure about yourself  0 0 0  Trouble concentrating 0 0 0  Moving slowly or fidgety/restless 0 0 0  Suicidal thoughts 0 0 0  PHQ-9 Score 0 0 0  Difficult doing work/chores Not difficult at all Not difficult at all Not difficult at all     8. Pernicious anemia Is on monthy b12 injections. Lab Results  Component Value Date   VITAMINB12 519 12/15/2021     9. Morbid obesity ([name]) Weight is down 9lbs Wt Readings from Last 3 Encounters:  10/05/22 237 lb (107.5 kg)  04/29/22  246 lb (111.6 kg)  04/02/22 246 lb (111.6 kg)   BMI Readings from Last 3 Encounters:  10/05/22 37.12 kg/m  04/29/22 38.53 kg/m  04/02/22 38.53 kg/m      New complaints: None today  Allergies  Allergen Reactions   Bee Venom Swelling and Rash    SWELLING REACTION UNSPECIFIED  SWELLING REACTION UNSPECIFIED    Cephalexin Swelling    PATIENT WITH Rx OF ANAPHYLAXIS TO PCN's SWELLING REACTION UNSPECIFIED  PATIENT WITH Rx OF ANAPHYLAXIS TO PCN's SWELLING REACTION UNSPECIFIED    Dexlansoprazole Swelling    SWELLING REACTION UNSPECIFIED  SWELLING REACTION UNSPECIFIED    Imatinib Other (See Comments)    Cardiac dysrhythmia   Other Swelling    PECANS MOUTH SWELLS   Penicillins Anaphylaxis    Has patient had a PCN reaction causing immediate rash, facial/tongue/throat swelling, SOB or lightheadedness with hypotension: No no Has patient had a PCN reaction causing severe rash involving mucus membranes or skin necrosis: No Has patient had a PCN reaction that required hospitalization No Has patient had a PCN reaction occurring within the last 10 years: No If all of the above answers are "NO", then may proceed with Cephalosporin use.    Ace Inhibitors Other (See Comments)    angioedema   Doxycycline Hyclate  Latex Itching   Nylon Rash   Sulfa Antibiotics Rash   Sulfonamide Derivatives Rash   Tape Itching    Paper tape is ok Paper tape is ok   Outpatient Encounter Medications as of 10/05/2022  Medication Sig   acetaminophen (TYLENOL) 500 MG tablet Take 500-1,000 mg by mouth every 6 (six) hours as needed for moderate pain.    cyanocobalamin (VITAMIN B12) 1000 MCG/ML injection INJECT IM EVERY 30 DAYS   diphenhydrAMINE (BENADRYL) 25 mg capsule Take 25 mg by mouth every 6 (six) hours as needed for allergies (bee stings).    losartan-hydrochlorothiazide (HYZAAR) 100-25 MG tablet TAKE ONE (1) TABLET BY MOUTH EVERY DAY   metoprolol tartrate (LOPRESSOR) 25 MG tablet Take 0.5  tablets (12.5 mg total) by mouth 2 (two) times daily. (Needs to be seen before next refill)   omeprazole (PRILOSEC) 20 MG capsule TAKE ONE CAPSULE BY MOUTH DAILY   XARELTO 20 MG TABS tablet TAKE 1 TABLET BY MOUTH DAILY WITH SUPPER   No facility-administered encounter medications on file as of 10/05/2022.    Past Surgical History:  Procedure Laterality Date   ABDOMINAL HYSTERECTOMY  1989   BALLOON DILATION  12/11/2010   Procedure: BALLOON DILATION;  Surgeon: Malissa Hippo, MD;  Location: [facility];  Service: Endoscopy;  Laterality: N/A;   BOWEL RESECTION  01/05/2011   Procedure: SMALL BOWEL RESECTION;  Surgeon: Dalia Heading;  Location: [facility];  Service: General;;  Partial Small Bowel Resection   COLONOSCOPY  02/25/2012   Procedure: COLONOSCOPY;  Surgeon: Malissa Hippo, MD;  Location: [facility];  Service: Endoscopy;  Laterality: N/A;  1200   GIVENS CAPSULE STUDY  01/02/2011   Procedure: GIVENS CAPSULE STUDY;  Surgeon: Malissa Hippo, MD;  Location: [facility];  Service: Endoscopy;  Laterality: N/A;   LAPAROTOMY  01/05/2011   Procedure: EXPLORATORY LAPAROTOMY;  Surgeon: Dalia Heading;  Location: [facility];  Service: General;  Laterality: N/A;   OPEN REDUCTION INTERNAL FIXATION (ORIF) SCAPHOID WITH DISTAL RADIUS GRAFT Right 02/01/2016   Procedure: Right distal radius open reduction and internal fixation and repair as indicated;  Surgeon: Bradly Bienenstock, MD;  Location: [facility];  Service: Orthopedics;  Laterality: Right;  Requests 90 mins    Family History  Problem Relation Age of Onset   Depression Mother    Parkinson's disease Mother    Allergic rhinitis Brother    Angioedema Neg Hx    Asthma Neg Hx    Eczema Neg Hx    Immunodeficiency Neg Hx    Urticaria Neg Hx       Controlled substance contract: n/a     Review of Systems  Constitutional:  Negative for diaphoresis.  Eyes:  Negative for pain.  Respiratory:  Negative for shortness of breath.   Cardiovascular:   Negative for chest pain, palpitations and leg swelling.  Gastrointestinal:  Negative for abdominal pain.  Endocrine: Negative for polydipsia.  Skin:  Negative for rash.  Neurological:  Negative for dizziness, weakness and headaches.  Hematological:  Does not bruise/bleed easily.  All other systems reviewed and are negative.      Objective:   Physical Exam Vitals and nursing note reviewed.  Constitutional:      General: She is not in acute distress.    Appearance: Normal appearance. She is well-developed.  HENT:     Head: Normocephalic.     Right Ear: Tympanic membrane normal.     Left Ear: Tympanic membrane normal.  Nose: Nose normal.     Mouth/Throat:     Mouth: Mucous membranes are moist.  Eyes:     Pupils: Pupils are equal, round, and reactive to light.  Neck:     Vascular: No carotid bruit or JVD.  Cardiovascular:     Rate and Rhythm: Normal rate and regular rhythm.     Heart sounds: Normal heart sounds.  Pulmonary:     Effort: Pulmonary effort is normal. No respiratory distress.     Breath sounds: Normal breath sounds. No wheezing or rales.  Chest:     Chest wall: No tenderness.  Abdominal:     General: Bowel sounds are normal. There is no distension or abdominal bruit.     Palpations: Abdomen is soft. There is no hepatomegaly, splenomegaly, mass or pulsatile mass.     Tenderness: There is no abdominal tenderness.  Musculoskeletal:        General: Normal range of motion.     Cervical back: Normal range of motion and neck supple.  Lymphadenopathy:     Cervical: No cervical adenopathy.  Skin:    General: Skin is warm and dry.  Neurological:     Mental Status: She is alert and oriented to person, place, and time.     Deep Tendon Reflexes: Reflexes are normal and symmetric.  Psychiatric:        Behavior: Behavior normal.        Thought Content: Thought content normal.        Judgment: Judgment normal.    BP 108/69   Pulse (!) 55   Temp 97.8 F (36.6 C)  (Temporal)   Resp 20   Ht 5\' 7"  (1.702 m)   Wt 237 lb (107.5 kg)   SpO2 96%   BMI 37.12 kg/m         Assessment & Plan:   [name] comes in today with chief complaint of Medical Management of Chronic Issues   Diagnosis and orders addressed:  1. Primary hypertension Low sodium diet - losartan-hydrochlorothiazide (HYZAAR) 100-25 MG tablet; Take 1 tablet by mouth daily.  Dispense: 90 tablet; Refill: 1 - CBC with Differential/Platelet - CMP14+EGFR - Lipid panel  2. SVT (supraventricular tachycardia) Avoid caffeine - metoprolol tartrate (LOPRESSOR) 25 MG tablet; Take 0.5 tablets (12.5 mg total) by mouth 2 (two) times daily. (Needs to be seen before next refill)  Dispense: 180 tablet; Refill: 1 - rivaroxaban (XARELTO) 20 MG TABS tablet; Take 1 tablet (20 mg total) by mouth daily with supper.  Dispense: 30 tablet; Refill: 5  3. Chronic deep vein thrombosis (DVT) of iliac vein of left lower extremity ([name]) Report any bleeding issue with xeralto  4. Chronic anticoagulation  5. Gastroesophageal reflux disease without esophagitis Avoid spicy foods Do not eat 2 hours prior to bedtime - omeprazole (PRILOSEC) 20 MG capsule; Take 1 capsule (20 mg total) by mouth daily.  Dispense: 90 capsule; Refill: 1  6. Other iron deficiency anemia Labs pending  7. Recurrent major depressive disorder, in partial remission ([name]) Stress management  8. Pernicious anemia - cyanocobalamin (VITAMIN B12) 1000 MCG/ML injection; INJECT IM EVERY 30 DAYS  Dispense: 1 mL; Refill: 11  9. Morbid obesity ([name]) Discussed diet and exercise for person with BMI >25 Will recheck weight in 3-6 months    Labs pending Health Maintenance reviewed Diet and exercise encouraged  Follow up plan: 6 months   [name] Daphine Deutscher, FNP

## 2022-10-06 DIAGNOSIS — M8589 Other specified disorders of bone density and structure, multiple sites: Secondary | ICD-10-CM | POA: Diagnosis not present

## 2022-10-06 DIAGNOSIS — Z780 Asymptomatic menopausal state: Secondary | ICD-10-CM | POA: Diagnosis not present

## 2022-10-06 DIAGNOSIS — B353 Tinea pedis: Secondary | ICD-10-CM | POA: Diagnosis not present

## 2022-10-06 LAB — CMP14+EGFR
ALT: 20 IU/L (ref 0–32)
AST: 22 IU/L (ref 0–40)
Albumin: 4.2 g/dL (ref 3.8–4.8)
Alkaline Phosphatase: 85 IU/L (ref 44–121)
BUN/Creatinine Ratio: 18 (ref 12–28)
BUN: 17 mg/dL (ref 8–27)
Bilirubin Total: 0.5 mg/dL (ref 0.0–1.2)
CO2: 26 mmol/L (ref 20–29)
Calcium: 9.5 mg/dL (ref 8.7–10.3)
Chloride: 101 mmol/L (ref 96–106)
Creatinine, Ser: 0.94 mg/dL (ref 0.57–1.00)
Globulin, Total: 2.9 g/dL (ref 1.5–4.5)
Glucose: 104 mg/dL — ABNORMAL HIGH (ref 70–99)
Potassium: 4.1 mmol/L (ref 3.5–5.2)
Sodium: 140 mmol/L (ref 134–144)
Total Protein: 7.1 g/dL (ref 6.0–8.5)
eGFR: 65 mL/min/{1.73_m2} (ref >59)

## 2022-10-06 LAB — CBC WITH DIFFERENTIAL/PLATELET
Basophils Absolute: 0 10*3/uL (ref 0.0–0.2)
Basos: 1 %
EOS (ABSOLUTE): 0.1 10*3/uL (ref 0.0–0.4)
Eos: 1 %
Hematocrit: 43 % (ref 34.0–46.6)
Hemoglobin: 14.1 g/dL (ref 11.1–15.9)
Immature Grans (Abs): 0 10*3/uL (ref 0.0–0.1)
Immature Granulocytes: 0 %
Lymphocytes Absolute: 1.9 10*3/uL (ref 0.7–3.1)
Lymphs: 34 %
MCH: 29.1 pg (ref 26.6–33.0)
MCHC: 32.8 g/dL (ref 31.5–35.7)
MCV: 89 fL (ref 79–97)
Monocytes Absolute: 0.7 10*3/uL (ref 0.1–0.9)
Monocytes: 12 %
Neutrophils Absolute: 2.8 10*3/uL (ref 1.4–7.0)
Neutrophils: 52 %
Platelets: 320 10*3/uL (ref 150–450)
RBC: 4.85 x10E6/uL (ref 3.77–5.28)
RDW: 13.1 % (ref 11.7–15.4)
WBC: 5.5 10*3/uL (ref 3.4–10.8)

## 2022-10-06 LAB — LIPID PANEL
Chol/HDL Ratio: 4.2 ratio (ref 0.0–4.4)
Cholesterol, Total: 196 mg/dL (ref 100–199)
HDL: 47 mg/dL (ref >39)
LDL Chol Calc (NIH): 128 mg/dL — ABNORMAL HIGH (ref 0–99)
Triglycerides: 114 mg/dL (ref 0–149)
VLDL Cholesterol Cal: 21 mg/dL (ref 5–40)

## 2022-10-08 ENCOUNTER — Telehealth: Payer: Self-pay | Admitting: Nurse Practitioner

## 2022-10-09 ENCOUNTER — Telehealth: Payer: Self-pay | Admitting: Nurse Practitioner

## 2022-10-09 NOTE — Telephone Encounter (Signed)
Left vm for cb

## 2022-10-09 NOTE — Telephone Encounter (Signed)
See other encounter.

## 2022-10-12 NOTE — Telephone Encounter (Signed)
Patient calling back to speak to PCP nurse. Please call back.

## 2022-10-12 NOTE — Telephone Encounter (Signed)
Left message for patient to call back  

## 2022-10-15 ENCOUNTER — Other Ambulatory Visit: Payer: Self-pay

## 2022-10-15 DIAGNOSIS — E7800 Pure hypercholesterolemia, unspecified: Secondary | ICD-10-CM

## 2022-10-15 NOTE — Telephone Encounter (Signed)
Patient called office back stating that she would like to go ahead and meet with the pharmacist about her cholesterol. Referral placed and patient notified

## 2022-10-19 ENCOUNTER — Telehealth: Payer: Self-pay

## 2022-10-19 DIAGNOSIS — C44719 Basal cell carcinoma of skin of left lower limb, including hip: Secondary | ICD-10-CM | POA: Diagnosis not present

## 2022-10-19 NOTE — Progress Notes (Unsigned)
Care Guide Note  10/19/2022 Name: AZIYA GEITNER MRN: [id number] DOB: 06-29-1950  Referred by: Bennie Pierini, FNP Reason for referral : Care Coordination (Outreach to schedule with Pharm d )   TYA LISANTI is a 72 y.o. year old female who is a primary care patient of Bennie Pierini, FNP. Ladona Mow was referred to the pharmacist for assistance related to  cholesterol  .    An unsuccessful telephone outreach was attempted today to contact the patient who was referred to the pharmacy team for assistance with medication management. Additional attempts will be made to contact the patient.   Penne Lash, RMA Care Guide [facility]  [place], [place] [address] Direct Dial: [phone] [email or web address]

## 2022-10-20 DIAGNOSIS — C44719 Basal cell carcinoma of skin of left lower limb, including hip: Secondary | ICD-10-CM | POA: Diagnosis not present

## 2022-10-22 NOTE — Progress Notes (Signed)
Care Guide Note  10/22/2022 Name: [name] MRN: [id number] DOB: 06/08/1950  Referred by: Bennie Pierini, FNP Reason for referral : Care Coordination (Outreach to schedule with Pharm d )   [name] is a 72 y.o. year old female who is a primary care patient of Bennie Pierini, FNP. [name] was referred to the pharmacist for assistance related to HLD.    Successful contact was made with the patient to discuss pharmacy services including being ready for the pharmacist to call at least 5 minutes before the scheduled appointment time, to have medication bottles and any blood sugar or blood pressure readings ready for review. The patient agreed to meet with the pharmacist via with the pharmacist via telephone visit on (date/time).  11/20/2022  Penne Lash, RMA Care Guide [facility]  [place], [place] [address] Direct Dial: [phone] [name]

## 2022-11-09 DIAGNOSIS — H40033 Anatomical narrow angle, bilateral: Secondary | ICD-10-CM | POA: Diagnosis not present

## 2022-11-09 DIAGNOSIS — H2513 Age-related nuclear cataract, bilateral: Secondary | ICD-10-CM | POA: Diagnosis not present

## 2022-11-11 DIAGNOSIS — C44719 Basal cell carcinoma of skin of left lower limb, including hip: Secondary | ICD-10-CM | POA: Diagnosis not present

## 2022-11-20 ENCOUNTER — Other Ambulatory Visit: Payer: PPO

## 2022-11-25 ENCOUNTER — Telehealth: Payer: Self-pay

## 2022-11-25 NOTE — Progress Notes (Signed)
Care Guide Note  11/25/2022 Name: KAYTEN CHEAK MRN: [id number] DOB: 15-Dec-1950  Referred by: Bennie Pierini, FNP Reason for referral : Care Coordination (Outreach to reschedule with Pharm d )   DEZYRE HAGIN is a 72 y.o. year old female who is a primary care patient of Bennie Pierini, FNP. Ladona Mow was referred to the pharmacist for assistance related to HLD.    An unsuccessful telephone outreach was attempted today to contact the patient who was referred to the pharmacy team for assistance with medication management. Additional attempts will be made to contact the patient.   Penne Lash, RMA Care Guide [facility]  [place], [place] [address] Direct Dial: [phone] [email or web address]

## 2022-12-03 NOTE — Progress Notes (Signed)
   Care Guide Note  12/03/2022 Name: [name] MRN: [id number] DOB: [birth year]  Referred by: Bennie Pierini, FNP Reason for referral : Care Coordination (Outreach to reschedule with Pharm d )   [name] is a 72 y.o. year old female who is a primary care patient of Bennie Pierini, FNP. Ladona Mow was referred to the pharmacist for assistance related to HLD.    A second unsuccessful telephone outreach was attempted today to contact the patient who was referred to the pharmacy team for assistance with medication assistance. Additional attempts will be made to contact the patient.  Penne Lash, RMA Care Guide [facility]  [place], [place] [address] Direct Dial: [phone] [email or web address]

## 2022-12-08 NOTE — Progress Notes (Signed)
   Care Guide Note  12/08/2022 Name: ZYANAH BRIGHTWELL MRN: [id number] DOB: 1950/02/20  Referred by: Bennie Pierini, FNP Reason for referral : Care Coordination (Outreach to reschedule with Pharm d )   DARL FULLERTON is a 72 y.o. year old female who is a primary care patient of Bennie Pierini, FNP. Ladona Mow was referred to the pharmacist for assistance related to HLD.    A third unsuccessful telephone outreach was attempted today to contact the patient who was referred to the pharmacy team for assistance with medication management. The Population Health team is pleased to engage with this patient at any time in the future upon receipt of referral and should he/she be interested in assistance from the [facility] team.   Penne Lash, RMA Care Guide [facility]  [place], [place] [address] Direct Dial: [phone] [email or web address]

## 2022-12-15 NOTE — Addendum Note (Signed)
Addended by: Horton Marshall on: 12/15/2022 10:51 AM   Modules accepted: Orders

## 2022-12-16 ENCOUNTER — Inpatient Hospital Stay: Payer: PPO | Attending: Hematology

## 2022-12-16 ENCOUNTER — Inpatient Hospital Stay: Payer: PPO | Admitting: Hematology

## 2022-12-16 DIAGNOSIS — E538 Deficiency of other specified B group vitamins: Secondary | ICD-10-CM

## 2022-12-16 DIAGNOSIS — Z85828 Personal history of other malignant neoplasm of skin: Secondary | ICD-10-CM | POA: Diagnosis not present

## 2022-12-16 DIAGNOSIS — D6851 Activated protein C resistance: Secondary | ICD-10-CM | POA: Diagnosis not present

## 2022-12-16 DIAGNOSIS — D510 Vitamin B12 deficiency anemia due to intrinsic factor deficiency: Secondary | ICD-10-CM | POA: Diagnosis not present

## 2022-12-16 DIAGNOSIS — B353 Tinea pedis: Secondary | ICD-10-CM | POA: Diagnosis not present

## 2022-12-16 DIAGNOSIS — Z86718 Personal history of other venous thrombosis and embolism: Secondary | ICD-10-CM | POA: Diagnosis not present

## 2022-12-16 DIAGNOSIS — B070 Plantar wart: Secondary | ICD-10-CM | POA: Diagnosis not present

## 2022-12-16 DIAGNOSIS — Z8509 Personal history of malignant neoplasm of other digestive organs: Secondary | ICD-10-CM | POA: Diagnosis not present

## 2022-12-16 DIAGNOSIS — C49A3 Gastrointestinal stromal tumor of small intestine: Secondary | ICD-10-CM

## 2022-12-16 DIAGNOSIS — Z08 Encounter for follow-up examination after completed treatment for malignant neoplasm: Secondary | ICD-10-CM | POA: Diagnosis not present

## 2022-12-16 LAB — CBC WITH DIFFERENTIAL/PLATELET
Abs Immature Granulocytes: 0.03 10*3/uL (ref 0.00–0.07)
Basophils Absolute: 0 10*3/uL (ref 0.0–0.1)
Basophils Relative: 0 %
Eosinophils Absolute: 0.1 10*3/uL (ref 0.0–0.5)
Eosinophils Relative: 2 %
HCT: 43.3 % (ref 36.0–46.0)
Hemoglobin: 13.9 g/dL (ref 12.0–15.0)
Immature Granulocytes: 0 %
Lymphocytes Relative: 34 %
Lymphs Abs: 2.4 10*3/uL (ref 0.7–4.0)
MCH: 29.6 pg (ref 26.0–34.0)
MCHC: 32.1 g/dL (ref 30.0–36.0)
MCV: 92.3 fL (ref 80.0–100.0)
Monocytes Absolute: 0.6 10*3/uL (ref 0.1–1.0)
Monocytes Relative: 9 %
Neutro Abs: 3.8 10*3/uL (ref 1.7–7.7)
Neutrophils Relative %: 55 %
Platelets: 311 10*3/uL (ref 150–400)
RBC: 4.69 MIL/uL (ref 3.87–5.11)
RDW: 13.2 % (ref 11.5–15.5)
WBC: 7 10*3/uL (ref 4.0–10.5)
nRBC: 0 % (ref 0.0–0.2)

## 2022-12-16 LAB — COMPREHENSIVE METABOLIC PANEL
ALT: 22 U/L (ref 0–44)
AST: 22 U/L (ref 15–41)
Albumin: 3.8 g/dL (ref 3.5–5.0)
Alkaline Phosphatase: 63 U/L (ref 38–126)
Anion gap: 11 (ref 5–15)
BUN: 21 mg/dL (ref 8–23)
CO2: 28 mmol/L (ref 22–32)
Calcium: 9.3 mg/dL (ref 8.9–10.3)
Chloride: 100 mmol/L (ref 98–111)
Creatinine, Ser: 0.81 mg/dL (ref 0.44–1.00)
GFR, Estimated: >60 mL/min (ref >60)
Glucose, Bld: 105 mg/dL — ABNORMAL HIGH (ref 70–99)
Potassium: 3.5 mmol/L (ref 3.5–5.1)
Sodium: 139 mmol/L (ref 135–145)
Total Bilirubin: 0.6 mg/dL (ref <1.2)
Total Protein: 7.7 g/dL (ref 6.5–8.1)

## 2022-12-16 LAB — VITAMIN B12: Vitamin B-12: 559 pg/mL (ref 180–914)

## 2022-12-22 ENCOUNTER — Other Ambulatory Visit: Payer: PPO

## 2022-12-29 NOTE — Progress Notes (Signed)
[facility] [address], [place] [address]    Clinic Day:  12/30/2022  Referring physician: Bennie Pierini, *  Patient Care Team: Bennie Pierini, FNP as PCP - General (Family Medicine) Doreatha Massed, MD as Consulting Physician (Oncology) [place], Peter M, MD as Consulting Physician (Cardiology) Conley Rolls, [place] Cor[place], [place] as Referring Physician (Optometry)   ASSESSMENT & PLAN:   Assessment: 1.  T3N0 Gist of small bowel: -Status post resection on 01/05/2011, pathology showing 5.8 cm, 0/8 lymph nodes involved, margins negative, 1 mitosis/50 HPF, intermediate grade.  Presentation was with GI bleed. -Adjuvant imatinib complicated by cardiac arrhythmias. -Last CT CAP dated 01/31/2017 did not show any evidence of metastatic disease.   2.  Recurrent DVT: -She was on warfarin for 32 years and was switched to Xarelto in August 2018. - She also has factor V Leiden heterozygosity..   3.  Pernicious anemia: -She is on B12 injections monthly.   4.  Health maintenance: -Mammogram on 05/18/2019 was BI-RADS Category 1.    Plan: 1.  T3N0 Gist of small bowel: - Initial presentation is with GI bleed.  She denies any change in bowel habits or bleeding at this time. - Physical exam was unremarkable.  RTC 1 year for follow-up.   2.  Recurrent DVT: - Continue Xarelto indefinitely.  No bleeding issues reported.   3.  Pernicious anemia: - Continue B12 injections every 4 weeks.  B12 level is normal at 559.   4.  Elevated tryptase levels: - She has not had any allergic reactions lately.  She did not have to use EpiPen. - She always had allergy reaction to bees.  Recently she had new onset allergy to salmon and shellfish and developed mouth swelling since summer 2023 after eating both of them.  She always had allergy to pecans.  She was seen by Dr. Dellis Anes and was found to have elevated tryptase levels.  She had to take Benadryl couple of times after eating above  foods.  - Her daughter Nicholaus Bloom also has elevated tryptase levels.    Orders Placed This Encounter  Procedures   CBC with Differential    Standing Status:   Future    Standing Expiration Date:   12/30/2023   Comprehensive metabolic panel    Standing Status:   Future    Standing Expiration Date:   12/30/2023   Vitamin B12    Standing Status:   Future    Standing Expiration Date:   12/30/2023      I,Katie Daubenspeck,acting as a scribe for Doreatha Massed, MD.,have documented all relevant documentation on the behalf of Doreatha Massed, MD,as directed by  Doreatha Massed, MD while in the presence of Doreatha Massed, MD.   I, Doreatha Massed MD, have reviewed the above documentation for accuracy and completeness, and I agree with the above.   Doreatha Massed, MD   12/4/20249:35 AM  CHIEF COMPLAINT:   Diagnosis: factor V Leiden mutation, history of GIST of small intestine    Cancer Staging  GIST (gastrointestinal stromal tumor), malignant ([name]) Staging form: Soft Tissue Sarcoma, AJCC 7th Edition - Clinical: Stage IIB (T2, N0, M0) - Signed by Ellouise Newer, PA on 01/29/2011    Prior Therapy: 1. Partial small bowel resection on 01/05/2011. 2. Adjuvant imatinib, stopped due to cardiac arrhythmias  Current Therapy:  Xarelto and observation    HISTORY OF PRESENT ILLNESS:   Oncology History  GIST (gastrointestinal stromal tumor), malignant ([name])  01/03/2011 Initial Diagnosis  GIST (gastrointestinal stromal tumor), malignant ([name])   01/03/2011 Imaging   CT abd/pelvis- 5.0 cm soft tissue mass associated with distal small bowel. Findings characteristic of gastrointestinal stromal tumor. Adenocarcinoma or malignant transformation are not excluded.   Stable left adrenal adenoma.   04/03/2011 Imaging   CT abd/pelvis- Small bowel obstruction at small bowel anastomosis. Single loop of small bowel in the left mid abdomen shows wall thickening,  nonspecific; this can be seen with infection, inflammatory bowel disease and ischemia. Free intraperitoneal fluid. Small right pleural effusion and bibasilar atelectasis. Left adrenal adenoma.   01/25/2012 Imaging   CT abd/pelvis- Prior small bowel resection with anastomoses in the left mid abdomen.  No evidence of metastatic disease in the abdomen/pelvis.   No evidence of bowel obstruction.   Focal eccentric wall thickening along the lateral aspect of the cecum.  Colonoscopy is suggested to exclude a primary colonic neoplasm.     01/25/2013 Imaging   CT abd/pelvis- 1. Thickening through the gastric cardiac region likely represents redundant folds of normal mucosa. Recommend attention on follow-up. 2. Mild haziness to the central mesentery is similar to and prior likely related to prior bowel surgery. No evidence of GIST recurrence within the small bowel. 3. No evidence of adenopathy or mass in the abdomen or pelvis. 4. Stable left adrenal adenoma. 5. 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   01/15/2014 Imaging   CT abd/pelvis- Stable exam. No evidence for recurrent disease in the abdomen or pelvis.   Stable left adrenal adenoma.   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   01/10/2015 Imaging   CT abd/pelvis- 1. Stable exam. No evidence for recurrent disease within the abdomen or pelvis. 2. Stable left adrenal gland adenoma. 3. 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   01/23/2015 Imaging   CT abd/pelvis- Small bowel obstruction with a transition zone just distal to the ileo ileal anastomosis in the anterior pelvis and secondary to peritoneal adhesions.      INTERVAL HISTORY:   [name] is a 72 y.o. female presenting to clinic today for follow up of small bowel GIST and B12 deficiency. She was last seen by me on 12/24/2021.  Today, she states that she is doing well overall.  Her appetite level is at 75%. Her energy level is at 30%.  PAST MEDICAL HISTORY:   Past Medical History: Past Medical History:  Diagnosis Date   Allergic urticaria 01/04/2011   Rash from tape.   Anxiety    Cellulitis of left leg 2006   Clotting disorder ([name])    heterozygosity from factor v leiden   DVT (deep venous thrombosis) ([name]) 07/10/2010   on coumadin   Dyspnea    with exertion   Factor V Leiden ([name])    GERD (gastroesophageal reflux disease)    GIST (gastrointestinal stromal tumor), malignant ([name]) 01/03/2011   S/P resection on 01/05/11.  Intolerant to [employer]. Skin Cancer Left hand   History of blood transfusion    Hypertension    Pernicious anemia 07/10/2010   Small bowel mass 01/03/2011   s/p surgery   Ulcer 05/2009   esophageal   Ventricular tachycardia ([name]) 03/26/11   Vitamin B12 deficiency    vit b12 1000 mcg monthly    Surgical History: Past Surgical History:  Procedure Laterality Date   ABDOMINAL HYSTERECTOMY  1989   BALLOON DILATION  12/11/2010   Procedure: BALLOON DILATION;  Surgeon: Joline Maxcy  Karilyn Cota, MD;  Location: [facility];  Service: Endoscopy;  Laterality: N/A;   BOWEL RESECTION  01/05/2011   Procedure: SMALL BOWEL RESECTION;  Surgeon: Dalia Heading;  Location: [facility];  Service: General;;  Partial Small Bowel Resection   COLONOSCOPY  02/25/2012   Procedure: COLONOSCOPY;  Surgeon: Malissa Hippo, MD;  Location: [facility];  Service: Endoscopy;  Laterality: N/A;  1200   GIVENS CAPSULE STUDY  01/02/2011   Procedure: GIVENS CAPSULE STUDY;  Surgeon: Malissa Hippo, MD;  Location: [facility];  Service: Endoscopy;  Laterality: N/A;   LAPAROTOMY  01/05/2011   Procedure: EXPLORATORY LAPAROTOMY;  Surgeon: Dalia Heading;  Location: [facility];  Service: General;  Laterality: N/A;   OPEN REDUCTION INTERNAL FIXATION (ORIF) SCAPHOID WITH DISTAL RADIUS GRAFT Right 02/01/2016   Procedure: Right distal radius open reduction and internal fixation and repair as  indicated;  Surgeon: Bradly Bienenstock, MD;  Location: [facility];  Service: Orthopedics;  Laterality: Right;  Requests 90 mins    Social History: Social History   Socioeconomic History   Marital status: Married    Spouse name: Fayrene Fearing   Number of children: 3   Years of education: Not on file   Highest education level: Not on file  Occupational History    Employer: [employer]   Occupation: CNA    Employer: [employer]  Tobacco Use   Smoking status: Never   Smokeless tobacco: Never  Vaping Use   Vaping status: Never Used  Substance and Sexual Activity   Alcohol use: No   Drug use: No   Sexual activity: Not Currently  Other Topics Concern   Not on file  Social History Narrative   Married x 53 years.   2 children living. Youngest son died in MVA.   2 granddaughters   Social Determinants of Health   Financial Resource Strain: Low Risk  (04/29/2022)   Overall Financial Resource Strain (CARDIA)    Difficulty of Paying Living Expenses: Not hard at all  Food Insecurity: No Food Insecurity (04/29/2022)   Hunger Vital Sign    Worried About Running Out of Food in the Last Year: Never true    Ran Out of Food in the Last Year: Never true  Transportation Needs: No Transportation Needs (04/29/2022)   PRAPARE - Administrator, Civil Service (Medical): No    Lack of Transportation (Non-Medical): No  Physical Activity: Inactive (04/29/2022)   Exercise Vital Sign    Days of Exercise per Week: 0 days    Minutes of Exercise per Session: 0 min  Stress: No Stress Concern Present (04/29/2022)   [employer] of Occupational Health - Occupational Stress Questionnaire    Feeling of Stress : Not at all  Social Connections: Socially Integrated (04/29/2022)   Social Connection and Isolation Panel [NHANES]    Frequency of Communication with Friends and Family: More than three times a week    Frequency of Social Gatherings with Friends and Family: More than three times a week    Attends Religious  Services: More than 4 times per year    Active Member of [employer] or Organizations: Yes    Attends Banker Meetings: More than 4 times per year    Marital Status: Married  Catering manager Violence: Not At Risk (04/29/2022)   Humiliation, Afraid, Rape, and Kick questionnaire    Fear of Current or Ex-Partner: No    Emotionally Abused: No    Physically Abused: No  Sexually Abused: No    Family History: Family History  Problem Relation Age of Onset   Depression Mother    Parkinson's disease Mother    Allergic rhinitis Brother    Angioedema Neg Hx    Asthma Neg Hx    Eczema Neg Hx    Immunodeficiency Neg Hx    Urticaria Neg Hx     Current Medications:  Current Outpatient Medications:    acetaminophen (TYLENOL) 500 MG tablet, Take 500-1,000 mg by mouth every 6 (six) hours as needed for moderate pain. , Disp: , Rfl:    cyanocobalamin (VITAMIN B12) 1000 MCG/ML injection, INJECT IM EVERY 30 DAYS, Disp: 1 mL, Rfl: 11   diphenhydrAMINE (BENADRYL) 25 mg capsule, Take 25 mg by mouth every 6 (six) hours as needed for allergies (bee stings). , Disp: , Rfl:    losartan-hydrochlorothiazide (HYZAAR) 100-25 MG tablet, Take 1 tablet by mouth daily., Disp: 90 tablet, Rfl: 1   metoprolol tartrate (LOPRESSOR) 25 MG tablet, Take 0.5 tablets (12.5 mg total) by mouth 2 (two) times daily. (Needs to be seen before next refill), Disp: 180 tablet, Rfl: 1   omeprazole (PRILOSEC) 20 MG capsule, Take 1 capsule (20 mg total) by mouth daily., Disp: 90 capsule, Rfl: 1   rivaroxaban (XARELTO) 20 MG TABS tablet, Take 1 tablet (20 mg total) by mouth daily with supper., Disp: 30 tablet, Rfl: 5   Allergies: Allergies  Allergen Reactions   Bee Venom Swelling and Rash    SWELLING REACTION UNSPECIFIED  SWELLING REACTION UNSPECIFIED    Cephalexin Swelling    PATIENT WITH Rx OF ANAPHYLAXIS TO PCN's SWELLING REACTION UNSPECIFIED  PATIENT WITH Rx OF ANAPHYLAXIS TO PCN's SWELLING REACTION UNSPECIFIED     Dexlansoprazole Swelling    SWELLING REACTION UNSPECIFIED  SWELLING REACTION UNSPECIFIED    Imatinib Other (See Comments)    Cardiac dysrhythmia   Other Swelling    PECANS MOUTH SWELLS   Penicillins Anaphylaxis    Has patient had a PCN reaction causing immediate rash, facial/tongue/throat swelling, SOB or lightheadedness with hypotension: No no Has patient had a PCN reaction causing severe rash involving mucus membranes or skin necrosis: No Has patient had a PCN reaction that required hospitalization No Has patient had a PCN reaction occurring within the last 10 years: No If all of the above answers are "NO", then may proceed with Cephalosporin use.    Ace Inhibitors Other (See Comments)    angioedema   Doxycycline Hyclate    Latex Itching   Nylon Rash   Sulfa Antibiotics Rash   Sulfonamide Derivatives Rash   Tape Itching    Paper tape is ok Paper tape is ok    REVIEW OF SYSTEMS:   Review of Systems  Constitutional:  Negative for chills, fatigue and fever.  HENT:   Negative for lump/mass, mouth sores, nosebleeds, sore throat and trouble swallowing.   Eyes:  Negative for eye problems.  Respiratory:  Positive for cough and shortness of breath.   Cardiovascular:  Negative for chest pain, leg swelling and palpitations.  Gastrointestinal:  Negative for abdominal pain, constipation, diarrhea, nausea and vomiting.  Genitourinary:  Negative for bladder incontinence, difficulty urinating, dysuria, frequency, hematuria and nocturia.   Musculoskeletal:  Negative for arthralgias, back pain, flank pain, myalgias and neck pain.  Skin:  Negative for itching and rash.  Neurological:  Positive for dizziness. Negative for headaches and numbness.  Hematological:  Does not bruise/bleed easily.  Psychiatric/Behavioral:  Negative for depression, sleep disturbance and  suicidal ideas. The patient is not nervous/anxious.   All other systems reviewed and are negative.    VITALS:   There were no  vitals taken for this visit.  Wt Readings from Last 3 Encounters:  10/05/22 237 lb (107.5 kg)  04/29/22 246 lb (111.6 kg)  04/02/22 246 lb (111.6 kg)    There is no height or weight on file to calculate BMI.  Performance status (ECOG): 1 - Symptomatic but completely ambulatory  PHYSICAL EXAM:   Physical Exam Vitals and nursing note reviewed. Exam conducted with a chaperone present.  Constitutional:      Appearance: Normal appearance.  Cardiovascular:     Rate and Rhythm: Normal rate and regular rhythm.     Pulses: Normal pulses.     Heart sounds: Normal heart sounds.  Pulmonary:     Effort: Pulmonary effort is normal.     Breath sounds: Normal breath sounds.  Abdominal:     Palpations: Abdomen is soft. There is no hepatomegaly, splenomegaly or mass.     Tenderness: There is no abdominal tenderness.  Musculoskeletal:     Right lower leg: No edema.     Left lower leg: No edema.  Lymphadenopathy:     Cervical: No cervical adenopathy.     Right cervical: No superficial, deep or posterior cervical adenopathy.    Left cervical: No superficial, deep or posterior cervical adenopathy.     Upper Body:     Right upper body: No supraclavicular or axillary adenopathy.     Left upper body: No supraclavicular or axillary adenopathy.  Neurological:     General: No focal deficit present.     Mental Status: She is alert and oriented to person, place, and time.  Psychiatric:        Mood and Affect: Mood normal.        Behavior: Behavior normal.     LABS:      Latest Ref Rng & Units 12/16/2022   10:23 AM 10/05/2022    9:00 AM 12/15/2021    9:08 AM  CBC  WBC 4.0 - 10.5 K/uL 7.0  5.5  5.6   Hemoglobin 12.0 - 15.0 g/dL 16.1  09.6  04.5   Hematocrit 36.0 - 46.0 % 43.3  43.0  41.7   Platelets 150 - 400 K/uL 311  320  300       Latest Ref Rng & Units 12/16/2022   10:23 AM 10/05/2022    9:00 AM 12/15/2021    9:08 AM  CMP  Glucose 70 - 99 mg/dL 409  811  82   BUN 8 - 23 mg/dL 21  17   16    Creatinine 0.44 - 1.00 mg/dL 9.14  7.82  9.56   Sodium 135 - 145 mmol/L 139  140  140   Potassium 3.5 - 5.1 mmol/L 3.5  4.1  3.3   Chloride 98 - 111 mmol/L 100  101  102   CO2 22 - 32 mmol/L 28  26  25    Calcium 8.9 - 10.3 mg/dL 9.3  9.5  9.6   Total Protein 6.5 - 8.1 g/dL 7.7  7.1  6.9   Total Bilirubin <1.2 mg/dL 0.6  0.5  0.3   Alkaline Phos 38 - 126 U/L 63  85  72   AST 15 - 41 U/L 22  22  20    ALT 0 - 44 U/L 22  20  22       Lab Results  Component Value Date  CEA <0.5 07/14/2013   /  CEA  Date Value Ref Range Status  07/14/2013 <0.5 0.0 - 5.0 ng/mL Final    Comment:    Performed at [facility]   No results found for: "PSA1" No results found for: "HQI696" No results found for: "CAN125"  No results found for: "TOTALPROTELP", "ALBUMINELP", "A1GS", "A2GS", "BETS", "BETA2SER", "GAMS", "MSPIKE", "SPEI" Lab Results  Component Value Date   TIBC 305 12/23/2020   TIBC 305 12/19/2019   TIBC 326 07/11/2019   FERRITIN 68 12/23/2020   FERRITIN 57 12/19/2019   FERRITIN 44 07/11/2019   IRONPCTSAT 30 12/23/2020   IRONPCTSAT 26 12/19/2019   IRONPCTSAT 34 (H) 07/11/2019   Lab Results  Component Value Date   LDH 139 12/23/2020   LDH 146 12/19/2019   LDH 165 07/11/2019     STUDIES:   No results found.

## 2022-12-30 ENCOUNTER — Inpatient Hospital Stay: Payer: PPO | Attending: Hematology | Admitting: Hematology

## 2022-12-30 DIAGNOSIS — D6851 Activated protein C resistance: Secondary | ICD-10-CM | POA: Insufficient documentation

## 2022-12-30 DIAGNOSIS — Z86718 Personal history of other venous thrombosis and embolism: Secondary | ICD-10-CM | POA: Diagnosis not present

## 2022-12-30 DIAGNOSIS — C49A3 Gastrointestinal stromal tumor of small intestine: Secondary | ICD-10-CM

## 2022-12-30 DIAGNOSIS — Z7901 Long term (current) use of anticoagulants: Secondary | ICD-10-CM | POA: Insufficient documentation

## 2022-12-30 DIAGNOSIS — E538 Deficiency of other specified B group vitamins: Secondary | ICD-10-CM | POA: Diagnosis not present

## 2022-12-30 DIAGNOSIS — D510 Vitamin B12 deficiency anemia due to intrinsic factor deficiency: Secondary | ICD-10-CM | POA: Insufficient documentation

## 2022-12-30 DIAGNOSIS — R748 Abnormal levels of other serum enzymes: Secondary | ICD-10-CM | POA: Diagnosis not present

## 2022-12-30 DIAGNOSIS — Z8509 Personal history of malignant neoplasm of other digestive organs: Secondary | ICD-10-CM | POA: Insufficient documentation

## 2022-12-30 NOTE — Patient Instructions (Addendum)
[facility] at [facility] Discharge Instructions   You were seen and examined today by Dr. Ellin Saba.  He reviewed the results of your lab work which are normal.   We will see you back in 1 year. We will repeat lab work prior to this visit.   Return as scheduled.    Thank you for choosing [facility] at [facility] to provide your oncology and hematology care.  To afford each patient quality time with our provider, please arrive at least 15 minutes before your scheduled appointment time.   If you have a lab appointment with the Cancer Center please come in thru the Main Entrance and check in at the main information desk.  You need to re-schedule your appointment should you arrive 10 or more minutes late.  We strive to give you quality time with our providers, and arriving late affects you and other patients whose appointments are after yours.  Also, if you no show three or more times for appointments you may be dismissed from the clinic at the providers discretion.     Again, thank you for choosing [facility].  Our hope is that these requests will decrease the amount of time that you wait before being seen by our physicians.       _____________________________________________________________  Should you have questions after your visit to [facility], please contact our office at [phone] and follow the prompts.  Our office hours are 8:00 a.m. and 4:30 p.m. Monday - Friday.  Please note that voicemails left after 4:00 p.m. may not be returned until the following business day.  We are closed weekends and major holidays.  You do have access to a nurse 24-7, just call the main number to the clinic [phone] and do not press any options, hold on the line and a nurse will answer the phone.    For prescription refill requests, have your pharmacy contact our office and allow 72 hours.    Due to Covid, you will need to  wear a mask upon entering the hospital. If you do not have a mask, a mask will be given to you at the Main Entrance upon arrival. For doctor visits, patients may have 1 support person age 53 or older with them. For treatment visits, patients can not have anyone with them due to social distancing guidelines and our immunocompromised population.

## 2023-01-01 ENCOUNTER — Encounter: Payer: Self-pay | Admitting: Nurse Practitioner

## 2023-01-01 ENCOUNTER — Ambulatory Visit (INDEPENDENT_AMBULATORY_CARE_PROVIDER_SITE_OTHER): Payer: PPO | Admitting: Nurse Practitioner

## 2023-01-01 VITALS — BP 148/95 | HR 54 | Temp 97.9°F | Resp 20 | Ht 67.0 in | Wt 238.0 lb

## 2023-01-01 DIAGNOSIS — R739 Hyperglycemia, unspecified: Secondary | ICD-10-CM

## 2023-01-01 DIAGNOSIS — R42 Dizziness and giddiness: Secondary | ICD-10-CM | POA: Diagnosis not present

## 2023-01-01 MED ORDER — MECLIZINE HCL 25 MG PO TABS: 25.0000 mg | ORAL_TABLET | Freq: Three times a day (TID) | ORAL | 0 refills | Status: DC | PRN

## 2023-01-01 MED ORDER — PREDNISONE 20 MG PO TABS: 40.0000 mg | ORAL_TABLET | Freq: Every day | ORAL | 0 refills | Status: AC

## 2023-01-01 NOTE — Patient Instructions (Signed)
Vertigo Vertigo is the feeling that you or the things around you are moving when they are not. This feeling can come and go at any time. Vertigo often goes away on its own. This condition can be dangerous if it happens when you are doing activities like driving or working with machines. Your doctor will do tests to find the cause of your vertigo. These tests will also help your doctor decide on the best treatment for you. Follow these instructions at home: Eating and drinking     Drink enough fluid to keep your pee (urine) pale yellow. Do not drink alcohol. Activity Return to your normal activities when your doctor says that it is safe. In the morning, first sit up on the side of the bed. When you feel okay, stand slowly while you hold onto something until you know that your balance is fine. Move slowly. Avoid sudden body or head movements or certain positions, as told by your doctor. Use a cane if you have trouble standing or walking. Sit down right away if you feel dizzy. Avoid doing any tasks or activities that can cause danger to you or others if you get dizzy. Avoid bending down if you feel dizzy. Place items in your home so that they are easy for you to reach without bending or leaning over. Do not drive or use machinery if you feel dizzy. General instructions Take over-the-counter and prescription medicines only as told by your doctor. Keep all follow-up visits. Contact a doctor if: Your medicine does not help your vertigo. Your problems get worse or you have new symptoms. You have a fever. You feel like you may vomit (nauseous), or this feeling gets worse. You start to vomit. Your family or friends see changes in how you act. You lose feeling (have numbness) in part of your body. You feel prickling and tingling in a part of your body. Get help right away if: You are always dizzy. You faint. You get very bad headaches. You get a stiff neck. Bright light starts to bother  you. You have trouble moving or talking. You feel weak in your hands, arms, or legs. You have changes in your hearing or in how you see (vision). These symptoms may be an emergency. Get help right away. Call your local emergency services (911 in the [place] Do not wait to see if the symptoms will go away. Do not drive yourself to the hospital. Summary Vertigo is the feeling that you or the things around you are moving when they are not. Your doctor will do tests to find the cause of your vertigo. You may be told to avoid some tasks, positions, or movements. Contact a doctor if your medicine is not helping, or if you have a fever, new symptoms, or a change in how you act. Get help right away if you get very bad headaches, or if you have changes in how you speak, hear, or see. This information is not intended to replace advice given to you by your health care provider. Make sure you discuss any questions you have with your health care provider. Document Revised: 12/13/2019 Document Reviewed: 12/13/2019 Elsevier Patient Education  2024 [employer]  

## 2023-01-01 NOTE — Progress Notes (Signed)
Subjective:    Patient ID: [name], female    DOB: 12/08/1950, 72 y.o.   MRN: [id number]   Chief Complaint: dizziness  Patient had colon cancer  in 2012. She still sees oncology yearly. She had follow up visit on 12/30/22. They did blood work and her blood sugar was elevated and he wanted her hgba1c checked.  Dizziness This is a new problem. The current episode started 1 to 4 weeks ago. The problem occurs constantly. Pertinent negatives include no abdominal pain, chest pain, chills, congestion, diaphoresis, headaches, rash or weakness. Nothing aggravates the symptoms. She has tried nothing for the symptoms. The treatment provided mild relief.    Patient Active Problem List   Diagnosis Date Noted   Pain in left knee 12/05/2020   Primary hypertension 11/11/2020   Morbid obesity ([name]) 11/11/2020   Gastroesophageal reflux disease without esophagitis 11/11/2020   Unilateral primary osteoarthritis, left knee 05/26/2017   Depression 11/19/2014   SVT (supraventricular tachycardia) ([name]) 03/25/2011   GIST (gastrointestinal stromal tumor), malignant ([name]) 01/03/2011   Chronic ulcer of left leg ([name]) 01/01/2011   Chronic anticoagulation 01/01/2011   Factor V Leiden ([name])    DVT (deep venous thrombosis) ([name]) 07/10/2010   Pernicious anemia 07/10/2010   Iron deficiency anemia 08/27/2009       Review of Systems  Constitutional:  Negative for chills and diaphoresis.  HENT:  Negative for congestion.   Eyes:  Negative for pain.  Respiratory:  Negative for shortness of breath.   Cardiovascular:  Negative for chest pain, palpitations and leg swelling.  Gastrointestinal:  Negative for abdominal pain.  Endocrine: Negative for polydipsia.  Skin:  Negative for rash.  Neurological:  Positive for dizziness. Negative for weakness and headaches.  Hematological:  Does not bruise/bleed easily.  All other systems reviewed and are negative.      Objective:   Physical Exam Vitals  reviewed.  Constitutional:      Appearance: Normal appearance.  Cardiovascular:     Rate and Rhythm: Normal rate and regular rhythm.     Heart sounds: Normal heart sounds.  Pulmonary:     Effort: Pulmonary effort is normal.     Breath sounds: Normal breath sounds.  Skin:    General: Skin is warm.  Neurological:     General: No focal deficit present.     Mental Status: She is alert and oriented to person, place, and time.     Cranial Nerves: No cranial nerve deficit.     Sensory: No sensory deficit.  Psychiatric:        Mood and Affect: Mood normal.        Behavior: Behavior normal.     BP (!) 148/95   Pulse (!) 54   Temp 97.9 F (36.6 C) (Temporal)   Resp 20   Ht 5\' 7"  (1.702 m)   Wt 238 lb (108 kg)   SpO2 99%   BMI 37.28 kg/m   Hgba1c 5.4%     Assessment & Plan:  [name] in today with chief complaint of Dizziness (Cancer Dr concerned about high blood sugar)   1. Elevated blood sugar Watch carbs some - Bayer DCA Hb A1c Waived  2. Vertigo Force fluids Rest RTOprn Sedation precautions with meclizine - predniSONE (DELTASONE) 20 MG tablet; Take 2 tablets (40 mg total) by mouth daily with breakfast for 5 days. 2 po daily for 5 days  Dispense: 10 tablet; Refill: 0 - meclizine (ANTIVERT) 25 MG tablet; Take 1  tablet (25 mg total) by mouth 3 (three) times daily as needed for dizziness.  Dispense: 30 tablet; Refill: 0    The above assessment and management plan was discussed with the patient. The patient verbalized understanding of and has agreed to the management plan. Patient is aware to call the clinic if symptoms persist or worsen. Patient is aware when to return to the clinic for a follow-up visit. Patient educated on when it is appropriate to go to the emergency department.   [name] Daphine Deutscher, FNP

## 2023-01-04 LAB — BAYER DCA HB A1C WAIVED: HB A1C (BAYER DCA - WAIVED): 5.4 % (ref 4.8–5.6)

## 2023-01-25 DIAGNOSIS — X32XXXD Exposure to sunlight, subsequent encounter: Secondary | ICD-10-CM | POA: Diagnosis not present

## 2023-01-25 DIAGNOSIS — B353 Tinea pedis: Secondary | ICD-10-CM | POA: Diagnosis not present

## 2023-01-25 DIAGNOSIS — L570 Actinic keratosis: Secondary | ICD-10-CM | POA: Diagnosis not present

## 2023-04-06 ENCOUNTER — Ambulatory Visit (INDEPENDENT_AMBULATORY_CARE_PROVIDER_SITE_OTHER): Payer: PPO | Admitting: Nurse Practitioner

## 2023-04-06 ENCOUNTER — Encounter: Payer: Self-pay | Admitting: Nurse Practitioner

## 2023-04-06 VITALS — BP 116/68 | HR 81 | Temp 97.8°F | Ht 67.0 in | Wt 244.0 lb

## 2023-04-06 DIAGNOSIS — I82522 Chronic embolism and thrombosis of left iliac vein: Secondary | ICD-10-CM

## 2023-04-06 DIAGNOSIS — I471 Supraventricular tachycardia, unspecified: Secondary | ICD-10-CM

## 2023-04-06 DIAGNOSIS — K219 Gastro-esophageal reflux disease without esophagitis: Secondary | ICD-10-CM | POA: Diagnosis not present

## 2023-04-06 DIAGNOSIS — D508 Other iron deficiency anemias: Secondary | ICD-10-CM

## 2023-04-06 DIAGNOSIS — I10 Essential (primary) hypertension: Secondary | ICD-10-CM | POA: Diagnosis not present

## 2023-04-06 DIAGNOSIS — D510 Vitamin B12 deficiency anemia due to intrinsic factor deficiency: Secondary | ICD-10-CM

## 2023-04-06 DIAGNOSIS — F3341 Major depressive disorder, recurrent, in partial remission: Secondary | ICD-10-CM

## 2023-04-06 DIAGNOSIS — Z7901 Long term (current) use of anticoagulants: Secondary | ICD-10-CM

## 2023-04-06 LAB — LIPID PANEL

## 2023-04-06 MED ORDER — LOSARTAN POTASSIUM-HCTZ 100-25 MG PO TABS: 1.0000 | ORAL_TABLET | Freq: Every day | ORAL | 1 refills | Status: DC

## 2023-04-06 MED ORDER — OMEPRAZOLE 20 MG PO CPDR: 20.0000 mg | DELAYED_RELEASE_CAPSULE | Freq: Every day | ORAL | 1 refills | Status: DC

## 2023-04-06 MED ORDER — RIVAROXABAN 20 MG PO TABS: 20.0000 mg | ORAL_TABLET | Freq: Every day | ORAL | 5 refills | Status: DC

## 2023-04-06 MED ORDER — METOPROLOL TARTRATE 25 MG PO TABS: 12.5000 mg | ORAL_TABLET | Freq: Two times a day (BID) | ORAL | 1 refills | Status: DC

## 2023-04-06 NOTE — Patient Instructions (Signed)
 Fall Prevention in the Home, Adult Falls can cause injuries and can happen to people of all ages. There are many things you can do to make your home safer and to help prevent falls. What actions can I take to prevent falls? General information Use good lighting in all rooms. Make sure to: Replace any light bulbs that burn out. Turn on the lights in dark areas and use night-lights. Keep items that you use often in easy-to-reach places. Lower the shelves around your home if needed. Move furniture so that there are clear paths around it. Do not use throw rugs or other things on the floor that can make you trip. If any of your floors are uneven, fix them. Add color or contrast paint or tape to clearly mark and help you see: Grab bars or handrails. First and last steps of staircases. Where the edge of each step is. If you use a ladder or stepladder: Make sure that it is fully opened. Do not climb a closed ladder. Make sure the sides of the ladder are locked in place. Have someone hold the ladder while you use it. Know where your pets are as you move through your home. What can I do in the bathroom?     Keep the floor dry. Clean up any water on the floor right away. Remove soap buildup in the bathtub or shower. Buildup makes bathtubs and showers slippery. Use non-skid mats or decals on the floor of the bathtub or shower. Attach bath mats securely with double-sided, non-slip rug tape. If you need to sit down in the shower, use a non-slip stool. Install grab bars by the toilet and in the bathtub and shower. Do not use towel bars as grab bars. What can I do in the bedroom? Make sure that you have a light by your bed that is easy to reach. Do not use any sheets or blankets on your bed that hang to the floor. Have a firm chair or bench with side arms that you can use for support when you get dressed. What can I do in the kitchen? Clean up any spills right away. If you need to reach something  above you, use a step stool with a grab bar. Keep electrical cords out of the way. Do not use floor polish or wax that makes floors slippery. What can I do with my stairs? Do not leave anything on the stairs. Make sure that you have a light switch at the top and the bottom of the stairs. Make sure that there are handrails on both sides of the stairs. Fix handrails that are broken or loose. Install non-slip stair treads on all your stairs if they do not have carpet. Avoid having throw rugs at the top or bottom of the stairs. Choose a carpet that does not hide the edge of the steps on the stairs. Make sure that the carpet is firmly attached to the stairs. Fix carpet that is loose or worn. What can I do on the outside of my home? Use bright outdoor lighting. Fix the edges of walkways and driveways and fix any cracks. Clear paths of anything that can make you trip, such as tools or rocks. Add color or contrast paint or tape to clearly mark and help you see anything that might make you trip as you walk through a door, such as a raised step or threshold. Trim any bushes or trees on paths to your home. Check to see if handrails are loose  or broken and that both sides of all steps have handrails. Install guardrails along the edges of any raised decks and porches. Have leaves, snow, or ice cleared regularly. Use sand, salt, or ice melter on paths if you live where there is ice and snow during the winter. Clean up any spills in your garage right away. This includes grease or oil spills. What other actions can I take? Review your medicines with your doctor. Some medicines can cause dizziness or changes in blood pressure, which increase your risk of falling. Wear shoes that: Have a low heel. Do not wear high heels. Have rubber bottoms and are closed at the toe. Feel good on your feet and fit well. Use tools that help you move around if needed. These include: Canes. Walkers. Scooters. Crutches. Ask  your doctor what else you can do to help prevent falls. This may include seeing a physical therapist to learn to do exercises to move better and get stronger. Where to find more information Centers for Disease Control and Prevention, STEADI: TonerPromos.no [employer] on Aging: BaseRingTones.pl National Institute on Aging: BaseRingTones.pl Contact a doctor if: You are afraid of falling at home. You feel weak, drowsy, or dizzy at home. You fall at home. Get help right away if you: Lose consciousness or have trouble moving after a fall. Have a fall that causes a head injury. These symptoms may be an emergency. Get help right away. Call 911. Do not wait to see if the symptoms will go away. Do not drive yourself to the hospital. This information is not intended to replace advice given to you by your health care provider. Make sure you discuss any questions you have with your health care provider. Document Revised: 09/15/2021 Document Reviewed: 09/15/2021 Elsevier Patient Education  2024 [employer].

## 2023-04-06 NOTE — Progress Notes (Signed)
 Subjective:    Patient ID: [name], female    DOB: May 07, 1950, 73 y.o.   MRN: [id number]   Chief Complaint: medical management of chronic issues     HPI:  [name] is a 73 y.o. who identifies as a female who was assigned female at birth.   Social history: Lives with: husband Work history: retired   Water engineer in today for follow up of the following chronic medical issues:  1. Primary hypertension No c/o chest pain, sob or headache. Does not check blood pressure at home. BP Readings from Last 3 Encounters:  01/01/23 (!) 148/95  10/05/22 108/69  04/02/22 119/71     2. SVT (supraventricular tachycardia) No c/o of palpitations or heart racing  3. Chronic deep vein thrombosis (DVT) of iliac vein of left lower extremity ([name]) 4. Chronic anticoagulation Is on xeralto without any bleeding issues  5. Gastroesophageal reflux disease without esophagitis Is on omeprazole daily to prevent symptoms  6. Other iron deficiency anemia Lab Results  Component Value Date   HGB 13.9 12/16/2022     7. Recurrent major depressive disorder, in partial remission ([name]) Is not on antidepressant . Says she is currently doing well.    04/06/2023    8:24 AM 01/01/2023    4:16 PM 10/05/2022    8:09 AM  Depression screen PHQ 2/9  Decreased Interest 0 0 0  Down, Depressed, Hopeless 0 0 0  PHQ - 2 Score 0 0 0  Altered sleeping 0 0 0  Tired, decreased energy 2 0 0  Change in appetite 0 0 0  Feeling bad or failure about yourself  0 0 0  Trouble concentrating 0 0 0  Moving slowly or fidgety/restless 0 0 0  Suicidal thoughts 0 0 0  PHQ-9 Score 2 0 0  Difficult doing work/chores Not difficult at all Not difficult at all Not difficult at all      8. Pernicious anemia Is on monthy b12 injections. Lab Results  Component Value Date   VITAMINB12 559 12/16/2022     9. Morbid obesity ([name]) Weight is up 6lbs  Wt Readings from Last 3 Encounters:  04/06/23 244 lb (110.7 kg)   01/01/23 238 lb (108 kg)  10/05/22 237 lb (107.5 kg)   BMI Readings from Last 3 Encounters:  04/06/23 38.22 kg/m  01/01/23 37.28 kg/m  10/05/22 37.12 kg/m       New complaints: None today  Allergies  Allergen Reactions   Bee Venom Swelling and Rash    SWELLING REACTION UNSPECIFIED  SWELLING REACTION UNSPECIFIED    Cephalexin Swelling    PATIENT WITH Rx OF ANAPHYLAXIS TO PCN's SWELLING REACTION UNSPECIFIED  PATIENT WITH Rx OF ANAPHYLAXIS TO PCN's SWELLING REACTION UNSPECIFIED    Dexlansoprazole Swelling    SWELLING REACTION UNSPECIFIED  SWELLING REACTION UNSPECIFIED    Imatinib Other (See Comments)    Cardiac dysrhythmia   Other Swelling    PECANS MOUTH SWELLS   Penicillins Anaphylaxis    Has patient had a PCN reaction causing immediate rash, facial/tongue/throat swelling, SOB or lightheadedness with hypotension: No no Has patient had a PCN reaction causing severe rash involving mucus membranes or skin necrosis: No Has patient had a PCN reaction that required hospitalization No Has patient had a PCN reaction occurring within the last 10 years: No If all of the above answers are "NO", then may proceed with Cephalosporin use.    Ace Inhibitors Other (See Comments)    angioedema  Doxycycline Hyclate    Latex Itching   Nylon Rash   Sulfa Antibiotics Rash   Sulfonamide Derivatives Rash   Tape Itching    Paper tape is ok Paper tape is ok   Outpatient Encounter Medications as of 04/06/2023  Medication Sig   acetaminophen (TYLENOL) 500 MG tablet Take 500-1,000 mg by mouth every 6 (six) hours as needed for moderate pain.    cyanocobalamin (VITAMIN B12) 1000 MCG/ML injection INJECT IM EVERY 30 DAYS   diphenhydrAMINE (BENADRYL) 25 mg capsule Take 25 mg by mouth every 6 (six) hours as needed for allergies (bee stings).    losartan-hydrochlorothiazide (HYZAAR) 100-25 MG tablet Take 1 tablet by mouth daily.   meclizine (ANTIVERT) 25 MG tablet Take 1 tablet (25 mg  total) by mouth 3 (three) times daily as needed for dizziness.   metoprolol tartrate (LOPRESSOR) 25 MG tablet Take 0.5 tablets (12.5 mg total) by mouth 2 (two) times daily. (Needs to be seen before next refill)   omeprazole (PRILOSEC) 20 MG capsule Take 1 capsule (20 mg total) by mouth daily.   rivaroxaban (XARELTO) 20 MG TABS tablet Take 1 tablet (20 mg total) by mouth daily with supper.   No facility-administered encounter medications on file as of 04/06/2023.    Past Surgical History:  Procedure Laterality Date   ABDOMINAL HYSTERECTOMY  1989   BALLOON DILATION  12/11/2010   Procedure: BALLOON DILATION;  Surgeon: Malissa Hippo, MD;  Location: [facility];  Service: Endoscopy;  Laterality: N/A;   BOWEL RESECTION  01/05/2011   Procedure: SMALL BOWEL RESECTION;  Surgeon: Dalia Heading;  Location: [facility];  Service: General;;  Partial Small Bowel Resection   COLONOSCOPY  02/25/2012   Procedure: COLONOSCOPY;  Surgeon: Malissa Hippo, MD;  Location: [facility];  Service: Endoscopy;  Laterality: N/A;  1200   GIVENS CAPSULE STUDY  01/02/2011   Procedure: GIVENS CAPSULE STUDY;  Surgeon: Malissa Hippo, MD;  Location: [facility];  Service: Endoscopy;  Laterality: N/A;   LAPAROTOMY  01/05/2011   Procedure: EXPLORATORY LAPAROTOMY;  Surgeon: Dalia Heading;  Location: [facility];  Service: General;  Laterality: N/A;   OPEN REDUCTION INTERNAL FIXATION (ORIF) SCAPHOID WITH DISTAL RADIUS GRAFT Right 02/01/2016   Procedure: Right distal radius open reduction and internal fixation and repair as indicated;  Surgeon: Bradly Bienenstock, MD;  Location: [facility];  Service: Orthopedics;  Laterality: Right;  Requests 90 mins    Family History  Problem Relation Age of Onset   Depression Mother    Parkinson's disease Mother    Allergic rhinitis Brother    Angioedema Neg Hx    Asthma Neg Hx    Eczema Neg Hx    Immunodeficiency Neg Hx    Urticaria Neg Hx       Controlled substance contract:  n/a     Review of Systems  Constitutional:  Negative for diaphoresis.  Eyes:  Negative for pain.  Respiratory:  Negative for shortness of breath.   Cardiovascular:  Negative for chest pain, palpitations and leg swelling.  Gastrointestinal:  Negative for abdominal pain.  Endocrine: Negative for polydipsia.  Skin:  Negative for rash.  Neurological:  Negative for dizziness, weakness and headaches.  Hematological:  Does not bruise/bleed easily.  All other systems reviewed and are negative.      Objective:   Physical Exam Vitals and nursing note reviewed.  Constitutional:      General: She is not in acute distress.  Appearance: Normal appearance. She is well-developed.  HENT:     Head: Normocephalic.     Right Ear: Tympanic membrane normal.     Left Ear: Tympanic membrane normal.     Nose: Nose normal.     Mouth/Throat:     Mouth: Mucous membranes are moist.  Eyes:     Pupils: Pupils are equal, round, and reactive to light.  Neck:     Vascular: No carotid bruit or JVD.  Cardiovascular:     Rate and Rhythm: Normal rate and regular rhythm.     Heart sounds: Normal heart sounds.  Pulmonary:     Effort: Pulmonary effort is normal. No respiratory distress.     Breath sounds: Normal breath sounds. No wheezing or rales.  Chest:     Chest wall: No tenderness.  Abdominal:     General: Bowel sounds are normal. There is no distension or abdominal bruit.     Palpations: Abdomen is soft. There is no hepatomegaly, splenomegaly, mass or pulsatile mass.     Tenderness: There is no abdominal tenderness.  Musculoskeletal:        General: Normal range of motion.     Cervical back: Normal range of motion and neck supple.  Lymphadenopathy:     Cervical: No cervical adenopathy.  Skin:    General: Skin is warm and dry.  Neurological:     Mental Status: She is alert and oriented to person, place, and time.     Deep Tendon Reflexes: Reflexes are normal and symmetric.  Psychiatric:         Behavior: Behavior normal.        Thought Content: Thought content normal.        Judgment: Judgment normal.    BP 116/68   Pulse 81   Temp 97.8 F (36.6 C) (Temporal)   Ht 5\' 7"  (1.702 m)   Wt 244 lb (110.7 kg)   SpO2 99%   BMI 38.22 kg/m          Assessment & Plan:   LEISHA TRINKLE comes in today with chief complaint of medical management of chronic issues    Diagnosis and orders addressed:  1. Primary hypertension Low sodium diet - losartan-hydrochlorothiazide (HYZAAR) 100-25 MG tablet; Take 1 tablet by mouth daily.  Dispense: 90 tablet; Refill: 1 - CBC with Differential/Platelet - CMP14+EGFR - Lipid panel  2. SVT (supraventricular tachycardia) Avoid caffeine - metoprolol tartrate (LOPRESSOR) 25 MG tablet; Take 0.5 tablets (12.5 mg total) by mouth 2 (two) times daily. (Needs to be seen before next refill)  Dispense: 180 tablet; Refill: 1 - rivaroxaban (XARELTO) 20 MG TABS tablet; Take 1 tablet (20 mg total) by mouth daily with supper.  Dispense: 30 tablet; Refill: 5  3. Chronic deep vein thrombosis (DVT) of iliac vein of left lower extremity ([name]) Report any bleeding issue with xeralto  4. Chronic anticoagulation Report any bleeding issues 5. Gastroesophageal reflux disease without esophagitis Avoid spicy foods Do not eat 2 hours prior to bedtime - omeprazole (PRILOSEC) 20 MG capsule; Take 1 capsule (20 mg total) by mouth daily.  Dispense: 90 capsule; Refill: 1  6. Other iron deficiency anemia Labs pending  7. Recurrent major depressive disorder, in partial remission ([name]) Stress management  8. Pernicious anemia - cyanocobalamin (VITAMIN B12) 1000 MCG/ML injection; INJECT IM EVERY 30 DAYS  Dispense: 1 mL; Refill: 11  9. Morbid obesity ([name]) Discussed diet and exercise for person with BMI >25 Will recheck weight in 3-6  months    Labs pending Health Maintenance reviewed Diet and exercise encouraged  Follow up plan: 6  months   [name] Daphine Deutscher, FNP

## 2023-04-07 LAB — LIPID PANEL
Cholesterol, Total: 198 mg/dL (ref 100–199)
HDL: 47 mg/dL (ref >39)
LDL CALC COMMENT:: 4.2 ratio (ref 0.0–4.4)
LDL Chol Calc (NIH): 119 mg/dL — ABNORMAL HIGH (ref 0–99)
Triglycerides: 179 mg/dL — ABNORMAL HIGH (ref 0–149)
VLDL Cholesterol Cal: 32 mg/dL (ref 5–40)

## 2023-04-07 LAB — CMP14+EGFR
ALT: 23 IU/L (ref 0–32)
AST: 23 IU/L (ref 0–40)
Albumin: 4.1 g/dL (ref 3.8–4.8)
Alkaline Phosphatase: 81 IU/L (ref 44–121)
BUN/Creatinine Ratio: 19 (ref 12–28)
BUN: 16 mg/dL (ref 8–27)
Bilirubin Total: 0.4 mg/dL (ref 0.0–1.2)
CO2: 25 mmol/L (ref 20–29)
Calcium: 9.8 mg/dL (ref 8.7–10.3)
Chloride: 102 mmol/L (ref 96–106)
Creatinine, Ser: 0.86 mg/dL (ref 0.57–1.00)
Globulin, Total: 2.9 g/dL (ref 1.5–4.5)
Glucose: 84 mg/dL (ref 70–99)
Potassium: 3.8 mmol/L (ref 3.5–5.2)
Sodium: 141 mmol/L (ref 134–144)
Total Protein: 7 g/dL (ref 6.0–8.5)
eGFR: 72 mL/min/{1.73_m2} (ref >59)

## 2023-04-07 LAB — CBC WITH DIFFERENTIAL/PLATELET
Basophils Absolute: 0 10*3/uL (ref 0.0–0.2)
Basos: 0 %
EOS (ABSOLUTE): 0.2 10*3/uL (ref 0.0–0.4)
Eos: 3 %
Hematocrit: 40.8 % (ref 34.0–46.6)
Hemoglobin: 13 g/dL (ref 11.1–15.9)
Immature Grans (Abs): 0 10*3/uL (ref 0.0–0.1)
Immature Granulocytes: 0 %
Lymphocytes Absolute: 2.4 10*3/uL (ref 0.7–3.1)
Lymphs: 42 %
MCH: 28.6 pg (ref 26.6–33.0)
MCHC: 31.9 g/dL (ref 31.5–35.7)
MCV: 90 fL (ref 79–97)
Monocytes Absolute: 0.7 10*3/uL (ref 0.1–0.9)
Monocytes: 12 %
Neutrophils Absolute: 2.4 10*3/uL (ref 1.4–7.0)
Neutrophils: 43 %
Platelets: 309 10*3/uL (ref 150–450)
RBC: 4.54 x10E6/uL (ref 3.77–5.28)
RDW: 12.7 % (ref 11.7–15.4)
WBC: 5.7 10*3/uL (ref 3.4–10.8)

## 2023-07-07 ENCOUNTER — Other Ambulatory Visit (HOSPITAL_COMMUNITY): Payer: Self-pay | Admitting: Hematology

## 2023-07-07 DIAGNOSIS — Z1231 Encounter for screening mammogram for malignant neoplasm of breast: Secondary | ICD-10-CM

## 2023-07-26 ENCOUNTER — Ambulatory Visit (HOSPITAL_COMMUNITY)
Admission: RE | Admit: 2023-07-26 | Discharge: 2023-07-26 | Disposition: A | Discharge status: Home / Self Care | Source: Ambulatory Visit | Attending: Hematology | Admitting: Hematology

## 2023-07-26 DIAGNOSIS — Z1231 Encounter for screening mammogram for malignant neoplasm of breast: Secondary | ICD-10-CM | POA: Insufficient documentation

## 2023-09-29 ENCOUNTER — Other Ambulatory Visit: Payer: Self-pay | Admitting: Nurse Practitioner

## 2023-09-29 DIAGNOSIS — I471 Supraventricular tachycardia, unspecified: Secondary | ICD-10-CM

## 2023-10-05 ENCOUNTER — Ambulatory Visit: Admitting: Nurse Practitioner

## 2023-10-05 ENCOUNTER — Encounter: Payer: Self-pay | Admitting: Nurse Practitioner

## 2023-10-05 VITALS — BP 129/69 | HR 50 | Temp 97.5°F | Ht 67.0 in | Wt 242.8 lb

## 2023-10-05 DIAGNOSIS — Z7901 Long term (current) use of anticoagulants: Secondary | ICD-10-CM | POA: Diagnosis not present

## 2023-10-05 DIAGNOSIS — Z0000 Encounter for general adult medical examination without abnormal findings: Secondary | ICD-10-CM

## 2023-10-05 DIAGNOSIS — I10 Essential (primary) hypertension: Secondary | ICD-10-CM | POA: Diagnosis not present

## 2023-10-05 DIAGNOSIS — D508 Other iron deficiency anemias: Secondary | ICD-10-CM | POA: Diagnosis not present

## 2023-10-05 DIAGNOSIS — I471 Supraventricular tachycardia, unspecified: Secondary | ICD-10-CM

## 2023-10-05 DIAGNOSIS — Z0001 Encounter for general adult medical examination with abnormal findings: Secondary | ICD-10-CM

## 2023-10-05 DIAGNOSIS — I82522 Chronic embolism and thrombosis of left iliac vein: Secondary | ICD-10-CM

## 2023-10-05 DIAGNOSIS — D510 Vitamin B12 deficiency anemia due to intrinsic factor deficiency: Secondary | ICD-10-CM

## 2023-10-05 DIAGNOSIS — K219 Gastro-esophageal reflux disease without esophagitis: Secondary | ICD-10-CM

## 2023-10-05 DIAGNOSIS — C49A3 Gastrointestinal stromal tumor of small intestine: Secondary | ICD-10-CM

## 2023-10-05 DIAGNOSIS — F3341 Major depressive disorder, recurrent, in partial remission: Secondary | ICD-10-CM

## 2023-10-05 LAB — LIPID PANEL

## 2023-10-05 MED ORDER — RIVAROXABAN 20 MG PO TABS: 20.0000 mg | ORAL_TABLET | Freq: Every day | ORAL | 5 refills | Status: DC

## 2023-10-05 MED ORDER — LOSARTAN POTASSIUM-HCTZ 100-25 MG PO TABS: 1.0000 | ORAL_TABLET | Freq: Every day | ORAL | 1 refills | Status: AC

## 2023-10-05 MED ORDER — OMEPRAZOLE 20 MG PO CPDR: 20.0000 mg | DELAYED_RELEASE_CAPSULE | Freq: Every day | ORAL | 1 refills | Status: AC

## 2023-10-05 MED ORDER — METOPROLOL TARTRATE 25 MG PO TABS: 12.5000 mg | ORAL_TABLET | Freq: Two times a day (BID) | ORAL | 1 refills | Status: AC

## 2023-10-05 NOTE — Patient Instructions (Signed)
 Exercising to Stay Healthy To become healthy and stay healthy, it is recommended that you do moderate-intensity and vigorous-intensity exercise. You can tell that you are exercising at a moderate intensity if your heart starts beating faster and you start breathing faster but can still hold a conversation. You can tell that you are exercising at a vigorous intensity if you are breathing much harder and faster and cannot hold a conversation while exercising. How can exercise benefit me? Exercising regularly is important. It has many health benefits, such as: Improving overall fitness, flexibility, and endurance. Increasing bone density. Helping with weight control. Decreasing body fat. Increasing muscle strength and endurance. Reducing stress and tension, anxiety, depression, or anger. Improving overall health. What guidelines should I follow while exercising? Before you start a new exercise program, talk with your health care provider. Do not exercise so much that you hurt yourself, feel dizzy, or get very short of breath. Wear comfortable clothes and wear shoes with good support. Drink plenty of water while you exercise to prevent dehydration or heat stroke. Work out until your breathing and your heartbeat get faster (moderate intensity). How often should I exercise? Choose an activity that you enjoy, and set realistic goals. Your health care provider can help you make an activity plan that is individually designed and works best for you. Exercise regularly as told by your health care provider. This may include: Doing strength training two times a week, such as: Lifting weights. Using resistance bands. Push-ups. Sit-ups. Yoga. Doing a certain intensity of exercise for a given amount of time. Choose from these options: A total of 150 minutes of moderate-intensity exercise every week. A total of 75 minutes of vigorous-intensity exercise every week. A mix of moderate-intensity and  vigorous-intensity exercise every week. Children, pregnant women, people who have not exercised regularly, people who are overweight, and older adults may need to talk with a health care provider about what activities are safe to perform. If you have a medical condition, be sure to talk with your health care provider before you start a new exercise program. What are some exercise ideas? Moderate-intensity exercise ideas include: Walking 1 mile (1.6 km) in about 15 minutes. Biking. Hiking. Golfing. Dancing. Water aerobics. Vigorous-intensity exercise ideas include: Walking 4.5 miles (7.2 km) or more in about 1 hour. Jogging or running 5 miles (8 km) in about 1 hour. Biking 10 miles (16.1 km) or more in about 1 hour. Lap swimming. Roller-skating or in-line skating. Cross-country skiing. Vigorous competitive sports, such as football, basketball, and soccer. Jumping rope. Aerobic dancing. What are some everyday activities that can help me get exercise? Yard work, such as: Child psychotherapist. Raking and bagging leaves. Washing your car. Pushing a stroller. Shoveling snow. Gardening. Washing windows or floors. How can I be more active in my day-to-day activities? Use stairs instead of an elevator. Take a walk during your lunch break. If you drive, park your car farther away from your work or school. If you take public transportation, get off one stop early and walk the rest of the way. Stand up or walk around during all of your indoor phone calls. Get up, stretch, and walk around every 30 minutes throughout the day. Enjoy exercise with a friend. Support to continue exercising will help you keep a regular routine of activity. Where to find more information You can find more information about exercising to stay healthy from: U.[place]. Department of Health and Human Services: ThisPath.fi Centers for Disease Control and Prevention (  CDC): FootballExhibition.com.br Summary Exercising regularly is  important. It will improve your overall fitness, flexibility, and endurance. Regular exercise will also improve your overall health. It can help you control your weight, reduce stress, and improve your bone density. Do not exercise so much that you hurt yourself, feel dizzy, or get very short of breath. Before you start a new exercise program, talk with your health care provider. This information is not intended to replace advice given to you by your health care provider. Make sure you discuss any questions you have with your health care provider. Document Revised: 05/10/2020 Document Reviewed: 05/10/2020 Elsevier Patient Education  2024 [employer].

## 2023-10-05 NOTE — Progress Notes (Signed)
 Subjective:    Patient ID: [name], female    DOB: 18-Feb-1950, 73 y.o.   MRN: [id number]   Chief Complaint: annual physical    HPI:  [name] is a 73 y.o. who identifies as a female who was assigned female at birth.   Social history: Lives with: husband Work history: retired   Water engineer in today for follow up of the following chronic medical issues:  1. Primary hypertension No c/o chest pain, sob or headache. Does not check blood pressure at home. BP Readings from Last 3 Encounters:  04/06/23 116/68  01/01/23 (!) 148/95  10/05/22 108/69     2. SVT (supraventricular tachycardia) No c/o of palpitations or heart racing  3. Chronic deep vein thrombosis (DVT) of iliac vein of left lower extremity ([name]) 4. Chronic anticoagulation Is on xeralto without any bleeding issues  5. Gastroesophageal reflux disease without esophagitis Is on omeprazole  daily to prevent symptoms  6. Other iron  deficiency anemia Lab Results  Component Value Date   HGB 13.0 04/06/2023     7. Recurrent major depressive disorder, in partial remission ([name]) Is not on antidepressant . Says she is currently doing well.    10/05/2023    8:37 AM 04/06/2023    8:24 AM 01/01/2023    4:16 PM  Depression screen PHQ 2/9  Decreased Interest 0 0 0  Down, Depressed, Hopeless 0 0 0  PHQ - 2 Score 0 0 0  Altered sleeping 0 0 0  Tired, decreased energy 2 2 0  Change in appetite 0 0 0  Feeling bad or failure about yourself  0 0 0  Trouble concentrating 0 0 0  Moving slowly or fidgety/restless 0 0 0  Suicidal thoughts 0 0 0  PHQ-9 Score 2 2 0  Difficult doing work/chores Not difficult at all Not difficult at all Not difficult at all        8. Pernicious anemia Is on monthy b12 injections. Lab Results  Component Value Date   VITAMINB12 559 12/16/2022     9. Morbid obesity ([name]) Weight is unchanged  Wt Readings from Last 3 Encounters:  10/05/23 242 lb 12.8 oz (110.1 kg)  04/06/23  244 lb (110.7 kg)  01/01/23 238 lb (108 kg)   BMI Readings from Last 3 Encounters:  10/05/23 38.03 kg/m  04/06/23 38.22 kg/m  01/01/23 37.28 kg/m         New complaints: None today  Allergies  Allergen Reactions   Bee Venom Swelling and Rash    SWELLING REACTION UNSPECIFIED  SWELLING REACTION UNSPECIFIED    Cephalexin Swelling    PATIENT WITH Rx OF ANAPHYLAXIS TO PCN's SWELLING REACTION UNSPECIFIED  PATIENT WITH Rx OF ANAPHYLAXIS TO PCN's SWELLING REACTION UNSPECIFIED    Dexlansoprazole Swelling    SWELLING REACTION UNSPECIFIED  SWELLING REACTION UNSPECIFIED    Imatinib  Other (See Comments)    Cardiac dysrhythmia   Other Swelling    PECANS MOUTH SWELLS   Penicillins Anaphylaxis    Has patient had a PCN reaction causing immediate rash, facial/tongue/throat swelling, SOB or lightheadedness with hypotension: No no Has patient had a PCN reaction causing severe rash involving mucus membranes or skin necrosis: No Has patient had a PCN reaction that required hospitalization No Has patient had a PCN reaction occurring within the last 10 years: No If all of the above answers are NO, then may proceed with Cephalosporin use.    Ace Inhibitors Other (See Comments)    angioedema  Doxycycline  Hyclate    Latex Itching   Nylon Rash   Sulfa Antibiotics Rash   Sulfonamide Derivatives Rash   Tape Itching    Paper tape is ok Paper tape is ok   Outpatient Encounter Medications as of 10/05/2023  Medication Sig   acetaminophen  (TYLENOL ) 500 MG tablet Take 500-1,000 mg by mouth every 6 (six) hours as needed for moderate pain.    cyanocobalamin  (VITAMIN B12) 1000 MCG/ML injection INJECT 1ML IM EVERY 30 DAYS   diphenhydrAMINE  (BENADRYL ) 25 mg capsule Take 25 mg by mouth every 6 (six) hours as needed for allergies (bee stings).    losartan -hydrochlorothiazide (HYZAAR) 100-25 MG tablet Take 1 tablet by mouth daily.   meclizine  (ANTIVERT ) 25 MG tablet Take 1 tablet (25 mg total) by  mouth 3 (three) times daily as needed for dizziness.   metoprolol  tartrate (LOPRESSOR ) 25 MG tablet Take 0.5 tablets (12.5 mg total) by mouth 2 (two) times daily. (Needs to be seen before next refill)   omeprazole  (PRILOSEC) 20 MG capsule Take 1 capsule (20 mg total) by mouth daily.   Probiotic Product (PROBIOTIC PO) Take by mouth.   rivaroxaban  (XARELTO ) 20 MG TABS tablet TAKE 1 TABLET BY MOUTH DAILY WITH SUPPER   No facility-administered encounter medications on file as of 10/05/2023.    Past Surgical History:  Procedure Laterality Date   ABDOMINAL HYSTERECTOMY  1989   BALLOON DILATION  12/11/2010   Procedure: BALLOON DILATION;  Surgeon: Claudis RAYMOND Rivet, MD;  Location: [facility];  Service: Endoscopy;  Laterality: N/A;   BOWEL RESECTION  01/05/2011   Procedure: SMALL BOWEL RESECTION;  Surgeon: Oneil DELENA Budge;  Location: [facility];  Service: General;;  Partial Small Bowel Resection   COLONOSCOPY  02/25/2012   Procedure: COLONOSCOPY;  Surgeon: Claudis RAYMOND Rivet, MD;  Location: [facility];  Service: Endoscopy;  Laterality: N/A;  1200   GIVENS CAPSULE STUDY  01/02/2011   Procedure: GIVENS CAPSULE STUDY;  Surgeon: Claudis RAYMOND Rivet, MD;  Location: [facility];  Service: Endoscopy;  Laterality: N/A;   LAPAROTOMY  01/05/2011   Procedure: EXPLORATORY LAPAROTOMY;  Surgeon: Oneil DELENA Budge;  Location: [facility];  Service: General;  Laterality: N/A;   OPEN REDUCTION INTERNAL FIXATION (ORIF) SCAPHOID WITH DISTAL RADIUS GRAFT Right 02/01/2016   Procedure: Right distal radius open reduction and internal fixation and repair as indicated;  Surgeon: Prentice Pagan, MD;  Location: [facility];  Service: Orthopedics;  Laterality: Right;  Requests 90 mins    Family History  Problem Relation Age of Onset   Depression Mother    Parkinson's disease Mother    Allergic rhinitis Brother    Angioedema Neg Hx    Asthma Neg Hx    Eczema Neg Hx    Immunodeficiency Neg Hx    Urticaria Neg Hx       Controlled substance  contract: n/a     Review of Systems  Constitutional:  Negative for diaphoresis.  Eyes:  Negative for pain.  Respiratory:  Negative for shortness of breath.   Cardiovascular:  Negative for chest pain, palpitations and leg swelling.  Gastrointestinal:  Negative for abdominal pain.  Endocrine: Negative for polydipsia.  Skin:  Negative for rash.  Neurological:  Negative for dizziness, weakness and headaches.  Hematological:  Does not bruise/bleed easily.  All other systems reviewed and are negative.      Objective:   Physical Exam Vitals and nursing note reviewed.  Constitutional:      General: She is  not in acute distress.    Appearance: Normal appearance. She is well-developed.  HENT:     Head: Normocephalic.     Right Ear: Tympanic membrane normal.     Left Ear: Tympanic membrane normal.     Nose: Nose normal.     Mouth/Throat:     Mouth: Mucous membranes are moist.  Eyes:     Pupils: Pupils are equal, round, and reactive to light.  Neck:     Vascular: No carotid bruit or JVD.  Cardiovascular:     Rate and Rhythm: Normal rate and regular rhythm.     Heart sounds: Normal heart sounds.  Pulmonary:     Effort: Pulmonary effort is normal. No respiratory distress.     Breath sounds: Normal breath sounds. No wheezing or rales.  Chest:     Chest wall: No tenderness.  Abdominal:     General: Bowel sounds are normal. There is no distension or abdominal bruit.     Palpations: Abdomen is soft. There is no hepatomegaly, splenomegaly, mass or pulsatile mass.     Tenderness: There is no abdominal tenderness.     Hernia: A hernia (ventral hernia) is present.  Musculoskeletal:        General: Normal range of motion.     Cervical back: Normal range of motion and neck supple.  Lymphadenopathy:     Cervical: No cervical adenopathy.  Skin:    General: Skin is warm and dry.  Neurological:     Mental Status: She is alert and oriented to person, place, and time.     Deep Tendon  Reflexes: Reflexes are normal and symmetric.  Psychiatric:        Behavior: Behavior normal.        Thought Content: Thought content normal.        Judgment: Judgment normal.    BP 129/69   Pulse (!) 50   Temp (!) 97.5 F (36.4 C)   Ht 5' 7 (1.702 m)   Wt 242 lb 12.8 oz (110.1 kg)   SpO2 98%   BMI 38.03 kg/m           Assessment & Plan:   LARIZA COTHRON comes in today with chief complaint of annual physical  Diagnosis and orders addressed:  1. Primary hypertension Low sodium diet - losartan -hydrochlorothiazide (HYZAAR) 100-25 MG tablet; Take 1 tablet by mouth daily.  Dispense: 90 tablet; Refill: 1 - CBC with Differential/Platelet - CMP14+EGFR - Lipid panel  2. SVT (supraventricular tachycardia) Avoid caffeine - metoprolol  tartrate (LOPRESSOR ) 25 MG tablet; Take 0.5 tablets (12.5 mg total) by mouth 2 (two) times daily. (Needs to be seen before next refill)  Dispense: 180 tablet; Refill: 1 - rivaroxaban  (XARELTO ) 20 MG TABS tablet; Take 1 tablet (20 mg total) by mouth daily with supper.  Dispense: 30 tablet; Refill: 5  3. Chronic deep vein thrombosis (DVT) of iliac vein of left lower extremity ([name]) Report any bleeding issue with xeralto  4. Chronic anticoagulation Report any bleeding issues 5. Gastroesophageal reflux disease without esophagitis Avoid spicy foods Do not eat 2 hours prior to bedtime - omeprazole  (PRILOSEC) 20 MG capsule; Take 1 capsule (20 mg total) by mouth daily.  Dispense: 90 capsule; Refill: 1  6. Other iron  deficiency anemia Labs pending  7. Recurrent major depressive disorder, in partial remission ([name]) Stress management  8. Pernicious anemia - cyanocobalamin  (VITAMIN B12) 1000 MCG/ML injection; INJECT 1ML IM EVERY 30 DAYS  Dispense: 1 mL; Refill: 11  9.  Morbid obesity ([name]) Discussed diet and exercise for person with BMI >25 Will recheck weight in 3-6 months    Labs pending Health Maintenance reviewed Diet and exercise  encouraged  Follow up plan: 6 months   [name] Gladis, FNP

## 2023-10-05 NOTE — Addendum Note (Signed)
 Addended by: Taiquan Campanaro, [name] on: 10/05/2023 08:59 AM   Modules accepted: Orders

## 2023-10-06 LAB — CBC WITH DIFFERENTIAL/PLATELET
Basophils Absolute: 0 x10E3/uL (ref 0.0–0.2)
Basos: 0 %
EOS (ABSOLUTE): 0.1 x10E3/uL (ref 0.0–0.4)
Eos: 2 %
Hematocrit: 44.4 % (ref 34.0–46.6)
Hemoglobin: 14.3 g/dL (ref 11.1–15.9)
Immature Grans (Abs): 0 x10E3/uL (ref 0.0–0.1)
Immature Granulocytes: 0 %
Lymphocytes Absolute: 1.9 x10E3/uL (ref 0.7–3.1)
Lymphs: 36 %
MCH: 28.9 pg (ref 26.6–33.0)
MCHC: 32.2 g/dL (ref 31.5–35.7)
MCV: 90 fL (ref 79–97)
Monocytes Absolute: 0.7 x10E3/uL (ref 0.1–0.9)
Monocytes: 12 %
Neutrophils Absolute: 2.6 x10E3/uL (ref 1.4–7.0)
Neutrophils: 50 %
Platelets: 323 x10E3/uL (ref 150–450)
RBC: 4.95 x10E6/uL (ref 3.77–5.28)
RDW: 13.1 % (ref 11.7–15.4)
WBC: 5.3 x10E3/uL (ref 3.4–10.8)

## 2023-10-06 LAB — VITAMIN B12: Vitamin B-12: 885 pg/mL (ref 232–1245)

## 2023-10-06 LAB — LIPID PANEL
Cholesterol, Total: 221 mg/dL — AB (ref 100–199)
HDL: 45 mg/dL (ref >39)
LDL CALC COMMENT:: 4.9 ratio — AB (ref 0.0–4.4)
LDL Chol Calc (NIH): 142 mg/dL — AB (ref 0–99)
Triglycerides: 188 mg/dL — AB (ref 0–149)
VLDL Cholesterol Cal: 34 mg/dL (ref 5–40)

## 2023-10-06 LAB — THYROID PANEL WITH TSH
Free Thyroxine Index: 2.3 (ref 1.2–4.9)
T3 Uptake Ratio: 25 (ref 24–39)
T4, Total: 9 ug/dL (ref 4.5–12.0)
TSH: 1.58 u[IU]/mL (ref 0.450–4.500)

## 2023-10-06 LAB — CMP14+EGFR
ALT: 23 IU/L (ref 0–32)
AST: 22 IU/L (ref 0–40)
Albumin: 4.3 g/dL (ref 3.8–4.8)
Alkaline Phosphatase: 85 IU/L (ref 44–121)
BUN/Creatinine Ratio: 17 (ref 12–28)
BUN: 15 mg/dL (ref 8–27)
Bilirubin Total: 0.4 mg/dL (ref 0.0–1.2)
CO2: 22 mmol/L (ref 20–29)
Calcium: 9.8 mg/dL (ref 8.7–10.3)
Chloride: 102 mmol/L (ref 96–106)
Creatinine, Ser: 0.86 mg/dL (ref 0.57–1.00)
Globulin, Total: 3.1 g/dL (ref 1.5–4.5)
Glucose: 98 mg/dL (ref 70–99)
Potassium: 4.6 mmol/L (ref 3.5–5.2)
Sodium: 140 mmol/L (ref 134–144)
Total Protein: 7.4 g/dL (ref 6.0–8.5)
eGFR: 72 mL/min/1.73 (ref >59)

## 2023-10-06 LAB — VITAMIN D 25 HYDROXY (VIT D DEFICIENCY, FRACTURES): Vit D, 25-Hydroxy: 34.3 ng/mL (ref 30.0–100.0)

## 2023-10-07 ENCOUNTER — Ambulatory Visit: Payer: Self-pay | Admitting: Nurse Practitioner

## 2023-10-08 NOTE — Telephone Encounter (Signed)
 Copied from CRM #[id number]. Topic: Clinical - Medication Question >> Oct 08, 2023  3:06 PM Carlatta H wrote: Reason for CRM: Patient would like to know about cholesterol and vitamin D  levels//Please call patient to advise about medications

## 2023-10-11 ENCOUNTER — Telehealth: Payer: Self-pay

## 2023-10-11 ENCOUNTER — Telehealth: Payer: Self-pay | Admitting: Family Medicine

## 2023-10-11 MED ORDER — ATORVASTATIN CALCIUM 40 MG PO TABS
40.0000 mg | ORAL_TABLET | Freq: Every day | ORAL | 1 refills | Status: DC
Start: 2023-10-11 — End: 2023-11-16

## 2023-10-11 NOTE — Telephone Encounter (Signed)
 Already spoke to patient about this this morning.

## 2023-10-11 NOTE — Telephone Encounter (Signed)
 Patient called back about lab results and starting a statin. She is ok with starting one. Please send to [facility] in [place]

## 2023-10-11 NOTE — Telephone Encounter (Signed)
 Copied from CRM #[id number]. Topic: Clinical - Medication Question >> Oct 08, 2023  3:06 PM Carlatta H wrote: Reason for CRM: Patient would like to know about cholesterol and vitamin D  levels//Please call patient to advise about medications >> Oct 11, 2023  8:08 AM Miquel SAILOR wrote: Patient returning office call from [place]. Called office transferred

## 2023-10-11 NOTE — Telephone Encounter (Signed)
 Copied from CRM #[id number]. Topic: Clinical - Medication Question >> Oct 08, 2023  3:06 PM Carlatta H wrote: Patient aware of labs!!  Reason for CRM: Patient would like to know about cholesterol and vitamin D  levels//Please call patient to advise about medications >> Oct 11, 2023  8:08 AM Miquel SAILOR wrote: Patient returning office call from [place]. Called office transferred

## 2023-10-20 ENCOUNTER — Ambulatory Visit

## 2023-10-21 DIAGNOSIS — H2513 Age-related nuclear cataract, bilateral: Secondary | ICD-10-CM | POA: Diagnosis not present

## 2023-10-21 DIAGNOSIS — H40033 Anatomical narrow angle, bilateral: Secondary | ICD-10-CM | POA: Diagnosis not present

## 2023-10-22 ENCOUNTER — Ambulatory Visit: Payer: Self-pay

## 2023-10-22 NOTE — Telephone Encounter (Signed)
 Patient already spoke with triage nurse about symptoms and advise was given. Will close this encounter.

## 2023-10-22 NOTE — Telephone Encounter (Signed)
 FYI Only or Action Required?: Action required by provider: clinical question for provider.  Patient was last seen in primary care on 10/05/2023 by [name], FNP.  Called Nurse Triage reporting Generalized Body Aches and Heart Problem.  Symptoms began a week ago.  Interventions attempted: Rest, hydration, or home remedies and Other: patient stopped taking atorvastatin  last night.  Symptoms are: heart flutter resolved and joint aches slightly better.  Triage Disposition: See HCP Within 4 Hours (Or PCP Triage), Call PCP When Office is Open  Patient/caregiver understands and will follow disposition?: No, wishes to speak with PCP            Copied from CRM #[id number]. Topic: Clinical - Red Word Triage >> Oct 22, 2023  9:38 AM [name] wrote: Red Word that prompted transfer to Nurse Triage: Patient is taking atorvastatin  (LIPITOR) 40 MG tablet and states she needs a different medication because it is causing pain in her joints and feet. She stated she neeed a different statin. Pain level 8  Pharmacy: [facility] [place] [place] - [address] [place] [address] Phone: [phone] Fax: [phone] Hours: Not open 24 hours Reason for Disposition  Caller wants to use a complementary or alternative medicine  Age > 60 years  (Exception: Brief heartbeat symptoms that went away and now feels well.)  Answer Assessment - Initial Assessment Questions Medication Question: Started Atorvastatin  on 9/15 and patient started feeling achy in her joints last weekend She states that she stopped taking it last night. She states she feels slightly better today She advised that she was put on a statin last year that caused her to have similar joint achiness. She was wanting a different medication as per her provider's instructions.  Heart Rate/Heart Beat Questions: Patient mentioned that on Sunday (5 days ago) she felt what she described as a brief  flutter in her chest. She states it has been several months since she has felt that and she has felt it before.  She denies any symptoms right now other than aching in her joints that has been going for almost a week now. Patient is advised that with a brief symptoms with her heart an office visit today would be recommended. Patient wants to know what her provider recommends with this first before coming in for an appointment. She is also advised to let her Cardiologist know about this brief episode and she is also advised to call us  back with any changes and if anything gets worse to go to the Emergency Room. Patient verbalized understanding.  Answer Assessment - Initial Assessment Questions Started Atorvastatin  on 9/15 and patient started feeling achy in her joints last weekend She states that she stopped taking it last night. She states she feels slightly better today She advised that she was put on a statin last year that caused her to have similar joint achiness. She was wanting a different medication as per her provider's instructions.  Protocols used: Medication Question Call-A-AH, Heart Rate and Heartbeat Questions-A-AH

## 2023-10-22 NOTE — Telephone Encounter (Signed)
 This RN called [facility] to advise them of patient's symptoms and wanting her PCP to advise further

## 2023-10-26 ENCOUNTER — Telehealth: Payer: Self-pay

## 2023-10-26 NOTE — Telephone Encounter (Signed)
 Will send to [facility], [facility].

## 2023-10-26 NOTE — Telephone Encounter (Signed)
 Copied from CRM #[id number]. Topic: Clinical - Prescription Issue >> Oct 26, 2023  2:36 PM Nathanel BROCKS wrote: Reason for CRM: pt called back today and stated that she has been on a Statin cholestrol medication. Neither of them she can tolerate. She is requesting that she be put on a cholestrol medication but one that is not a statin. Please advise and call pt to discuss.

## 2023-10-26 NOTE — Telephone Encounter (Signed)
 Please call and make patient an appointment with [facility] to discuss options

## 2023-10-27 ENCOUNTER — Telehealth: Payer: Self-pay

## 2023-10-27 NOTE — Progress Notes (Signed)
 Care Guide Pharmacy Note  10/27/2023 Name: [name] MRN: [id number] DOB: 03-23-1950  Referred By: [name], FNP Reason for referral: Complex Care Management (Outreach to schedule with Pharm d )   [name] is a 72 y.o. year old female who is a primary care patient of [name], FNP.  [name] was referred to the pharmacist for assistance related to: HLD  Successful contact was made with the patient to discuss pharmacy services including being ready for the pharmacist to call at least 5 minutes before the scheduled appointment time and to have medication bottles and any blood pressure readings ready for review. The patient agreed to meet with the pharmacist via telephone visit on (date/time).11/17/2023  Jeoffrey Buffalo , RMA     [place]  [facility], [facility] Guide  Direct Dial: [phone]  Website: [email or web address]

## 2023-10-27 NOTE — Progress Notes (Signed)
 Care Guide Pharmacy Note  10/27/2023 Name: [name] MRN: [id number] DOB: [birth year]  Referred By: [name], FNP Reason for referral: Complex Care Management (Outreach to schedule with Pharm d )   [name] is a 73 y.o. year old female who is a primary care patient of [name], FNP.  [name] was referred to the pharmacist for assistance related to: HLD  An unsuccessful telephone outreach was attempted today to contact the patient who was referred to the pharmacy team for assistance with medication management. Additional attempts will be made to contact the patient.  Jeoffrey Buffalo , RMA     [facility]  [facility], [facility] Guide  Direct Dial: [phone]  Website: [email or web address]

## 2023-11-08 ENCOUNTER — Encounter: Payer: Self-pay | Admitting: Nurse Practitioner

## 2023-11-08 ENCOUNTER — Ambulatory Visit: Admitting: Nurse Practitioner

## 2023-11-08 VITALS — BP 137/68 | HR 54 | Temp 97.6°F | Ht 67.0 in | Wt 243.0 lb

## 2023-11-08 DIAGNOSIS — M67472 Ganglion, left ankle and foot: Secondary | ICD-10-CM

## 2023-11-08 MED ORDER — MELOXICAM 15 MG PO TABS: 15.0000 mg | ORAL_TABLET | Freq: Every day | ORAL | 0 refills | Status: DC

## 2023-11-08 NOTE — Patient Instructions (Signed)
 Removing a Pocket of Fluid at a Joint (Ganglion Cyst Removal): What to Expect A pocket of fluid near a joint is called a ganglion cyst. These cysts often form on joints in your hand or wrist. They can also form: On tendons, which are tissues that connect muscle to bone. On ligaments, which are tissues that connect bones to each other. On joints in your shoulder, elbow, hip, knee, ankle, or foot. You may need surgery to remove the cyst if other treatments haven't worked. Surgery may be done through a few small cuts (minimally invasive surgery) or through one larger cut (open surgery). It may be done if: The cyst is really big. The cyst hurts or makes it hard for you to move. The cyst pushes on a nerve. This can cause tingling or weakness. The cyst comes back after the fluid is drained out of it. Tell your health care provider about: Any allergies you have. All medicines you take. These include vitamins, herbs, eye drops, and creams. Any problems you or family members have had with anesthesia. Any bleeding problems you have. Any surgeries you've had. Any medical conditions you have. Whether you're pregnant or may be pregnant. What are the risks? Your health care provider will talk with you about risks. These may include: Infection. Bleeding. Damage to nearby structures. These include tendons, nerves, and blood vessels. Stiffness or not being able to move as well as you should. The cyst coming back. Allergic reactions to medicines. What happens before the surgery? When to stop eating and drinking Eat and drink only as you've been told. You may be told this: 8 hours before your surgery Stop eating most foods. Do not eat meat, fried foods, or fatty foods. Eat only light foods, such as toast or crackers. All liquids are OK except energy drinks and alcohol. 6 hours before your surgery Stop eating. Drink only clear liquids, such as water, clear fruit juice, black coffee, plain tea, and  sports drinks. Do not drink energy drinks or alcohol. 2 hours before your surgery Stop drinking all liquids. You may be allowed to take medicines with small sips of water. If you do not eat and drink as told, your surgery may be delayed or canceled. Medicines Ask about changing or stopping: Any medicines you take. Any vitamins, herbs, or supplements you take. Do not take aspirin or ibuprofen unless you're told to. Surgery safety For your safety, you may: Need to wash your skin with a soap that kills germs. Get antibiotics. Have your surgery site marked. Have hair removed at the surgery site. Tests You may have tests. These may include: An X-ray. An MRI. A CT scan. An ultrasound. General instructions Ask if you'll be staying overnight in the hospital. If you'll be going home right after the surgery, plan to have a responsible adult: Drive you home from the hospital or clinic. You won't be allowed to drive. Stay with you for the time you're told. What happens during the surgery?  An IV will be put into a vein in your hand or arm. You may be given: A sedative to help you relax. Anesthesia to keep you from feeling pain. If you're having minimally invasive surgery: Two or more small cuts will be made near the cyst. A thin tube called an arthroscope with a camera on the end will be put through one of the cuts. Tools will be put through the other cuts. Pictures will be sent to a screen in the room. These will be  used to guide the surgery. If you're having open surgery, a cut will be made to take out the cyst. All parts of the cyst will be taken out. These include: The fluid. The cyst wall. The stalk that connects the cyst to the tissue. In some cases, a piece of joint tissue, tendon, or ligament may also be taken out. Cuts will be closed with stitches, skin glue, or tape strips. A bandage will be placed. These steps may vary. Ask what you can expect. What happens after the  surgery? You will be watched closely until you leave. This includes checking your pain level, blood pressure, heart rate, and breathing rate. You may have to wear a splint or brace for a few days. If you were given a sedative, do not drive or use machines until you're told it's safe. A sedative can make you sleepy. Where to find more information American Society for [employer] of the Hand (ASSH): assh.org This information is not intended to replace advice given to you by your health care provider. Make sure you discuss any questions you have with your health care provider. Document Revised: 07/10/2022 Document Reviewed: 07/10/2022 Elsevier Patient Education  2024 [employer].

## 2023-11-08 NOTE — Progress Notes (Signed)
   Subjective:    Patient ID: [name], female    DOB: 09/06/1950, 73 y.o.   MRN: [id number]   Chief Complaint: Painful knot on left foot (Started 1 1/2 weeks ago)   HPI  Patient comes in today c/o knot on side of left foot. Noticed it about 1 1/2 weeks ago. Hurts when walking Patient Active Problem List   Diagnosis Date Noted   Pain in left knee 12/05/2020   Primary hypertension 11/11/2020   Morbid obesity ([name]) 11/11/2020   Gastroesophageal reflux disease without esophagitis 11/11/2020   Unilateral primary osteoarthritis, left knee 05/26/2017   Depression 11/19/2014   SVT (supraventricular tachycardia) 03/25/2011   GIST (gastrointestinal stromal tumor), malignant ([name]) 01/03/2011   Chronic ulcer of left leg ([name]) 01/01/2011   Chronic anticoagulation 01/01/2011   Factor V Leiden    DVT (deep venous thrombosis) ([name]) 07/10/2010   Pernicious anemia 07/10/2010   Iron  deficiency anemia 08/27/2009       Review of Systems  Constitutional:  Negative for diaphoresis.  Eyes:  Negative for pain.  Respiratory:  Negative for shortness of breath.   Cardiovascular:  Negative for chest pain, palpitations and leg swelling.  Gastrointestinal:  Negative for abdominal pain.  Endocrine: Negative for polydipsia.  Skin:  Negative for rash.  Neurological:  Negative for dizziness, weakness and headaches.  Hematological:  Does not bruise/bleed easily.  All other systems reviewed and are negative.      Objective:   Physical Exam Constitutional:      Appearance: Normal appearance. She is obese.  Cardiovascular:     Rate and Rhythm: Normal rate and regular rhythm.     Heart sounds: Normal heart sounds.  Pulmonary:     Effort: Pulmonary effort is normal.     Breath sounds: Normal breath sounds.  Musculoskeletal:     Comments: Soft 6cm fluid filled nodule on left foot. Mildly tender to touch.  Skin:    General: Skin is warm.  Neurological:     General: No focal deficit present.      Mental Status: She is alert and oriented to person, place, and time.  Psychiatric:        Mood and Affect: Mood normal.        Behavior: Behavior normal.    BP 137/68   Pulse (!) 54   Temp 97.6 F (36.4 C) (Temporal)   Ht 5' 7 (1.702 m)   Wt 243 lb (110.2 kg)   SpO2 97%   BMI 38.06 kg/m         Assessment & Plan:  [name] in today with chief complaint of Painful knot on left foot (Started 1 1/2 weeks ago)   1. Ganglion cyst of left foot (Primary) Ice as needed Rto prn - meloxicam (MOBIC) 15 MG tablet; Take 1 tablet (15 mg total) by mouth daily.  Dispense: 30 tablet; Refill: 0    The above assessment and management plan was discussed with the patient. The patient verbalized understanding of and has agreed to the management plan. Patient is aware to call the clinic if symptoms persist or worsen. Patient is aware when to return to the clinic for a follow-up visit. Patient educated on when it is appropriate to go to the emergency department.   [name] Gladis, FNP

## 2023-11-16 ENCOUNTER — Telehealth: Payer: Self-pay | Admitting: Pharmacist

## 2023-11-16 DIAGNOSIS — Z0000 Encounter for general adult medical examination without abnormal findings: Secondary | ICD-10-CM

## 2023-11-16 DIAGNOSIS — E7800 Pure hypercholesterolemia, unspecified: Secondary | ICD-10-CM

## 2023-11-16 MED ORDER — EZETIMIBE 10 MG PO TABS: 10.0000 mg | ORAL_TABLET | Freq: Every day | ORAL | 3 refills | Status: AC

## 2023-11-16 NOTE — Telephone Encounter (Signed)
 11/16/2023 Name: [name] MRN: [id number] DOB: 1950/03/24  Chief Complaint  Patient presents with   Medication Management    hyperlipidemia    [name] is a 73 y.o. year old female who presented for a telephone visit.   They were referred to the pharmacist by their PCP for assistance in managing hyperlipidemia/cardiovascular risk reduction.    Subjective:  Care Team: Primary Care Provider: [name], Macdonald ; Next Scheduled Visit: 03/2024   Medication Access/Adherence  Current Pharmacy:  [facility] - [place], [place] - [address] [place] [address] Phone: [phone] Fax: [phone]   Patient reports affordability concerns with their medications: No  Patient reports access/transportation concerns to their pharmacy: No  Patient reports adherence concerns with their medications:  Yes  statins   Hyperlipidemia/ASCVD Risk Reduction  Current lipid lowering medications:  Medications tried in the past: rosuvastatin , atorvastatin  Started Atorvastatin  on 9/15 and patient started feeling achy in her joints last weekend She states that she stopped taking it last night. She states she feels slightly better today She advised that she was put on a statin last year that caused her to have similar joint achiness. She was wanting a different medication as per her provider's instructions.   Antiplatelet regimen:   ASCVD History:  none Family History: none Risk Factors:   Current physical activity: factor V--legs are hurting/limited; current Left foot injury/potentially broken  Current medication access support: n/a   Objective:  Lab Results  Component Value Date   HGBA1C 5.4 01/01/2023    Lab Results  Component Value Date   CREATININE 0.86 10/05/2023   BUN 15 10/05/2023   NA 140 10/05/2023   K 4.6 10/05/2023   CL 102 10/05/2023   CO2 22 10/05/2023    Lab Results  Component Value Date   CHOL 221 (H)  10/05/2023   HDL 45 10/05/2023   LDLCALC 142 (H) 10/05/2023   TRIG 188 (H) 10/05/2023   CHOLHDL 4.9 (H) 10/05/2023    Medications Reviewed Today     Reviewed by [name], [facility] (Pharmacist) on 11/16/23 at 1638  Med List Status: <None>   Medication Order Taking? Sig Documenting Provider Last Dose Status Informant  acetaminophen  (TYLENOL ) 500 MG tablet [id number]  Take 500-1,000 mg by mouth every 6 (six) hours as needed for moderate pain.  [provider]  Active Self   Patient not taking:   Discontinued 11/16/23 1636 (Change in therapy)   cyanocobalamin  (VITAMIN B12) 1000 MCG/ML injection [id number]  INJECT 1ML IM EVERY 30 DAYS Denise, [name], Macdonald  Active   diphenhydrAMINE  (BENADRYL ) 25 mg capsule [id number]  Take 25 mg by mouth every 6 (six) hours as needed for allergies (bee stings).  [provider]  Active Self  ezetimibe (ZETIA) 10 MG tablet [id number] Yes Take 1 tablet (10 mg total) by mouth daily. Denise, [name], Macdonald  Active   losartan -hydrochlorothiazide (HYZAAR) 100-25 MG tablet [id number]  Take 1 tablet by mouth daily. Denise, [name], Macdonald  Active   meloxicam (MOBIC) 15 MG tablet [id number]  Take 1 tablet (15 mg total) by mouth daily. Denise, [name], Macdonald  Active   metoprolol  tartrate (LOPRESSOR ) 25 MG tablet [id number]  Take 0.5 tablets (12.5 mg total) by mouth 2 (two) times daily. (Needs to be seen before next refill) Denise Mustard, Macdonald  Active   omeprazole  (PRILOSEC) 20 MG capsule [id number]  Take 1 capsule (20 mg total) by mouth daily. Denise Mustard, Macdonald  Active   Probiotic Product (PROBIOTIC PO) [id number]  Take by mouth. [provider]  Active   rivaroxaban  (XARELTO ) 20 MG TABS tablet [id number]  Take 1 tablet (20 mg total) by mouth daily with supper. Denise Mustard, Macdonald  Active               Assessment/Plan:   Hyperlipidemia/ASCVD Risk Reduction: - Currently uncontrolled.  - Reviewed long term  complications of uncontrolled cholesterol - Reviewed dietary recommendations including FOLLOWING A HEART HEALTHY DIET/HEALTHY PLATE METHOD - Recommend to start ezetimibe with diet and lifestyle changes -consider Nexletol vs Repatha if goal LDL not achieved by 03/2024 PCP appt -lipid panel ordered for f/u PCP appt   Follow Up Plan: 03/2024  [name], PharmD, BCACP, CPP Clinical Pharmacist, [facility]

## 2023-11-17 ENCOUNTER — Other Ambulatory Visit

## 2023-11-18 ENCOUNTER — Other Ambulatory Visit: Payer: Self-pay

## 2023-11-18 DIAGNOSIS — M79672 Pain in left foot: Secondary | ICD-10-CM

## 2023-11-19 ENCOUNTER — Ambulatory Visit: Payer: Self-pay | Admitting: Nurse Practitioner

## 2023-11-19 ENCOUNTER — Other Ambulatory Visit (INDEPENDENT_AMBULATORY_CARE_PROVIDER_SITE_OTHER)

## 2023-11-19 DIAGNOSIS — M79672 Pain in left foot: Secondary | ICD-10-CM

## 2023-11-19 DIAGNOSIS — M7732 Calcaneal spur, left foot: Secondary | ICD-10-CM | POA: Diagnosis not present

## 2023-12-06 ENCOUNTER — Other Ambulatory Visit (HOSPITAL_BASED_OUTPATIENT_CLINIC_OR_DEPARTMENT_OTHER): Payer: Self-pay

## 2023-12-06 MED ORDER — FLUZONE HIGH-DOSE 0.5 ML IM SUSY
0.5000 mL | PREFILLED_SYRINGE | Freq: Once | INTRAMUSCULAR | 0 refills | Status: AC
  Filled 2023-12-06: qty 0.5, 1d supply, fill #0

## 2023-12-28 ENCOUNTER — Inpatient Hospital Stay: Payer: PPO | Attending: Oncology

## 2023-12-28 DIAGNOSIS — E538 Deficiency of other specified B group vitamins: Secondary | ICD-10-CM

## 2023-12-28 DIAGNOSIS — Z7901 Long term (current) use of anticoagulants: Secondary | ICD-10-CM | POA: Insufficient documentation

## 2023-12-28 DIAGNOSIS — D510 Vitamin B12 deficiency anemia due to intrinsic factor deficiency: Secondary | ICD-10-CM | POA: Diagnosis not present

## 2023-12-28 DIAGNOSIS — D6851 Activated protein C resistance: Secondary | ICD-10-CM | POA: Diagnosis not present

## 2023-12-28 DIAGNOSIS — C49A3 Gastrointestinal stromal tumor of small intestine: Secondary | ICD-10-CM | POA: Diagnosis present

## 2023-12-28 LAB — COMPREHENSIVE METABOLIC PANEL WITH GFR
ALT: 24 U/L (ref 0–44)
AST: 27 U/L (ref 15–41)
Albumin: 4 g/dL (ref 3.5–5.0)
Alkaline Phosphatase: 75 U/L (ref 38–126)
Anion gap: 8 (ref 5–15)
BUN: 15 mg/dL (ref 8–23)
CO2: 28 mmol/L (ref 22–32)
Calcium: 9.4 mg/dL (ref 8.9–10.3)
Chloride: 104 mmol/L (ref 98–111)
Creatinine, Ser: 0.76 mg/dL (ref 0.44–1.00)
GFR, Estimated: >60 mL/min (ref >60)
Glucose, Bld: 90 mg/dL (ref 70–99)
Potassium: 4.4 mmol/L (ref 3.5–5.1)
Sodium: 140 mmol/L (ref 135–145)
Total Bilirubin: 0.4 mg/dL (ref 0.0–1.2)
Total Protein: 7.3 g/dL (ref 6.5–8.1)

## 2023-12-28 LAB — CBC WITH DIFFERENTIAL/PLATELET
Abs Immature Granulocytes: 0.03 K/uL (ref 0.00–0.07)
Basophils Absolute: 0 K/uL (ref 0.0–0.1)
Basophils Relative: 0 %
Eosinophils Absolute: 0.1 K/uL (ref 0.0–0.5)
Eosinophils Relative: 2 %
HCT: 43.8 % (ref 36.0–46.0)
Hemoglobin: 14 g/dL (ref 12.0–15.0)
Immature Granulocytes: 1 %
Lymphocytes Relative: 45 %
Lymphs Abs: 2.7 K/uL (ref 0.7–4.0)
MCH: 29.2 pg (ref 26.0–34.0)
MCHC: 32 g/dL (ref 30.0–36.0)
MCV: 91.4 fL (ref 80.0–100.0)
Monocytes Absolute: 0.7 K/uL (ref 0.1–1.0)
Monocytes Relative: 12 %
Neutro Abs: 2.4 K/uL (ref 1.7–7.7)
Neutrophils Relative %: 40 %
Platelets: 309 K/uL (ref 150–400)
RBC: 4.79 MIL/uL (ref 3.87–5.11)
RDW: 13 % (ref 11.5–15.5)
WBC: 6 K/uL (ref 4.0–10.5)
nRBC: 0 % (ref 0.0–0.2)

## 2023-12-28 LAB — VITAMIN B12: Vitamin B-12: 628 pg/mL (ref 180–914)

## 2024-01-04 ENCOUNTER — Inpatient Hospital Stay: Payer: PPO | Admitting: Oncology

## 2024-01-04 VITALS — BP 125/51 | HR 60 | Temp 97.7°F | Resp 18 | Ht 67.5 in | Wt 240.0 lb

## 2024-01-04 DIAGNOSIS — D510 Vitamin B12 deficiency anemia due to intrinsic factor deficiency: Secondary | ICD-10-CM

## 2024-01-04 DIAGNOSIS — Z7901 Long term (current) use of anticoagulants: Secondary | ICD-10-CM

## 2024-01-04 DIAGNOSIS — C49A3 Gastrointestinal stromal tumor of small intestine: Secondary | ICD-10-CM | POA: Insufficient documentation

## 2024-01-04 MED ORDER — CYANOCOBALAMIN 1000 MCG/ML IJ SOLN: INTRAMUSCULAR | 11 refills | Status: AC

## 2024-01-04 MED ORDER — RIVAROXABAN 20 MG PO TABS: 20.0000 mg | ORAL_TABLET | Freq: Every day | ORAL | 5 refills | Status: AC

## 2024-01-04 NOTE — Assessment & Plan Note (Addendum)
-   Continue Xarelto  indefinitely.  No bleeding issues reported.

## 2024-01-04 NOTE — Assessment & Plan Note (Addendum)
-   Continue B12 injections every 4 weeks.  B12 level is normal at 628.

## 2024-01-04 NOTE — Progress Notes (Signed)
 [name] OFFICE PROGRESS NOTE  [name], FNP  ASSESSMENT & PLAN:    Assessment & Plan GIST (gastrointestinal stromal tumor) of small bowel, malignant ([name]) - Initial presentation is with GI bleed.  She denies any change in bowel habits or bleeding at this time. - Possible small umbilical hernia x 6 months.   -We discussed CT abdomen/pelvis given she has not had one since 2018.  Patient was agreeable. -Will call with results. -RTC 1 year for follow-up. Chronic anticoagulation - Continue Xarelto  indefinitely.  No bleeding issues reported.  Pernicious anemia - Continue B12 injections every 4 weeks.  B12 level is normal at 628.  Orders Placed This Encounter  Procedures   CT ABDOMEN PELVIS W CONTRAST    Standing Status:   Future    Expected Date:   01/18/2024    Expiration Date:   01/03/2025    If indicated for the ordered procedure, I authorize the administration of contrast media per Radiology protocol:   Yes    Does the patient have a contrast media/X-ray dye allergy?:   No    Preferred imaging location?:   [facility]    If indicated for the ordered procedure, I authorize the administration of oral contrast media per Radiology protocol:   Yes    INTERVAL HISTORY: Patient returns for follow-up for chronic anticoagulation, history of GIST small bowel tumor and pernicious anemia.  Patient receives monthly B12 injections.  She has been on Xarelto  and denies any bleeding.  Reports she noticed a bulge right at her bellybutton during the summer and mention it to her primary care doctor who thought it was likely due to a hernia but wanted her to mention it to us .  Patient reports that this is close to her incision from past surgery for GIST tumor.  Reports some days it is more bothersome than others.  It is not painful but more annoying.  Denies any changes to her stools.  We reviewed CBC, CMP, B12.  SUMMARY OF HEMATOLOGIC HISTORY: Oncology  History  GIST (gastrointestinal stromal tumor), malignant ([name])  01/03/2011 Initial Diagnosis   GIST (gastrointestinal stromal tumor), malignant ([name])   01/03/2011 Imaging   CT abd/pelvis- 5.0 cm soft tissue mass associated with distal small bowel. Findings characteristic of gastrointestinal stromal tumor. Adenocarcinoma or malignant transformation are not excluded.   Stable left adrenal adenoma.   04/03/2011 Imaging   CT abd/pelvis- Small bowel obstruction at small bowel anastomosis. Single loop of small bowel in the left mid abdomen shows wall thickening, nonspecific; this can be seen with infection, inflammatory bowel disease and ischemia. Free intraperitoneal fluid. Small right pleural effusion and bibasilar atelectasis. Left adrenal adenoma.   01/25/2012 Imaging   CT abd/pelvis- Prior small bowel resection with anastomoses in the left mid abdomen.  No evidence of metastatic disease in the abdomen/pelvis.   No evidence of bowel obstruction.   Focal eccentric wall thickening along the lateral aspect of the cecum.  Colonoscopy is suggested to exclude a primary colonic neoplasm.     01/25/2013 Imaging   CT abd/pelvis- 1. Thickening through the gastric cardiac region likely represents redundant folds of normal mucosa. Recommend attention on follow-up. 2. Mild haziness to the central mesentery is similar to and prior likely related to prior bowel surgery. No evidence of GIST recurrence within the small bowel. 3. No evidence of adenopathy or mass in the abdomen or pelvis. 4. Stable left adrenal adenoma. 5. 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   01/15/2014 Imaging   CT abd/pelvis- Stable exam. No evidence for recurrent disease in the abdomen or pelvis.   Stable left adrenal adenoma.   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   01/10/2015 Imaging   CT abd/pelvis- 1. Stable exam. No  evidence for recurrent disease within the abdomen or pelvis. 2. Stable left adrenal gland adenoma. 3. 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   01/23/2015 Imaging   CT abd/pelvis- Small bowel obstruction with a transition zone just distal to the ileo ileal anastomosis in the anterior pelvis and secondary to peritoneal adhesions.    1.  T3N0 Gist of small bowel: -Status post resection on 01/05/2011, pathology showing 5.8 cm, 0/8 lymph nodes involved, margins negative, 1 mitosis/50 HPF, intermediate grade.  Presentation was with GI bleed. -Adjuvant imatinib  complicated by cardiac arrhythmias. -Last CT CAP dated 01/31/2017 did not show any evidence of metastatic disease.   2.  Recurrent DVT: -She was on warfarin for 32 years and was switched to Xarelto  in August 2018. - She also has factor V Leiden heterozygosity..   3.  Pernicious anemia: -She is on B12 injections monthly.   4.  Health maintenance: -Mammogram on 05/18/2019 was BI-RADS Category 1.      CBC    Component Value Date/Time   WBC 6.0 12/28/2023 1001   RBC 4.79 12/28/2023 1001   HGB 14.0 12/28/2023 1001   HGB 14.3 10/05/2023 0901   HCT 43.8 12/28/2023 1001   HCT 44.4 10/05/2023 0901   PLT 309 12/28/2023 1001   PLT 323 10/05/2023 0901   MCV 91.4 12/28/2023 1001   MCV 90 10/05/2023 0901   MCH 29.2 12/28/2023 1001   MCHC 32.0 12/28/2023 1001   RDW 13.0 12/28/2023 1001   RDW 13.1 10/05/2023 0901   LYMPHSABS 2.7 12/28/2023 1001   LYMPHSABS 1.9 10/05/2023 0901   MONOABS 0.7 12/28/2023 1001   EOSABS 0.1 12/28/2023 1001   EOSABS 0.1 10/05/2023 0901   BASOSABS 0.0 12/28/2023 1001   BASOSABS 0.0 10/05/2023 0901       Latest Ref Rng & Units 12/28/2023   10:01 AM 10/05/2023    9:01 AM 04/06/2023    8:51 AM  CMP  Glucose 70 - 99 mg/dL 90  98  84   BUN 8 - 23 mg/dL 15  15  16    Creatinine 0.44 - 1.00 mg/dL 9.23  9.13  9.13   Sodium 135 - 145 mmol/L 140  140  141   Potassium 3.5 - 5.1 mmol/L 4.4  4.6  3.8    Chloride 98 - 111 mmol/L 104  102  102   CO2 22 - 32 mmol/L 28  22  25    Calcium  8.9 - 10.3 mg/dL 9.4  9.8  9.8   Total Protein 6.5 - 8.1 g/dL 7.3  7.4  7.0   Total Bilirubin 0.0 - 1.2 mg/dL 0.4  0.4  0.4   Alkaline Phos 38 - 126 U/L 75  85  81   AST 15 - 41 U/L 27  22  23    ALT 0 - 44 U/L 24  23  23       Lab Results  Component Value Date   FERRITIN 68 12/23/2020   VITAMINB12 628 12/28/2023    Vitals:   01/04/24 0905  BP: (!) 125/51  Pulse: 60  Resp: 18  Temp: 97.7 F (36.5 C)  SpO2: 99%  Review of System:  Review of Systems  Constitutional:  Positive for malaise/fatigue.  Gastrointestinal:        Possible umbilical hernia    Physical Exam: Physical Exam Constitutional:      Appearance: Normal appearance.  HENT:     Head: Normocephalic and atraumatic.  Eyes:     Pupils: Pupils are equal, round, and reactive to light.  Cardiovascular:     Rate and Rhythm: Normal rate and regular rhythm.     Heart sounds: Normal heart sounds. No murmur heard. Pulmonary:     Effort: Pulmonary effort is normal.     Breath sounds: Normal breath sounds. No wheezing.  Abdominal:     General: Bowel sounds are normal. There is no distension.     Palpations: Abdomen is soft.     Tenderness: There is no abdominal tenderness.     Hernia: A hernia is present. Hernia is present in the umbilical area.     Comments: Small umbilical hernia  Musculoskeletal:        General: Normal range of motion.     Cervical back: Normal range of motion.  Skin:    General: Skin is warm and dry.     Findings: No rash.  Neurological:     Mental Status: She is alert and oriented to person, place, and time.     Gait: Gait is intact.  Psychiatric:        Mood and Affect: Mood and affect normal.        Cognition and Memory: Memory normal.        Judgment: Judgment normal.      I spent 20 minutes dedicated to the care of this patient (face-to-face and non-face-to-face) on the date of the encounter  to include what is described in the assessment and plan.,  Delon Hope, NP 01/04/2024 9:29 AM

## 2024-01-04 NOTE — Assessment & Plan Note (Addendum)
-   Initial presentation is with GI bleed.  She denies any change in bowel habits or bleeding at this time. - Possible small umbilical hernia x 6 months.   -We discussed CT abdomen/pelvis given she has not had one since 2018.  Patient was agreeable. -Will call with results. -RTC 1 year for follow-up.

## 2024-01-10 ENCOUNTER — Ambulatory Visit: Admitting: Nurse Practitioner

## 2024-01-24 ENCOUNTER — Encounter: Payer: Self-pay | Admitting: *Deleted

## 2024-01-24 ENCOUNTER — Ambulatory Visit (HOSPITAL_COMMUNITY)
Admission: RE | Admit: 2024-01-24 | Discharge: 2024-01-24 | Disposition: A | Discharge status: Home / Self Care | Source: Ambulatory Visit | Attending: Oncology | Admitting: Oncology

## 2024-01-24 DIAGNOSIS — C49A3 Gastrointestinal stromal tumor of small intestine: Secondary | ICD-10-CM | POA: Diagnosis present

## 2024-01-24 MED ORDER — IOHEXOL 300 MG/ML  SOLN
100.0000 mL | Freq: Once | INTRAMUSCULAR | Status: AC | PRN
  Administered 2024-01-24: 100 mL via INTRAVENOUS

## 2024-01-28 ENCOUNTER — Inpatient Hospital Stay: Admitting: Oncology

## 2024-01-28 NOTE — Assessment & Plan Note (Addendum)
-   Continue Xarelto  indefinitely.  No bleeding issues reported.

## 2024-01-28 NOTE — Progress Notes (Unsigned)
 "  [name] OFFICE PROGRESS NOTE  [name], FNP  ASSESSMENT & PLAN:    Assessment & Plan Chronic anticoagulation - Continue Xarelto  indefinitely.  No bleeding issues reported.  GIST (gastrointestinal stromal tumor) of small bowel, malignant ([name]) - Initial presentation is with GI bleed.  She denies any change in bowel habits or bleeding at this time. - Possible small umbilical hernia x 6 months.   -We discussed CT abdomen/pelvis given she has not had one since 2018.  Patient was agreeable. -Will call with results. -RTC 1 year for follow-up.  No orders of the defined types were placed in this encounter.   INTERVAL HISTORY: Patient returns for follow-up for chronic anticoagulation, history of GIST small bowel tumor and pernicious anemia.  Patient receives monthly B12 injections.  She has been on Xarelto  and denies any bleeding.  Reports she noticed a bulge right at her bellybutton during the summer and mention it to her primary care doctor who thought it was likely due to a hernia but wanted her to mention it to us .  Patient reports that this is close to her incision from past surgery for GIST tumor.  Reports some days it is more bothersome than others.  It is not painful but more annoying.  Denies any changes to her stools.  We reviewed CBC, CMP, B12.  SUMMARY OF HEMATOLOGIC HISTORY: Oncology History  GIST (gastrointestinal stromal tumor), malignant ([name])  01/03/2011 Initial Diagnosis   GIST (gastrointestinal stromal tumor), malignant ([name])   01/03/2011 Imaging   CT abd/pelvis- 5.0 cm soft tissue mass associated with distal small bowel. Findings characteristic of gastrointestinal stromal tumor. Adenocarcinoma or malignant transformation are not excluded.   Stable left adrenal adenoma.   04/03/2011 Imaging   CT abd/pelvis- Small bowel obstruction at small bowel anastomosis. Single loop of small bowel in the left mid abdomen shows wall thickening,  nonspecific; this can be seen with infection, inflammatory bowel disease and ischemia. Free intraperitoneal fluid. Small right pleural effusion and bibasilar atelectasis. Left adrenal adenoma.   01/25/2012 Imaging   CT abd/pelvis- Prior small bowel resection with anastomoses in the left mid abdomen.  No evidence of metastatic disease in the abdomen/pelvis.   No evidence of bowel obstruction.   Focal eccentric wall thickening along the lateral aspect of the cecum.  Colonoscopy is suggested to exclude a primary colonic neoplasm.     01/25/2013 Imaging   CT abd/pelvis- 1. Thickening through the gastric cardiac region likely represents redundant folds of normal mucosa. Recommend attention on follow-up. 2. Mild haziness to the central mesentery is similar to and prior likely related to prior bowel surgery. No evidence of GIST recurrence within the small bowel. 3. No evidence of adenopathy or mass in the abdomen or pelvis. 4. Stable left adrenal adenoma. 5. 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   01/15/2014 Imaging   CT abd/pelvis- Stable exam. No evidence for recurrent disease in the abdomen or pelvis.   Stable left adrenal adenoma.   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   01/10/2015 Imaging   CT abd/pelvis- 1. Stable exam. No evidence for recurrent disease within the abdomen or pelvis. 2. Stable left adrenal gland adenoma. 3. 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   01/23/2015 Imaging   CT abd/pelvis- Small bowel obstruction with a transition zone just distal to the ileo ileal anastomosis in the anterior pelvis and secondary to peritoneal  adhesions.    1.  T3N0 Gist of small bowel: -Status post resection on 01/05/2011, pathology showing 5.8 cm, 0/8 lymph nodes involved, margins negative, 1 mitosis/50 HPF, intermediate grade.  Presentation was with GI bleed. -Adjuvant  imatinib  complicated by cardiac arrhythmias. -Last CT CAP dated 01/31/2017 did not show any evidence of metastatic disease.   2.  Recurrent DVT: -She was on warfarin for 32 years and was switched to Xarelto  in August 2018. - She also has factor V Leiden heterozygosity..   3.  Pernicious anemia: -She is on B12 injections monthly.   4.  Health maintenance: -Mammogram on 05/18/2019 was BI-RADS Category 1.      CBC    Component Value Date/Time   WBC 6.0 12/28/2023 1001   RBC 4.79 12/28/2023 1001   HGB 14.0 12/28/2023 1001   HGB 14.3 10/05/2023 0901   HCT 43.8 12/28/2023 1001   HCT 44.4 10/05/2023 0901   PLT 309 12/28/2023 1001   PLT 323 10/05/2023 0901   MCV 91.4 12/28/2023 1001   MCV 90 10/05/2023 0901   MCH 29.2 12/28/2023 1001   MCHC 32.0 12/28/2023 1001   RDW 13.0 12/28/2023 1001   RDW 13.1 10/05/2023 0901   LYMPHSABS 2.7 12/28/2023 1001   LYMPHSABS 1.9 10/05/2023 0901   MONOABS 0.7 12/28/2023 1001   EOSABS 0.1 12/28/2023 1001   EOSABS 0.1 10/05/2023 0901   BASOSABS 0.0 12/28/2023 1001   BASOSABS 0.0 10/05/2023 0901       Latest Ref Rng & Units 12/28/2023   10:01 AM 10/05/2023    9:01 AM 04/06/2023    8:51 AM  CMP  Glucose 70 - 99 mg/dL 90  98  84   BUN 8 - 23 mg/dL 15  15  16    Creatinine 0.44 - 1.00 mg/dL 9.23  9.13  9.13   Sodium 135 - 145 mmol/L 140  140  141   Potassium 3.5 - 5.1 mmol/L 4.4  4.6  3.8   Chloride 98 - 111 mmol/L 104  102  102   CO2 22 - 32 mmol/L 28  22  25    Calcium  8.9 - 10.3 mg/dL 9.4  9.8  9.8   Total Protein 6.5 - 8.1 g/dL 7.3  7.4  7.0   Total Bilirubin 0.0 - 1.2 mg/dL 0.4  0.4  0.4   Alkaline Phos 38 - 126 U/L 75  85  81   AST 15 - 41 U/L 27  22  23    ALT 0 - 44 U/L 24  23  23       Lab Results  Component Value Date   FERRITIN 68 12/23/2020   VITAMINB12 628 12/28/2023    There were no vitals filed for this visit.   Review of System:  Review of Systems  Constitutional:  Positive for malaise/fatigue.  Gastrointestinal:         Possible umbilical hernia    Physical Exam: Physical Exam Constitutional:      Appearance: Normal appearance.  HENT:     Head: Normocephalic and atraumatic.  Eyes:     Pupils: Pupils are equal, round, and reactive to light.  Cardiovascular:     Rate and Rhythm: Normal rate and regular rhythm.     Heart sounds: Normal heart sounds. No murmur heard. Pulmonary:     Effort: Pulmonary effort is normal.     Breath sounds: Normal breath sounds. No wheezing.  Abdominal:     General: Bowel sounds are normal. There is no distension.  Palpations: Abdomen is soft.     Tenderness: There is no abdominal tenderness.     Hernia: A hernia is present. Hernia is present in the umbilical area.     Comments: Small umbilical hernia  Musculoskeletal:        General: Normal range of motion.     Cervical back: Normal range of motion.  Skin:    General: Skin is warm and dry.     Findings: No rash.  Neurological:     Mental Status: She is alert and oriented to person, place, and time.     Gait: Gait is intact.  Psychiatric:        Mood and Affect: Mood and affect normal.        Cognition and Memory: Memory normal.        Judgment: Judgment normal.      I spent 20 minutes dedicated to the care of this patient (face-to-face and non-face-to-face) on the date of the encounter to include what is described in the assessment and plan.,  Delon Hope, NP 01/28/2024 11:21 AM "

## 2024-01-28 NOTE — Assessment & Plan Note (Addendum)
-   Initial presentation is with GI bleed.  She denies any change in bowel habits or bleeding at this time. - Possible small umbilical hernia x 6 months.   -We discussed CT abdomen/pelvis given she has not had one since 2018.  Patient was agreeable. -Will call with results. -RTC 1 year for follow-up.

## 2024-02-02 ENCOUNTER — Inpatient Hospital Stay: Attending: Oncology | Admitting: Oncology

## 2024-02-02 DIAGNOSIS — D510 Vitamin B12 deficiency anemia due to intrinsic factor deficiency: Secondary | ICD-10-CM

## 2024-02-02 DIAGNOSIS — Z86718 Personal history of other venous thrombosis and embolism: Secondary | ICD-10-CM | POA: Diagnosis not present

## 2024-02-02 DIAGNOSIS — Z7901 Long term (current) use of anticoagulants: Secondary | ICD-10-CM

## 2024-02-02 DIAGNOSIS — C49A3 Gastrointestinal stromal tumor of small intestine: Secondary | ICD-10-CM | POA: Diagnosis not present

## 2024-02-02 NOTE — Assessment & Plan Note (Addendum)
-   Continue B12 injections every 4 weeks.  B12 level is normal at 628.

## 2024-02-02 NOTE — Assessment & Plan Note (Signed)
 Most recent CT scan from 01/24/2024 shows no evidence of GIST tumor recurrence in the stomach, no evidence of GIST tumor metastatic disease and benign left adrenal adenoma unchanged from multiple comparison exams. We reviewed labs from 12/28/2023 which showed unremarkable CBC and CMP. Recommend follow-up in 6 months with lab only and in 1 year with labs and CT scan.  Orders placed.

## 2024-02-02 NOTE — Progress Notes (Signed)
 "  [name] OFFICE PROGRESS NOTE  [name], FNP  ASSESSMENT & PLAN:   I connected with [name] on 02/02/2024 at  1:45 PM EST by telephone visit and verified that I am speaking with the correct person using two identifiers.   I discussed the limitations, risks, security and privacy concerns of performing an evaluation and management service by telemedicine and the availability of in-person appointments. I also discussed with the patient that there may be a patient responsible charge related to this service. The patient expressed understanding and agreed to proceed.   Other persons participating in the visit and their role in the encounter: NP, patient   Patients location: Home  Providers location: Clinic   Assessment & Plan Pernicious anemia - Continue B12 injections every 4 weeks.  B12 level is normal at 628. Chronic anticoagulation - Continue Xarelto  indefinitely.  No bleeding issues reported.  GIST (gastrointestinal stromal tumor) of small bowel, malignant ([name]) Most recent CT scan from 01/24/2024 shows no evidence of GIST tumor recurrence in the stomach, no evidence of GIST tumor metastatic disease and benign left adrenal adenoma unchanged from multiple comparison exams. We reviewed labs from 12/28/2023 which showed unremarkable CBC and CMP. Recommend follow-up in 6 months with lab only and in 1 year with labs and CT scan.  Orders placed.  Orders Placed This Encounter  Procedures   CT ABDOMEN PELVIS W CONTRAST    Standing Status:   Future    Expected Date:   02/01/2025    Expiration Date:   05/02/2025    If indicated for the ordered procedure, I authorize the administration of contrast media per Radiology protocol:   Yes    Does the patient have a contrast media/X-ray dye allergy?:   No    Preferred imaging location?:   [facility]    If indicated for the ordered procedure, I authorize the administration of oral contrast media per Radiology  protocol:   Yes   Vitamin B12    Standing Status:   Future    Expected Date:   08/01/2024    Expiration Date:   10/30/2024   Comprehensive metabolic panel    Standing Status:   Future    Expected Date:   08/01/2024    Expiration Date:   10/30/2024   CBC with Differential    Standing Status:   Future    Expected Date:   08/01/2024    Expiration Date:   10/30/2024   Vitamin B12    Standing Status:   Future    Expected Date:   02/01/2025    Expiration Date:   05/02/2025   Comprehensive metabolic panel    Standing Status:   Future    Expected Date:   02/01/2025    Expiration Date:   05/02/2025   CBC with Differential    Standing Status:   Future    Expected Date:   02/01/2025    Expiration Date:   05/02/2025    INTERVAL HISTORY: Patient returns for follow-up for chronic anticoagulation, history of GIST small bowel tumor and pernicious anemia.  Patient receives monthly B12 injections.  [name] has been on Xarelto  and denies any bleeding.  Reports [name] noticed a bulge right at her bellybutton during the summer and mention it to her primary care doctor who thought it was likely due to a hernia but wanted her to mention it to us .  Patient reports that this is close to her incision from past  surgery for GIST tumor.  Reports some days it is more bothersome than others.  It is not painful but more annoying.  Denies any changes to her stools.  [name] is here to review her most recent CT scan.  We reviewed CBC, CMP, B12.  SUMMARY OF HEMATOLOGIC HISTORY: Oncology History  GIST (gastrointestinal stromal tumor), malignant ([name])  01/03/2011 Initial Diagnosis   GIST (gastrointestinal stromal tumor), malignant ([name])   01/03/2011 Imaging   CT abd/pelvis- 5.0 cm soft tissue mass associated with distal small bowel. Findings characteristic of gastrointestinal stromal tumor. Adenocarcinoma or malignant transformation are not excluded.   Stable left adrenal adenoma.   04/03/2011 Imaging   CT abd/pelvis- Small bowel  obstruction at small bowel anastomosis. Single loop of small bowel in the left mid abdomen shows wall thickening, nonspecific; this can be seen with infection, inflammatory bowel disease and ischemia. Free intraperitoneal fluid. Small right pleural effusion and bibasilar atelectasis. Left adrenal adenoma.   01/25/2012 Imaging   CT abd/pelvis- Prior small bowel resection with anastomoses in the left mid abdomen.  No evidence of metastatic disease in the abdomen/pelvis.   No evidence of bowel obstruction.   Focal eccentric wall thickening along the lateral aspect of the cecum.  Colonoscopy is suggested to exclude a primary colonic neoplasm.     01/25/2013 Imaging   CT abd/pelvis- 1. Thickening through the gastric cardiac region likely represents redundant folds of normal mucosa. Recommend attention on follow-up. 2. Mild haziness to the central mesentery is similar to and prior likely related to prior bowel surgery. No evidence of GIST recurrence within the small bowel. 3. No evidence of adenopathy or mass in the abdomen or pelvis. 4. Stable left adrenal adenoma. 5. 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   01/15/2014 Imaging   CT abd/pelvis- Stable exam. No evidence for recurrent disease in the abdomen or pelvis.   Stable left adrenal adenoma.   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   01/10/2015 Imaging   CT abd/pelvis- 1. Stable exam. No evidence for recurrent disease within the abdomen or pelvis. 2. Stable left adrenal gland adenoma. 3. 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   01/23/2015 Imaging   CT abd/pelvis- Small bowel obstruction with a transition zone just distal to the ileo ileal anastomosis in the anterior pelvis and secondary to peritoneal adhesions.    1.  T3N0 Gist of small bowel: -Status post resection on 01/05/2011, pathology showing 5.8 cm, 0/8 lymph nodes  involved, margins negative, 1 mitosis/50 HPF, intermediate grade.  Presentation was with GI bleed. -Adjuvant imatinib  complicated by cardiac arrhythmias. -Last CT CAP dated 01/31/2017 did not show any evidence of metastatic disease.   2.  Recurrent DVT: -[name] was on warfarin for 32 years and was switched to Xarelto  in August 2018. - [name] also has factor V Leiden heterozygosity..   3.  Pernicious anemia: -[name] is on B12 injections monthly.   4.  Health maintenance: -Mammogram on 05/18/2019 was BI-RADS Category 1.      CBC    Component Value Date/Time   WBC 6.0 12/28/2023 1001   RBC 4.79 12/28/2023 1001   HGB 14.0 12/28/2023 1001   HGB 14.3 10/05/2023 0901   HCT 43.8 12/28/2023 1001   HCT 44.4 10/05/2023 0901   PLT 309 12/28/2023 1001   PLT 323 10/05/2023 0901   MCV 91.4 12/28/2023 1001   MCV 90 10/05/2023 0901   MCH  29.2 12/28/2023 1001   MCHC 32.0 12/28/2023 1001   RDW 13.0 12/28/2023 1001   RDW 13.1 10/05/2023 0901   LYMPHSABS 2.7 12/28/2023 1001   LYMPHSABS 1.9 10/05/2023 0901   MONOABS 0.7 12/28/2023 1001   EOSABS 0.1 12/28/2023 1001   EOSABS 0.1 10/05/2023 0901   BASOSABS 0.0 12/28/2023 1001   BASOSABS 0.0 10/05/2023 0901       Latest Ref Rng & Units 12/28/2023   10:01 AM 10/05/2023    9:01 AM 04/06/2023    8:51 AM  CMP  Glucose 70 - 99 mg/dL 90  98  84   BUN 8 - 23 mg/dL 15  15  16    Creatinine 0.44 - 1.00 mg/dL 9.23  9.13  9.13   Sodium 135 - 145 mmol/L 140  140  141   Potassium 3.5 - 5.1 mmol/L 4.4  4.6  3.8   Chloride 98 - 111 mmol/L 104  102  102   CO2 22 - 32 mmol/L 28  22  25    Calcium  8.9 - 10.3 mg/dL 9.4  9.8  9.8   Total Protein 6.5 - 8.1 g/dL 7.3  7.4  7.0   Total Bilirubin 0.0 - 1.2 mg/dL 0.4  0.4  0.4   Alkaline Phos 38 - 126 U/L 75  85  81   AST 15 - 41 U/L 27  22  23    ALT 0 - 44 U/L 24  23  23       Lab Results  Component Value Date   FERRITIN 68 12/23/2020   VITAMINB12 628 12/28/2023    There were no vitals filed for this  visit.   Review of System:  Review of Systems  Constitutional:  Positive for malaise/fatigue.  Gastrointestinal:        Possible umbilical hernia    Physical Exam: Physical Exam Neurological:     Mental Status: [name] is alert and oriented to person, place, and time.     I provided 20 minutes of non face-to-face telephone visit time during this encounter, and > 50% was spent counseling as documented under my assessment & plan.   Delon Hope, NP 02/02/2024 3:17 PM "

## 2024-02-02 NOTE — Assessment & Plan Note (Addendum)
-   Continue Xarelto  indefinitely.  No bleeding issues reported.

## 2024-02-07 ENCOUNTER — Ambulatory Visit: Admitting: Family

## 2024-02-07 ENCOUNTER — Encounter: Payer: Self-pay | Admitting: Family

## 2024-02-07 VITALS — BP 118/76 | HR 64 | Temp 97.9°F | Ht 67.5 in | Wt 246.8 lb

## 2024-02-07 DIAGNOSIS — L239 Allergic contact dermatitis, unspecified cause: Secondary | ICD-10-CM

## 2024-02-07 MED ORDER — METHYLPREDNISOLONE ACETATE 80 MG/ML IJ SUSP
80.0000 mg | Freq: Once | INTRAMUSCULAR | Status: AC
  Administered 2024-02-07: 80 mg via INTRAMUSCULAR

## 2024-02-07 MED ORDER — CETIRIZINE HCL 10 MG PO TABS: 10.0000 mg | ORAL_TABLET | Freq: Every day | ORAL | 11 refills | Status: AC

## 2024-02-07 MED ORDER — PREDNISONE 10 MG (21) PO TBPK: ORAL_TABLET | ORAL | 0 refills | Status: AC

## 2024-02-07 NOTE — Patient Instructions (Signed)
 Hives Hives (urticaria) are itchy, red, swollen areas of skin. They can show up on any part of the body. They often fade within 24 hours (acute hives). If you get new hives after the old ones fade and the cycle goes on for many days or weeks, it is called chronic hives. Hives do not spread from person to person (are not contagious). Hives can happen when your body reacts to something you are allergic to (allergen) or to something that irritates your skin. When you are exposed to something that triggers hives, your body releases a chemical called histamine. This causes redness, itching, and swelling. Hives can show up right after you are exposed to a trigger or hours later. What are the causes? Hives may be caused by: Food allergies. Insect bites or stings. Allergies to pollen or pets. Spending time in sunlight, heat, or cold (exposure). Exercise. Stress. You can also get hives from other conditions and treatments. These include: Viruses, such as the common cold. Bacterial infections, such as urinary tract infections and strep throat. Certain medicines. Contact with latex or chemicals. Allergy shots. Blood transfusions. In some cases, the cause of hives is not known (idiopathic hives). What increases the risk? You are more likely to get hives if: You are female. You have food allergies. Hives are more common if you are allergic to citrus fruits, milk, eggs, peanuts, tree nuts, or shellfish. You are allergic to: Medicines. Latex. Insects. Animals. Pollen. What are the signs or symptoms? Common symptoms of hives include raised, itchy, red or white bumps or patches on your skin. These areas may: Become large and swollen (welts). Quickly change shape and location. This may happen more than once. Be separate hives or connect over a large area of skin. Sting or become painful. Turn white when pressed in the center (blanch). In severe cases, your hands, feet, and face may also become  swollen. This may happen if hives form deeper in your skin. How is this diagnosed? Hives may be diagnosed based on your symptoms, medical history, and a physical exam. You may have skin, pee (urine), or blood tests done. These can help find out what is causing your hives and rule out other health issues. You may also have a biopsy done. This is when a small piece of skin is removed for testing. How is this treated? Treatment for hives depends on the cause and on how severe your symptoms are. You may be told to use cool, wet cloths (cool compresses) or to take cool showers to relieve itching. Treatment may also include: Medicines to help: Relieve itching (antihistamines). Reduce swelling (corticosteroids). Treat infection (antibiotics). An injectable medicine called omalizumab. You may need this if you have chronic idiopathic hives and still have symptoms even after you are treated with antihistamines. In severe cases, you may need to use a device filled with medicine that gives an emergency shot of epinephrine (auto-injector pen) to prevent a very bad allergic reaction (anaphylactic reaction). Follow these instructions at home: Medicines Take and apply over-the-counter and prescription medicines only as told by your health care provider. If you were prescribed antibiotics, take them as told by your provider. Do not stop using the antibiotic even if you start to feel better. Skin care Apply cool compresses to the affected areas. Do not scratch or rub your skin. General instructions Do not take hot showers or baths. This can make itching worse. Do not wear tight-fitting clothing. Use sunscreen. Wear protective clothing when you are outside. Avoid  anything that causes your hives. Keep a journal to help track what causes your hives. Write down: What medicines you take. What you eat and drink. What products you use on your skin. Keep all follow-up visits. Your provider will track how well  treatment is working. Contact a health care provider if: Your symptoms do not get better with medicine. Your joints are painful or swollen. You have a fever. You have pain in your abdomen. Get help right away if: Your tongue, lips, or eyelids swell. Your chest or throat feels tight. You have trouble breathing or swallowing. These symptoms may be an emergency. Use the auto-injector pen right away. Then call 911. Do not wait to see if the symptoms will go away. Do not drive yourself to the hospital. This information is not intended to replace advice given to you by your health care provider. Make sure you discuss any questions you have with your health care provider. Document Revised: 10/09/2021 Document Reviewed: 09/30/2021 Elsevier Patient Education  2024 [employer].

## 2024-02-07 NOTE — Progress Notes (Signed)
 "  Subjective:    Patient ID: [name], female    DOB: [birth year], 74 y.o.   MRN: [id number]  Chief Complaint  Patient presents with   Rash    All over started Saturday. Has taken benadryl  seems to help. She went to urgent care yesterday. They told her they did not know what it was. Patient states the rash will come back once the Benadryl  runs off.    Pt presents to the office today with a rash right arm, trunk, and chest that started Saturday. She has been taking benadryl  with mild relief.   Denies any recent tick bite.  Rash This is a new problem. The current episode started in the past 7 days. The problem is unchanged. The affected locations include the chest, torso, abdomen, left arm and right arm. The rash is characterized by itchiness and redness. She was exposed to nothing. Pertinent negatives include no congestion, cough, eye pain, facial edema, fatigue, fever, shortness of breath or sore throat. Past treatments include antihistamine. The treatment provided moderate relief.      Review of Systems  Constitutional: Negative.  Negative for fatigue and fever.  HENT: Negative.  Negative for congestion and sore throat.   Eyes: Negative.  Negative for pain.  Respiratory: Negative.  Negative for cough and shortness of breath.   Cardiovascular: Negative.  Negative for palpitations.  Gastrointestinal: Negative.   Endocrine: Negative.   Genitourinary: Negative.   Musculoskeletal: Negative.   Skin:  Positive for rash.  Neurological: Negative.  Negative for headaches.  Hematological: Negative.   Psychiatric/Behavioral: Negative.    All other systems reviewed and are negative.   Social History   Socioeconomic History   Marital status: Married    Spouse name: Lynwood   Number of children: 3   Years of education: Not on file   Highest education level: Not on file  Occupational History    Employer: [employer]   Occupation: CNA    Employer: [employer]  Tobacco Use    Smoking status: Never   Smokeless tobacco: Never  Vaping Use   Vaping status: Never Used  Substance and Sexual Activity   Alcohol  use: No   Drug use: No   Sexual activity: Not Currently  Other Topics Concern   Not on file  Social History Narrative   Married x 53 years.   2 children living. Youngest son died in MVA.   2 granddaughters   Social Drivers of Health   Tobacco Use: Low Risk (02/07/2024)   Patient History    Smoking Tobacco Use: Never    Smokeless Tobacco Use: Never    Passive Exposure: Not on file  Financial Resource Strain: Low Risk (04/29/2022)   Overall Financial Resource Strain (CARDIA)    Difficulty of Paying Living Expenses: Not hard at all  Food Insecurity: No Food Insecurity (04/29/2022)   Hunger Vital Sign    Worried About Running Out of Food in the Last Year: Never true    Ran Out of Food in the Last Year: Never true  Transportation Needs: No Transportation Needs (04/29/2022)   PRAPARE - Administrator, Civil Service (Medical): No    Lack of Transportation (Non-Medical): No  Physical Activity: Inactive (04/29/2022)   Exercise Vital Sign    Days of Exercise per Week: 0 days    Minutes of Exercise per Session: 0 min  Stress: No Stress Concern Present (04/29/2022)   [employer] of Occupational Health - Occupational Stress Questionnaire  Feeling of Stress : Not at all  Social Connections: Socially Integrated (04/29/2022)   Social Connection and Isolation Panel    Frequency of Communication with Friends and Family: More than three times a week    Frequency of Social Gatherings with Friends and Family: More than three times a week    Attends Religious Services: More than 4 times per year    Active Member of Clubs or Organizations: Yes    Attends Banker Meetings: More than 4 times per year    Marital Status: Married  Depression (PHQ2-9): Low Risk (02/02/2024)   Depression (PHQ2-9)    PHQ-2 Score: 0  Alcohol  Screen: Low Risk (04/29/2022)    Alcohol  Screen    Last Alcohol  Screening Score (AUDIT): 0  Housing: Low Risk (04/29/2022)   Housing    Last Housing Risk Score: 0  Utilities: Not At Risk (04/29/2022)   AHC Utilities    Threatened with loss of utilities: No  Health Literacy: Not on file   Family History  Problem Relation Age of Onset   Depression Mother    Parkinson's disease Mother    Allergic rhinitis Brother    Angioedema Neg Hx    Asthma Neg Hx    Eczema Neg Hx    Immunodeficiency Neg Hx    Urticaria Neg Hx         Objective:   Physical Exam Vitals reviewed.  Constitutional:      General: She is not in acute distress.    Appearance: She is well-developed.  HENT:     Head: Normocephalic and atraumatic.     Right Ear: Tympanic membrane normal.     Left Ear: Tympanic membrane normal.  Eyes:     Pupils: Pupils are equal, round, and reactive to light.  Neck:     Thyroid : No thyromegaly.  Cardiovascular:     Rate and Rhythm: Normal rate and regular rhythm.     Heart sounds: Normal heart sounds. No murmur heard. Pulmonary:     Effort: Pulmonary effort is normal. No respiratory distress.     Breath sounds: Normal breath sounds. No wheezing.  Abdominal:     General: Bowel sounds are normal. There is no distension.     Palpations: Abdomen is soft.     Tenderness: There is no abdominal tenderness.  Musculoskeletal:        General: No tenderness. Normal range of motion.     Cervical back: Normal range of motion and neck supple.  Skin:    General: Skin is warm and dry.     Findings: Rash present. No erythema.         Comments: Erythemas  and hive like rash on bilateral arms, abdomen and chest  Neurological:     Mental Status: She is alert and oriented to person, place, and time.     Cranial Nerves: No cranial nerve deficit.     Deep Tendon Reflexes: Reflexes are normal and symmetric.  Psychiatric:        Behavior: Behavior normal.        Thought Content: Thought content normal.        Judgment:  Judgment normal.       BP 118/76   Pulse 64   Temp 97.9 F (36.6 C) (Temporal)   Ht 5' 7.5 (1.715 m)   Wt 246 lb 12.8 oz (111.9 kg)   SpO2 96%   BMI 38.08 kg/m      Assessment & Plan:  Denise CROME  Naff comes in today with chief complaint of Rash (All over started Saturday. Has taken benadryl  seems to help. She went to urgent care yesterday. They told her they did not know what it was. Patient states the rash will come back once the Benadryl  runs off. )   Diagnosis and orders addressed:  1. Allergic contact dermatitis, unspecified trigger (Primary) Start daily zyrtec , continue benadryl  as needed  Steroid injection given in office, start oral prednisone  tomorrow Avoid any irritants  Follow up if symptoms worsen or do not improve  - predniSONE  (STERAPRED UNI-PAK 21 TAB) 10 MG (21) TBPK tablet; Use as directed  Dispense: 21 tablet; Refill: 0 - methylPREDNISolone  acetate (DEPO-MEDROL ) injection 80 mg - cetirizine  (ZYRTEC ) 10 MG tablet; Take 1 tablet (10 mg total) by mouth daily.  Dispense: 30 tablet; Refill: 11     Bari Learn, FNP   "

## 2024-03-13 ENCOUNTER — Ambulatory Visit

## 2024-03-30 ENCOUNTER — Ambulatory Visit: Payer: Self-pay | Admitting: Nurse Practitioner

## 2024-03-31 ENCOUNTER — Ambulatory Visit: Admitting: Allergy & Immunology

## 2024-08-01 ENCOUNTER — Inpatient Hospital Stay

## 2024-12-27 ENCOUNTER — Other Ambulatory Visit (HOSPITAL_COMMUNITY)

## 2024-12-27 ENCOUNTER — Inpatient Hospital Stay

## 2025-01-03 ENCOUNTER — Inpatient Hospital Stay: Admitting: Oncology
